# Patient Record
Sex: Male | Born: 1935 | Race: White | Hispanic: No | Marital: Married | Smoking: Former smoker
Health system: Southern US, Community
[De-identification: ages and names within clinical notes are randomized; demographics above are authoritative.]

## PROBLEM LIST (undated history)

## (undated) DIAGNOSIS — I340 Nonrheumatic mitral (valve) insufficiency: Secondary | ICD-10-CM

## (undated) DIAGNOSIS — I219 Acute myocardial infarction, unspecified: Secondary | ICD-10-CM

## (undated) DIAGNOSIS — E785 Hyperlipidemia, unspecified: Secondary | ICD-10-CM

## (undated) DIAGNOSIS — I255 Ischemic cardiomyopathy: Secondary | ICD-10-CM

## (undated) DIAGNOSIS — I2510 Atherosclerotic heart disease of native coronary artery without angina pectoris: Secondary | ICD-10-CM

## (undated) DIAGNOSIS — C9000 Multiple myeloma not having achieved remission: Secondary | ICD-10-CM

## (undated) DIAGNOSIS — E119 Type 2 diabetes mellitus without complications: Secondary | ICD-10-CM

## (undated) HISTORY — PX: AORTIC VALVE REPLACEMENT (AVR)/CORONARY ARTERY BYPASS GRAFTING (CABG): SHX5725

## (undated) HISTORY — PX: CORONARY ANGIOPLASTY WITH STENT PLACEMENT: SHX49

---

## 2005-03-04 ENCOUNTER — Encounter: Payer: Self-pay | Admitting: Cardiology

## 2005-03-29 ENCOUNTER — Encounter: Payer: Self-pay | Admitting: Cardiology

## 2005-04-29 ENCOUNTER — Encounter: Payer: Self-pay | Admitting: Cardiology

## 2005-05-29 ENCOUNTER — Other Ambulatory Visit: Payer: Self-pay

## 2005-05-29 ENCOUNTER — Emergency Department: Payer: Self-pay | Admitting: Emergency Medicine

## 2005-05-29 ENCOUNTER — Encounter: Payer: Self-pay | Admitting: Cardiology

## 2005-05-31 ENCOUNTER — Ambulatory Visit: Payer: Self-pay | Admitting: Internal Medicine

## 2005-06-04 ENCOUNTER — Ambulatory Visit: Payer: Self-pay | Admitting: Cardiology

## 2005-06-04 ENCOUNTER — Other Ambulatory Visit: Payer: Self-pay

## 2005-06-05 ENCOUNTER — Other Ambulatory Visit: Payer: Self-pay

## 2005-06-25 ENCOUNTER — Ambulatory Visit: Payer: Self-pay | Admitting: Internal Medicine

## 2005-06-29 ENCOUNTER — Encounter: Payer: Self-pay | Admitting: Cardiology

## 2005-08-06 ENCOUNTER — Ambulatory Visit (HOSPITAL_COMMUNITY): Admission: RE | Admit: 2005-08-06 | Discharge: 2005-08-07 | Payer: Self-pay | Admitting: Orthopaedic Surgery

## 2005-11-30 ENCOUNTER — Encounter: Payer: Self-pay | Admitting: Internal Medicine

## 2005-12-30 ENCOUNTER — Encounter: Payer: Self-pay | Admitting: Internal Medicine

## 2006-01-27 ENCOUNTER — Encounter: Payer: Self-pay | Admitting: Internal Medicine

## 2006-02-27 ENCOUNTER — Encounter: Payer: Self-pay | Admitting: Internal Medicine

## 2006-03-29 ENCOUNTER — Encounter: Payer: Self-pay | Admitting: Internal Medicine

## 2006-04-29 ENCOUNTER — Encounter: Payer: Self-pay | Admitting: Internal Medicine

## 2007-01-21 IMAGING — CR DG CHEST 2V
1 series · 2 of 2 positions shown · non-contrast
Comparison: none

REASON FOR EXAM: Weakness
COMMENTS:

[Series 1: view not recorded · 0.17mm/px · 2 of 2 slices shown]
[im 1/2]
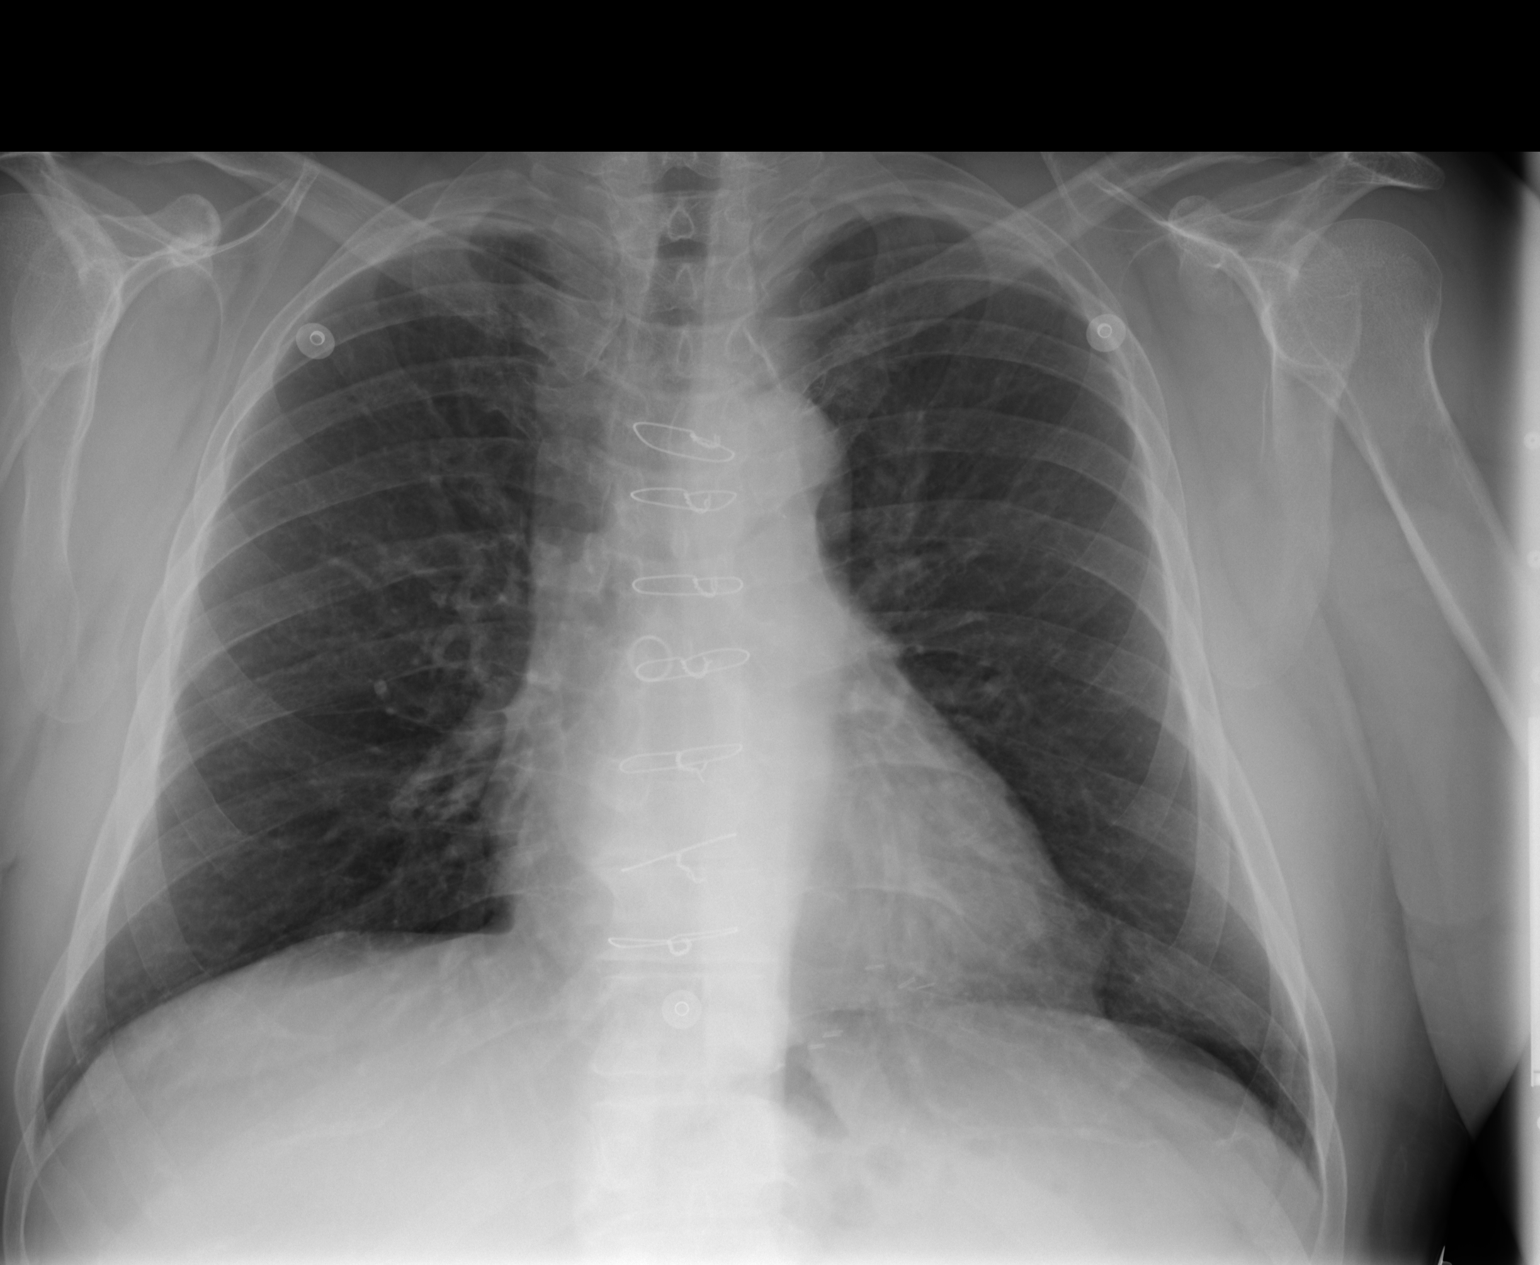
[im 2/2]
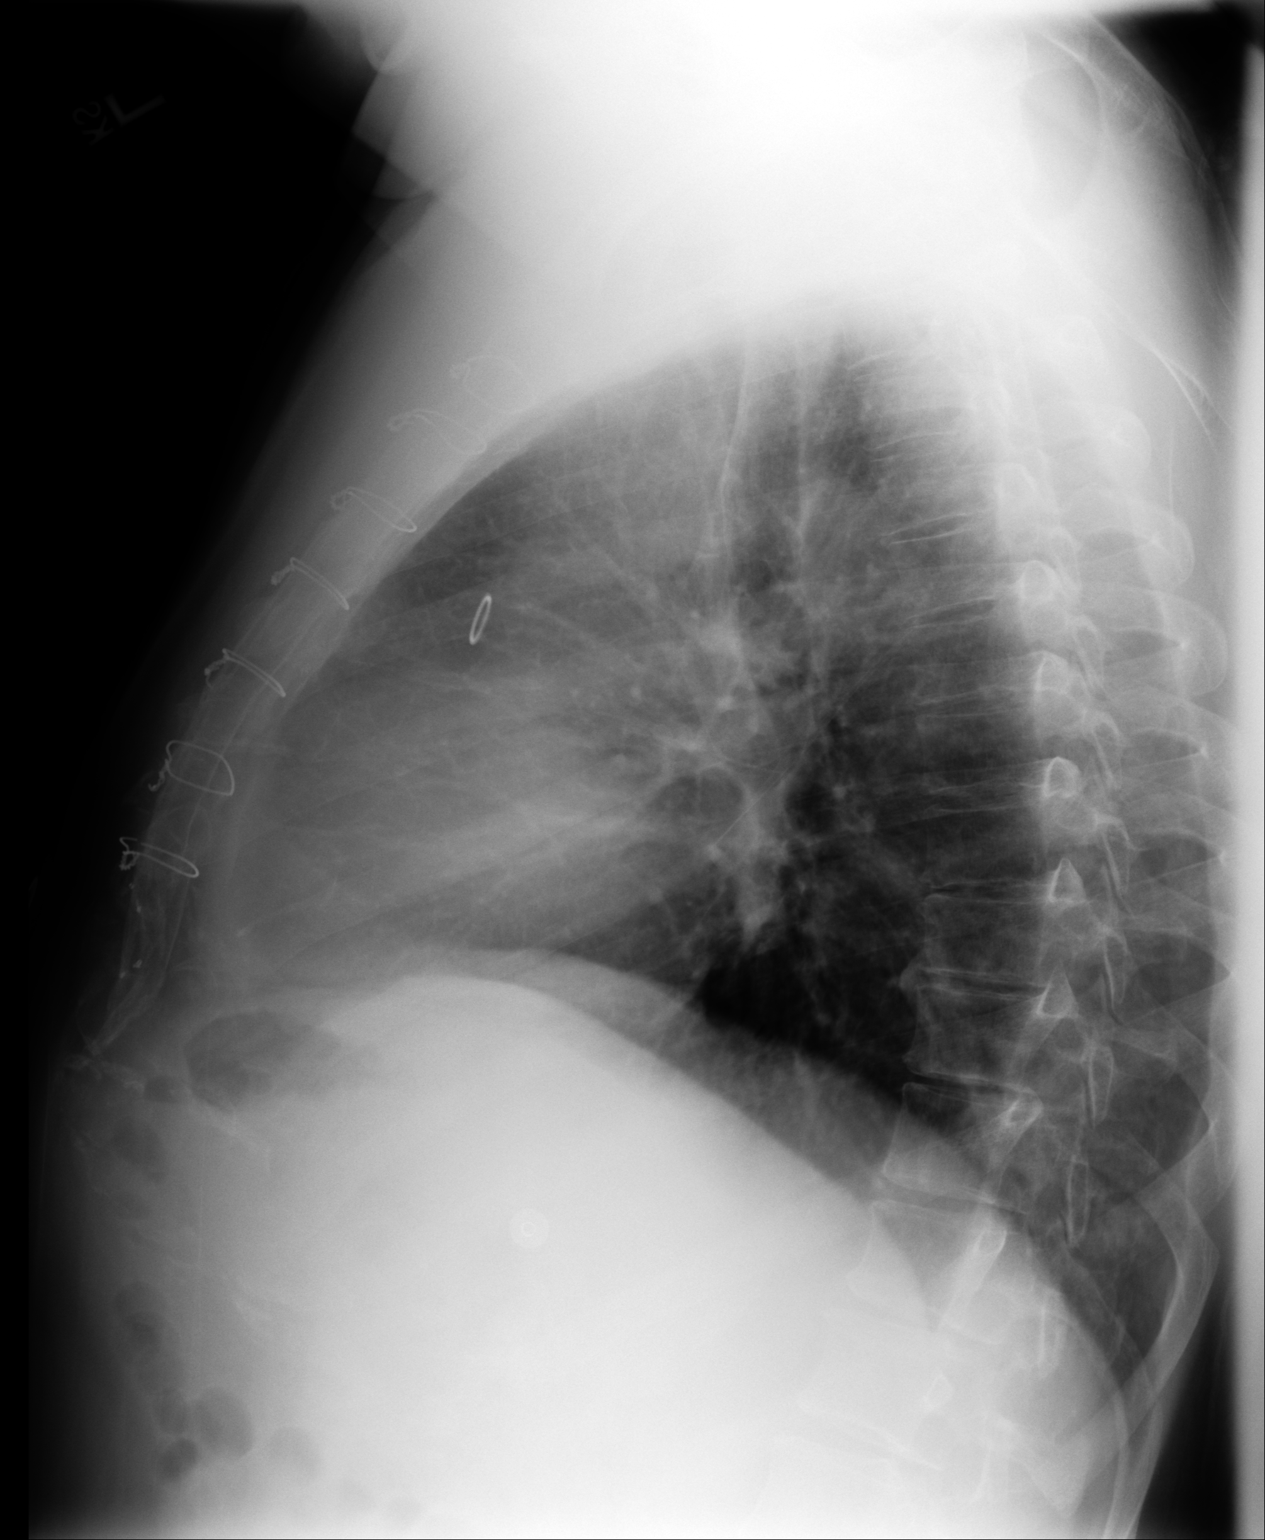

[2 of 2 positions shown; findings below may reference images not displayed]

PROCEDURE:     DXR - DXR CHEST PA (OR AP) AND LATERAL  - May 29, 2005  [DATE]

RESULT:     PA and lateral views of the chest show the lungs to be clear and
well expanded without evidence of infiltrates, effusions or mass. The
patient has undergone prior sternotomy, bypass grafting, and shows no change
since 02/05/2001.
IMPRESSION: No acute pulmonary disease.  No change since 02/05/2001.

## 2009-03-01 ENCOUNTER — Emergency Department: Payer: Self-pay | Admitting: Emergency Medicine

## 2009-03-03 ENCOUNTER — Emergency Department: Payer: Self-pay | Admitting: Emergency Medicine

## 2009-11-13 ENCOUNTER — Ambulatory Visit: Payer: Self-pay | Admitting: Gastroenterology

## 2010-12-06 ENCOUNTER — Emergency Department: Payer: Self-pay | Admitting: Emergency Medicine

## 2013-07-07 ENCOUNTER — Inpatient Hospital Stay: Payer: Self-pay | Admitting: Internal Medicine

## 2013-07-07 LAB — CBC
HCT: 37.9 % — ABNORMAL LOW (ref 40.0–52.0)
HGB: 13.8 g/dL (ref 13.0–18.0)
Platelet: 114 10*3/uL — ABNORMAL LOW (ref 150–440)
RDW: 12.9 % (ref 11.5–14.5)

## 2013-07-07 LAB — URINALYSIS, COMPLETE
Glucose,UR: NEGATIVE mg/dL (ref 0–75)
Nitrite: NEGATIVE
Ph: 6 (ref 4.5–8.0)
Protein: NEGATIVE
Specific Gravity: 1.02 (ref 1.003–1.030)
Squamous Epithelial: 1

## 2013-07-07 LAB — CK TOTAL AND CKMB (NOT AT ARMC)
CK, Total: 54 U/L (ref 35–232)
CK-MB: 0.6 ng/mL (ref 0.5–3.6)

## 2013-07-07 LAB — COMPREHENSIVE METABOLIC PANEL
Albumin: 3.2 g/dL — ABNORMAL LOW (ref 3.4–5.0)
BUN: 18 mg/dL (ref 7–18)
Calcium, Total: 8.6 mg/dL (ref 8.5–10.1)
Chloride: 104 mmol/L (ref 98–107)
Creatinine: 1.11 mg/dL (ref 0.60–1.30)
EGFR (African American): >60
EGFR (Non-African Amer.): >60
Glucose: 119 mg/dL — ABNORMAL HIGH (ref 65–99)
Osmolality: 273 (ref 275–301)
Potassium: 3.8 mmol/L (ref 3.5–5.1)
SGOT(AST): 22 U/L (ref 15–37)
SGPT (ALT): 20 U/L (ref 12–78)
Sodium: 135 mmol/L — ABNORMAL LOW (ref 136–145)
Total Protein: 7.8 g/dL (ref 6.4–8.2)

## 2013-07-07 LAB — TROPONIN I: Troponin-I: 0.64 ng/mL — ABNORMAL HIGH

## 2013-07-08 LAB — BASIC METABOLIC PANEL
Anion Gap: 9 (ref 7–16)
Calcium, Total: 8.8 mg/dL (ref 8.5–10.1)
Co2: 24 mmol/L (ref 21–32)
EGFR (Non-African Amer.): 60 — ABNORMAL LOW
Glucose: 97 mg/dL (ref 65–99)
Potassium: 3.8 mmol/L (ref 3.5–5.1)

## 2013-07-08 LAB — TROPONIN I
Troponin-I: 0.26 ng/mL — ABNORMAL HIGH
Troponin-I: 16 ng/mL — ABNORMAL HIGH

## 2013-07-08 LAB — CK TOTAL AND CKMB (NOT AT ARMC): CK-MB: 44.7 ng/mL — ABNORMAL HIGH (ref 0.5–3.6)

## 2013-07-08 LAB — FOLATE: Folic Acid: 37.3 ng/mL (ref 3.1–100.0)

## 2013-07-08 LAB — CBC WITH DIFFERENTIAL/PLATELET
Basophil #: 0.1 10*3/uL (ref 0.0–0.1)
Eosinophil #: 0 10*3/uL (ref 0.0–0.7)
Eosinophil %: 0.3 %
HCT: 37.9 % — ABNORMAL LOW (ref 40.0–52.0)
Lymphocyte %: 24.8 %
MCHC: 35.7 g/dL (ref 32.0–36.0)
Monocyte #: 0.4 x10 3/mm (ref 0.2–1.0)
Monocyte %: 4.9 %
RBC: 4.24 10*6/uL — ABNORMAL LOW (ref 4.40–5.90)
RDW: 13.1 % (ref 11.5–14.5)

## 2013-07-08 LAB — LIPID PANEL: VLDL Cholesterol, Calc: 20 mg/dL (ref 5–40)

## 2013-07-09 LAB — BASIC METABOLIC PANEL
Anion Gap: 6 — ABNORMAL LOW (ref 7–16)
BUN: 16 mg/dL (ref 7–18)
Calcium, Total: 8.3 mg/dL — ABNORMAL LOW (ref 8.5–10.1)
Chloride: 106 mmol/L (ref 98–107)
Creatinine: 1.06 mg/dL (ref 0.60–1.30)
EGFR (African American): >60
EGFR (Non-African Amer.): >60
Glucose: 104 mg/dL — ABNORMAL HIGH (ref 65–99)
Osmolality: 275 (ref 275–301)
Sodium: 137 mmol/L (ref 136–145)

## 2013-07-09 LAB — CBC WITH DIFFERENTIAL/PLATELET
Basophil #: 0 10*3/uL (ref 0.0–0.1)
Basophil %: 0.4 %
Eosinophil #: 0 10*3/uL (ref 0.0–0.7)
Eosinophil %: 0.2 %
HGB: 13.4 g/dL (ref 13.0–18.0)
Lymphocyte %: 27.9 %
MCV: 88 fL (ref 80–100)
Monocyte #: 0.9 x10 3/mm (ref 0.2–1.0)
Monocyte %: 17.4 %
RBC: 4.14 10*6/uL — ABNORMAL LOW (ref 4.40–5.90)
RDW: 13.2 % (ref 11.5–14.5)
WBC: 5.4 10*3/uL (ref 3.8–10.6)

## 2013-07-09 LAB — TROPONIN I: Troponin-I: >40 ng/mL

## 2013-07-10 DIAGNOSIS — I10 Essential (primary) hypertension: Secondary | ICD-10-CM | POA: Insufficient documentation

## 2013-07-10 DIAGNOSIS — I2510 Atherosclerotic heart disease of native coronary artery without angina pectoris: Secondary | ICD-10-CM | POA: Insufficient documentation

## 2013-07-10 LAB — CBC WITH DIFFERENTIAL/PLATELET
Bands: 7 %
Lymphocytes: 30 %
MCH: 31.9 pg (ref 26.0–34.0)
MCHC: 36.4 g/dL — ABNORMAL HIGH (ref 32.0–36.0)
MCV: 88 fL (ref 80–100)
Platelet: 93 10*3/uL — ABNORMAL LOW (ref 150–440)
RBC: 4.32 10*6/uL — ABNORMAL LOW (ref 4.40–5.90)
Segmented Neutrophils: 50 %
Variant Lymphocyte - H1-Rlymph: 5 %
WBC: 5 10*3/uL (ref 3.8–10.6)

## 2013-07-10 LAB — BASIC METABOLIC PANEL WITH GFR
Anion Gap: 3 — ABNORMAL LOW
BUN: 16 mg/dL
Calcium, Total: 8.2 mg/dL — ABNORMAL LOW
Chloride: 106 mmol/L
Co2: 27 mmol/L
Creatinine: 0.97 mg/dL
EGFR (African American): >60
EGFR (Non-African Amer.): >60
Glucose: 106 mg/dL — ABNORMAL HIGH
Osmolality: 274
Potassium: 4.2 mmol/L
Sodium: 136 mmol/L

## 2013-07-12 LAB — CULTURE, BLOOD (SINGLE)

## 2013-08-27 ENCOUNTER — Encounter: Payer: Self-pay | Admitting: Cardiology

## 2013-08-29 ENCOUNTER — Encounter: Payer: Self-pay | Admitting: Cardiology

## 2013-09-29 ENCOUNTER — Encounter: Payer: Self-pay | Admitting: Cardiology

## 2013-10-29 ENCOUNTER — Encounter: Payer: Self-pay | Admitting: Cardiology

## 2014-05-09 DIAGNOSIS — C61 Malignant neoplasm of prostate: Secondary | ICD-10-CM | POA: Insufficient documentation

## 2014-09-06 DIAGNOSIS — R0609 Other forms of dyspnea: Secondary | ICD-10-CM

## 2014-10-22 ENCOUNTER — Encounter: Payer: Self-pay | Admitting: Cardiology

## 2014-10-29 ENCOUNTER — Encounter: Payer: Self-pay | Admitting: Cardiology

## 2014-11-29 ENCOUNTER — Encounter: Payer: Self-pay | Admitting: Cardiology

## 2014-12-30 ENCOUNTER — Encounter: Payer: Self-pay | Admitting: Cardiology

## 2015-01-28 ENCOUNTER — Encounter
Admit: 2015-01-28 | Disposition: A | Discharge status: Home / Self Care | Payer: Self-pay | Attending: Cardiology | Admitting: Cardiology

## 2015-02-28 ENCOUNTER — Encounter
Admit: 2015-02-28 | Disposition: A | Discharge status: Home / Self Care | Payer: Self-pay | Attending: Cardiology | Admitting: Cardiology

## 2015-03-21 NOTE — Discharge Summary (Signed)
PATIENT NAME:  Dwayne Terry, Dwayne Terry MR#:  [id number] DATE OF BIRTH:  July 12, 1930  DATE OF ADMISSION:  07/07/2013 DATE OF DISCHARGE:    DISCHARGE DIAGNOSES:  1.  Acute myocardial infarction.  2.  Ostial right coronary artery lesion, complicated.  3.  Encephalopathy from above and/or fever.  4.  Fever, possibly associated illness with a normal white blood cell count and thrombocytopenia.   SUGGESTED DISCHARGE MEDICATIONS: Tylenol p.r.n., aspirin 325 daily, metoprolol XL 12.5 mg daily,  Plavix 75 mg daily, lovastatin 10 mg at bedtime as he has not tolerated high doses of statins in the past, doxycycline 100 mg p.o. q.12 hours for 5 days.  HISTORY AND PHYSICAL: Please see detailed history of this done on admission.   HOSPITAL COURSE: The patient was admitted confused with chest pain and fever. He ruled in for myocardial infarction with his troponins reaching a height of greater than 40 early August 11 morning. He initially was given ceftriaxone and levofloxacin, though his fever persisted. T-max in the hospital was 100.3. Notably, his fever could be all from his myocardial event as well. Cardiology consultation was obtained. Cardiac catheterization was done on Monday, August 11. Per Dr. Nehemiah Massed notes, he had ejection fraction 35% with segmental dysfunction, patent graft to the LAD and marginal artery from his history of CABG. The right proximal coronary artery was stenosed. This area had been previously stented, but not grafted with a bypass. The interventional cardiologist here did not feel this was a minimal lesion to be done in our medical center without cardiovascular surgery back up, so transfer has been arranged to [facility] for further management of the above lesion.   TIME SPENT: It took approximately 35 minutes to do all discharge tasks today.   ____________________________ Ocie Cornfield. Ouida Sills, MD mwa:aw D: 07/10/2013 07:56:38 ET T: 07/10/2013 08:11:58  ET JOB#: [id number]  cc: Ocie Cornfield. Ouida Sills, MD, <Dictator> Kirk Ruths MD ELECTRONICALLY SIGNED 07/13/2013 7:26

## 2015-03-21 NOTE — Consult Note (Signed)
PATIENT NAME:  Dwayne Terry, BULGER MR#:  [id number] DATE OF BIRTH:  29-Oct-1930  DATE OF CONSULTATION:  07/08/2013  REFERRING PHYSICIAN:  Dr. Posey Pronto.  CONSULTING PHYSICIAN:  Corey Skains, MD  REASON FOR CONSULTATION: Acute subendocardial myocardial infarction, hypertension, hyperlipidemia, coronary artery bypass surgery and old myocardial infarction with altered mental status.   CHIEF COMPLAINT: The patient has altered mental status and unable to converse at this time.   HISTORY OF PRESENT ILLNESS: This is an 81 year old male with known coronary artery disease status post previous myocardial infarction, coronary artery bypass graft in the past. He has had hypertension and hyperlipidemia on appropriate medication time at this time without evidence of significant side effects. The patient has had recent altered mental status, weakness, fatigue and chest discomfort with shortness of breath requiring admission to the hospital. During that period of time, he has had minimal elevation of his troponin, possibly consistent with minimal subendocardial myocardial infarction. EKG has shown sinus bradycardia with nonspecific ST and T wave changes. The patient currently does not have any apparent significant chest discomfort, though has had waxing and waning of chest discomfort throughout the evening. The patient has had no chest pain today, and telemetry shows sinus bradycardia.   REVIEW OF SYSTEMS: Negative for syncope, dizziness, nausea, diaphoresis, weakness, fatigue, bleeding complications, frequent urination, urination at night, muscle weakness, numbness, anxiety, depression, skin lesions, skin rashes.   PAST MEDICAL HISTORY:  1. Old myocardial infarction.  2. Coronary artery bypass surgery.  3. Hypertension.  4. Hyperlipidemia.   FAMILY HISTORY: Brother has had early onset of cardiovascular disease or hypertension.   SOCIAL HISTORY: He still has tobacco abuse and denies alcohol use.   ALLERGIES: As  listed.   MEDICATIONS: As listed.   PHYSICAL EXAMINATION:  VITAL SIGNS: Blood pressure is 126/68 bilaterally. Heart rate 62 upright, reclining and regular.  GENERAL: He is a well-appearing male in no acute distress.  HEENT: No icterus, thyromegaly, ulcers, hemorrhage or xanthelasma.  CARDIOVASCULAR: Regular rate and rhythm. Normal S1 and S2 with no apparent murmur, gallop or rub. PMI is diffuse. Carotid upstroke normal without bruit. Jugular venous pressure is normal.  LUNGS: Have few basilar crackles with normal respirations.  ABDOMEN: Soft, nontender, without hepatosplenomegaly or masses. Abdominal aorta is normal size without bruit.  EXTREMITIES: There are 2+ bilateral pulses in the dorsal pedal, radial and femoral arteries without lower extremity edema, cyanosis, clubbing or ulcers.  NEUROLOGIC: He is not oriented to time, place and/or person due to some mental status changes.  ASSESSMENT: An 81 year old male with hypertension, hyperlipidemia, coronary artery disease status post coronary artery bypass graft and old myocardial infarction with elevated troponin, bradycardia and chest pain consistent with subendocardial myocardial infarction.   RECOMMENDATIONS:  1. Continue serial ECG and enzymes to assess for the possibility of extensive myocardial infarction.  2. Echocardiogram for LV dysfunction, valvular heart disease extent.  3. Heparin, aspirin, nitroglycerin for acute myocardial infarction and stabilization.  4. Proceed to cardiac catheterization to assess coronary anatomy and further treatment thereof as necessary. This includes the possibility of death, stroke, heart attack, infection, bleeding. He is at low risk for conscious sedation.  5. Further treatment options after above with stabilization and continue medication management of risk factors of cardiovascular disease, including hypertension and heart rate control.   ____________________________ Corey Skains,  MD bjk:gb D: 07/08/2013 20:14:02 ET T: 07/08/2013 22:00:14 ET JOB#: [id number]  cc: Corey Skains, MD, <Dictator> Corey Skains MD ELECTRONICALLY SIGNED 07/09/2013 7:31

## 2015-03-21 NOTE — H&P (Signed)
PATIENT NAME:  Dwayne Terry, Dwayne Terry MR#:  [id number] DATE OF BIRTH:  08-20-1930  DATE OF ADMISSION:  07/07/2013  PRIMARY CARE PHYSICIAN:  Dr. Frazier Richards   CARDIOLOGIST:  Dr. Cristie Hem  CODE STATUS:  DNI.  CHIEF COMPLAINT:  Altered mentation.   HISTORY OF PRESENT ILLNESS: This is a pleasant, 81 year old gentleman who was brought in with complaints of worsening altered mentation. Apparently on Wednesday, he was out cutting on the tree and ever since then he has been off some. He initially had some nausea and vomiting, which has since resolved. However, he has been weak, developing worsening confusion. There are no complaints of chest pains. However, today in the ER, his troponin was somewhat elevated. He has a cough, but it is nonproductive. There are no reports of any fevers or any chills or burning on urination. Today he was quite dizzy and staggering, with worsening altered mentation. Therefore, he was advised to come to the ER. History provided by the patient as well as family members who are present at the bedside. The patient reports no burning on urination. No symptomatology of infections, except for the cough, which is nonproductive. He reports no significant pain.   REVIEW OF SYSTEMS: As best able to examine, the 10-point system is reviewed and negative, except as listed in the HPI.    PAST MEDICAL HISTORY: Includes coronary artery disease, hypertension, dyslipidemia, diabetes mellitus and a history of melanoma.   PAST SURGICAL HISTORY:  Includes CABG.   MEDICATIONS: Include Plavix 75 mg daily, atenolol 25 mg daily, aspirin 81 mg daily, simvastatin 20 mg daily.   ALLERGIES:  No known drug allergies.   SOCIAL HISTORY:  Negative tobacco, alcohol or illicit drugs. The patient does not use a cane or walker.   FAMILY HISTORY:  Significant for coronary artery disease.   PHYSICAL EXAMINATION: VITAL SIGNS:  Blood pressure 119/58, pulse 59, respirations 26, satting 98.4% on room air.   GENERAL:   Alert and oriented x 3, but also pleasantly confused.  EYES:  Pink conjunctivae, PERRLA.  EARS, NOSE, THROAT:  Moist oral mucosa. Trachea midline.  NECK:  Supple.  LUNGS:  Clear. No wheeze. No use of accessory muscles.  CARDIOVASCULAR: Regular rate and rhythm, without murmurs, rubs or gallops.  No JVD.  ABDOMEN:  Soft. Positive bowel sounds. Nontender, nondistended. No organomegaly.  NEUROLOGIC:  Cranial nerves II through XII grossly intact. Sensation intact.  MUSCULOSKELETAL:  Strength 5/5 in all extremities. No clubbing, cyanosis or edema.  SKIN: No rashes. No subcutaneous crepitation.   LABS:  Urinalysis shows 3+ leukocyte esterase, less than 1 white blood cells, no bacteria seen. CT head without contrast shows no acute intracranial abnormality, mild age-related changes are present bilaterally. CK 54, CK-MB 0.6. Troponin mildly elevated at 0.64. Sodium 135, potassium 3.8, chloride 104, CO2 of 27, BUN 18, creatinine 1.1, glucose 119. LFTs are normal. White blood count 9.6, hemoglobin 13.8, platelets 114, glucose 118. EKG shows normal sinus rhythm with no significant ST segment changes.   ASSESSMENT AND PLAN: 1.  Altered mentation. Unclear etiology. The patient's urinalysis is weakly positive for possible urinary tract infection. Will go ahead and make sure cultures are sent. Will start patient on Rocephin. Will also order chest x-ray, looking for any other source of infection, given the patient's complaints of cough. I will also add an RPR, TSH, folate and vitamin B12 levels just looking for all the occult causes for confusion.   2.  Elevated troponins without any symptoms of chest pains.  Will go ahead and check cardiac enzymes, and place patient on aspirin. No further work up will be pursued at this point. He will be monitored on telemetry. Will get a lipid panel for the morning. Dr. Belinda Fisher from [facility] is aware, and states that he will see the patient in the  morning.  3.  Hypertension.   4.  Dyslipidemia.   5.  Diabetes mellitus.   The patient will be on an ADA diet and sliding scale insulin. Resume home medications.     ____________________________ Quintella Baton, MD dc:mr D: 07/07/2013 19:37:00 ET T: 07/07/2013 20:16:51 ET JOB#: [id number]  cc: Quintella Baton, MD, <Dictator> Quintella Baton MD ELECTRONICALLY SIGNED 07/08/2013 17:54

## 2015-04-07 ENCOUNTER — Encounter: Payer: Medicare Other | Attending: Cardiology | Admitting: *Deleted

## 2015-04-07 DIAGNOSIS — Z9861 Coronary angioplasty status: Secondary | ICD-10-CM | POA: Diagnosis present

## 2015-04-07 DIAGNOSIS — I214 Non-ST elevation (NSTEMI) myocardial infarction: Secondary | ICD-10-CM | POA: Insufficient documentation

## 2015-04-07 NOTE — Progress Notes (Signed)
Daily Session Note  Patient Details  Name: Dwayne Terry MRN: [id number] Date of Birth: Sep 16, 1930 Referring Provider:  [name], MD  Encounter Date: 04/07/2015  Check In:     Session Check In - 04/07/15 1338    Check-In   Staff Present Heath Lark RN, BSN, CCRP;Shedric Fredericks BS, ACSM CEP Exercise Physiologist;Renee [place] MS, ACSM CEP Exercise Physiologist   ER physicians immediately available to respond to emergencies See telemetry face sheet for immediately available ER MD   Warm-up and Cool-down Performed on first and last piece of equipment   VAD Patient? No   Pain Assessment   Currently in Pain? No/denies   Multiple Pain Sites No         Goals Met:  Proper associated with RPD/PD & O2 Sat Independence with exercise equipment Exercise tolerated well  Goals Unmet:  Not Applicable  Goals Comments: Patient had new medications but did not know what they were. He will bring an updated list with him to his next session.    Dr. Emily Filbert is Medical Director for [place] and [facility].

## 2015-04-09 DIAGNOSIS — Z9861 Coronary angioplasty status: Secondary | ICD-10-CM

## 2015-04-09 DIAGNOSIS — I214 Non-ST elevation (NSTEMI) myocardial infarction: Secondary | ICD-10-CM | POA: Diagnosis not present

## 2015-04-09 NOTE — Progress Notes (Signed)
Daily Session Note  Patient Details  Name: Dwayne Terry MRN: [id number] Date of Birth: 01-02-1930 Referring Provider:  [name], MD  Encounter Date: 04/09/2015  Check In:     Session Check In - 04/09/15 0841    Check-In   Staff Present Heath Lark RN, BSN, CCRP;Everline Mahaffy BS, ACSM EP-C, Exercise Physiologist;Renee Dillard [place] MS, ACSM CEP Exercise Physiologist   ER physicians immediately available to respond to emergencies See telemetry face sheet for immediately available ER MD   Medication changes reported     No   Fall or balance concerns reported    No   Warm-up and Cool-down Performed on first and last piece of equipment   VAD Patient? No   Pain Assessment   Currently in Pain? No/denies         Goals Met:  Proper associated with RPD/PD & O2 Sat Exercise tolerated well No report of cardiac concerns or symptoms  Goals Unmet:  Not Applicable  Goals Comments:    Dr. Emily Filbert is Medical Director for [place] and [facility].

## 2015-04-11 ENCOUNTER — Encounter: Payer: Medicare Other | Admitting: *Deleted

## 2015-04-11 DIAGNOSIS — Z9861 Coronary angioplasty status: Secondary | ICD-10-CM

## 2015-04-11 DIAGNOSIS — I214 Non-ST elevation (NSTEMI) myocardial infarction: Secondary | ICD-10-CM | POA: Diagnosis not present

## 2015-04-11 NOTE — Progress Notes (Signed)
Daily Session Note  Patient Details  Name: QUIRINO KAKOS MRN: [id number] Date of Birth: 02-Jun-1930 Referring Provider:  [name], MD  Encounter Date: 04/11/2015  Check In:     Session Check In - 04/11/15 0903    Check-In   Staff Present Heath Lark RN, BSN, CCRP;Carroll Enterkin RN, Drusilla Kanner MS, ACSM CEP Exercise Physiologist   ER physicians immediately available to respond to emergencies See telemetry face sheet for immediately available ER MD   Medication changes reported     No   Fall or balance concerns reported    No   Warm-up and Cool-down Performed on first and last piece of equipment   VAD Patient? No   Pain Assessment   Currently in Pain? No/denies         Goals Met:  Independence with exercise equipment Exercise tolerated well No report of cardiac concerns or symptoms  Goals Unmet:  Not Applicable  Goals Comments: Rush Landmark had to slow down for shortness of breath. Able to resume exercise without further complaint.   Dr. Emily Filbert is Medical Director for [place] and [facility].

## 2015-04-14 ENCOUNTER — Encounter: Payer: Medicare Other | Admitting: *Deleted

## 2015-04-14 DIAGNOSIS — I214 Non-ST elevation (NSTEMI) myocardial infarction: Secondary | ICD-10-CM

## 2015-04-14 DIAGNOSIS — Z9861 Coronary angioplasty status: Secondary | ICD-10-CM

## 2015-04-14 NOTE — Progress Notes (Signed)
Daily Session Note  Patient Details  Name: Dwayne Terry MRN: [id number] Date of Birth: [birth year] Referring Provider:  [name], MD  Encounter Date: 04/14/2015  Check In:     Session Check In - 04/14/15 0904    Check-In   Staff Present Heath Lark RN, BSN, CCRP;Kelly Hayes BS, ACSM CEP Exercise Physiologist;Keylen Eckenrode Dillard [place] MS, ACSM CEP Exercise Physiologist   ER physicians immediately available to respond to emergencies See telemetry face sheet for immediately available ER MD   Medication changes reported     No   Fall or balance concerns reported    No   Warm-up and Cool-down Performed on first and last piece of equipment   VAD Patient? No   Pain Assessment   Currently in Pain? No/denies   Multiple Pain Sites No         Goals Met:  Independence with exercise equipment Exercise tolerated well No report of cardiac concerns or symptoms  Goals Unmet:  Not Applicable  Goals Comments:    Dr. Emily Filbert is Medical Director for [place] and [facility].

## 2015-04-18 ENCOUNTER — Telehealth: Payer: Self-pay | Admitting: *Deleted

## 2015-04-18 NOTE — Telephone Encounter (Signed)
Dwayne Terry called to say he could not attend Cardiac Rehab today since he has the Flu.

## 2015-04-20 ENCOUNTER — Encounter: Payer: Self-pay | Admitting: *Deleted

## 2015-04-20 NOTE — Progress Notes (Signed)
Documentation from previous EMR to update the Individualized Treatment Plan in [facility].

## 2015-04-21 ENCOUNTER — Encounter: Payer: Medicare Other | Admitting: *Deleted

## 2015-04-21 DIAGNOSIS — Z9861 Coronary angioplasty status: Secondary | ICD-10-CM

## 2015-04-21 DIAGNOSIS — I214 Non-ST elevation (NSTEMI) myocardial infarction: Secondary | ICD-10-CM | POA: Diagnosis not present

## 2015-04-21 NOTE — Progress Notes (Signed)
Daily Session Note  Patient Details  Name: Dwayne Terry MRN: [id number] Date of Birth: 1930-01-19 Referring Provider:  [name], MD  Encounter Date: 04/21/2015  Check In:     Session Check In - 04/21/15 0841    Check-In   Staff Present Earlean Shawl BS, ACSM CEP Exercise Physiologist;Renee Dillard [place] MS, ACSM CEP Exercise Physiologist;Susanne Bice RN, BSN, CCRP   ER physicians immediately available to respond to emergencies See telemetry face sheet for immediately available ER MD   Medication changes reported     No   Fall or balance concerns reported    No   Warm-up and Cool-down Performed on first and last piece of equipment   VAD Patient? No   Pain Assessment   Currently in Pain? No/denies   Multiple Pain Sites No           Exercise Prescription Changes - 04/21/15 0800    Treadmill   MPH 2.7   Grade 0   Recumbant Elliptical   Level 7   RPM 40      Goals Met:  Independence with exercise equipment Exercise tolerated well No report of cardiac concerns or symptoms  Goals Unmet:  Not Applicable  Goals Comments:    Dr. Emily Filbert is Medical Director for [place] and [facility].

## 2015-04-22 ENCOUNTER — Encounter: Payer: Self-pay | Admitting: *Deleted

## 2015-04-22 NOTE — Progress Notes (Signed)
Cardiac Individual Treatment Plan  Patient Details  Name: Dwayne Terry MRN: [id number] Date of Birth: 1930/10/01 Referring Provider:  Dr. Arloa Terry Initial Encounter Date:  10/22/2014 Diagnosis AMI  Patient's Home Medications on Admission: No current outpatient prescriptions on file.  Past Medical History: No past medical history on file.  Tobacco Use: History  Smoking status  . Not on file  Smokeless tobacco  . Not on file    Labs: Recent Review Flowsheet Data    There is no flowsheet data to display.       Exercise Target Goals:    Exercise Program Goal: Individual exercise prescription set with THRR, safety & activity barriers. Participant demonstrates ability to understand and report RPE using BORG scale, to self-measure pulse accurately, and to acknowledge the importance of the exercise prescription.  Exercise Prescription Goal: Starting with aerobic activity 30 plus minutes a day, 3 days per week for initial exercise prescription. Provide home exercise prescription and guidelines that participant acknowledges understanding prior to discharge.  Activity Barriers & Risk Stratification:   6 Minute Walk:   Initial Exercise Prescription:   Exercise Prescription Changes:     Exercise Prescription Changes      04/21/15 0800 04/22/15 0800         Exercise Review   Progression  Yes      Response to Exercise   Blood Pressure (Admit)  110/60 mmHg      Blood Pressure (Exercise)  134/78 mmHg      Blood Pressure (Exit)  122/80 mmHg      Heart Rate (Admit)  57 bpm      Heart Rate (Exercise)  88 bpm      Heart Rate (Exit)  59 bpm      Rating of Perceived Exertion (Exercise)  13      Frequency  Add 1 additional day to program exercise sessions.      Duration  Progress to 30 minutes of continuous aerobic without signs/symptoms of physical distress      Intensity  THRR unchanged      Progression  Continue progressive overload as per policy without  signs/symptoms or physical distress.      Resistance Training   Training Prescription  Yes      Weight  2      Reps  10-12      Interval Training   Interval Training  No      Treadmill   MPH 2.7 2.7      Grade 0 0      Minutes  15      Arm/Foot Ergometer   Level  4      Watts  20      Minutes  15      Recumbant Elliptical   Level 7 7      RPM 40 40      Minutes  15      REL-XR   Level  4      Watts  60      Minutes  15         Discharge Exercise Prescription:   Nutrition:  Target Goals: Understanding of nutrition guidelines, daily intake of sodium 1500mg , cholesterol 200mg , calories 30% from fat and 7% or less from saturated fats, daily to have 5 or more servings of fruits and vegetables.  Biometrics:    Nutrition Therapy Plan and Nutrition Goals:     Nutrition Therapy & Goals - 04/20/15 1248    Nutrition Therapy  Diet DASH 1600 calorie   Drug/Food Interactions Statins/Certain Fruits   Fiber 30 grams   Whole Grain Foods 3 servings   Protein 8 ounces/day   Saturated Fats 11 max. grams   Fruits and Vegetables 5 servings/day   Personal Nutrition Goals   Personal Goal #1 Include a minimum of 7 oz protein food per day /refer to given list   Personal Goal #2 Include at least 2 servings fruit/day. Refer to list given for amount that is a serving   Personal Goal #3 Read labels for saturated and trans fats and sodium   Intervention Plan   Intervention Using nutrition plan and personal goals to gain a healthy nutrition lifestyle. Add exercise as prescribed.      Nutrition Discharge: Rate Your Plate Scores:   Nutrition Goals Re-Evaluation:     Nutrition Goals Re-Evaluation      04/21/15 0921           Personal Goal #1 Re-Evaluation   Personal Goal #1 Dwayne Terry reported Dietician encourage him to eat more fruit. He puts blueberries on his cereal now and eats fruit as a dessert.       Goal Progress Seen Yes          Psychosocial: Target Goals: Acknowledge  presence or absence of depression, maximize coping skills, provide positive support system. Participant is able to verbalize types and ability to use techniques and skills needed for reducing stress and depression.  Initial Review & Psychosocial Screening:   Quality of Life Scores:   PHQ-9:     Recent Review Flowsheet Data    There is no flowsheet data to display.      Psychosocial Evaluation and Intervention:   Psychosocial Re-Evaluation:   Vocational Rehabilitation: Provide vocational rehab assistance to qualifying candidates.   Vocational Rehab Evaluation & Intervention:     Vocational Rehab - 04/20/15 1247    Initial Vocational Rehab Evaluation & Intervention   Assessment shows need for Vocational Rehabilitation No      Education: Education Goals: Education classes will be provided on a weekly basis, covering required topics. Participant will state understanding/return demonstration of topics presented.  Learning Barriers/Preferences:   Education Topics: General Nutrition Guidelines/Fats and Fiber: -Group instruction provided by verbal, written material, models and posters to present the general guidelines for heart healthy nutrition. Gives an explanation and review of dietary fats and fiber.   Controlling Sodium/Reading Food Labels: -Group verbal and written material supporting the discussion of sodium use in heart healthy nutrition. Review and explanation with models, verbal and written materials for utilization of the food label.   Exercise Physiology & Risk Factors: - Group verbal and written instruction with models to review the exercise physiology of the cardiovascular system and associated critical values. Details cardiovascular disease risk factors and the goals associated with each risk factor.   Aerobic Exercise & Resistance Training: - Gives group verbal and written discussion on the health impact of inactivity. On the components of aerobic and  resistive training programs and the benefits of this training and how to safely progress through these programs.   Flexibility, Balance, General Exercise Guidelines: - Provides group verbal and written instruction on the benefits of flexibility and balance training programs. Provides general exercise guidelines with specific guidelines to those with heart or lung disease. Demonstration and skill practice provided.   Stress Management: - Provides group verbal and written instruction about the health risks of elevated stress, cause of high stress, and healthy ways to reduce stress.  Depression: - Provides group verbal and written instruction on the correlation between heart/lung disease and depressed mood, treatment options, and the stigmas associated with seeking treatment.   Anatomy & Physiology of the Heart: - Group verbal and written instruction and models provide basic cardiac anatomy and physiology, with the coronary electrical and arterial systems. Review of: AMI, Angina, Valve disease, Heart Failure, Cardiac Arrhythmia, Pacemakers, and the ICD.   Cardiac Procedures: - Group verbal and written instruction and models to describe the testing methods done to diagnose heart disease. Reviews the outcomes of the test results. Describes the treatment choices: Medical Management, Angioplasty, or Coronary Bypass Surgery.   Cardiac Medications: - Group verbal and written instruction to review commonly prescribed medications for heart disease. Reviews the medication, class of the drug, and side effects. Includes the steps to properly store meds and maintain the prescription regimen.   Go Sex-Intimacy & Heart Disease, Get SMART - Goal Setting: - Group verbal and written instruction through game format to discuss heart disease and the return to sexual intimacy. Provides group verbal and written material to discuss and apply goal setting through the application of the S.M.A.R.T.  Method.   Other Matters of the Heart: - Provides group verbal, written materials and models to describe Heart Failure, Angina, Valve Disease, and Diabetes in the realm of heart disease. Includes description of the disease process and treatment options available to the cardiac patient.   Exercise & Equipment Safety: - Individual verbal instruction and demonstration of equipment use and safety with use of the equipment.   Infection Prevention: - Provides verbal and written material to individual with discussion of infection control including proper hand washing and proper equipment cleaning during exercise session.   Falls Prevention: - Provides verbal and written material to individual with discussion of falls prevention and safety.   Diabetes: - Individual verbal and written instruction to review signs/symptoms of diabetes, desired ranges of glucose level fasting, after meals and with exercise. Advice that pre and post exercise glucose checks will be done for 3 sessions at entry of program.    Knowledge Questionnaire Score:   Personal Goals and Risk Factors at Admission:     Personal Goals and Risk Factors at Admission - 04/20/15 1251    Personal Goals and Risk Factors on Admission    Weight Management Yes   Intervention Learn and follow the exercise and diet guidelines while in the program. Utilize the nutrition and education classes to help gain knowledge of the diet and exercise expectations in the program   Increase Aerobic Exercise and Physical Activity Yes   Intervention While in program, learn and follow the exercise prescription taught. Start at a low level workload and increase workload after able to maintain previous level for 30 minutes. Increase time before increasing intensity.   Diabetes No   Hypertension Yes   Goal Participant will see blood pressure controlled within the values of 140/19mm/Hg or within value directed by their physician.   Intervention Provide  nutrition & aerobic exercise along with prescribed medications to achieve BP 140/90 or less.   Lipids Yes   Goal Cholesterol controlled with medications as prescribed, with individualized exercise RX and with personalized nutrition plan. Value goals: LDL < 70mg , HDL > 40mg . Participant states understanding of desired cholesterol values and following prescriptions.   Intervention Provide nutrition & aerobic exercise along with prescribed medications to achieve LDL 70mg , HDL >40mg .   Stress No      Personal Goals and Risk Factors  Review:      Goals and Risk Factor Review      04/21/15 0922           Increase Aerobic Exercise and Physical Activity   Goals Progress/Improvement seen  Yes       Comments Dwayne Terry reports he is glad to be back in Cardiac Rehab exercising since it helps his mood and energy.          Personal Goals Discharge:     Comments:30 day review

## 2015-04-24 ENCOUNTER — Other Ambulatory Visit: Payer: Self-pay | Admitting: *Deleted

## 2015-04-24 DIAGNOSIS — I214 Non-ST elevation (NSTEMI) myocardial infarction: Secondary | ICD-10-CM

## 2015-04-24 DIAGNOSIS — Z9861 Coronary angioplasty status: Secondary | ICD-10-CM

## 2015-04-30 ENCOUNTER — Encounter: Payer: Medicare Other | Attending: Cardiology

## 2015-04-30 DIAGNOSIS — I214 Non-ST elevation (NSTEMI) myocardial infarction: Secondary | ICD-10-CM | POA: Insufficient documentation

## 2015-04-30 DIAGNOSIS — Z9861 Coronary angioplasty status: Secondary | ICD-10-CM | POA: Insufficient documentation

## 2015-05-02 ENCOUNTER — Encounter: Payer: Medicare Other | Admitting: *Deleted

## 2015-05-02 DIAGNOSIS — I214 Non-ST elevation (NSTEMI) myocardial infarction: Secondary | ICD-10-CM | POA: Diagnosis not present

## 2015-05-02 DIAGNOSIS — Z9861 Coronary angioplasty status: Secondary | ICD-10-CM

## 2015-05-02 NOTE — Progress Notes (Signed)
Daily Session Note  Patient Details  Name: Dwayne Terry MRN: [id number] Date of Birth: 20-Nov-1930 Referring Provider:  [name], MD  Encounter Date: 05/02/2015  Check In:     Session Check In - 05/02/15 0954    Check-In   Staff Present Heath Lark RN, BSN, CCRP;Carroll Enterkin RN, Drusilla Kanner MS, ACSM CEP Exercise Physiologist   ER physicians immediately available to respond to emergencies See telemetry face sheet for immediately available ER MD   Medication changes reported     No   Fall or balance concerns reported    No   Warm-up and Cool-down Performed on first and last piece of equipment   VAD Patient? No   Pain Assessment   Currently in Pain? No/denies   Multiple Pain Sites No         Goals Met:  Independence with exercise equipment Exercise tolerated well No report of cardiac concerns or symptoms Strength training completed today  Goals Unmet:  Not Applicable  Goals Comments:     Dr. Emily Filbert is Medical Director for [place] and [facility].

## 2015-05-05 ENCOUNTER — Encounter: Payer: Medicare Other | Admitting: *Deleted

## 2015-05-05 DIAGNOSIS — I214 Non-ST elevation (NSTEMI) myocardial infarction: Secondary | ICD-10-CM | POA: Diagnosis not present

## 2015-05-05 DIAGNOSIS — Z9861 Coronary angioplasty status: Secondary | ICD-10-CM

## 2015-05-05 NOTE — Progress Notes (Signed)
Daily Session Note  Patient Details  Name: XZAVIER SWINGER MRN: [id number] Date of Birth: 1930-06-26 Referring Provider:  [name], MD  Encounter Date: 05/05/2015  Check In:     Session Check In - 05/05/15 3344    Check-In   Staff Present Heath Lark RN, BSN, CCRP;Renee Dillard [place] MS, ACSM CEP Exercise Physiologist;Rebekha Diveley Alfonso Patten, ACSM CEP Exercise Physiologist   ER physicians immediately available to respond to emergencies See telemetry face sheet for immediately available ER MD   Medication changes reported     No   Fall or balance concerns reported    No   Warm-up and Cool-down Performed on first and last piece of equipment   VAD Patient? No   Pain Assessment   Currently in Pain? No/denies   Multiple Pain Sites No         Goals Met:  Independence with exercise equipment Exercise tolerated well No report of cardiac concerns or symptoms  Goals Unmet:  Not Applicable  Goals Comments:    Dr. Emily Filbert is Medical Director for [place] and [facility].

## 2015-05-07 DIAGNOSIS — Z9861 Coronary angioplasty status: Secondary | ICD-10-CM

## 2015-05-07 DIAGNOSIS — I214 Non-ST elevation (NSTEMI) myocardial infarction: Secondary | ICD-10-CM | POA: Diagnosis not present

## 2015-05-07 NOTE — Progress Notes (Signed)
Daily Session Note  Patient Details  Name: Dwayne Terry MRN: [id number] Date of Birth: 1930-07-21 Referring Provider:  [name], MD  Encounter Date: 05/07/2015  Check In:     Session Check In - 05/07/15 0853    Check-In   Staff Present Heath Lark RN, BSN, CCRP;Ted Leonhart BS, ACSM EP-C, Exercise Physiologist;Renee Dillard [place] MS, ACSM CEP Exercise Physiologist   ER physicians immediately available to respond to emergencies See telemetry face sheet for immediately available ER MD   Medication changes reported     No   Fall or balance concerns reported    No   Warm-up and Cool-down Performed on first and last piece of equipment   VAD Patient? No   Pain Assessment   Currently in Pain? No/denies         Goals Met:  Proper associated with RPD/PD & O2 Sat Exercise tolerated well No report of cardiac concerns or symptoms Strength training completed today  Goals Unmet:  Not Applicable  Goals Comments:    Dr. Emily Filbert is Medical Director for [place] and [facility].

## 2015-05-12 ENCOUNTER — Encounter: Payer: Medicare Other | Admitting: *Deleted

## 2015-05-12 ENCOUNTER — Encounter: Payer: Self-pay | Admitting: *Deleted

## 2015-05-12 DIAGNOSIS — I214 Non-ST elevation (NSTEMI) myocardial infarction: Secondary | ICD-10-CM | POA: Diagnosis not present

## 2015-05-12 DIAGNOSIS — Z9861 Coronary angioplasty status: Secondary | ICD-10-CM

## 2015-05-12 NOTE — Progress Notes (Signed)
Daily Session Note  Patient Details  Name: ASHVIN ADELSON MRN: [id number] Date of Birth: 1929-12-11 Referring Provider:  [name], MD  Encounter Date: 05/12/2015  Check In:     Session Check In - 05/12/15 0850    Check-In   Staff Present Candiss Norse MS, ACSM CEP Exercise Physiologist;Susanne Bice RN, BSN, CCRP;Jheri Mitter Alfonso Patten, ACSM CEP Exercise Physiologist   ER physicians immediately available to respond to emergencies See telemetry face sheet for immediately available ER MD   Medication changes reported     No   Fall or balance concerns reported    No   Warm-up and Cool-down Performed on first and last piece of equipment   VAD Patient? No   Pain Assessment   Currently in Pain? No/denies   Multiple Pain Sites No         Goals Met:  Independence with exercise equipment Exercise tolerated well No report of cardiac concerns or symptoms  Goals Unmet:  Not Applicable  Goals Comments:    Dr. Emily Filbert is Medical Director for [place] [facility].

## 2015-05-16 ENCOUNTER — Telehealth: Payer: Self-pay | Admitting: *Deleted

## 2015-05-16 ENCOUNTER — Encounter: Payer: Self-pay | Admitting: *Deleted

## 2015-05-16 NOTE — Progress Notes (Unsigned)
Cardiac Individual Treatment Plan  Patient Details  Name: SIGFREDO SCHREIER MRN: [id number] Date of Birth: 01-13-1930 Referring Provider:  No ref. provider found  Initial Encounter Date:    Visit Diagnosis: No diagnosis found.  Patient's Home Medications on Admission:  Current outpatient prescriptions:  .  metFORMIN (GLUCOPHAGE) 500 MG tablet, Take by mouth 2 (two) times daily with a meal., Disp: , Rfl:   Past Medical History: No past medical history on file.  Tobacco Use: History  Smoking status  . Not on file  Smokeless tobacco  . Not on file    Labs: Recent Review Flowsheet Data    There is no flowsheet data to display.       Exercise Target Goals:    Exercise Program Goal: Individual exercise prescription set with THRR, safety & activity barriers. Participant demonstrates ability to understand and report RPE using BORG scale, to self-measure pulse accurately, and to acknowledge the importance of the exercise prescription.  Exercise Prescription Goal: Starting with aerobic activity 30 plus minutes a day, 3 days per week for initial exercise prescription. Provide home exercise prescription and guidelines that participant acknowledges understanding prior to discharge.  Activity Barriers & Risk Stratification:   6 Minute Walk:   Initial Exercise Prescription:   Exercise Prescription Changes:     Exercise Prescription Changes      04/21/15 0800 04/22/15 0800         Exercise Review   Progression  Yes      Response to Exercise   Blood Pressure (Admit)  110/60 mmHg      Blood Pressure (Exercise)  134/78 mmHg      Blood Pressure (Exit)  122/80 mmHg      Heart Rate (Admit)  57 bpm      Heart Rate (Exercise)  88 bpm      Heart Rate (Exit)  59 bpm      Rating of Perceived Exertion (Exercise)  13      Frequency  Add 1 additional day to program exercise sessions.      Duration  Progress to 30 minutes of continuous aerobic without signs/symptoms of physical  distress      Intensity  THRR unchanged      Progression  Continue progressive overload as per policy without signs/symptoms or physical distress.      Resistance Training   Training Prescription  Yes      Weight  2      Reps  10-12      Interval Training   Interval Training  No      Treadmill   MPH 2.7 2.7      Grade 0 0      Minutes  15      Arm/Foot Ergometer   Level  4      Watts  20      Minutes  15      Recumbant Elliptical   Level 7 7      RPM 40 40      Minutes  15      REL-XR   Level  4      Watts  60      Minutes  15         Discharge Exercise Prescription:   Nutrition:  Target Goals: Understanding of nutrition guidelines, daily intake of sodium 1500mg , cholesterol 200mg , calories 30% from fat and 7% or less from saturated fats, daily to have 5 or more servings of fruits and vegetables.  Biometrics:    Nutrition Therapy Plan and Nutrition Goals:     Nutrition Therapy & Goals - 05/16/15 1244    Personal Nutrition Goals   Personal Goal #1 [name] he was unable to attend Cardiac Rehab today due to a doctors' appt but hopes to be back next week and to graduate. Also reported he has lost from 221 lbs to 174 lbs by following the Cardiac Rehab Registered Dieticians suggestions.    Intervention Plan   Intervention Using nutrition plan and personal goals to gain a healthy nutrition lifestyle. Add exercise as prescribed.      Nutrition Discharge: Rate Your Plate Scores:     Rate Your Plate - 79/89/21 1941    Rate Your Plate Scores   Post Score 68   Post Score % 76 %      Nutrition Goals Re-Evaluation:     Nutrition Goals Re-Evaluation      04/21/15 0921           Personal Goal #1 Re-Evaluation   Personal Goal #1 Bill reported Dietician encourage him to eat more fruit. He puts blueberries on his cereal now and eats fruit as a dessert.       Goal Progress Seen Yes          Psychosocial: Target Goals: Acknowledge presence or absence of  depression, maximize coping skills, provide positive support system. Participant is able to verbalize types and ability to use techniques and skills needed for reducing stress and depression.  Initial Review & Psychosocial Screening:   Quality of Life Scores:   PHQ-9:     Recent Review Flowsheet Data    There is no flowsheet data to display.      Psychosocial Evaluation and Intervention:   Psychosocial Re-Evaluation:     Psychosocial Re-Evaluation      05/12/15 0951           Psychosocial Re-Evaluation   Comments Counselor Follow up with Mr. Mcnee today reporting feeling stronger since began this program having lost approximately 40 pounds.  He stated he had benefitted a great deal from the dietician & nutrition education as well as the consistent workout program.   Mr. Allman also stated that the first day he came into this program, he felt "worn out" just walking in from the parking lot and now it is done with great ease, so his stamina has improved greatly.  Counselor congratulated Mr. Jobe for all his hard work in taking this program and his health seriously.            Vocational Rehabilitation: Provide vocational rehab assistance to qualifying candidates.   Vocational Rehab Evaluation & Intervention:     Vocational Rehab - 04/20/15 1247    Initial Vocational Rehab Evaluation & Intervention   Assessment shows need for Vocational Rehabilitation No      Education: Education Goals: Education classes will be provided on a weekly basis, covering required topics. Participant will state understanding/return demonstration of topics presented.  Learning Barriers/Preferences:   Education Topics: General Nutrition Guidelines/Fats and Fiber: -Group instruction provided by verbal, written material, models and posters to present the general guidelines for heart healthy nutrition. Gives an explanation and review of dietary fats and fiber.   Controlling Sodium/Reading  Food Labels: -Group verbal and written material supporting the discussion of sodium use in heart healthy nutrition. Review and explanation with models, verbal and written materials for utilization of the food label.   Exercise Physiology & Risk Factors: - Group verbal  and written instruction with models to review the exercise physiology of the cardiovascular system and associated critical values. Details cardiovascular disease risk factors and the goals associated with each risk factor.   Aerobic Exercise & Resistance Training: - Gives group verbal and written discussion on the health impact of inactivity. On the components of aerobic and resistive training programs and the benefits of this training and how to safely progress through these programs.   Flexibility, Balance, General Exercise Guidelines: - Provides group verbal and written instruction on the benefits of flexibility and balance training programs. Provides general exercise guidelines with specific guidelines to those with heart or lung disease. Demonstration and skill practice provided.   Stress Management: - Provides group verbal and written instruction about the health risks of elevated stress, cause of high stress, and healthy ways to reduce stress.   Depression: - Provides group verbal and written instruction on the correlation between heart/lung disease and depressed mood, treatment options, and the stigmas associated with seeking treatment.   Anatomy & Physiology of the Heart: - Group verbal and written instruction and models provide basic cardiac anatomy and physiology, with the coronary electrical and arterial systems. Review of: AMI, Angina, Valve disease, Heart Failure, Cardiac Arrhythmia, Pacemakers, and the ICD.   Cardiac Procedures: - Group verbal and written instruction and models to describe the testing methods done to diagnose heart disease. Reviews the outcomes of the test results. Describes the treatment  choices: Medical Management, Angioplasty, or Coronary Bypass Surgery.   Cardiac Medications: - Group verbal and written instruction to review commonly prescribed medications for heart disease. Reviews the medication, class of the drug, and side effects. Includes the steps to properly store meds and maintain the prescription regimen.   Go Sex-Intimacy & Heart Disease, Get SMART - Goal Setting: - Group verbal and written instruction through game format to discuss heart disease and the return to sexual intimacy. Provides group verbal and written material to discuss and apply goal setting through the application of the S.M.A.R.T. Method.   Other Matters of the Heart: - Provides group verbal, written materials and models to describe Heart Failure, Angina, Valve Disease, and Diabetes in the realm of heart disease. Includes description of the disease process and treatment options available to the cardiac patient.   Exercise & Equipment Safety: - Individual verbal instruction and demonstration of equipment use and safety with use of the equipment.   Infection Prevention: - Provides verbal and written material to individual with discussion of infection control including proper hand washing and proper equipment cleaning during exercise session.   Falls Prevention: - Provides verbal and written material to individual with discussion of falls prevention and safety.   Diabetes: - Individual verbal and written instruction to review signs/symptoms of diabetes, desired ranges of glucose level fasting, after meals and with exercise. Advice that pre and post exercise glucose checks will be done for 3 sessions at entry of program.    Knowledge Questionnaire Score:     Knowledge Questionnaire Score - 05/12/15 1218    Knowledge Questionnaire Score   Post Score 26/28      Personal Goals and Risk Factors at Admission:     Personal Goals and Risk Factors at Admission - 04/20/15 1251    Personal  Goals and Risk Factors on Admission    Weight Management Yes   Intervention Learn and follow the exercise and diet guidelines while in the program. Utilize the nutrition and education classes to help gain knowledge of the diet  and exercise expectations in the program   Increase Aerobic Exercise and Physical Activity Yes   Intervention While in program, learn and follow the exercise prescription taught. Start at a low level workload and increase workload after able to maintain previous level for 30 minutes. Increase time before increasing intensity.   Diabetes No   Hypertension Yes   Goal Participant will see blood pressure controlled within the values of 140/23mm/Hg or within value directed by their physician.   Intervention Provide nutrition & aerobic exercise along with prescribed medications to achieve BP 140/90 or less.   Lipids Yes   Goal Cholesterol controlled with medications as prescribed, with individualized exercise RX and with personalized nutrition plan. Value goals: LDL < 70mg , HDL > 40mg . Participant states understanding of desired cholesterol values and following prescriptions.   Intervention Provide nutrition & aerobic exercise along with prescribed medications to achieve LDL 70mg , HDL >40mg .   Stress No      Personal Goals and Risk Factors Review:      Goals and Risk Factor Review      04/21/15 0922 05/16/15 1245         Weight Management   Goals Progress/Improvement seen  Yes      Comments  [name] he was unable to attend Cardiac Rehab today due to a doctors' appt but hopes to be back next week and to graduate. Also reported he has lost from 221 lbs to 174 lbs by following the Cardiac Rehab Registered Dieticians suggestions.       Increase Aerobic Exercise and Physical Activity   Goals Progress/Improvement seen  Yes Yes      Comments Bill reports he is glad to be back in Cardiac Rehab exercising since it helps his mood and energy.          Personal Goals Discharge:      Comments: [name] he was unable to attend Cardiac Rehab today due to a doctors' appt but hopes to be back next week and to graduate. Also reported he has lost from 221 lbs to 174 lbs by following the Cardiac Rehab Registered Dieticians suggestions.

## 2015-05-16 NOTE — Telephone Encounter (Signed)
Dwayne Terry he was unable to attend Cardiac Rehab today due to a doctors' appt but hopes to be back next week and to graduate. Also reported he has lost from 221 lbs to 174 lbs by following the Cardiac Rehab Registered Dieticians suggestions.

## 2015-05-19 ENCOUNTER — Encounter: Payer: Medicare Other | Admitting: *Deleted

## 2015-05-19 VITALS — Ht 68.5 in | Wt 177.0 lb

## 2015-05-19 DIAGNOSIS — I214 Non-ST elevation (NSTEMI) myocardial infarction: Secondary | ICD-10-CM | POA: Diagnosis not present

## 2015-05-19 DIAGNOSIS — Z9861 Coronary angioplasty status: Secondary | ICD-10-CM

## 2015-05-19 NOTE — Progress Notes (Signed)
Daily Session Note  Patient Details  Name: Dwayne Terry MRN: [id number] Date of Birth: May 18, 1930 Referring Provider:  [name], MD  Encounter Date: 05/19/2015  Check In:     Session Check In - 05/19/15 0839    Check-In   Staff Present Heath Lark RN, BSN, CCRP;Deliyah Muckle BS, ACSM CEP Exercise Physiologist;Carroll Enterkin RN, BSN   ER physicians immediately available to respond to emergencies See telemetry face sheet for immediately available ER MD   Medication changes reported     No   Fall or balance concerns reported    No   Warm-up and Cool-down Performed on first and last piece of equipment   VAD Patient? No   Pain Assessment   Currently in Pain? No/denies   Multiple Pain Sites No           Exercise Prescription Changes - 05/19/15 0800    Exercise Review   Progression Yes   Response to Exercise   Blood Pressure (Admit) 130/80 mmHg   Blood Pressure (Exercise) 146/66 mmHg   Blood Pressure (Exit) 124/64 mmHg   Heart Rate (Admit) 61 bpm   Heart Rate (Exercise) 80 bpm   Heart Rate (Exit) 61 bpm   Rating of Perceived Exertion (Exercise) 12   Frequency Add 1 additional day to program exercise sessions.   Duration Progress to 30 minutes of continuous aerobic without signs/symptoms of physical distress   Intensity THRR unchanged   Progression Continue progressive overload as per policy without signs/symptoms or physical distress.   Resistance Training   Training Prescription Yes   Weight 2   Reps 10-12   Interval Training   Interval Training No   Treadmill   MPH 2.5   Grade 2   Minutes 15   Arm/Foot Ergometer   Level 4   Watts 20   Minutes 15   Recumbant Elliptical   Level 7   RPM 40   Minutes 15   REL-XR   Level 4   Watts 60   Minutes 15      Goals Met:  Independence with exercise equipment Exercise tolerated well Personal goals reviewed No report of cardiac concerns or symptoms Strength training completed today  Goals Unmet:   Not Applicable  Goals Comments:    Dr. Emily Filbert is Medical Director for [facility].

## 2015-05-20 ENCOUNTER — Encounter: Payer: Self-pay | Admitting: *Deleted

## 2015-05-20 DIAGNOSIS — Z9861 Coronary angioplasty status: Secondary | ICD-10-CM

## 2015-05-20 DIAGNOSIS — I214 Non-ST elevation (NSTEMI) myocardial infarction: Secondary | ICD-10-CM

## 2015-05-20 NOTE — Progress Notes (Unsigned)
Cardiac Individual Treatment Plan  Patient Details  Name: Dwayne Terry MRN: [id number] Date of Birth: Jan 06, 1930 Referring Provider:  Dr. Hendley Desanctis Initial Encounter Date:  10/07/2014  Visit Diagnosis: S/P PTCA (percutaneous transluminal coronary angioplasty)  NSTEMI (non-ST elevated myocardial infarction)  Patient's Home Medications on Admission:  Current outpatient prescriptions:  .  metFORMIN (GLUCOPHAGE) 500 MG tablet, Take by mouth 2 (two) times daily with a meal., Disp: , Rfl:   Past Medical History: No past medical history on file.  Tobacco Use: History  Smoking status  . Not on file  Smokeless tobacco  . Not on file    Labs: Recent Review Flowsheet Data    There is no flowsheet data to display.       Exercise Target Goals:    Exercise Program Goal: Individual exercise prescription set with THRR, safety & activity barriers. Participant demonstrates ability to understand and report RPE using BORG scale, to self-measure pulse accurately, and to acknowledge the importance of the exercise prescription.  Exercise Prescription Goal: Starting with aerobic activity 30 plus minutes a day, 3 days per week for initial exercise prescription. Provide home exercise prescription and guidelines that participant acknowledges understanding prior to discharge.  Activity Barriers & Risk Stratification:   6 Minute Walk:     6 Minute Walk      05/19/15 0840       6 Minute Walk   Phase Discharge     Distance 1350 feet     Walk Time 6 minutes     Resting HR 61 bpm     Resting BP 130/80 mmHg     Max Ex. HR 80 bpm     Max Ex. BP 146/66 mmHg     RPE 12     Symptoms No        Initial Exercise Prescription:   Exercise Prescription Changes:     Exercise Prescription Changes      04/21/15 0800 04/22/15 0800 05/19/15 0800       Exercise Review   Progression  Yes Yes     Response to Exercise   Blood Pressure (Admit)  110/60 mmHg 130/80 mmHg     Blood  Pressure (Exercise)  134/78 mmHg 146/66 mmHg     Blood Pressure (Exit)  122/80 mmHg 124/64 mmHg     Heart Rate (Admit)  57 bpm 61 bpm     Heart Rate (Exercise)  88 bpm 80 bpm     Heart Rate (Exit)  59 bpm 61 bpm     Rating of Perceived Exertion (Exercise)  13 12     Frequency  Add 1 additional day to program exercise sessions. Add 1 additional day to program exercise sessions.     Duration  Progress to 30 minutes of continuous aerobic without signs/symptoms of physical distress Progress to 30 minutes of continuous aerobic without signs/symptoms of physical distress     Intensity  THRR unchanged THRR unchanged     Progression  Continue progressive overload as per policy without signs/symptoms or physical distress. Continue progressive overload as per policy without signs/symptoms or physical distress.     Resistance Training   Training Prescription  Yes Yes     Weight  2 2     Reps  10-12 10-12     Interval Training   Interval Training  No No     Treadmill   MPH 2.7 2.7 2.5     Grade 0 0 2     Minutes  15 15     Arm/Foot Ergometer   Level  4 4     Watts  20 20     Minutes  15 15     Recumbant Elliptical   Level 7 7 7      RPM 40 40 40     Minutes  15 15     REL-XR   Level  4 4     Watts  60 60     Minutes  15 15        Discharge Exercise Prescription (Final Exercise Prescription Changes):     Exercise Prescription Changes - 05/19/15 0800    Exercise Review   Progression Yes   Response to Exercise   Blood Pressure (Admit) 130/80 mmHg   Blood Pressure (Exercise) 146/66 mmHg   Blood Pressure (Exit) 124/64 mmHg   Heart Rate (Admit) 61 bpm   Heart Rate (Exercise) 80 bpm   Heart Rate (Exit) 61 bpm   Rating of Perceived Exertion (Exercise) 12   Frequency Add 1 additional day to program exercise sessions.   Duration Progress to 30 minutes of continuous aerobic without signs/symptoms of physical distress   Intensity THRR unchanged   Progression Continue progressive overload  as per policy without signs/symptoms or physical distress.   Resistance Training   Training Prescription Yes   Weight 2   Reps 10-12   Interval Training   Interval Training No   Treadmill   MPH 2.5   Grade 2   Minutes 15   Arm/Foot Ergometer   Level 4   Watts 20   Minutes 15   Recumbant Elliptical   Level 7   RPM 40   Minutes 15   REL-XR   Level 4   Watts 60   Minutes 15      Nutrition:  Target Goals: Understanding of nutrition guidelines, daily intake of sodium 1500mg , cholesterol 200mg , calories 30% from fat and 7% or less from saturated fats, daily to have 5 or more servings of fruits and vegetables.  Biometrics:      Post Biometrics - 05/19/15 0855     Post  Biometrics   Height 5' 8.5" (1.74 m)   Weight 177 lb (80.287 kg)   Waist Circumference 38 inches   Hip Circumference 41 inches   Waist to Hip Ratio 0.93 %   BMI (Calculated) 26.6      Nutrition Therapy Plan and Nutrition Goals:     Nutrition Therapy & Goals - 05/16/15 1244    Personal Nutrition Goals   Personal Goal #1 [name] he was unable to attend Cardiac Rehab today due to a doctors' appt but hopes to be back next week and to graduate. Also reported he has lost from 221 lbs to 174 lbs by following the Cardiac Rehab Registered Dieticians suggestions.    Intervention Plan   Intervention Using nutrition plan and personal goals to gain a healthy nutrition lifestyle. Add exercise as prescribed.      Nutrition Discharge: Rate Your Plate Scores:     Rate Your Plate - 73/53/29 9242    Rate Your Plate Scores   Pre Score 71   Pre Score % 79 %   Post Score 68   Post Score % 76 %   % Change -3 %      Nutrition Goals Re-Evaluation:     Nutrition Goals Re-Evaluation      04/21/15 0921           Personal Goal #1  Re-Evaluation   Personal Goal #1 Bill reported Dietician encourage him to eat more fruit. He puts blueberries on his cereal now and eats fruit as a dessert.       Goal Progress Seen  Yes          Psychosocial: Target Goals: Acknowledge presence or absence of depression, maximize coping skills, provide positive support system. Participant is able to verbalize types and ability to use techniques and skills needed for reducing stress and depression.  Initial Review & Psychosocial Screening:   Quality of Life Scores:   PHQ-9:     Recent Review Flowsheet Data    There is no flowsheet data to display.      Psychosocial Evaluation and Intervention:   Psychosocial Re-Evaluation:     Psychosocial Re-Evaluation      05/12/15 0951           Psychosocial Re-Evaluation   Comments Counselor Follow up with Mr. Beitler today reporting feeling stronger since began this program having lost approximately 40 pounds.  He stated he had benefitted a great deal from the dietician & nutrition education as well as the consistent workout program.   Mr. Dejaynes also stated that the first day he came into this program, he felt "worn out" just walking in from the parking lot and now it is done with great ease, so his stamina has improved greatly.  Counselor congratulated Mr. Rainone for all his hard work in taking this program and his health seriously.            Vocational Rehabilitation: Provide vocational rehab assistance to qualifying candidates.   Vocational Rehab Evaluation & Intervention:     Vocational Rehab - 04/20/15 1247    Initial Vocational Rehab Evaluation & Intervention   Assessment shows need for Vocational Rehabilitation No      Education: Education Goals: Education classes will be provided on a weekly basis, covering required topics. Participant will state understanding/return demonstration of topics presented.  Learning Barriers/Preferences:   Education Topics: General Nutrition Guidelines/Fats and Fiber: -Group instruction provided by verbal, written material, models and posters to present the general guidelines for heart healthy nutrition. Gives an  explanation and review of dietary fats and fiber.   Controlling Sodium/Reading Food Labels: -Group verbal and written material supporting the discussion of sodium use in heart healthy nutrition. Review and explanation with models, verbal and written materials for utilization of the food label.   Exercise Physiology & Risk Factors: - Group verbal and written instruction with models to review the exercise physiology of the cardiovascular system and associated critical values. Details cardiovascular disease risk factors and the goals associated with each risk factor.   Aerobic Exercise & Resistance Training: - Gives group verbal and written discussion on the health impact of inactivity. On the components of aerobic and resistive training programs and the benefits of this training and how to safely progress through these programs.   Flexibility, Balance, General Exercise Guidelines: - Provides group verbal and written instruction on the benefits of flexibility and balance training programs. Provides general exercise guidelines with specific guidelines to those with heart or lung disease. Demonstration and skill practice provided.   Stress Management: - Provides group verbal and written instruction about the health risks of elevated stress, cause of high stress, and healthy ways to reduce stress.   Depression: - Provides group verbal and written instruction on the correlation between heart/lung disease and depressed mood, treatment options, and the stigmas associated with seeking treatment.  Anatomy & Physiology of the Heart: - Group verbal and written instruction and models provide basic cardiac anatomy and physiology, with the coronary electrical and arterial systems. Review of: AMI, Angina, Valve disease, Heart Failure, Cardiac Arrhythmia, Pacemakers, and the ICD.   Cardiac Procedures: - Group verbal and written instruction and models to describe the testing methods done to diagnose  heart disease. Reviews the outcomes of the test results. Describes the treatment choices: Medical Management, Angioplasty, or Coronary Bypass Surgery.   Cardiac Medications: - Group verbal and written instruction to review commonly prescribed medications for heart disease. Reviews the medication, class of the drug, and side effects. Includes the steps to properly store meds and maintain the prescription regimen.   Go Sex-Intimacy & Heart Disease, Get SMART - Goal Setting: - Group verbal and written instruction through game format to discuss heart disease and the return to sexual intimacy. Provides group verbal and written material to discuss and apply goal setting through the application of the S.M.A.R.T. Method.   Other Matters of the Heart: - Provides group verbal, written materials and models to describe Heart Failure, Angina, Valve Disease, and Diabetes in the realm of heart disease. Includes description of the disease process and treatment options available to the cardiac patient.   Exercise & Equipment Safety: - Individual verbal instruction and demonstration of equipment use and safety with use of the equipment.   Infection Prevention: - Provides verbal and written material to individual with discussion of infection control including proper hand washing and proper equipment cleaning during exercise session.   Falls Prevention: - Provides verbal and written material to individual with discussion of falls prevention and safety.   Diabetes: - Individual verbal and written instruction to review signs/symptoms of diabetes, desired ranges of glucose level fasting, after meals and with exercise. Advice that pre and post exercise glucose checks will be done for 3 sessions at entry of program.    Knowledge Questionnaire Score:     Knowledge Questionnaire Score - 05/12/15 1218    Knowledge Questionnaire Score   Post Score 26/28      Personal Goals and Risk Factors at  Admission:     Personal Goals and Risk Factors at Admission - 04/20/15 1251    Personal Goals and Risk Factors on Admission    Weight Management Yes   Intervention Learn and follow the exercise and diet guidelines while in the program. Utilize the nutrition and education classes to help gain knowledge of the diet and exercise expectations in the program   Increase Aerobic Exercise and Physical Activity Yes   Intervention While in program, learn and follow the exercise prescription taught. Start at a low level workload and increase workload after able to maintain previous level for 30 minutes. Increase time before increasing intensity.   Diabetes No   Hypertension Yes   Goal Participant will see blood pressure controlled within the values of 140/42mm/Hg or within value directed by their physician.   Intervention Provide nutrition & aerobic exercise along with prescribed medications to achieve BP 140/90 or less.   Lipids Yes   Goal Cholesterol controlled with medications as prescribed, with individualized exercise RX and with personalized nutrition plan. Value goals: LDL < 70mg , HDL > 40mg . Participant states understanding of desired cholesterol values and following prescriptions.   Intervention Provide nutrition & aerobic exercise along with prescribed medications to achieve LDL 70mg , HDL >40mg .   Stress No      Personal Goals and Risk Factors Review:  Goals and Risk Factor Review      04/21/15 2233 05/16/15 1245         Weight Management   Goals Progress/Improvement seen  Yes      Comments  [name] he was unable to attend Cardiac Rehab today due to a doctors' appt but hopes to be back next week and to graduate. Also reported he has lost from 221 lbs to 174 lbs by following the Cardiac Rehab Registered Dieticians suggestions.       Increase Aerobic Exercise and Physical Activity   Goals Progress/Improvement seen  Yes Yes      Comments Bill reports he is glad to be back in Cardiac  Rehab exercising since it helps his mood and energy.          Personal Goals Discharge:     Comments: 30 day review

## 2015-05-21 ENCOUNTER — Other Ambulatory Visit: Payer: Self-pay | Admitting: *Deleted

## 2015-05-21 DIAGNOSIS — Z9861 Coronary angioplasty status: Secondary | ICD-10-CM

## 2015-05-21 DIAGNOSIS — I214 Non-ST elevation (NSTEMI) myocardial infarction: Secondary | ICD-10-CM | POA: Diagnosis not present

## 2015-05-21 NOTE — Progress Notes (Signed)
Daily Session Note  Patient Details  Name: Dwayne Terry MRN: [id number] Date of Birth: 08-01-1930 Referring Provider:  [name], MD  Encounter Date: 05/21/2015  Check In:     Session Check In - 05/21/15 0844    Check-In   Staff Present Heath Lark RN, BSN, CCRP;Megham Dwyer BS, ACSM EP-C, Exercise Physiologist;Renee Dillard Essex MS, ACSM CEP Exercise Physiologist   ER physicians immediately available to respond to emergencies See telemetry face sheet for immediately available ER MD   Medication changes reported     No   Fall or balance concerns reported    No   Warm-up and Cool-down Performed on first and last piece of equipment   VAD Patient? No   Pain Assessment   Currently in Pain? No/denies         Goals Met:  Proper associated with RPD/PD & O2 Sat Exercise tolerated well No report of cardiac concerns or symptoms Strength training completed today  Goals Unmet:  Not Applicable  Goals Comments:    Dr. Emily Filbert is Medical Director for [place] [facility].

## 2015-05-23 ENCOUNTER — Encounter: Payer: Medicare Other | Admitting: *Deleted

## 2015-05-23 DIAGNOSIS — I214 Non-ST elevation (NSTEMI) myocardial infarction: Secondary | ICD-10-CM | POA: Diagnosis not present

## 2015-05-23 DIAGNOSIS — Z9861 Coronary angioplasty status: Secondary | ICD-10-CM

## 2015-05-23 NOTE — Progress Notes (Signed)
Daily Session Note  Patient Details  Name: Dwayne Terry MRN: [id number] Date of Birth: [birth year] Referring Provider:  [name], MD  Encounter Date: 05/23/2015  Check In:     Session Check In - 05/23/15 0910    Check-In   Staff Present Heath Lark RN, BSN, CCRP;Renee Dillard Essex MS, ACSM CEP Exercise Physiologist;Carroll Enterkin RN, BSN   ER physicians immediately available to respond to emergencies See telemetry face sheet for immediately available ER MD   Medication changes reported     No   Fall or balance concerns reported    No   Warm-up and Cool-down Performed on first and last piece of equipment   VAD Patient? No   Pain Assessment   Currently in Pain? No/denies         Goals Met:  Independence with exercise equipment Exercise tolerated well Strength training completed today  Goals Unmet:  Not Applicable  Goals Comments: Have seen increase ectopy with increase level. Some symptom reported at same time. Bill decreased workload with relief of symptoms and decrease of ectopy.   Dr. Emily Filbert is Medical Director for [place] [facility].

## 2015-05-26 ENCOUNTER — Encounter: Payer: Medicare Other | Admitting: *Deleted

## 2015-05-26 DIAGNOSIS — I214 Non-ST elevation (NSTEMI) myocardial infarction: Secondary | ICD-10-CM

## 2015-05-26 DIAGNOSIS — Z9861 Coronary angioplasty status: Secondary | ICD-10-CM

## 2015-05-26 NOTE — Patient Instructions (Signed)
Discharge Instructions  Patient Details  Name: RYER ASATO MRN: [id number] Date of Birth: Sep 25, 1930 Referring Provider:  [name], MD   Number of Visits: 53  Reason for Discharge:  Patient reached a stable level of exercise. Patient independent in their exercise.  Smoking History:  History  Smoking status  . Not on file  Smokeless tobacco  . Not on file    Diagnosis:  NSTEMI (non-ST elevated myocardial infarction)  S/P PTCA (percutaneous transluminal coronary angioplasty)  Initial Exercise Prescription:   Discharge Exercise Prescription (Final Exercise Prescription Changes):     Exercise Prescription Changes - 05/19/15 0800    Exercise Review   Progression Yes   Response to Exercise   Blood Pressure (Admit) 130/80 mmHg   Blood Pressure (Exercise) 146/66 mmHg   Blood Pressure (Exit) 124/64 mmHg   Heart Rate (Admit) 61 bpm   Heart Rate (Exercise) 80 bpm   Heart Rate (Exit) 61 bpm   Rating of Perceived Exertion (Exercise) 12   Frequency Add 1 additional day to program exercise sessions.   Duration Progress to 30 minutes of continuous aerobic without signs/symptoms of physical distress   Intensity THRR unchanged   Progression Continue progressive overload as per policy without signs/symptoms or physical distress.   Resistance Training   Training Prescription Yes   Weight 2   Reps 10-12   Interval Training   Interval Training No   Treadmill   MPH 2.5   Grade 2   Minutes 15   Arm/Foot Ergometer   Level 4   Watts 20   Minutes 15   Recumbant Elliptical   Level 7   RPM 40   Minutes 15   REL-XR   Level 4   Watts 60   Minutes 15      Functional Capacity:     6 Minute Walk      05/19/15 0840       6 Minute Walk   Phase Discharge     Distance 1350 feet     Walk Time 6 minutes     Resting HR 61 bpm     Resting BP 130/80 mmHg     Max Ex. HR 80 bpm     Max Ex. BP 146/66 mmHg     RPE 12     Symptoms No        Quality of  Life:   Personal Goals: Goals established at orientation with interventions provided to work toward goal.     Personal Goals and Risk Factors at Admission - 04/20/15 1251    Personal Goals and Risk Factors on Admission    Weight Management Yes   Intervention Learn and follow the exercise and diet guidelines while in the program. Utilize the nutrition and education classes to help gain knowledge of the diet and exercise expectations in the program   Increase Aerobic Exercise and Physical Activity Yes   Intervention While in program, learn and follow the exercise prescription taught. Start at a low level workload and increase workload after able to maintain previous level for 30 minutes. Increase time before increasing intensity.   Diabetes No   Hypertension Yes   Goal Participant will see blood pressure controlled within the values of 140/40mm/Hg or within value directed by their physician.   Intervention Provide nutrition & aerobic exercise along with prescribed medications to achieve BP 140/90 or less.   Lipids Yes   Goal Cholesterol controlled with medications as prescribed, with individualized exercise RX and with personalized  nutrition plan. Value goals: LDL < 70mg , HDL > 40mg . Participant states understanding of desired cholesterol values and following prescriptions.   Intervention Provide nutrition & aerobic exercise along with prescribed medications to achieve LDL 70mg , HDL >40mg .   Stress No       Personal Goals Discharge:   Nutrition & Weight - Outcomes:      Post Biometrics - 05/19/15 0855     Post  Biometrics   Height 5' 8.5" (1.74 m)   Weight 177 lb (80.287 kg)   Waist Circumference 38 inches   Hip Circumference 41 inches   Waist to Hip Ratio 0.93 %   BMI (Calculated) 26.6      Nutrition:     Nutrition Therapy & Goals - 05/16/15 1244    Personal Nutrition Goals   Personal Goal #1 [name] he was unable to attend Cardiac Rehab today due to a doctors' appt but  hopes to be back next week and to graduate. Also reported he has lost from 221 lbs to 174 lbs by following the Cardiac Rehab Registered Dieticians suggestions.    Intervention Plan   Intervention Using nutrition plan and personal goals to gain a healthy nutrition lifestyle. Add exercise as prescribed.      Nutrition Discharge:     Rate Your Plate - 83/25/49 8264    Rate Your Plate Scores   Pre Score 71   Pre Score % 79 %   Post Score 68   Post Score % 76 %   % Change -3 %      Education Questionnaire Score:     Knowledge Questionnaire Score - 05/12/15 1218    Knowledge Questionnaire Score   Post Score 26/28      Goals reviewed with patient; copy given to patient.

## 2015-05-26 NOTE — Progress Notes (Signed)
Daily Session Note  Patient Details  Name: Dwayne Terry MRN: [id number] Date of Birth: May 31, 1930 Referring Provider:  [name], MD  Encounter Date: 05/26/2015  Check In:     Session Check In - 05/26/15 0843    Check-In   Staff Present Heath Lark RN, BSN, CCRP;Kelly Hayes BS, ACSM CEP Exercise Physiologist;Landis Dowdy Dillard Essex MS, ACSM CEP Exercise Physiologist   ER physicians immediately available to respond to emergencies See telemetry face sheet for immediately available ER MD   Medication changes reported     No   Fall or balance concerns reported    No   Warm-up and Cool-down Performed on first and last piece of equipment   VAD Patient? No   Pain Assessment   Currently in Pain? No/denies   Multiple Pain Sites No         Goals Met:  Independence with exercise equipment Exercise tolerated well Personal goals reviewed No report of cardiac concerns or symptoms Strength training completed today  Goals Unmet:  Not Applicable  Goals Comments: Last day of rehab, planning to exercise at home (has a bike and treadmill).    Dr. Emily Filbert is Medical Director for [place] [place] [facility].

## 2015-06-03 ENCOUNTER — Encounter: Payer: Self-pay | Admitting: *Deleted

## 2015-06-03 DIAGNOSIS — Z9861 Coronary angioplasty status: Secondary | ICD-10-CM

## 2015-06-03 DIAGNOSIS — I214 Non-ST elevation (NSTEMI) myocardial infarction: Secondary | ICD-10-CM

## 2015-06-03 NOTE — Progress Notes (Signed)
Discharge Summary  Patient Details  Name: Dwayne Terry MRN: [id number] Date of Birth: Sep 23, 1930 Referring Provider:  Dr. [place]anct[place]  Number of Visits: 36  Reason for Discharge:  Patient reached a stable level of exercise. Patient independent in their exercise.  Smoking History:  History  Smoking status  . Not on file  Smokeless tobacco  . Not on file    Diagnosis:  NSTEMI (non-ST elevated myocardial infarction)  S/P PTCA (percutaneous transluminal coronary angioplasty)  ADL [facility]:   Initial Exercise Prescription:   Discharge Exercise Prescription (Final Exercise Prescription Changes):     Exercise Prescription Changes - 05/19/15 0800    Exercise Review   Progression Yes   Response to Exercise   Blood Pressure (Admit) 130/80 mmHg   Blood Pressure (Exercise) 146/66 mmHg   Blood Pressure (Exit) 124/64 mmHg   Heart Rate (Admit) 61 bpm   Heart Rate (Exercise) 80 bpm   Heart Rate (Exit) 61 bpm   Rating of Perceived Exertion (Exercise) 12   Frequency Add 1 additional day to program exercise sessions.   Duration Progress to 30 minutes of continuous aerobic without signs/symptoms of physical distress   Intensity THRR unchanged   Progression Continue progressive overload as per policy without signs/symptoms or physical distress.   Resistance Training   Training Prescription Yes   Weight 2   Reps 10-12   Interval Training   Interval Training No   Treadmill   MPH 2.5   Grade 2   Minutes 15   Arm/Foot Ergometer   Level 4   Watts 20   Minutes 15   Recumbant Elliptical   Level 7   RPM 40   Minutes 15   REL-XR   Level 4   Watts 60   Minutes 15      Functional Capacity:     6 Minute Walk      05/19/15 0840       6 Minute Walk   Phase Discharge     Distance 1350 feet     Walk Time 6 minutes     Resting HR 61 bpm     Resting BP 130/80 mmHg     Max Ex. HR 80 bpm     Max Ex. BP 146/66 mmHg     RPE 12     Symptoms No        Psychological,  QOL, Others - Outcomes: PHQ 2/9: No flowsheet data found.  Quality of Life:   Personal Goals: Goals established at orientation with interventions provided to work toward goal.     Personal Goals and Risk Factors at Admission - 04/20/15 1251    Personal Goals and Risk Factors on Admission    Weight Management Yes   Intervention Learn and follow the exercise and diet guidelines while in the program. Utilize the nutrition and education classes to help gain knowledge of the diet and exercise expectations in the program   Increase Aerobic Exercise and Physical Activity Yes   Intervention While in program, learn and follow the exercise prescription taught. Start at a low level workload and increase workload after able to maintain previous level for 30 minutes. Increase time before increasing intensity.   Diabetes No   Hypertension Yes   Goal Participant will see blood pressure controlled within the values of 140/46mm/Hg or within value directed by their physician.   Intervention Provide nutrition & aerobic exercise along with prescribed medications to achieve BP 140/90 or less.   Lipids Yes  Goal Cholesterol controlled with medications as prescribed, with individualized exercise RX and with personalized nutrition plan. Value goals: LDL < 70mg , HDL > 40mg . Participant states understanding of desired cholesterol values and following prescriptions.   Intervention Provide nutrition & aerobic exercise along with prescribed medications to achieve LDL 70mg , HDL >40mg .   Stress No       Personal Goals Discharge:   Nutrition & Weight - Outcomes:      Post Biometrics - 05/19/15 0855     Post  Biometrics   Height 5' 8.5" (1.74 m)   Weight 177 lb (80.287 kg)   Waist Circumference 38 inches   Hip Circumference 41 inches   Waist to Hip Ratio 0.93 %   BMI (Calculated) 26.6      Nutrition:     Nutrition Therapy & Goals - 05/16/15 1244    Personal Nutrition Goals   Personal Goal #1 Dwayne Terry  he was unable to attend Cardiac Rehab today due to a doctors' appt but hopes to be back next week and to graduate. Also reported he has lost from 221 lbs to 174 lbs by following the Cardiac Rehab Registered Dieticians suggestions.    Intervention Plan   Intervention Using nutrition plan and personal goals to gain a healthy nutrition lifestyle. Add exercise as prescribed.      Nutrition Discharge:     Rate Your Plate - 44/01/02 7253    Rate Your Plate Scores   Pre Score 71   Pre Score % 79 %   Post Score 68   Post Score % 76 %   % Change -3 %      Education Questionnaire Score:     Knowledge Questionnaire Score - 05/12/15 1218    Knowledge Questionnaire Score   Post Score 26/28

## 2015-06-03 NOTE — Progress Notes (Signed)
Cardiac Individual Treatment Plan  Patient Details  Name: Dwayne Terry MRN: [id number] Date of Birth: 13-Apr-1930 Referring Provider:  No ref. provider found  Initial Encounter Date:    Visit Diagnosis: NSTEMI (non-ST elevated myocardial infarction)  S/P PTCA (percutaneous transluminal coronary angioplasty)  Patient's Home Medications on Admission:  Current outpatient prescriptions:  .  metFORMIN (GLUCOPHAGE) 500 MG tablet, Take by mouth 2 (two) times daily with a meal., Disp: , Rfl:   Past Medical History: No past medical history on file.  Tobacco Use: History  Smoking status  . Not on file  Smokeless tobacco  . Not on file    Labs: Recent Review Flowsheet Data    There is no flowsheet data to display.       Exercise Target Goals:    Exercise Program Goal: Individual exercise prescription set with THRR, safety & activity barriers. Participant demonstrates ability to understand and report RPE using BORG scale, to self-measure pulse accurately, and to acknowledge the importance of the exercise prescription.  Exercise Prescription Goal: Starting with aerobic activity 30 plus minutes a day, 3 days per week for initial exercise prescription. Provide home exercise prescription and guidelines that participant acknowledges understanding prior to discharge.  Activity Barriers & Risk Stratification:   6 Minute Walk:     6 Minute Walk      05/19/15 0840       6 Minute Walk   Phase Discharge     Distance 1350 feet     Walk Time 6 minutes     Resting HR 61 bpm     Resting BP 130/80 mmHg     Max Ex. HR 80 bpm     Max Ex. BP 146/66 mmHg     RPE 12     Symptoms No        Initial Exercise Prescription:   Exercise Prescription Changes:     Exercise Prescription Changes      04/21/15 0800 04/22/15 0800 05/19/15 0800       Exercise Review   Progression  Yes Yes     Response to Exercise   Blood Pressure (Admit)  110/60 mmHg 130/80 mmHg     Blood Pressure  (Exercise)  134/78 mmHg 146/66 mmHg     Blood Pressure (Exit)  122/80 mmHg 124/64 mmHg     Heart Rate (Admit)  57 bpm 61 bpm     Heart Rate (Exercise)  88 bpm 80 bpm     Heart Rate (Exit)  59 bpm 61 bpm     Rating of Perceived Exertion (Exercise)  13 12     Frequency  Add 1 additional day to program exercise sessions. Add 1 additional day to program exercise sessions.     Duration  Progress to 30 minutes of continuous aerobic without signs/symptoms of physical distress Progress to 30 minutes of continuous aerobic without signs/symptoms of physical distress     Intensity  THRR unchanged THRR unchanged     Progression  Continue progressive overload as per policy without signs/symptoms or physical distress. Continue progressive overload as per policy without signs/symptoms or physical distress.     Resistance Training   Training Prescription  Yes Yes     Weight  2 2     Reps  10-12 10-12     Interval Training   Interval Training  No No     Treadmill   MPH 2.7 2.7 2.5     Grade 0 0 2  Minutes  15 15     Arm/Foot Ergometer   Level  4 4     Watts  20 20     Minutes  15 15     Recumbant Elliptical   Level 7 7 7      RPM 40 40 40     Minutes  15 15     REL-XR   Level  4 4     Watts  60 60     Minutes  15 15        Discharge Exercise Prescription (Final Exercise Prescription Changes):     Exercise Prescription Changes - 05/19/15 0800    Exercise Review   Progression Yes   Response to Exercise   Blood Pressure (Admit) 130/80 mmHg   Blood Pressure (Exercise) 146/66 mmHg   Blood Pressure (Exit) 124/64 mmHg   Heart Rate (Admit) 61 bpm   Heart Rate (Exercise) 80 bpm   Heart Rate (Exit) 61 bpm   Rating of Perceived Exertion (Exercise) 12   Frequency Add 1 additional day to program exercise sessions.   Duration Progress to 30 minutes of continuous aerobic without signs/symptoms of physical distress   Intensity THRR unchanged   Progression Continue progressive overload as per  policy without signs/symptoms or physical distress.   Resistance Training   Training Prescription Yes   Weight 2   Reps 10-12   Interval Training   Interval Training No   Treadmill   MPH 2.5   Grade 2   Minutes 15   Arm/Foot Ergometer   Level 4   Watts 20   Minutes 15   Recumbant Elliptical   Level 7   RPM 40   Minutes 15   REL-XR   Level 4   Watts 60   Minutes 15      Nutrition:  Target Goals: Understanding of nutrition guidelines, daily intake of sodium 1500mg , cholesterol 200mg , calories 30% from fat and 7% or less from saturated fats, daily to have 5 or more servings of fruits and vegetables.  Biometrics:      Post Biometrics - 05/19/15 0855     Post  Biometrics   Height 5' 8.5" (1.74 m)   Weight 177 lb (80.287 kg)   Waist Circumference 38 inches   Hip Circumference 41 inches   Waist to Hip Ratio 0.93 %   BMI (Calculated) 26.6      Nutrition Therapy Plan and Nutrition Goals:     Nutrition Therapy & Goals - 05/16/15 1244    Personal Nutrition Goals   Personal Goal #1 Dwayne Terry he was unable to attend Cardiac Rehab today due to a doctors' appt but hopes to be back next week and to graduate. Also reported he has lost from 221 lbs to 174 lbs by following the Cardiac Rehab Registered Dieticians suggestions.    Intervention Plan   Intervention Using nutrition plan and personal goals to gain a healthy nutrition lifestyle. Add exercise as prescribed.      Nutrition Discharge: Rate Your Plate Scores:     Rate Your Plate - 47/09/62 8366    Rate Your Plate Scores   Pre Score 71   Pre Score % 79 %   Post Score 68   Post Score % 76 %   % Change -3 %      Nutrition Goals Re-Evaluation:     Nutrition Goals Re-Evaluation      04/21/15 2947           Personal  Goal #1 Re-Evaluation   Personal Goal #1 Dwayne Terry reported Dietician encourage him to eat more fruit. He puts blueberries on his cereal now and eats fruit as a dessert.       Goal Progress Seen Yes           Psychosocial: Target Goals: Acknowledge presence or absence of depression, maximize coping skills, provide positive support system. Participant is able to verbalize types and ability to use techniques and skills needed for reducing stress and depression.  Initial Review & Psychosocial Screening:   Quality of Life Scores:   PHQ-9:     Recent Review Flowsheet Data    There is no flowsheet data to display.      Psychosocial Evaluation and Intervention:   Psychosocial Re-Evaluation:     Psychosocial Re-Evaluation      05/12/15 0951           Psychosocial Re-Evaluation   Comments Counselor Follow up with Dwayne Terry today reporting feeling stronger since began this program having lost approximately 40 pounds.  He stated he had benefitted a great deal from the dietician & nutrition education as well as the consistent workout program.   Dwayne Terry also stated that the first day he came into this program, he felt "worn out" just walking in from the parking lot and now it is done with great ease, so his stamina has improved greatly.  Counselor congratulated Dwayne Terry for all his hard work in taking this program and his health seriously.            Vocational Rehabilitation: Provide vocational rehab assistance to qualifying candidates.   Vocational Rehab Evaluation & Intervention:     Vocational Rehab - 04/20/15 1247    Initial Vocational Rehab Evaluation & Intervention   Assessment shows need for Vocational Rehabilitation No      Education: Education Goals: Education classes will be provided on a weekly basis, covering required topics. Participant will state understanding/return demonstration of topics presented.  Learning Barriers/Preferences:   Education Topics: General Nutrition Guidelines/Fats and Fiber: -Group instruction provided by verbal, written material, models and posters to present the general guidelines for heart healthy nutrition. Gives an explanation  and review of dietary fats and fiber.   Controlling Sodium/Reading Food Labels: -Group verbal and written material supporting the discussion of sodium use in heart healthy nutrition. Review and explanation with models, verbal and written materials for utilization of the food label.   Exercise Physiology & Risk Factors: - Group verbal and written instruction with models to review the exercise physiology of the cardiovascular system and associated critical values. Details cardiovascular disease risk factors and the goals associated with each risk factor.   Aerobic Exercise & Resistance Training: - Gives group verbal and written discussion on the health impact of inactivity. On the components of aerobic and resistive training programs and the benefits of this training and how to safely progress through these programs.   Flexibility, Balance, General Exercise Guidelines: - Provides group verbal and written instruction on the benefits of flexibility and balance training programs. Provides general exercise guidelines with specific guidelines to those with heart or lung disease. Demonstration and skill practice provided.   Stress Management: - Provides group verbal and written instruction about the health risks of elevated stress, cause of high stress, and healthy ways to reduce stress.   Depression: - Provides group verbal and written instruction on the correlation between heart/lung disease and depressed mood, treatment options, and the stigmas associated with seeking treatment.  Anatomy & Physiology of the Heart: - Group verbal and written instruction and models provide basic cardiac anatomy and physiology, with the coronary electrical and arterial systems. Review of: AMI, Angina, Valve disease, Heart Failure, Cardiac Arrhythmia, Pacemakers, and the ICD.   Cardiac Procedures: - Group verbal and written instruction and models to describe the testing methods done to diagnose heart disease.  Reviews the outcomes of the test results. Describes the treatment choices: Medical Management, Angioplasty, or Coronary Bypass Surgery.   Cardiac Medications: - Group verbal and written instruction to review commonly prescribed medications for heart disease. Reviews the medication, class of the drug, and side effects. Includes the steps to properly store meds and maintain the prescription regimen.   Go Sex-Intimacy & Heart Disease, Get SMART - Goal Setting: - Group verbal and written instruction through game format to discuss heart disease and the return to sexual intimacy. Provides group verbal and written material to discuss and apply goal setting through the application of the S.M.A.R.T. Method.   Other Matters of the Heart: - Provides group verbal, written materials and models to describe Heart Failure, Angina, Valve Disease, and Diabetes in the realm of heart disease. Includes description of the disease process and treatment options available to the cardiac patient.   Exercise & Equipment Safety: - Individual verbal instruction and demonstration of equipment use and safety with use of the equipment.   Infection Prevention: - Provides verbal and written material to individual with discussion of infection control including proper hand washing and proper equipment cleaning during exercise session.   Falls Prevention: - Provides verbal and written material to individual with discussion of falls prevention and safety.   Diabetes: - Individual verbal and written instruction to review signs/symptoms of diabetes, desired ranges of glucose level fasting, after meals and with exercise. Advice that pre and post exercise glucose checks will be done for 3 sessions at entry of program.    Knowledge Questionnaire Score:     Knowledge Questionnaire Score - 05/12/15 1218    Knowledge Questionnaire Score   Post Score 26/28      Personal Goals and Risk Factors at Admission:     Personal  Goals and Risk Factors at Admission - 04/20/15 1251    Personal Goals and Risk Factors on Admission    Weight Management Yes   Intervention Learn and follow the exercise and diet guidelines while in the program. Utilize the nutrition and education classes to help gain knowledge of the diet and exercise expectations in the program   Increase Aerobic Exercise and Physical Activity Yes   Intervention While in program, learn and follow the exercise prescription taught. Start at a low level workload and increase workload after able to maintain previous level for 30 minutes. Increase time before increasing intensity.   Diabetes No   Hypertension Yes   Goal Participant will see blood pressure controlled within the values of 140/24mm/Hg or within value directed by their physician.   Intervention Provide nutrition & aerobic exercise along with prescribed medications to achieve BP 140/90 or less.   Lipids Yes   Goal Cholesterol controlled with medications as prescribed, with individualized exercise RX and with personalized nutrition plan. Value goals: LDL < 70mg , HDL > 40mg . Participant states understanding of desired cholesterol values and following prescriptions.   Intervention Provide nutrition & aerobic exercise along with prescribed medications to achieve LDL 70mg , HDL >40mg .   Stress No      Personal Goals and Risk Factors Review:  Goals and Risk Factor Review      04/21/15 3570 05/16/15 1245         Weight Management   Goals Progress/Improvement seen  Yes      Comments  Dwayne Terry he was unable to attend Cardiac Rehab today due to a doctors' appt but hopes to be back next week and to graduate. Also reported he has lost from 221 lbs to 174 lbs by following the Cardiac Rehab Registered Dieticians suggestions.       Increase Aerobic Exercise and Physical Activity   Goals Progress/Improvement seen  Yes Yes      Comments Dwayne Terry reports he is glad to be back in Cardiac Rehab exercising since it  helps his mood and energy.          Personal Goals Discharge:     Comments:Discharged  05/26/2015

## 2015-06-04 ENCOUNTER — Ambulatory Visit: Payer: Medicare Other

## 2015-06-06 ENCOUNTER — Ambulatory Visit: Payer: Medicare Other

## 2015-06-09 ENCOUNTER — Ambulatory Visit: Payer: Medicare Other

## 2015-06-11 ENCOUNTER — Ambulatory Visit: Payer: Medicare Other

## 2015-06-13 ENCOUNTER — Ambulatory Visit: Payer: Medicare Other

## 2015-06-16 ENCOUNTER — Ambulatory Visit: Payer: Medicare Other

## 2015-06-18 ENCOUNTER — Ambulatory Visit: Payer: Medicare Other

## 2015-06-20 ENCOUNTER — Ambulatory Visit: Payer: Medicare Other

## 2015-06-23 ENCOUNTER — Ambulatory Visit: Payer: Medicare Other

## 2015-06-25 ENCOUNTER — Ambulatory Visit: Payer: Medicare Other

## 2015-06-27 ENCOUNTER — Ambulatory Visit: Payer: Medicare Other

## 2017-01-27 ENCOUNTER — Emergency Department: Payer: Medicare HMO

## 2017-01-27 ENCOUNTER — Encounter: Payer: Self-pay | Admitting: Emergency Medicine

## 2017-01-27 ENCOUNTER — Observation Stay
Admission: EM | Admit: 2017-01-27 | Discharge: 2017-01-29 | Disposition: A | Discharge status: Home / Self Care | Payer: Medicare HMO | Attending: Internal Medicine | Admitting: Internal Medicine

## 2017-01-27 DIAGNOSIS — Z7984 Long term (current) use of oral hypoglycemic drugs: Secondary | ICD-10-CM | POA: Diagnosis not present

## 2017-01-27 DIAGNOSIS — R079 Chest pain, unspecified: Secondary | ICD-10-CM | POA: Diagnosis present

## 2017-01-27 DIAGNOSIS — Z66 Do not resuscitate: Secondary | ICD-10-CM | POA: Diagnosis not present

## 2017-01-27 DIAGNOSIS — Z87891 Personal history of nicotine dependence: Secondary | ICD-10-CM | POA: Diagnosis not present

## 2017-01-27 DIAGNOSIS — Z9221 Personal history of antineoplastic chemotherapy: Secondary | ICD-10-CM | POA: Diagnosis not present

## 2017-01-27 DIAGNOSIS — I2510 Atherosclerotic heart disease of native coronary artery without angina pectoris: Secondary | ICD-10-CM | POA: Insufficient documentation

## 2017-01-27 DIAGNOSIS — E871 Hypo-osmolality and hyponatremia: Secondary | ICD-10-CM | POA: Insufficient documentation

## 2017-01-27 DIAGNOSIS — I10 Essential (primary) hypertension: Secondary | ICD-10-CM | POA: Insufficient documentation

## 2017-01-27 DIAGNOSIS — I340 Nonrheumatic mitral (valve) insufficiency: Secondary | ICD-10-CM | POA: Diagnosis not present

## 2017-01-27 DIAGNOSIS — Z8546 Personal history of malignant neoplasm of prostate: Secondary | ICD-10-CM | POA: Insufficient documentation

## 2017-01-27 DIAGNOSIS — T82855A Stenosis of coronary artery stent, initial encounter: Secondary | ICD-10-CM | POA: Insufficient documentation

## 2017-01-27 DIAGNOSIS — R748 Abnormal levels of other serum enzymes: Secondary | ICD-10-CM | POA: Diagnosis present

## 2017-01-27 DIAGNOSIS — Z85830 Personal history of malignant neoplasm of bone: Secondary | ICD-10-CM | POA: Insufficient documentation

## 2017-01-27 DIAGNOSIS — D72829 Elevated white blood cell count, unspecified: Secondary | ICD-10-CM | POA: Diagnosis not present

## 2017-01-27 DIAGNOSIS — Z952 Presence of prosthetic heart valve: Secondary | ICD-10-CM | POA: Diagnosis not present

## 2017-01-27 DIAGNOSIS — I214 Non-ST elevation (NSTEMI) myocardial infarction: Principal | ICD-10-CM | POA: Insufficient documentation

## 2017-01-27 DIAGNOSIS — R778 Other specified abnormalities of plasma proteins: Secondary | ICD-10-CM

## 2017-01-27 DIAGNOSIS — Y838 Other surgical procedures as the cause of abnormal reaction of the patient, or of later complication, without mention of misadventure at the time of the procedure: Secondary | ICD-10-CM | POA: Insufficient documentation

## 2017-01-27 DIAGNOSIS — E785 Hyperlipidemia, unspecified: Secondary | ICD-10-CM | POA: Diagnosis not present

## 2017-01-27 DIAGNOSIS — E119 Type 2 diabetes mellitus without complications: Secondary | ICD-10-CM | POA: Insufficient documentation

## 2017-01-27 DIAGNOSIS — Z951 Presence of aortocoronary bypass graft: Secondary | ICD-10-CM | POA: Insufficient documentation

## 2017-01-27 DIAGNOSIS — N400 Enlarged prostate without lower urinary tract symptoms: Secondary | ICD-10-CM | POA: Insufficient documentation

## 2017-01-27 DIAGNOSIS — C9000 Multiple myeloma not having achieved remission: Secondary | ICD-10-CM | POA: Insufficient documentation

## 2017-01-27 DIAGNOSIS — D696 Thrombocytopenia, unspecified: Secondary | ICD-10-CM | POA: Insufficient documentation

## 2017-01-27 DIAGNOSIS — R7989 Other specified abnormal findings of blood chemistry: Secondary | ICD-10-CM

## 2017-01-27 DIAGNOSIS — I252 Old myocardial infarction: Secondary | ICD-10-CM | POA: Diagnosis not present

## 2017-01-27 DIAGNOSIS — D649 Anemia, unspecified: Secondary | ICD-10-CM | POA: Diagnosis not present

## 2017-01-27 HISTORY — DX: Nonrheumatic mitral (valve) insufficiency: I34.0

## 2017-01-27 HISTORY — DX: Hyperlipidemia, unspecified: E78.5

## 2017-01-27 HISTORY — DX: Acute myocardial infarction, unspecified: I21.9

## 2017-01-27 LAB — BASIC METABOLIC PANEL
Anion gap: 6 (ref 5–15)
BUN: 26 mg/dL — AB (ref 6–20)
CHLORIDE: 103 mmol/L (ref 101–111)
CO2: 22 mmol/L (ref 22–32)
CREATININE: 0.95 mg/dL (ref 0.61–1.24)
Calcium: 9.2 mg/dL (ref 8.9–10.3)
GFR calc Af Amer: >60 mL/min (ref >60)
GLUCOSE: 270 mg/dL — AB (ref 65–99)
Potassium: 4.2 mmol/L (ref 3.5–5.1)
Sodium: 131 mmol/L — ABNORMAL LOW (ref 135–145)

## 2017-01-27 LAB — CBC
HCT: 29 % — ABNORMAL LOW (ref 40.0–52.0)
Hemoglobin: 10.5 g/dL — ABNORMAL LOW (ref 13.0–18.0)
MCH: 34.7 pg — ABNORMAL HIGH (ref 26.0–34.0)
MCHC: 36.4 g/dL — AB (ref 32.0–36.0)
MCV: 95.6 fL (ref 80.0–100.0)
Platelets: 111 10*3/uL — ABNORMAL LOW (ref 150–440)
RBC: 3.03 MIL/uL — ABNORMAL LOW (ref 4.40–5.90)
RDW: 15.6 % — AB (ref 11.5–14.5)
WBC: 14.4 10*3/uL — AB (ref 3.8–10.6)

## 2017-01-27 LAB — TROPONIN I: Troponin I: 0.06 ng/mL (ref <0.03)

## 2017-01-27 MED ORDER — INSULIN ASPART 100 UNIT/ML ~~LOC~~ SOLN
0.0000 [IU] | SUBCUTANEOUS | Status: DC
  Administered 2017-01-28: 3 [IU] via SUBCUTANEOUS
  Administered 2017-01-28: 2 [IU] via SUBCUTANEOUS
  Administered 2017-01-28 (×2): 5 [IU] via SUBCUTANEOUS
  Administered 2017-01-28: 2 [IU] via SUBCUTANEOUS
  Filled 2017-01-27: qty 2
  Filled 2017-01-27: qty 3
  Filled 2017-01-27: qty 2
  Filled 2017-01-27 (×2): qty 5

## 2017-01-27 MED ORDER — ASPIRIN EC 325 MG PO TBEC
325.0000 mg | DELAYED_RELEASE_TABLET | Freq: Every day | ORAL | Status: DC
  Administered 2017-01-28 – 2017-01-29 (×3): 325 mg via ORAL
  Filled 2017-01-27 (×3): qty 1

## 2017-01-27 MED ORDER — MORPHINE SULFATE (PF) 4 MG/ML IV SOLN: 2.0000 mg | INTRAVENOUS | Status: DC | PRN

## 2017-01-27 MED ORDER — ZOLPIDEM TARTRATE 5 MG PO TABS: 5.0000 mg | ORAL_TABLET | Freq: Every evening | ORAL | Status: DC | PRN

## 2017-01-27 MED ORDER — ACETAMINOPHEN 325 MG PO TABS: 650.0000 mg | ORAL_TABLET | ORAL | Status: DC | PRN

## 2017-01-27 MED ORDER — SODIUM CHLORIDE 0.9 % IV SOLN
INTRAVENOUS | Status: DC
  Administered 2017-01-28 (×3): via INTRAVENOUS

## 2017-01-27 MED ORDER — METOPROLOL TARTRATE 25 MG PO TABS: 25.0000 mg | ORAL_TABLET | Freq: Two times a day (BID) | ORAL | Status: DC

## 2017-01-27 MED ORDER — GI COCKTAIL ~~LOC~~
30.0000 mL | Freq: Four times a day (QID) | ORAL | Status: DC | PRN
  Filled 2017-01-27: qty 30

## 2017-01-27 MED ORDER — ALPRAZOLAM 0.25 MG PO TABS: 0.2500 mg | ORAL_TABLET | Freq: Two times a day (BID) | ORAL | Status: DC | PRN

## 2017-01-27 MED ORDER — ONDANSETRON HCL 4 MG/2ML IJ SOLN: 4.0000 mg | Freq: Four times a day (QID) | INTRAMUSCULAR | Status: DC | PRN

## 2017-01-27 MED ORDER — HEPARIN SODIUM (PORCINE) 5000 UNIT/ML IJ SOLN
5000.0000 [IU] | Freq: Three times a day (TID) | INTRAMUSCULAR | Status: DC
  Administered 2017-01-28: 5000 [IU] via SUBCUTANEOUS
  Filled 2017-01-27: qty 1

## 2017-01-27 NOTE — ED Notes (Signed)
Family at bedside. 

## 2017-01-27 NOTE — ED Triage Notes (Signed)
Patient with complaint of central chest pressure that started about 2 hours ago. Patient states that he took Pepcid with no relief.

## 2017-01-27 NOTE — H&P (Signed)
[place] @ [facility] Admission History and Physical Harvie Bridge, D.O.  ---------------------------------------------------------------------------------------------------------------------   PATIENT NAME: Dwayne Terry MR#: [id number] DATE OF BIRTH: 03-Jan-1930 DATE OF ADMISSION: 01/27/2017 PRIMARY CARE PHYSICIAN: Kirk Ruths., MD  REQUESTING/REFERRING PHYSICIAN: ED Dr. Archie Balboa  CHIEF COMPLAINT: Chief Complaint  Patient presents with  . Chest Pain    HISTORY OF PRESENT ILLNESS: Dwayne Terry is a 83 y.o. male with a known history of Multiple myeloma, Obstructive sleep apnea, type 2 diabetes, prostate cancer, nummular dermatitis, hyperlipidemia, hypertension, coronary artery disease status post CABG in 1998(LIMA to ascending LAD presents to the emergency department for evaluation of Chest pain.  Patient was in a usual state of health until this evening. Patient reports that he ate a sandwich, drove home and was resting when he developed central chest pressure which radiated to both the right and left side of his chest. He took 2 Pepcid without any significant relief. He subsequently took 25 mg tabs of Tylenol and the pain starts side. He states that he did not have any associated shortness of breath, dizziness, lightheadedness, palpitations, diaphoresis, nausea. He has not had chest pain like this in the past..  Of note, patient started oral chemo for multiple myeloma yestersday  Otherwise there has been no change in status. Patient has been taking medication as prescribed and there has been no recent change in medication or diet.  There has been no recent illness, travel or sick contacts. There has been no trauma, new exercise or injuries.   Patient denies fevers/chills, weakness, dizziness, shortness of breath, N/V/C/D, abdominal pain, dysuria/frequency, changes in mental status.    PAST MEDICAL HISTORY: Past Medical History:  Diagnosis Date  . Cancer ([place])     bone marrow, prostate cancer  . MI (mitral incompetence)   Multiple myeloma, Obstructive sleep apnea, type 2 diabetes, prostate cancer, nummular dermatitis, hyperlipidemia, hypertension, coronary artery disease status post CABG in 1998(LIMA to ascending LAD    PAST SURGICAL HISTORY: Past Surgical History:  Procedure Laterality Date  . AORTIC VALVE REPLACEMENT (AVR)/CORONARY ARTERY BYPASS GRAFTING (CABG)    . CORONARY ANGIOPLASTY WITH STENT PLACEMENT    Neck surgery, nasal cancer surgery, colonoscopy    SOCIAL HISTORY: Social History  Substance Use Topics  . Smoking status: Former Research scientist (life sciences)  . Smokeless tobacco: Former Systems developer  . Alcohol use No    FAMILY HISTORY: Father with MI, mother with brain cancer, sister with lupus and brother with melanoma   MEDICATIONS AT HOME: Prior to Admission medications   Medication Sig Start Date End Date Taking? Authorizing Provider  metFORMIN (GLUCOPHAGE) 500 MG tablet Take by mouth 2 (two) times daily with a meal.    Historical Provider, MD      DRUG ALLERGIES: No Known Allergies   REVIEW OF SYSTEMS: CONSTITUTIONAL: No fatigue, weakness, fever, chills, weight gain/loss, headache EYES: No blurry or double vision. ENT: No tinnitus, postnasal drip, redness or soreness of the oropharynx. RESPIRATORY: No dyspnea, cough, wheeze, hemoptysis. CARDIOVASCULAR: Positive chest pain, negative orthopnea, palpitations, syncope. GASTROINTESTINAL: No nausea, vomiting, constipation, diarrhea, abdominal pain. No hematemesis, melena or hematochezia. GENITOURINARY: No dysuria, frequency, hematuria. ENDOCRINE: No polyuria or nocturia. No heat or cold intolerance. HEMATOLOGY: No anemia, bruising, bleeding. INTEGUMENTARY: No rashes, ulcers, lesions. MUSCULOSKELETAL: No pain, arthritis, swelling, gout. NEUROLOGIC: No numbness, tingling, weakness or ataxia. No seizure-type activity. PSYCHIATRIC: No anxiety, depression, insomnia.  PHYSICAL EXAMINATION: VITAL  SIGNS: Blood pressure (!) 155/95, pulse 67, temperature 97.9 F (36.6 C), temperature source Oral, resp. rate  14, height '5\' 8"'$  (1.727 m), weight 81.6 kg (180 lb), SpO2 99 %.  GENERAL: 83 y.o.-year-old white male patient, well-developed, well-nourished lying in the bed in no acute distress.  Pleasant and cooperative.   HEENT: Head atraumatic, normocephalic. Pupils equal, round, reactive to light and accommodation. No scleral icterus. Extraocular muscles intact. Oropharynx is clear. Mucus membranes moist. NECK: Supple, full range of motion. No JVD, no bruit heard. No cervical lymphadenopathy. CHEST: Normal breath sounds bilaterally. No wheezing, rales, rhonchi or crackles. No use of accessory muscles of respiration.  No reproducible chest wall tenderness.  CARDIOVASCULAR: S1, S2 normal. No murmurs, rubs, or gallops appreciated. Cap refill <2 seconds. ABDOMEN: Soft, nontender, nondistended. No rebound, guarding, rigidity. Normoactive bowel sounds present in all four quadrants. No organomegaly or mass. EXTREMITIES: Full range of motion. No pedal edema, cyanosis, or clubbing. NEUROLOGIC: Cranial nerves II through XII are grossly intact with no focal sensorimotor deficit. Muscle strength 5/5 in all extremities. Sensation intact. Gait not checked. PSYCHIATRIC: The patient is alert and oriented x 3. Normal affect, mood, thought content. SKIN: Warm, dry, and intact without obvious rash, lesion, or ulcer.  LABORATORY PANEL:  CBC  Recent Labs Lab 01/27/17 2102  WBC 14.4*  HGB 10.5*  HCT 29.0*  PLT 111*   ----------------------------------------------------------------------------------------------------------------- Chemistries  Recent Labs Lab 01/27/17 2102  NA 131*  K 4.2  CL 103  CO2 22  GLUCOSE 270*  BUN 26*  CREATININE 0.95  CALCIUM 9.2   ------------------------------------------------------------------------------------------------------------------ Cardiac Enzymes  Recent  Labs Lab 01/27/17 2102  TROPONINI 0.06*   ------------------------------------------------------------------------------------------------------------------  RADIOLOGY: Dg Chest 2 View  Result Date: 01/27/2017 CLINICAL DATA:  Chest pressure EXAM: CHEST  2 VIEW COMPARISON:  07/07/2013 FINDINGS: The heart size and mediastinal contours are within normal limits. The aorta is slightly uncoiled in appearance with mild atherosclerosis. The patient is status post CABG. Both lungs are clear. Fine peripheral interstitial prominence is noted without alveolar consolidation, CHF, effusion or pneumothorax. Some of these findings may reflect interstitial fibrosis. The visualized skeletal structures are nonacute. IMPRESSION: No active cardiopulmonary disease. Status post CABG with mild atherosclerosis of the aorta. Fine interstitial lung markings peripherally within both lungs may reflect areas of interstitial fibrosis bilaterally. Electronically Signed   By: Ashley Royalty M.D.   On: 01/27/2017 21:33    EKG: Normal sinus rhythm at 79 bpm with normal axis and nonspecific ST-T wave changes.   ECHO 09/27/14 INTERPRETATION NORMAL LEFT VENTRICULAR SYSTOLIC FUNCTION WITH MILD LVH MILD VALVULAR REGURGITATION (See above) NO VALVULAR STENOSIS  IMPRESSION AND PLAN:  This is a 83 y.o. male with a history of Multiple myeloma, Obstructive sleep apnea, type 2 diabetes, prostate cancer, nummular dermatitis, hyperlipidemia, hypertension, coronary artery disease status post CABG in 1998(LIMA to ascending LAD now being admitted with:  1. Chest pain, rule out ACS. Initial troponin 0.06 - Admit to observation with telemetry monitoring. - Trend troponins, check lipids and TSH. - Morphine, nitro,  aspirin and statin ordered.   - GI Cocktail ordered - Nothing by mouth after midnight - Cardiology consult requested.   2. Leukocytosis and thrombocytopenia, chronic secondary to multiple myeloma. No evidence of acute infection  at this time. Patient started oral chemotherapy for multiple myeloma yesterday -Monitor CBC  3. H/o Diabetes - Accuchecks achs with RISS coverage - Heart healthy, carb controlled diet. NPO after midnight  4. Hyponatremia, mild - Monitor BMP in a.m.  5. History of multiple myeloma Continue Revlimidand Decadron  6. History prostate cancer/BPH Continue Proscar  7. History of hypertension Continue Coreg, lisinopril  8. History of coronary artery disease Continue aspirin  10. History of hyperlipidemia Continue atorvastatin  Admission status: Observation, telemetry Diet/Nutrition: Heart healthy Fluids: HL DVT Px: heparin, SCDs and early ambulation Code Status: Full Discharge plan: To home in less than 24 hours  All the records are reviewed and case discussed with ED provider. Management plans discussed with the patient and family who express understanding and agree with plan of care.   TOTAL TIME TAKING CARE OF THIS PATIENT: 60 minutes.   Amori Cooperman D.O. on 01/27/2017 at 10:21 PM Between 7am to 6pm - Pager - [phone] After 6pm go to [email or web address] - [employer] [facility] [place] [facility] Office [phone] CC: Primary care physician; Kirk Ruths., MD     Note: This dictation was prepared with Dragon dictation along with smaller phrase technology. Any transcriptional errors that result from this process are unintentional.

## 2017-01-27 NOTE — ED Notes (Signed)
Called floor to let them know patient on the way 

## 2017-01-27 NOTE — ED Notes (Signed)
The EKG was completed and signed by Dr. Archie Balboa. The EKG was also exported into the system.

## 2017-01-27 NOTE — ED Notes (Signed)
Patient transported to X-ray 

## 2017-01-27 NOTE — ED Notes (Signed)
MD Goodman at bedside. 

## 2017-01-27 NOTE — ED Notes (Signed)
Admitting Provider at bedside. 

## 2017-01-27 NOTE — ED Provider Notes (Signed)
[facility] Emergency Department Provider Note   ____________________________________________   I have reviewed the triage vital signs and the nursing notes.   HISTORY  Chief Complaint Chest Pain   History limited by: Not Limited   HPI Dwayne Terry is a 83 y.o. male who presents to the emergency department today because of concerns for chest pain. It is located in the central and left chest. It was pressure-like. He denies any radiation. No shortness of breath. Wife did think that he was a little more flushed. Patient states that the pain lasts about 30-45 minutes. At the time of my examination patient states he is pain-free.    Past Medical History:  Diagnosis Date  . Cancer ([place])    bone marrow, prostate cancer  . MI (mitral incompetence)     There are no active problems to display for this patient.   Past Surgical History:  Procedure Laterality Date  . AORTIC VALVE REPLACEMENT (AVR)/CORONARY ARTERY BYPASS GRAFTING (CABG)    . CORONARY ANGIOPLASTY WITH STENT PLACEMENT      Prior to Admission medications   Medication Sig Start Date End Date Taking? Authorizing Provider  metFORMIN (GLUCOPHAGE) 500 MG tablet Take by mouth 2 (two) times daily with a meal.    Historical Provider, MD    Allergies Patient has no known allergies.  No family history on file.  Social History Social History  Substance Use Topics  . Smoking status: Former Research scientist (life sciences)  . Smokeless tobacco: Former Systems developer  . Alcohol use No    Review of Systems  Constitutional: Negative for fever. Cardiovascular: Positive for chest pain. Respiratory: Negative for shortness of breath. Gastrointestinal: Negative for abdominal pain, vomiting and diarrhea. Neurological: Negative for headaches, focal weakness or numbness.  10-point ROS otherwise negative.  ____________________________________________   PHYSICAL EXAM:  VITAL SIGNS: ED Triage Vitals  Enc Vitals Group     BP  01/27/17 2052 (!) 173/71     Pulse Rate 01/27/17 2052 88     Resp 01/27/17 2052 18     Temp 01/27/17 2052 97.9 F (36.6 C)     Temp Source 01/27/17 2052 Oral     SpO2 01/27/17 2052 98 %     Weight 01/27/17 2049 180 lb (81.6 kg)     Height 01/27/17 2049 5\' 8"  (1.727 m)   Constitutional: Alert and oriented. Well appearing and in no distress. Eyes: Conjunctivae are normal. Normal extraocular movements. ENT   Head: Normocephalic and atraumatic.   Nose: No congestion/rhinnorhea.   Mouth/Throat: Mucous membranes are moist.   Neck: No stridor. Hematological/Lymphatic/Immunilogical: No cervical lymphadenopathy. Cardiovascular: Normal rate, regular rhythm.  No murmurs, rubs, or gallops.  Respiratory: Normal respiratory effort without tachypnea nor retractions. Breath sounds are clear and equal bilaterally. No wheezes/rales/rhonchi. Gastrointestinal: Soft and non tender. No rebound. No guarding.  Genitourinary: Deferred Musculoskeletal: Normal range of motion in all extremities. No lower extremity edema. Neurologic:  Normal speech and language. No gross focal neurologic deficits are appreciated.  Skin:  Skin is warm, dry and intact. No rash noted. Psychiatric: Mood and affect are normal. Speech and behavior are normal. Patient exhibits appropriate insight and judgment.  ____________________________________________    LABS (pertinent positives/negatives)  Labs Reviewed  BASIC METABOLIC PANEL - Abnormal; Notable for the following:       Result Value   Sodium 131 (*)    Glucose, Bld 270 (*)    BUN 26 (*)    All other components within normal limits  CBC -  Abnormal; Notable for the following:    WBC 14.4 (*)    RBC 3.03 (*)    Hemoglobin 10.5 (*)    HCT 29.0 (*)    MCH 34.7 (*)    MCHC 36.4 (*)    RDW 15.6 (*)    Platelets 111 (*)    All other components within normal limits  TROPONIN I - Abnormal; Notable for the following:    Troponin I 0.06 (*)    All other  components within normal limits     ____________________________________________   EKG  I, Nance Pear, attending physician, personally viewed and interpreted this EKG  EKG Time: 2050 Rate: 79 Rhythm: sinus rhythm Axis: normal Intervals: qtc 473 QRS: narrow, q waves V1 ST changes: no st elevation Impression: abnormal ekg   ____________________________________________    RADIOLOGY  CXR IMPRESSION:  No active cardiopulmonary disease. Status post CABG with mild  atherosclerosis of the aorta. Fine interstitial lung markings  peripherally within both lungs may reflect areas of interstitial  fibrosis bilaterally.     ____________________________________________   PROCEDURES  Procedures  ____________________________________________   INITIAL IMPRESSION / ASSESSMENT AND PLAN / ED COURSE  Pertinent labs & imaging results that were available during my care of the patient were reviewed by me and considered in my medical decision making (see chart for details).  Patient presented to the emergency department today because of concerns for chest pain. At this time my examination pain is on. She does have a significant cardiac history. Initial troponin 0.06. Unclear if patient has baseline elevation, no troponin to compare to. This point will plan on admission to hospital service.  ____________________________________________   FINAL CLINICAL IMPRESSION(S) / ED DIAGNOSES  Final diagnoses:  Chest pain, unspecified type  Elevated troponin     Note: This dictation was prepared with Dragon dictation. Any transcriptional errors that result from this process are unintentional     Nance Pear, MD 01/27/17 2247

## 2017-01-28 ENCOUNTER — Encounter
Admission: EM | Disposition: A | Discharge status: Home / Self Care | Payer: Self-pay | Source: Home / Self Care | Attending: Emergency Medicine

## 2017-01-28 HISTORY — PX: CORONARY STENT INTERVENTION: CATH118234

## 2017-01-28 HISTORY — PX: LEFT HEART CATH AND CORONARY ANGIOGRAPHY: CATH118249

## 2017-01-28 LAB — TROPONIN I
TROPONIN I: 0.19 ng/mL — AB (ref <0.03)
TROPONIN I: 0.46 ng/mL — AB (ref <0.03)
Troponin I: 0.42 ng/mL (ref <0.03)

## 2017-01-28 LAB — GLUCOSE, CAPILLARY
GLUCOSE-CAPILLARY: 246 mg/dL — AB (ref 65–99)
Glucose-Capillary: 221 mg/dL — ABNORMAL HIGH (ref 65–99)
Glucose-Capillary: 165 mg/dL — ABNORMAL HIGH (ref 65–99)
Glucose-Capillary: 173 mg/dL — ABNORMAL HIGH (ref 65–99)
Glucose-Capillary: 124 mg/dL — ABNORMAL HIGH (ref 65–99)
Glucose-Capillary: 142 mg/dL — ABNORMAL HIGH (ref 65–99)

## 2017-01-28 LAB — POCT ACTIVATED CLOTTING TIME: Activated Clotting Time: 230 seconds

## 2017-01-28 SURGERY — LEFT HEART CATH AND CORONARY ANGIOGRAPHY
Anesthesia: Moderate Sedation

## 2017-01-28 MED ORDER — LABETALOL HCL 5 MG/ML IV SOLN
10.0000 mg | INTRAVENOUS | Status: AC | PRN
Start: 2017-01-28 — End: 2017-01-28

## 2017-01-28 MED ORDER — ASPIRIN 81 MG PO CHEW
CHEWABLE_TABLET | ORAL | Status: AC
  Filled 2017-01-28: qty 3

## 2017-01-28 MED ORDER — SODIUM CHLORIDE 0.9 % IV SOLN: 250.0000 mL | INTRAVENOUS | Status: DC | PRN

## 2017-01-28 MED ORDER — ASPIRIN 81 MG PO CHEW
CHEWABLE_TABLET | ORAL | Status: DC | PRN
  Administered 2017-01-28: 243 mg via ORAL

## 2017-01-28 MED ORDER — SODIUM CHLORIDE 0.9 % IV SOLN
INTRAVENOUS | Status: DC | PRN
  Administered 2017-01-28: 1.75 mg/kg/h via INTRAVENOUS

## 2017-01-28 MED ORDER — SODIUM CHLORIDE 0.9% FLUSH: 3.0000 mL | INTRAVENOUS | Status: DC | PRN

## 2017-01-28 MED ORDER — NITROGLYCERIN 1 MG/10 ML FOR IR/CATH LAB
INTRA_ARTERIAL | Status: DC | PRN
  Administered 2017-01-28: 200 ug via INTRACORONARY

## 2017-01-28 MED ORDER — ASPIRIN EC 81 MG PO TBEC: 81.0000 mg | DELAYED_RELEASE_TABLET | Freq: Once | ORAL | Status: DC

## 2017-01-28 MED ORDER — NITROGLYCERIN 5 MG/ML IV SOLN
INTRAVENOUS | Status: AC
Start: 2017-01-28 — End: 2017-01-28
  Filled 2017-01-28: qty 10

## 2017-01-28 MED ORDER — SODIUM CHLORIDE 0.9% FLUSH
3.0000 mL | Freq: Two times a day (BID) | INTRAVENOUS | Status: DC
  Administered 2017-01-29 (×2): 3 mL via INTRAVENOUS

## 2017-01-28 MED ORDER — BIVALIRUDIN BOLUS VIA INFUSION - CUPID
INTRAVENOUS | Status: DC | PRN
Start: 2017-01-28 — End: 2017-01-28
  Administered 2017-01-28: 58.65 mg via INTRAVENOUS

## 2017-01-28 MED ORDER — HYDRALAZINE HCL 20 MG/ML IJ SOLN
5.0000 mg | INTRAMUSCULAR | Status: AC | PRN
Start: 2017-01-28 — End: 2017-01-28

## 2017-01-28 MED ORDER — CLOPIDOGREL BISULFATE 75 MG PO TABS
75.0000 mg | ORAL_TABLET | Freq: Every day | ORAL | Status: DC
  Administered 2017-01-29: 75 mg via ORAL
  Filled 2017-01-28: qty 1

## 2017-01-28 MED ORDER — FENTANYL CITRATE (PF) 100 MCG/2ML IJ SOLN
INTRAMUSCULAR | Status: AC
  Filled 2017-01-28: qty 2

## 2017-01-28 MED ORDER — MIDAZOLAM HCL 2 MG/2ML IJ SOLN
INTRAMUSCULAR | Status: AC
  Filled 2017-01-28: qty 2

## 2017-01-28 MED ORDER — LISINOPRIL 20 MG PO TABS
40.0000 mg | ORAL_TABLET | Freq: Every day | ORAL | Status: DC
  Administered 2017-01-28 – 2017-01-29 (×2): 40 mg via ORAL
  Filled 2017-01-28 (×2): qty 2

## 2017-01-28 MED ORDER — FENTANYL CITRATE (PF) 100 MCG/2ML IJ SOLN
INTRAMUSCULAR | Status: DC | PRN
  Administered 2017-01-28: 50 ug via INTRAVENOUS

## 2017-01-28 MED ORDER — SODIUM CHLORIDE 0.9 % IV SOLN
250.0000 mL | INTRAVENOUS | Status: DC | PRN
Start: 2017-01-29 — End: 2017-01-29

## 2017-01-28 MED ORDER — ATORVASTATIN CALCIUM 20 MG PO TABS: 80.0000 mg | ORAL_TABLET | Freq: Every day | ORAL | Status: DC

## 2017-01-28 MED ORDER — SODIUM CHLORIDE 0.9% FLUSH: 3.0000 mL | Freq: Two times a day (BID) | INTRAVENOUS | Status: DC

## 2017-01-28 MED ORDER — ASPIRIN 81 MG PO CHEW: 81.0000 mg | CHEWABLE_TABLET | ORAL | Status: DC

## 2017-01-28 MED ORDER — ONDANSETRON HCL 4 MG/2ML IJ SOLN: 4.0000 mg | Freq: Four times a day (QID) | INTRAMUSCULAR | Status: DC | PRN

## 2017-01-28 MED ORDER — SODIUM CHLORIDE 0.9 % WEIGHT BASED INFUSION: 1.0000 mL/kg/h | INTRAVENOUS | Status: DC

## 2017-01-28 MED ORDER — BIVALIRUDIN 250 MG IV SOLR
INTRAVENOUS | Status: AC
  Filled 2017-01-28: qty 250

## 2017-01-28 MED ORDER — IOPAMIDOL (ISOVUE-300) INJECTION 61%
INTRAVENOUS | Status: DC | PRN
  Administered 2017-01-28: 195 mL via INTRA_ARTERIAL

## 2017-01-28 MED ORDER — MIDAZOLAM HCL 2 MG/2ML IJ SOLN
INTRAMUSCULAR | Status: DC | PRN
Start: 2017-01-28 — End: 2017-01-28
  Administered 2017-01-28: 1 mg via INTRAVENOUS

## 2017-01-28 MED ORDER — ENOXAPARIN SODIUM 80 MG/0.8ML ~~LOC~~ SOLN
1.0000 mg/kg | Freq: Two times a day (BID) | SUBCUTANEOUS | Status: DC
  Administered 2017-01-28 – 2017-01-29 (×3): 80 mg via SUBCUTANEOUS
  Filled 2017-01-28 (×3): qty 0.8

## 2017-01-28 MED ORDER — CARVEDILOL 6.25 MG PO TABS
6.2500 mg | ORAL_TABLET | Freq: Two times a day (BID) | ORAL | Status: DC
  Administered 2017-01-28 – 2017-01-29 (×3): 6.25 mg via ORAL
  Filled 2017-01-28 (×4): qty 1

## 2017-01-28 MED ORDER — ADULT MULTIVITAMIN W/MINERALS CH
1.0000 | ORAL_TABLET | Freq: Every day | ORAL | Status: DC
  Administered 2017-01-28 – 2017-01-29 (×2): 1 via ORAL
  Filled 2017-01-28 (×2): qty 1

## 2017-01-28 MED ORDER — CARVEDILOL 6.25 MG PO TABS: 6.2500 mg | ORAL_TABLET | Freq: Two times a day (BID) | ORAL | Status: DC

## 2017-01-28 MED ORDER — CLOPIDOGREL BISULFATE 75 MG PO TABS
300.0000 mg | ORAL_TABLET | Freq: Once | ORAL | Status: AC
  Administered 2017-01-28: 300 mg via ORAL
  Filled 2017-01-28: qty 4

## 2017-01-28 MED ORDER — FINASTERIDE 5 MG PO TABS
5.0000 mg | ORAL_TABLET | Freq: Every day | ORAL | Status: DC
  Administered 2017-01-28 – 2017-01-29 (×2): 5 mg via ORAL
  Filled 2017-01-28 (×2): qty 1

## 2017-01-28 MED ORDER — CLOPIDOGREL BISULFATE 75 MG PO TABS
ORAL_TABLET | ORAL | Status: DC | PRN
  Administered 2017-01-28: 600 mg via ORAL

## 2017-01-28 MED ORDER — HEPARIN (PORCINE) IN NACL 2-0.9 UNIT/ML-% IJ SOLN
INTRAMUSCULAR | Status: AC
  Filled 2017-01-28: qty 1000

## 2017-01-28 MED ORDER — SODIUM CHLORIDE 0.9 % WEIGHT BASED INFUSION
1.0000 mL/kg/h | INTRAVENOUS | Status: AC
  Administered 2017-01-28: 1 mL/kg/h via INTRAVENOUS

## 2017-01-28 MED ORDER — LENALIDOMIDE 15 MG PO CAPS: 15.0000 mg | ORAL_CAPSULE | Freq: Every day | ORAL | Status: DC

## 2017-01-28 MED ORDER — SODIUM CHLORIDE 0.9 % WEIGHT BASED INFUSION: 3.0000 mL/kg/h | INTRAVENOUS | Status: DC

## 2017-01-28 MED ORDER — CLOPIDOGREL BISULFATE 75 MG PO TABS
ORAL_TABLET | ORAL | Status: AC
  Filled 2017-01-28: qty 8

## 2017-01-28 MED ORDER — ATORVASTATIN CALCIUM 20 MG PO TABS
80.0000 mg | ORAL_TABLET | Freq: Every day | ORAL | Status: DC
Start: 2017-01-28 — End: 2017-01-29
  Administered 2017-01-28 (×2): 80 mg via ORAL
  Filled 2017-01-28 (×2): qty 4

## 2017-01-28 MED ORDER — ACETAMINOPHEN 325 MG PO TABS: 650.0000 mg | ORAL_TABLET | ORAL | Status: DC | PRN

## 2017-01-28 SURGICAL SUPPLY — 20 items
BALLN TREK RX 2.5X12 (BALLOONS) ×3
BALLN ~~LOC~~ TREK RX 3.5X12 (BALLOONS) ×3
BALLOON TREK RX 2.5X12 (BALLOONS) IMPLANT
BALLOON ~~LOC~~ TREK RX 3.5X12 (BALLOONS) IMPLANT
CATH INFINITI 5FR ANG PIGTAIL (CATHETERS) ×2 IMPLANT
CATH INFINITI 5FR JL4 (CATHETERS) ×2 IMPLANT
CATH INFINITI JR4 5F (CATHETERS) ×2 IMPLANT
CATH VISTA GUIDE 6FR JR4 SH (CATHETERS) ×2 IMPLANT
DEVICE CLOSURE MYNXGRIP 6/7F (Vascular Products) ×2 IMPLANT
DEVICE INFLAT 30 PLUS (MISCELLANEOUS) ×2 IMPLANT
KIT MANI 3VAL PERCEP (MISCELLANEOUS) ×3 IMPLANT
NDL PERC 18GX7CM (NEEDLE) IMPLANT
NEEDLE PERC 18GX7CM (NEEDLE) ×3 IMPLANT
PACK CARDIAC CATH (CUSTOM PROCEDURE TRAY) ×3 IMPLANT
SHEATH AVANTI 5FR X 11CM (SHEATH) ×2 IMPLANT
SHEATH AVANTI 6FR X 11CM (SHEATH) ×2 IMPLANT
STENT VISION RX 3.0X15 (Permanent Stent) ×2 IMPLANT
WIRE ASAHI PROWATER 180CM (WIRE) ×2 IMPLANT
WIRE EMERALD 3MM-J .035X150CM (WIRE) ×2 IMPLANT
WIRE HITORQ VERSACORE ST 145CM (WIRE) ×2 IMPLANT

## 2017-01-28 NOTE — Progress Notes (Signed)
Pt arrived via stretcher from ED with family at bedside. Pt A&O with no complaints of chest pain or sob. Telemetry monitor applied and called to CCMD.Oriented to room,lights, call bell. Yellow socks applied.

## 2017-01-28 NOTE — Consult Note (Signed)
[facility] Cardiology  CARDIOLOGY CONSULT NOTE  Patient ID: Dwayne Terry MRN: [id number] DOB/AGE: 04/10/1930 83 y.o.  Admit date: 01/27/2017 Referring Physician Anselm Jungling Primary Physician [facility] Primary Cardiologist Meggin Ola Reason for Consultation non-ST elevation myocardial infarction  HPI: 83 year old gentleman referred for evaluation of non-ST elevation myocardial infarction. The patient has known coronary artery disease, status post CABG 12/13/96 with LIMA to LAD and SVG to D1. He is status post stent mid and distal RCA 06/05/1999. He is status post non-ST elevation myocardial infarction status post DES RCA 07/10/2016. Recently diagnosed with multiple myeloma, with anemia and thrombocytopenia. He was in his usual state of health until last evening when he developed substernal chest pressure, unrelieved with 2 Pepcid, finally improved after pain medications. The patient presented to [facility] emergency room where ECG was nondiagnostic. Admission labs were notable for elevated troponin of 0.06, 0.19, and 0.40. The patient currently denies chest pain.  Review of systems complete and found to be negative unless listed above     Past Medical History:  Diagnosis Date  . Cancer ([place])    bone marrow, prostate cancer  . MI (mitral incompetence)   . MI (myocardial infarction)     Past Surgical History:  Procedure Laterality Date  . AORTIC VALVE REPLACEMENT (AVR)/CORONARY ARTERY BYPASS GRAFTING (CABG)    . CORONARY ANGIOPLASTY WITH STENT PLACEMENT      Prescriptions Prior to Admission  Medication Sig Dispense Refill Last Dose  . acetaminophen (TYLENOL) 325 MG tablet Take 650 mg by mouth every 6 (six) hours as needed.   prn  . aspirin EC 81 MG tablet Take 162 mg by mouth 2 (two) times daily.    01/27/2017 at Unknown time  . atorvastatin (LIPITOR) 80 MG tablet Take 80 mg by mouth daily at 6 PM.   01/26/2017 at Unknown time  . carvedilol (COREG) 3.125 MG tablet Take 3.125 mg by mouth 2 (two) times daily  with a meal.   01/27/2017 at Unknown time  . dexamethasone (DECADRON) 4 MG tablet Take 4 mg by mouth once a week.   Past Week at Unknown time  . finasteride (PROSCAR) 5 MG tablet Take 5 mg by mouth daily.   01/27/2017 at Unknown time  . lenalidomide (REVLIMID) 15 MG capsule Take 15 mg by mouth daily.   01/27/2017 at Unknown time  . lisinopril (PRINIVIL,ZESTRIL) 40 MG tablet Take 40 mg by mouth daily.   01/27/2017 at Unknown time  . Multiple Vitamin (MULTI-VITAMINS) TABS Take 1 tablet by mouth daily.   01/27/2017 at Unknown time  . ondansetron (ZOFRAN) 8 MG tablet Take 8 mg by mouth every 8 (eight) hours as needed for nausea or vomiting.   prn   Social History   Social History  . Marital status: Married    Spouse name: N/A  . Number of children: N/A  . Years of education: N/A   Occupational History  . Not on file.   Social History Main Topics  . Smoking status: Former Research scientist (life sciences)  . Smokeless tobacco: Former Systems developer  . Alcohol use No  . Drug use: No  . Sexual activity: Not on file   Other Topics Concern  . Not on file   Social History Narrative  . No narrative on file    No family history on file.    Review of systems complete and found to be negative unless listed above      PHYSICAL EXAM  General: Well developed, well nourished, in no acute distress HEENT:  Normocephalic  and atramatic Neck:  No JVD.  Lungs: Clear bilaterally to auscultation and percussion. Heart: HRRR . Normal S1 and S2 without gallops or murmurs.  Abdomen: Bowel sounds are positive, abdomen soft and non-tender  Msk:  Back normal, normal gait. Normal strength and tone for age. Extremities: No clubbing, cyanosis or edema.   Neuro: Alert and oriented X 3. Psych:  Good affect, responds appropriately  Labs:   Lab Results  Component Value Date   WBC 14.4 (H) 01/27/2017   HGB 10.5 (L) 01/27/2017   HCT 29.0 (L) 01/27/2017   MCV 95.6 01/27/2017   PLT 111 (L) 01/27/2017    Recent Labs Lab 01/27/17 2102  NA  131*  K 4.2  CL 103  CO2 22  BUN 26*  CREATININE 0.95  CALCIUM 9.2  GLUCOSE 270*   Lab Results  Component Value Date   CKTOTAL 600 (H) 07/08/2013   CKMB 44.7 (H) 07/08/2013   TROPONINI 0.42 (HH) 01/28/2017    Lab Results  Component Value Date   CHOL 110 07/08/2013   Lab Results  Component Value Date   HDL 30 (L) 07/08/2013   Lab Results  Component Value Date   LDLCALC 60 07/08/2013   Lab Results  Component Value Date   TRIG 101 07/08/2013   No results found for: CHOLHDL No results found for: LDLDIRECT    Radiology: Dg Chest 2 View  Result Date: 01/27/2017 CLINICAL DATA:  Chest pressure EXAM: CHEST  2 VIEW COMPARISON:  07/07/2013 FINDINGS: The heart size and mediastinal contours are within normal limits. The aorta is slightly uncoiled in appearance with mild atherosclerosis. The patient is status post CABG. Both lungs are clear. Fine peripheral interstitial prominence is noted without alveolar consolidation, CHF, effusion or pneumothorax. Some of these findings may reflect interstitial fibrosis. The visualized skeletal structures are nonacute. IMPRESSION: No active cardiopulmonary disease. Status post CABG with mild atherosclerosis of the aorta. Fine interstitial lung markings peripherally within both lungs may reflect areas of interstitial fibrosis bilaterally. Electronically Signed   By: Ashley Royalty M.D.   On: 01/27/2017 21:33    EKG: Normal sinus rhythm  ASSESSMENT AND PLAN:   1. Non-ST elevation myocardial infarction, versus unstable angina, in patient with known coronary disease, status post CABG, status post multiple coronary stents 2. Multiple myeloma, with chronic anemia, thrombocytopenia with platelet count of 111  Recommendations  1. Agree with current therapy 2. Defer full dose anticoagulation at this time 3. Proceed with cardiac catheterization and potential PCI. Will consider bare metal stent in light of patient's low platelet count recent diagnosis of  multiple myeloma.  Signed: [name] MD,PhD, [facility] 01/28/2017, 8:25 AM

## 2017-01-28 NOTE — Progress Notes (Signed)
Notified MD of medications not ordered from home medicines. Awaiting orders. Will continue to monitor and assess.

## 2017-01-28 NOTE — Care Management Obs Status (Signed)
[place] NOTIFICATION   Patient Details  Name: Dwayne Terry MRN: [id number] Date of Birth: 1930/06/25   Medicare Observation Status Notification Given:  Yes    Katrina Stack, RN 01/28/2017, 4:40 PM

## 2017-01-28 NOTE — Progress Notes (Signed)
[place] [facility] [place] NAME: Dwayne Terry    MR#:  [id number]  DATE OF BIRTH:  13-Jan-1930  SUBJECTIVE:  CHIEF COMPLAINT:   Chief Complaint  Patient presents with  . Chest Pain  Hx of CAD and stent, came with pressure like central chest pain. Troponin kept rising gradually, so cath was done, which showed tripple vessel disease. Metal stent placed. Seen in recovery room post cath.  REVIEW OF SYSTEMS:  CONSTITUTIONAL: No fever, fatigue or weakness.  EYES: No blurred or double vision.  EARS, NOSE, AND THROAT: No tinnitus or ear pain.  RESPIRATORY: No cough, shortness of breath, wheezing or hemoptysis.  CARDIOVASCULAR: No chest pain, orthopnea, edema.  GASTROINTESTINAL: No nausea, vomiting, diarrhea or abdominal pain.  GENITOURINARY: No dysuria, hematuria.  ENDOCRINE: No polyuria, nocturia,  HEMATOLOGY: No anemia, easy bruising or bleeding SKIN: No rash or lesion. MUSCULOSKELETAL: No joint pain or arthritis.   NEUROLOGIC: No tingling, numbness, weakness.  PSYCHIATRY: No anxiety or depression.   ROS  DRUG ALLERGIES:   Allergies  Allergen Reactions  . Antihistamines, Chlorpheniramine-Type   . Nitroglycerin     hypotension    VITALS:  Blood pressure (!) 122/54, pulse (!) 57, temperature 97.7 F (36.5 C), resp. rate 16, height _0  (1.727 m), weight 78.2 kg (172 lb 8 oz), SpO2 100 %.  PHYSICAL EXAMINATION:  GENERAL:  83 y.o.-year-old patient lying in the bed with no acute distress.  EYES: Pupils equal, round, reactive to light and accommodation. No scleral icterus. Extraocular muscles intact.  HEENT: Head atraumatic, normocephalic. Oropharynx and nasopharynx clear.  NECK:  Supple, no jugular venous distention. No thyroid enlargement, no tenderness.  LUNGS: Normal breath sounds bilaterally, no wheezing, rales,rhonchi or crepitation. No use of accessory muscles of respiration.  CARDIOVASCULAR: S1, S2 normal. No murmurs, rubs, or gallops.   ABDOMEN: Soft, nontender, nondistended. Bowel sounds present. No organomegaly or mass.  EXTREMITIES: No pedal edema, cyanosis, or clubbing. Distal pulses palpable, right groin dressing clean. NEUROLOGIC: Cranial nerves II through XII are intact. Muscle strength 5/5 in all extremities. Sensation intact. Gait not checked.  PSYCHIATRIC: The patient is alert and oriented x 3.  SKIN: No obvious rash, lesion, or ulcer.   Physical Exam LABORATORY PANEL:   CBC  Recent Labs Lab 01/27/17 2102  WBC 14.4*  HGB 10.5*  HCT 29.0*  PLT 111*   ------------------------------------------------------------------------------------------------------------------  Chemistries   Recent Labs Lab 01/27/17 2102  NA 131*  K 4.2  CL 103  CO2 22  GLUCOSE 270*  BUN 26*  CREATININE 0.95  CALCIUM 9.2   ------------------------------------------------------------------------------------------------------------------  Cardiac Enzymes  Recent Labs Lab 01/28/17 0301 01/28/17 0651  TROPONINI 0.42* 0.46*   ------------------------------------------------------------------------------------------------------------------  RADIOLOGY:  Dg Chest 2 View  Result Date: 01/27/2017 CLINICAL DATA:  Chest pressure EXAM: CHEST  2 VIEW COMPARISON:  07/07/2013 FINDINGS: The heart size and mediastinal contours are within normal limits. The aorta is slightly uncoiled in appearance with mild atherosclerosis. The patient is status post CABG. Both lungs are clear. Fine peripheral interstitial prominence is noted without alveolar consolidation, CHF, effusion or pneumothorax. Some of these findings may reflect interstitial fibrosis. The visualized skeletal structures are nonacute. IMPRESSION: No active cardiopulmonary disease. Status post CABG with mild atherosclerosis of the aorta. Fine interstitial lung markings peripherally within both lungs may reflect areas of interstitial fibrosis bilaterally. Electronically Signed   By:  Ashley Royalty M.D.   On: 01/27/2017 21:33    ASSESSMENT AND PLAN:  Active Problems:   Chest pain, rule out acute myocardial infarction  1. CAD- NSTEMI   Found triple vessel dz,   Cath done- Prox LAD and cx have 100% blockage.   Prox RCA had 95%, bare metal stent placed.    ASA, Plavix, Statin, ACE inhibitor.   Final recommendation per cardiology.  2. Leukocytosis and thrombocytopenia, chronic secondary to multiple myeloma. No evidence of acute infection at this time. Patient started oral chemotherapy for multiple myeloma  -Monitor CBC - Due ot thrombocytopenia, bare matel stent placed as per cardiology.  3. H/o Diabetes - Accuchecks achs with RISS coverage - Heart healthy, carb controlled diet.   4. Hyponatremia, mild - Monitor BMP in a.m.  5. History of multiple myeloma Continue Revlimidand Decadron  6. History prostate cancer/BPH Continue Proscar  7. History of hypertension Continue Coreg, lisinopril  8. History of coronary artery disease Continue aspirin  10. History of hyperlipidemia Continue atorvastatin, high dose.    All the records are reviewed and case discussed with Care Management/Social Workerr. Management plans discussed with the patient, family and they are in agreement.  CODE STATUS: Full.  TOTAL TIME TAKING CARE OF THIS PATIENT: 35 minutes.     POSSIBLE D/C IN 1-2 DAYS, DEPENDING ON CLINICAL CONDITION.   Vaughan Basta M.D on 01/28/2017   Between 7am to 6pm - Pager - [phone]  After 6pm go to [email or web address] - password [email or web address] [facility]  Office  [phone]  CC: Primary care physician; Kirk Ruths., MD  Note: This dictation was prepared with Dragon dictation along with smaller phrase technology. Any transcriptional errors that result from this process are unintentional.

## 2017-01-28 NOTE — Progress Notes (Signed)
Patient returned from specials.  Right femoral site minx closed, clean dry and intact with no evidence of bleeding. VSS. No distress at this time. Bed alarm on. Daughter and wife at bedside. Denies needs at this time.

## 2017-01-29 LAB — CBC
HCT: 24.6 % — ABNORMAL LOW (ref 40.0–52.0)
Hemoglobin: 8.8 g/dL — ABNORMAL LOW (ref 13.0–18.0)
MCH: 34.5 pg — AB (ref 26.0–34.0)
MCHC: 35.7 g/dL (ref 32.0–36.0)
MCV: 96.8 fL (ref 80.0–100.0)
PLATELETS: 94 10*3/uL — AB (ref 150–440)
RBC: 2.55 MIL/uL — ABNORMAL LOW (ref 4.40–5.90)
RDW: 16.2 % — AB (ref 11.5–14.5)
WBC: 8.3 10*3/uL (ref 3.8–10.6)

## 2017-01-29 LAB — BASIC METABOLIC PANEL
Anion gap: 5 (ref 5–15)
BUN: 27 mg/dL — AB (ref 6–20)
CHLORIDE: 106 mmol/L (ref 101–111)
CO2: 24 mmol/L (ref 22–32)
CREATININE: 0.94 mg/dL (ref 0.61–1.24)
Calcium: 8.7 mg/dL — ABNORMAL LOW (ref 8.9–10.3)
GFR calc Af Amer: >60 mL/min (ref >60)
GFR calc non Af Amer: >60 mL/min (ref >60)
GLUCOSE: 121 mg/dL — AB (ref 65–99)
Potassium: 3.8 mmol/L (ref 3.5–5.1)
SODIUM: 135 mmol/L (ref 135–145)

## 2017-01-29 LAB — GLUCOSE, CAPILLARY
Glucose-Capillary: 111 mg/dL — ABNORMAL HIGH (ref 65–99)
Glucose-Capillary: 114 mg/dL — ABNORMAL HIGH (ref 65–99)
Glucose-Capillary: 124 mg/dL — ABNORMAL HIGH (ref 65–99)

## 2017-01-29 MED ORDER — ASPIRIN EC 81 MG PO TBEC: 81.0000 mg | DELAYED_RELEASE_TABLET | Freq: Every day | ORAL | Status: AC

## 2017-01-29 MED ORDER — CLOPIDOGREL BISULFATE 75 MG PO TABS: 75.0000 mg | ORAL_TABLET | Freq: Every day | ORAL | 0 refills | Status: DC

## 2017-01-29 MED ORDER — CARVEDILOL 6.25 MG PO TABS: 6.2500 mg | ORAL_TABLET | Freq: Two times a day (BID) | ORAL | 0 refills | Status: DC

## 2017-01-29 NOTE — Progress Notes (Signed)
[facility] Cardiology  SUBJECTIVE: I don't have chest pain   Vitals:   01/28/17 1717 01/28/17 2045 01/29/17 0427 01/29/17 0725  BP:  (!) 122/54 (!) 132/55 (!) 133/51  Pulse: (!) 57 (!) 57 (!) 56 60  Resp:  16 16 17   Temp:  97.7 F (36.5 C) 98.1 F (36.7 C) 97.7 F (36.5 C)  TempSrc:    Oral  SpO2:  100% 98% 99%  Weight:      Height:         Intake/Output Summary (Last 24 hours) at 01/29/17 1118 Last data filed at 01/29/17 0913  Gross per 24 hour  Intake             1083 ml  Output             1600 ml  Net             -517 ml      PHYSICAL EXAM  General: Well developed, well nourished, in no acute distress HEENT:  Normocephalic and atramatic Neck:  No JVD.  Lungs: Clear bilaterally to auscultation and percussion. Heart: HRRR . Normal S1 and S2 without gallops or murmurs.  Abdomen: Bowel sounds are positive, abdomen soft and non-tender  Msk:  Back normal, normal gait. Normal strength and tone for age. Extremities: No clubbing, cyanosis or edema.   Neuro: Alert and oriented X 3. Psych:  Good affect, responds appropriately   LABS: Basic Metabolic Panel:  Recent Labs  01/27/17 2102 01/29/17 0609  NA 131* 135  K 4.2 3.8  CL 103 106  CO2 22 24  GLUCOSE 270* 121*  BUN 26* 27*  CREATININE 0.95 0.94  CALCIUM 9.2 8.7*   Liver Function Tests: No results for input(s): AST, ALT, ALKPHOS, BILITOT, PROT, ALBUMIN in the last 72 hours. No results for input(s): LIPASE, AMYLASE in the last 72 hours. CBC:  Recent Labs  01/27/17 2102 01/29/17 0609  WBC 14.4* 8.3  HGB 10.5* 8.8*  HCT 29.0* 24.6*  MCV 95.6 96.8  PLT 111* 94*   Cardiac Enzymes:  Recent Labs  01/28/17 0006 01/28/17 0301 01/28/17 0651  TROPONINI 0.19* 0.42* 0.46*   BNP: Invalid input(s): POCBNP D-Dimer: No results for input(s): DDIMER in the last 72 hours. Hemoglobin A1C: No results for input(s): HGBA1C in the last 72 hours. Fasting Lipid Panel: No results for input(s): CHOL, HDL, LDLCALC,  TRIG, CHOLHDL, LDLDIRECT in the last 72 hours. Thyroid Function Tests: No results for input(s): TSH, T4TOTAL, T3FREE, THYROIDAB in the last 72 hours.  Invalid input(s): FREET3 Anemia Panel: No results for input(s): VITAMINB12, FOLATE, FERRITIN, TIBC, IRON, RETICCTPCT in the last 72 hours.  Dg Chest 2 View  Result Date: 01/27/2017 CLINICAL DATA:  Chest pressure EXAM: CHEST  2 VIEW COMPARISON:  07/07/2013 FINDINGS: The heart size and mediastinal contours are within normal limits. The aorta is slightly uncoiled in appearance with mild atherosclerosis. The patient is status post CABG. Both lungs are clear. Fine peripheral interstitial prominence is noted without alveolar consolidation, CHF, effusion or pneumothorax. Some of these findings may reflect interstitial fibrosis. The visualized skeletal structures are nonacute. IMPRESSION: No active cardiopulmonary disease. Status post CABG with mild atherosclerosis of the aorta. Fine interstitial lung markings peripherally within both lungs may reflect areas of interstitial fibrosis bilaterally. Electronically Signed   By: Ashley Royalty M.D.   On: 01/27/2017 21:33     Echo   TELEMETRY: Normal sinus rhythm:  ASSESSMENT AND PLAN:  Active Problems:   Chest pain, rule  out acute myocardial infarction    1. Non-STEMI 2. Bare metal stent proximal RCA 01/28/2017 3. Patent LIMA to LAD, patent SVG to D1  Recommendations  1. Agree with current therapy 2. Continue dual antiplatelet therapy uninterrupted for at least 4-6 weeks 3. May discharge later today   [name], MD, PhD, [facility] 01/29/2017 11:18 AM

## 2017-01-29 NOTE — Progress Notes (Signed)
Pt discharged to home via wc.  Instructions and rx given to pt.  Questions answered.  No distress.  

## 2017-01-29 NOTE — Discharge Summary (Signed)
[place] at [place] NAME: Dwayne Terry    MR#:  [id number]  DATE OF BIRTH:  01-12-1930  DATE OF ADMISSION:  01/27/2017 ADMITTING PHYSICIAN: Harvie Bridge, DO  DATE OF DISCHARGE: 01/29/2017 11:25 AM  PRIMARY CARE PHYSICIAN: Kirk Ruths., MD    ADMISSION DIAGNOSIS:  Elevated troponin [R74.8] Chest pain, unspecified type [R07.9]  DISCHARGE DIAGNOSIS:  Active Problems:   Chest pain, rule out acute myocardial infarction   SECONDARY DIAGNOSIS:   Past Medical History:  Diagnosis Date  . Cancer ([place])    bone marrow, prostate cancer  . Hyperlipidemia   . MI (mitral incompetence)   . MI (myocardial infarction)     HOSPITAL COURSE:   1. Non-ST elevation myocardial infarction. The patient was brought to the cardiac catheter lab and had a stent placed in the proximal RCA. The patient was doing well on the day of discharge. Patient already on aspirin. Plavix was added. Coreg was increased to 6.25 mg twice a day. Patient already on high intensity statin. Patient already on lisinopril. Follow-up with cardiology team as outpatient. 2. Anemia and thrombocytopenia. This is chronic secondary to multiple myeloma. Continue his usual medication regimen. 3. History of diabetes 4. Hyponatremia. Normal sodium upon discharge. 5. History of multiple myeloma 6. History of BPH on Proscar 7. Essential hypertension on Coreg and lisinopril 8. Hyperlipidemia on high-dose atorvastatin. Try to add on lipid profile to this morning's labs   DISCHARGE CONDITIONS:   Satisfactory  CONSULTS OBTAINED:  Treatment Team:  [name], MD  DRUG ALLERGIES:   Allergies  Allergen Reactions  . Antihistamines, Chlorpheniramine-Type   . Nitroglycerin     hypotension    DISCHARGE MEDICATIONS:   Discharge Medication List as of 01/29/2017 10:29 AM    START taking these medications   Details  clopidogrel (PLAVIX) 75 MG tablet Take 1 tablet (75 mg  total) by mouth daily with breakfast., Starting Sun 01/30/2017, Print      CONTINUE these medications which have CHANGED   Details  aspirin EC 81 MG tablet Take 1 tablet (81 mg total) by mouth daily., Starting Sat 01/29/2017, No Print    carvedilol (COREG) 6.25 MG tablet Take 1 tablet (6.25 mg total) by mouth 2 (two) times daily with a meal., Starting Sat 01/29/2017, Print      CONTINUE these medications which have NOT CHANGED   Details  acetaminophen (TYLENOL) 325 MG tablet Take 650 mg by mouth every 6 (six) hours as needed., Historical Med    atorvastatin (LIPITOR) 80 MG tablet Take 80 mg by mouth daily at 6 PM., Historical Med    dexamethasone (DECADRON) 4 MG tablet Take 4 mg by mouth once a week., Historical Med    finasteride (PROSCAR) 5 MG tablet Take 5 mg by mouth daily., Historical Med    lenalidomide (REVLIMID) 15 MG capsule Take 15 mg by mouth daily., Historical Med    lisinopril (PRINIVIL,ZESTRIL) 40 MG tablet Take 40 mg by mouth daily., Historical Med    Multiple Vitamin (MULTI-VITAMINS) TABS Take 1 tablet by mouth daily., Historical Med    ondansetron (ZOFRAN) 8 MG tablet Take 8 mg by mouth every 8 (eight) hours as needed for nausea or vomiting., Historical Med         DISCHARGE INSTRUCTIONS:   Follow-up with Dr. Lorinda Creed one week Follow-up with PMD 2 weeks  If you experience worsening of your admission symptoms, develop shortness of breath, life threatening emergency, suicidal or homicidal thoughts you  must seek medical attention immediately by calling 911 or calling your MD immediately  if symptoms less severe.  You Must read complete instructions/literature along with all the possible adverse reactions/side effects for all the Medicines you take and that have been prescribed to you. Take any new Medicines after you have completely understood and accept all the possible adverse reactions/side effects.   Please note  You were cared for by a hospitalist during your  hospital stay. If you have any questions about your discharge medications or the care you received while you were in the hospital after you are discharged, you can call the unit and asked to speak with the hospitalist on call if the hospitalist that took care of you is not available. Once you are discharged, your primary care physician will handle any further medical issues. Please note that NO REFILLS for any discharge medications will be authorized once you are discharged, as it is imperative that you return to your primary care physician (or establish a relationship with a primary care physician if you do not have one) for your aftercare needs so that they can reassess your need for medications and monitor your lab values.    Today   CHIEF COMPLAINT:   Chief Complaint  Patient presents with  . Chest Pain    HISTORY OF PRESENT ILLNESS:  Dwayne Terry  is a 83 y.o. male with a known history of Heart disease presented with chest pain   VITAL SIGNS:  Blood pressure (!) 133/51, pulse 60, temperature 97.7 F (36.5 C), temperature source Oral, resp. rate 17, height '5\' 8"'$  (1.727 m), weight 78.2 kg (172 lb 8 oz), SpO2 99 %.   PHYSICAL EXAMINATION:  GENERAL:  83 y.o.-year-old patient lying in the bed with no acute distress.  EYES: Pupils equal, round, reactive to light and accommodation. No scleral icterus. Extraocular muscles intact.  HEENT: Head atraumatic, normocephalic. Oropharynx and nasopharynx clear.  NECK:  Supple, no jugular venous distention. No thyroid enlargement, no tenderness.  LUNGS: Normal breath sounds bilaterally, no wheezing, rales,rhonchi or crepitation. No use of accessory muscles of respiration.  CARDIOVASCULAR: S1, S2 normal. No murmurs, rubs, or gallops.  ABDOMEN: Soft, non-tender, non-distended. Bowel sounds present. No organomegaly or mass.  EXTREMITIES: Trace edema, cyanosis, or clubbing.  NEUROLOGIC: Cranial nerves II through XII are intact. Muscle strength 5/5 in  all extremities. Sensation intact. Gait not checked.  PSYCHIATRIC: The patient is alert and oriented x 3.  SKIN: No obvious rash, lesion, or ulcer.   DATA REVIEW:   CBC  Recent Labs Lab 01/29/17 0609  WBC 8.3  HGB 8.8*  HCT 24.6*  PLT 94*    Chemistries   Recent Labs Lab 01/29/17 0609  NA 135  K 3.8  CL 106  CO2 24  GLUCOSE 121*  BUN 27*  CREATININE 0.94  CALCIUM 8.7*    Cardiac Enzymes  Recent Labs Lab 01/28/17 0651  TROPONINI 0.46*    Microbiology Results  Results for orders placed or performed in visit on 07/07/13  Culture, blood (single)     Status: None   Collection Time: 07/07/13  9:20 PM  Result Value Ref Range Status   Micro Text Report   Final       COMMENT                   NO GROWTH AEROBICALLY/ANAEROBICALLY IN 5 DAYS   ANTIBIOTIC  Culture, blood (single)     Status: None   Collection Time: 07/07/13  9:30 PM  Result Value Ref Range Status   Micro Text Report   Final       COMMENT                   NO GROWTH AEROBICALLY/ANAEROBICALLY IN 5 DAYS   ANTIBIOTIC                                                        RADIOLOGY:  Dg Chest 2 View  Result Date: 01/27/2017 CLINICAL DATA:  Chest pressure EXAM: CHEST  2 VIEW COMPARISON:  07/07/2013 FINDINGS: The heart size and mediastinal contours are within normal limits. The aorta is slightly uncoiled in appearance with mild atherosclerosis. The patient is status post CABG. Both lungs are clear. Fine peripheral interstitial prominence is noted without alveolar consolidation, CHF, effusion or pneumothorax. Some of these findings may reflect interstitial fibrosis. The visualized skeletal structures are nonacute. IMPRESSION: No active cardiopulmonary disease. Status post CABG with mild atherosclerosis of the aorta. Fine interstitial lung markings peripherally within both lungs may reflect areas of interstitial fibrosis bilaterally. Electronically Signed    By: Ashley Royalty M.D.   On: 01/27/2017 21:33      Management plans discussed with the patient, And he is agreement.  CODE STATUS:  Code Status History    Date Active Date Inactive Code Status Order ID Comments User Context   01/27/2017 11:56 PM 01/29/2017  2:30 PM Full Code [id number]  Harvie Bridge, DO Inpatient    Advance Directive Documentation   Flowsheet Row Most Recent Value  Type of Advance Directive  Living will  Pre-existing out of facility DNR order (yellow form or pink MOST form)  No data  "MOST" Form in Place?  No data      TOTAL TIME TAKING CARE OF THIS PATIENT: 35 minutes.    Loletha Grayer M.D on 01/29/2017 at 3:35 PM  Between 7am to 6pm - Pager - [phone]  After 6pm go to [email or web address] - password [email or web address]  [facility] Office  [phone]  CC: Primary care physician; Kirk Ruths., MD

## 2017-01-29 NOTE — Plan of Care (Signed)
Problem: Safety: Goal: Ability to remain free from injury will improve Outcome: Progressing Fall precautions in place  Problem: Pain Managment: Goal: General experience of comfort will improve Outcome: Progressing Prn medications  Problem: Tissue Perfusion: Goal: Risk factors for ineffective tissue perfusion will decrease Outcome: Progressing SQ Lovenox  Problem: Activity: Goal: Ability to return to baseline activity level will improve Outcome: Progressing S/p PCI

## 2017-01-31 ENCOUNTER — Encounter: Payer: Self-pay | Admitting: Cardiology

## 2017-03-21 DIAGNOSIS — Z955 Presence of coronary angioplasty implant and graft: Secondary | ICD-10-CM | POA: Insufficient documentation

## 2017-04-29 ENCOUNTER — Encounter
Admission: EM | Disposition: A | Discharge status: Home / Self Care | Payer: Self-pay | Source: Home / Self Care | Attending: Internal Medicine

## 2017-04-29 ENCOUNTER — Other Ambulatory Visit: Payer: Self-pay

## 2017-04-29 ENCOUNTER — Inpatient Hospital Stay
Admission: EM | Admit: 2017-04-29 | Discharge: 2017-04-30 | DRG: 247 | Disposition: A | Discharge status: Home / Self Care | Payer: Medicare HMO | Attending: Specialist | Admitting: Specialist

## 2017-04-29 ENCOUNTER — Emergency Department: Payer: Medicare HMO

## 2017-04-29 DIAGNOSIS — I10 Essential (primary) hypertension: Secondary | ICD-10-CM | POA: Diagnosis present

## 2017-04-29 DIAGNOSIS — Z87891 Personal history of nicotine dependence: Secondary | ICD-10-CM

## 2017-04-29 DIAGNOSIS — N400 Enlarged prostate without lower urinary tract symptoms: Secondary | ICD-10-CM | POA: Diagnosis present

## 2017-04-29 DIAGNOSIS — D72829 Elevated white blood cell count, unspecified: Secondary | ICD-10-CM | POA: Diagnosis present

## 2017-04-29 DIAGNOSIS — Z952 Presence of prosthetic heart valve: Secondary | ICD-10-CM

## 2017-04-29 DIAGNOSIS — E782 Mixed hyperlipidemia: Secondary | ICD-10-CM | POA: Diagnosis present

## 2017-04-29 DIAGNOSIS — Z955 Presence of coronary angioplasty implant and graft: Secondary | ICD-10-CM

## 2017-04-29 DIAGNOSIS — R079 Chest pain, unspecified: Secondary | ICD-10-CM | POA: Diagnosis present

## 2017-04-29 DIAGNOSIS — I252 Old myocardial infarction: Secondary | ICD-10-CM

## 2017-04-29 DIAGNOSIS — I340 Nonrheumatic mitral (valve) insufficiency: Secondary | ICD-10-CM | POA: Diagnosis present

## 2017-04-29 DIAGNOSIS — Z951 Presence of aortocoronary bypass graft: Secondary | ICD-10-CM

## 2017-04-29 DIAGNOSIS — E1165 Type 2 diabetes mellitus with hyperglycemia: Secondary | ICD-10-CM | POA: Diagnosis present

## 2017-04-29 DIAGNOSIS — G4730 Sleep apnea, unspecified: Secondary | ICD-10-CM | POA: Diagnosis present

## 2017-04-29 DIAGNOSIS — Z8546 Personal history of malignant neoplasm of prostate: Secondary | ICD-10-CM

## 2017-04-29 DIAGNOSIS — C9000 Multiple myeloma not having achieved remission: Secondary | ICD-10-CM | POA: Diagnosis present

## 2017-04-29 DIAGNOSIS — I2510 Atherosclerotic heart disease of native coronary artery without angina pectoris: Secondary | ICD-10-CM | POA: Diagnosis present

## 2017-04-29 DIAGNOSIS — Z888 Allergy status to other drugs, medicaments and biological substances status: Secondary | ICD-10-CM | POA: Diagnosis not present

## 2017-04-29 DIAGNOSIS — I214 Non-ST elevation (NSTEMI) myocardial infarction: Secondary | ICD-10-CM | POA: Diagnosis present

## 2017-04-29 DIAGNOSIS — Z7982 Long term (current) use of aspirin: Secondary | ICD-10-CM

## 2017-04-29 HISTORY — PX: CORONARY STENT INTERVENTION: CATH118234

## 2017-04-29 HISTORY — PX: LEFT HEART CATH AND CORS/GRAFTS ANGIOGRAPHY: CATH118250

## 2017-04-29 LAB — TROPONIN I
TROPONIN I: 0.84 ng/mL — AB (ref <0.03)
Troponin I: 16.77 ng/mL (ref <0.03)
Troponin I: 26.75 ng/mL (ref <0.03)
Troponin I: 27.53 ng/mL (ref <0.03)

## 2017-04-29 LAB — BASIC METABOLIC PANEL
ANION GAP: 10 (ref 5–15)
BUN: 37 mg/dL — ABNORMAL HIGH (ref 6–20)
CALCIUM: 8.9 mg/dL (ref 8.9–10.3)
CO2: 22 mmol/L (ref 22–32)
Chloride: 107 mmol/L (ref 101–111)
Creatinine, Ser: 1.05 mg/dL (ref 0.61–1.24)
Glucose, Bld: 242 mg/dL — ABNORMAL HIGH (ref 65–99)
POTASSIUM: 3.7 mmol/L (ref 3.5–5.1)
Sodium: 139 mmol/L (ref 135–145)

## 2017-04-29 LAB — CBC
HEMATOCRIT: 35.2 % — AB (ref 40.0–52.0)
HEMOGLOBIN: 12.5 g/dL — AB (ref 13.0–18.0)
MCH: 32.1 pg (ref 26.0–34.0)
MCHC: 35.4 g/dL (ref 32.0–36.0)
MCV: 90.7 fL (ref 80.0–100.0)
Platelets: 123 10*3/uL — ABNORMAL LOW (ref 150–440)
RBC: 3.88 MIL/uL — AB (ref 4.40–5.90)
RDW: 15.2 % — ABNORMAL HIGH (ref 11.5–14.5)
WBC: 10.9 10*3/uL — ABNORMAL HIGH (ref 3.8–10.6)

## 2017-04-29 LAB — APTT: aPTT: 26 seconds (ref 24–36)

## 2017-04-29 LAB — POCT ACTIVATED CLOTTING TIME: Activated Clotting Time: 406 seconds

## 2017-04-29 LAB — PROTIME-INR
INR: 1.11
PROTHROMBIN TIME: 14.3 s (ref 11.4–15.2)

## 2017-04-29 LAB — GLUCOSE, CAPILLARY: Glucose-Capillary: 136 mg/dL — ABNORMAL HIGH (ref 65–99)

## 2017-04-29 LAB — TSH: TSH: 0.44 u[IU]/mL (ref 0.350–4.500)

## 2017-04-29 SURGERY — LEFT HEART CATH AND CORS/GRAFTS ANGIOGRAPHY
Anesthesia: Moderate Sedation

## 2017-04-29 MED ORDER — CLOPIDOGREL BISULFATE 75 MG PO TABS
ORAL_TABLET | ORAL | Status: AC
  Filled 2017-04-29: qty 3

## 2017-04-29 MED ORDER — SODIUM CHLORIDE 0.9% FLUSH: 3.0000 mL | INTRAVENOUS | Status: DC | PRN

## 2017-04-29 MED ORDER — ADULT MULTIVITAMIN W/MINERALS CH
1.0000 | ORAL_TABLET | Freq: Every day | ORAL | Status: DC
  Filled 2017-04-29 (×2): qty 1

## 2017-04-29 MED ORDER — FINASTERIDE 5 MG PO TABS
5.0000 mg | ORAL_TABLET | Freq: Every day | ORAL | Status: DC
  Administered 2017-04-29 – 2017-04-30 (×2): 5 mg via ORAL
  Filled 2017-04-29 (×2): qty 1

## 2017-04-29 MED ORDER — BIVALIRUDIN BOLUS VIA INFUSION - CUPID
INTRAVENOUS | Status: DC | PRN
  Administered 2017-04-29: 57.825 mg via INTRAVENOUS

## 2017-04-29 MED ORDER — HEPARIN BOLUS VIA INFUSION
4000.0000 [IU] | Freq: Once | INTRAVENOUS | Status: AC
  Administered 2017-04-29: 4000 [IU] via INTRAVENOUS
  Filled 2017-04-29: qty 4000

## 2017-04-29 MED ORDER — ATORVASTATIN CALCIUM 20 MG PO TABS
80.0000 mg | ORAL_TABLET | Freq: Every day | ORAL | Status: DC
  Administered 2017-04-29: 80 mg via ORAL
  Filled 2017-04-29: qty 4

## 2017-04-29 MED ORDER — DEXAMETHASONE 4 MG PO TABS: 4.0000 mg | ORAL_TABLET | ORAL | Status: DC

## 2017-04-29 MED ORDER — ASPIRIN 81 MG PO CHEW
CHEWABLE_TABLET | ORAL | Status: AC
  Filled 2017-04-29: qty 4

## 2017-04-29 MED ORDER — FENTANYL CITRATE (PF) 100 MCG/2ML IJ SOLN
INTRAMUSCULAR | Status: DC | PRN
  Administered 2017-04-29: 25 ug via INTRAVENOUS

## 2017-04-29 MED ORDER — TIROFIBAN (AGGRASTAT) BOLUS VIA INFUSION
INTRAVENOUS | Status: DC | PRN
  Administered 2017-04-29: 1927.5 ug via INTRAVENOUS

## 2017-04-29 MED ORDER — ACETAMINOPHEN 325 MG PO TABS: 650.0000 mg | ORAL_TABLET | Freq: Four times a day (QID) | ORAL | Status: DC | PRN

## 2017-04-29 MED ORDER — LENALIDOMIDE 25 MG PO CAPS
25.0000 mg | ORAL_CAPSULE | Freq: Every day | ORAL | Status: DC
  Administered 2017-04-29 – 2017-04-30 (×2): 25 mg via ORAL
  Filled 2017-04-29: qty 1

## 2017-04-29 MED ORDER — INSULIN ASPART 100 UNIT/ML ~~LOC~~ SOLN
0.0000 [IU] | Freq: Three times a day (TID) | SUBCUTANEOUS | Status: DC
Start: 2017-04-30 — End: 2017-04-30

## 2017-04-29 MED ORDER — BIVALIRUDIN TRIFLUOROACETATE 250 MG IV SOLR
INTRAVENOUS | Status: DC | PRN
  Administered 2017-04-29: 1.75 mg/kg/h via INTRAVENOUS

## 2017-04-29 MED ORDER — SODIUM CHLORIDE 0.9 % WEIGHT BASED INFUSION: 1.0000 mL/kg/h | INTRAVENOUS | Status: DC

## 2017-04-29 MED ORDER — ENOXAPARIN SODIUM 40 MG/0.4ML ~~LOC~~ SOLN
40.0000 mg | SUBCUTANEOUS | Status: DC
  Administered 2017-04-29: 40 mg via SUBCUTANEOUS
  Filled 2017-04-29: qty 0.4

## 2017-04-29 MED ORDER — BIVALIRUDIN TRIFLUOROACETATE 250 MG IV SOLR
INTRAVENOUS | Status: AC
  Filled 2017-04-29: qty 250

## 2017-04-29 MED ORDER — NITROGLYCERIN 5 MG/ML IV SOLN
INTRAVENOUS | Status: AC
  Filled 2017-04-29: qty 10

## 2017-04-29 MED ORDER — FENTANYL CITRATE (PF) 100 MCG/2ML IJ SOLN
INTRAMUSCULAR | Status: AC
  Filled 2017-04-29: qty 2

## 2017-04-29 MED ORDER — TIROFIBAN HCL IN NACL 5-0.9 MG/100ML-% IV SOLN
INTRAVENOUS | Status: AC | PRN
  Administered 2017-04-29: 0.075 ug/kg/min via INTRAVENOUS

## 2017-04-29 MED ORDER — ONDANSETRON HCL 4 MG PO TABS: 4.0000 mg | ORAL_TABLET | Freq: Four times a day (QID) | ORAL | Status: DC | PRN

## 2017-04-29 MED ORDER — MIDAZOLAM HCL 2 MG/2ML IJ SOLN
INTRAMUSCULAR | Status: DC | PRN
  Administered 2017-04-29: 1 mg via INTRAVENOUS

## 2017-04-29 MED ORDER — NITROGLYCERIN 1 MG/10 ML FOR IR/CATH LAB
INTRA_ARTERIAL | Status: DC | PRN
  Administered 2017-04-29: 200 ug via INTRACORONARY

## 2017-04-29 MED ORDER — ASPIRIN EC 81 MG PO TBEC
81.0000 mg | DELAYED_RELEASE_TABLET | Freq: Every day | ORAL | Status: DC
  Administered 2017-04-29 – 2017-04-30 (×2): 81 mg via ORAL
  Filled 2017-04-29 (×2): qty 1

## 2017-04-29 MED ORDER — SODIUM CHLORIDE 0.9% FLUSH: 3.0000 mL | Freq: Two times a day (BID) | INTRAVENOUS | Status: DC

## 2017-04-29 MED ORDER — IOPAMIDOL (ISOVUE-300) INJECTION 61%
INTRAVENOUS | Status: DC | PRN
  Administered 2017-04-29: 235 mL via INTRA_ARTERIAL

## 2017-04-29 MED ORDER — DEXAMETHASONE 4 MG PO TABS: 20.0000 mg | ORAL_TABLET | ORAL | Status: DC

## 2017-04-29 MED ORDER — CLOPIDOGREL BISULFATE 75 MG PO TABS
ORAL_TABLET | ORAL | Status: DC | PRN
  Administered 2017-04-29: 225 mg via ORAL

## 2017-04-29 MED ORDER — INSULIN ASPART 100 UNIT/ML ~~LOC~~ SOLN
3.0000 [IU] | Freq: Three times a day (TID) | SUBCUTANEOUS | Status: DC
Start: 2017-04-30 — End: 2017-04-30

## 2017-04-29 MED ORDER — SODIUM CHLORIDE 0.9 % IV SOLN: 250.0000 mL | INTRAVENOUS | Status: DC | PRN

## 2017-04-29 MED ORDER — ONDANSETRON HCL 4 MG/2ML IJ SOLN: 4.0000 mg | Freq: Four times a day (QID) | INTRAMUSCULAR | Status: DC | PRN

## 2017-04-29 MED ORDER — CLOPIDOGREL BISULFATE 75 MG PO TABS
75.0000 mg | ORAL_TABLET | Freq: Every day | ORAL | Status: DC
  Administered 2017-04-29 – 2017-04-30 (×2): 75 mg via ORAL
  Filled 2017-04-29 (×2): qty 1

## 2017-04-29 MED ORDER — DOCUSATE SODIUM 100 MG PO CAPS
100.0000 mg | ORAL_CAPSULE | Freq: Two times a day (BID) | ORAL | Status: DC
  Administered 2017-04-29 – 2017-04-30 (×2): 100 mg via ORAL
  Filled 2017-04-29 (×2): qty 1

## 2017-04-29 MED ORDER — CARVEDILOL 6.25 MG PO TABS
6.2500 mg | ORAL_TABLET | Freq: Two times a day (BID) | ORAL | Status: DC
  Administered 2017-04-30: 6.25 mg via ORAL
  Filled 2017-04-29: qty 1

## 2017-04-29 MED ORDER — INSULIN ASPART 100 UNIT/ML ~~LOC~~ SOLN: 0.0000 [IU] | Freq: Every day | SUBCUTANEOUS | Status: DC

## 2017-04-29 MED ORDER — ASPIRIN 81 MG PO CHEW
324.0000 mg | CHEWABLE_TABLET | Freq: Once | ORAL | Status: AC
  Administered 2017-04-29: 324 mg via ORAL

## 2017-04-29 MED ORDER — HEPARIN (PORCINE) IN NACL 100-0.45 UNIT/ML-% IJ SOLN
950.0000 [IU]/h | INTRAMUSCULAR | Status: DC
  Administered 2017-04-29: 950 [IU]/h via INTRAVENOUS
  Filled 2017-04-29: qty 250

## 2017-04-29 MED ORDER — LISINOPRIL 20 MG PO TABS
40.0000 mg | ORAL_TABLET | Freq: Every day | ORAL | Status: DC
  Administered 2017-04-29 – 2017-04-30 (×2): 40 mg via ORAL
  Filled 2017-04-29 (×2): qty 2

## 2017-04-29 MED ORDER — SODIUM CHLORIDE 0.9 % WEIGHT BASED INFUSION: 3.0000 mL/kg/h | INTRAVENOUS | Status: DC

## 2017-04-29 MED ORDER — ASPIRIN 81 MG PO CHEW
CHEWABLE_TABLET | ORAL | Status: AC
  Filled 2017-04-29: qty 2

## 2017-04-29 MED ORDER — SODIUM CHLORIDE 0.9 % IV SOLN
INTRAVENOUS | Status: DC
  Administered 2017-04-29 (×2): via INTRAVENOUS

## 2017-04-29 MED ORDER — ASPIRIN 81 MG PO CHEW
CHEWABLE_TABLET | ORAL | Status: DC | PRN
Start: 2017-04-29 — End: 2017-04-29
  Administered 2017-04-29: 162 mg via ORAL

## 2017-04-29 MED ORDER — HEPARIN (PORCINE) IN NACL 2-0.9 UNIT/ML-% IJ SOLN
INTRAMUSCULAR | Status: AC
  Filled 2017-04-29: qty 1000

## 2017-04-29 MED ORDER — MIDAZOLAM HCL 2 MG/2ML IJ SOLN
INTRAMUSCULAR | Status: AC
  Filled 2017-04-29: qty 2

## 2017-04-29 MED ORDER — ENOXAPARIN SODIUM 40 MG/0.4ML ~~LOC~~ SOLN: 40.0000 mg | SUBCUTANEOUS | Status: DC

## 2017-04-29 MED ORDER — TIROFIBAN HCL IN NACL 5-0.9 MG/100ML-% IV SOLN
0.0750 ug/kg/min | INTRAVENOUS | Status: AC
  Administered 2017-04-29 (×2): 0.075 ug/kg/min via INTRAVENOUS
  Filled 2017-04-29 (×2): qty 100

## 2017-04-29 SURGICAL SUPPLY — 20 items
BALLN TREK RX 2.75X15 (BALLOONS) ×2
BALLOON TREK RX 2.75X15 (BALLOONS) IMPLANT
CATH INFINITI 5 FR IM (CATHETERS) ×1 IMPLANT
CATH INFINITI 5FR ANG PIGTAIL (CATHETERS) ×1 IMPLANT
CATH INFINITI 5FR JL4 (CATHETERS) ×1 IMPLANT
CATH INFINITI JR4 5F (CATHETERS) ×2 IMPLANT
CATH VISTA GUIDE 6FR JR4 SH (CATHETERS) ×1 IMPLANT
DEVICE CLOSURE MYNXGRIP 6/7F (Vascular Products) ×1 IMPLANT
DEVICE INFLAT 30 PLUS (MISCELLANEOUS) ×1 IMPLANT
GUIDEWIRE J TIP .035 (WIRE) ×1 IMPLANT
KIT MANI 3VAL PERCEP (MISCELLANEOUS) ×2 IMPLANT
NDL PERC 18GX7CM (NEEDLE) IMPLANT
NEEDLE PERC 18GX7CM (NEEDLE) ×2 IMPLANT
PACK CARDIAC CATH (CUSTOM PROCEDURE TRAY) ×2 IMPLANT
SHEATH AVANTI 5FR X 11CM (SHEATH) ×1 IMPLANT
SHEATH AVANTI 6FR X 11CM (SHEATH) ×1 IMPLANT
STENT XIENCE ALPINE RX 3.0X8 (Permanent Stent) ×1 IMPLANT
WIRE ASAHI PROWATER 180CM (WIRE) ×1 IMPLANT
WIRE EMERALD 3MM-J .035X150CM (WIRE) ×2 IMPLANT
WIRE EMERALD 3MM-J .035X260CM (WIRE) ×1 IMPLANT

## 2017-04-29 NOTE — H&P (Signed)
Dwayne Terry is an 83 y.o. male.   Chief Complaint: Chest pain HPI: The patient with past medical history of coronary artery disease status post bare metal stent placement to RCA in March 2018 presents to the emergency department complaining of chest pain. The patient states that he was riding his lawnmower when his chest pain began. He states that it started in one arm pit and radiated to the other. The pain was pressure like in character, the patient reporting that he felt as if he were being squeezed across the chest. He denies associated shortness of breath, nausea, vomiting or diaphoresis. The patient finished mowing the lawn then drove to get something to eat with his wife. He took some acid reflux medicine without relief at which point his wife urged him to come to the emergency department for further evaluation. After aspirin in the emergency department the patient's chest pain subsided. Initial troponin was elevated at 0.8 which prompted emergency [facility] staff to call the hospitalist service for admission.  Past Medical History:  Diagnosis Date  . Cancer ([place])    bone marrow, prostate cancer  . Hyperlipidemia   . MI (mitral incompetence)   . MI (myocardial infarction) [facility])     Past Surgical History:  Procedure Laterality Date  . AORTIC VALVE REPLACEMENT (AVR)/CORONARY ARTERY BYPASS GRAFTING (CABG)    . CORONARY ANGIOPLASTY WITH STENT PLACEMENT    . CORONARY STENT INTERVENTION N/A 01/28/2017   Procedure: Coronary Stent Intervention;  Surgeon: [name], MD;  Location: [facility];  Service: Cardiovascular;  Laterality: N/A;  . LEFT HEART CATH AND CORONARY ANGIOGRAPHY N/A 01/28/2017   Procedure: Left Heart Cath and Coronary Angiography;  Surgeon: [name], MD;  Location: [facility];  Service: Cardiovascular;  Laterality: N/A;    Family History  Problem Relation Age of Onset  . Heart disease Other    Social History:  reports that he has quit  smoking. He has quit using smokeless tobacco. He reports that he does not drink alcohol or use drugs.  Allergies:  Allergies  Allergen Reactions  . Antihistamines, Chlorpheniramine-Type Other (See Comments)    Reaction: unknown  . Nitroglycerin Other (See Comments)    Reaction: hypotension    Medications Prior to Admission  Medication Sig Dispense Refill  . lenalidomide (REVLIMID) 15 MG capsule Take 15 mg by mouth daily.    Marland Kitchen acetaminophen (TYLENOL) 325 MG tablet Take 650 mg by mouth every 6 (six) hours as needed.    Marland Kitchen aspirin EC 81 MG tablet Take 1 tablet (81 mg total) by mouth daily.    Marland Kitchen atorvastatin (LIPITOR) 80 MG tablet Take 80 mg by mouth daily at 6 PM.    . carvedilol (COREG) 6.25 MG tablet Take 1 tablet (6.25 mg total) by mouth 2 (two) times daily with a meal. 60 tablet 0  . clopidogrel (PLAVIX) 75 MG tablet Take 1 tablet (75 mg total) by mouth daily with breakfast. 30 tablet 0  . dexamethasone (DECADRON) 4 MG tablet Take 4 mg by mouth once a week.    . finasteride (PROSCAR) 5 MG tablet Take 5 mg by mouth daily.    Marland Kitchen lisinopril (PRINIVIL,ZESTRIL) 40 MG tablet Take 40 mg by mouth daily.    . Multiple Vitamin (MULTI-VITAMINS) TABS Take 1 tablet by mouth daily.    . ondansetron (ZOFRAN) 8 MG tablet Take 8 mg by mouth every 8 (eight) hours as needed for nausea or vomiting.      Results  for orders placed or performed during the hospital encounter of 04/29/17 (from the past 48 hour(s))  Basic metabolic panel     Status: Abnormal   Collection Time: 04/29/17  1:42 AM  Result Value Ref Range   Sodium 139 135 - 145 mmol/L   Potassium 3.7 3.5 - 5.1 mmol/L   Chloride 107 101 - 111 mmol/L   CO2 22 22 - 32 mmol/L   Glucose, Bld 242 (H) 65 - 99 mg/dL   BUN 37 (H) 6 - 20 mg/dL   Creatinine, Ser 1.05 0.61 - 1.24 mg/dL   Calcium 8.9 8.9 - 10.3 mg/dL   GFR calc non Af Amer >60 >60 mL/min   GFR calc Af Amer >60 >60 mL/min    Comment: (NOTE) The eGFR has been calculated using the CKD EPI  equation. This calculation has not been validated in all clinical situations. eGFR's persistently <60 mL/min signify possible Chronic Kidney Disease.    Anion gap 10 5 - 15  CBC     Status: Abnormal   Collection Time: 04/29/17  1:42 AM  Result Value Ref Range   WBC 10.9 (H) 3.8 - 10.6 K/uL   RBC 3.88 (L) 4.40 - 5.90 MIL/uL   Hemoglobin 12.5 (L) 13.0 - 18.0 g/dL   HCT 35.2 (L) 40.0 - 52.0 %   MCV 90.7 80.0 - 100.0 fL   MCH 32.1 26.0 - 34.0 pg   MCHC 35.4 32.0 - 36.0 g/dL   RDW 15.2 (H) 11.5 - 14.5 %   Platelets 123 (L) 150 - 440 K/uL  Troponin I     Status: Abnormal   Collection Time: 04/29/17  1:42 AM  Result Value Ref Range   Troponin I 0.84 (HH) <0.03 ng/mL    Comment: CRITICAL RESULT CALLED TO, READ BACK BY AND VERIFIED WITH RACHAEL HAYDEN ON 04/29/17 AT 0228 MNS   TSH     Status: None   Collection Time: 04/29/17  5:51 AM  Result Value Ref Range   TSH 0.440 0.350 - 4.500 uIU/mL    Comment: Performed by a 3rd Generation assay with a functional sensitivity of <=0.01 uIU/mL.  Troponin I     Status: Abnormal   Collection Time: 04/29/17  5:51 AM  Result Value Ref Range   Troponin I 16.77 (HH) <0.03 ng/mL    Comment: CRITICAL RESULT CALLED TO, READ BACK BY AND VERIFIED WITH AMY DALTON AT 0726 04/29/17 ALV    Dg Chest 2 View  Result Date: 04/29/2017 CLINICAL DATA:  Chest tightness EXAM: CHEST  2 VIEW COMPARISON:  01/27/2017 FINDINGS: Post sternotomy changes. There is hyperinflation. Slight increased coarse interstitial opacities in the perihilar regions and bilateral lung bases. No focal consolidation or pleural effusion. Stable cardiomediastinal silhouette with atherosclerosis. No pneumothorax. IMPRESSION: Slight increased coarse interstitial perihilar and basilar opacity may reflect acute interstitial inflammation or edema on chronic change. Otherwise no significant interval change. Electronically Signed   By: Donavan Foil M.D.   On: 04/29/2017 01:48    Review of Systems   Constitutional: Negative for chills and fever.  HENT: Negative for sore throat and tinnitus.   Eyes: Negative for blurred vision and redness.  Respiratory: Negative for cough and shortness of breath.   Cardiovascular: Positive for chest pain. Negative for palpitations, orthopnea and PND.  Gastrointestinal: Positive for heartburn. Negative for abdominal pain, diarrhea, nausea and vomiting.  Genitourinary: Negative for dysuria, frequency and urgency.  Musculoskeletal: Negative for joint pain and myalgias.  Skin: Negative for rash.  No lesions  Neurological: Negative for speech change, focal weakness and weakness.  Endo/Heme/Allergies: Does not bruise/bleed easily.       No temperature intolerance  Psychiatric/Behavioral: Negative for depression and suicidal ideas.    Blood pressure (!) 128/56, pulse (!) 58, temperature 97.8 F (36.6 C), temperature source Oral, resp. rate 16, height _0  (1.727 m), weight 77.1 kg (170 lb), SpO2 100 %. Physical Exam  Constitutional: He is oriented to person, place, and time. He appears well-developed and well-nourished. No distress.  HENT:  Head: Normocephalic and atraumatic.  Mouth/Throat: Oropharynx is clear and moist.  Eyes: Conjunctivae and EOM are normal. Pupils are equal, round, and reactive to light. No scleral icterus.  Neck: Normal range of motion. Neck supple. No JVD present. No tracheal deviation present. No thyromegaly present.  Cardiovascular: Normal rate, regular rhythm and normal heart sounds.  Exam reveals no gallop and no friction rub.   No murmur heard. Respiratory: Effort normal and breath sounds normal.  GI: Soft. Bowel sounds are normal. He exhibits no distension. There is no tenderness.  Genitourinary:  Genitourinary Comments: Deferred  Musculoskeletal: Normal range of motion. He exhibits no edema.  Lymphadenopathy:    He has no cervical adenopathy.  Neurological: He is alert and oriented to person, place, and time. No  cranial nerve deficit.  Skin: Skin is warm and dry. No rash noted. No erythema.  Psychiatric: He has a normal mood and affect. His behavior is normal. Judgment and thought content normal.     Assessment/Plan This is an 83 year old male admitted for NSTEMI. 1. ACS: NSTEMI; at the time of admission troponin was 0.8 but has now increased to greater than 16 at time of dictation. Heparin drip started. The patient is nothing by mouth for heart catheterization. Cardiology on board. 2. CAD: Unstable, continue aspirin and Plavix. Plan as above. 3. Essential hypertension: Minimize cardiac oxygen demand. Continue Coreg and lisinopril. 4. Multiple myeloma: Continue lenalidomide and Decadron 5. BPH: Continue Proscar 6. Hyperlipidemia: Continue statin therapy The patient is a full code. Time spent on admission orders and patient care possibly 45 minutes  Harrie Foreman, MD 04/29/2017, 7:37 AM

## 2017-04-29 NOTE — Progress Notes (Signed)
ANTICOAGULATION CONSULT NOTE - Initial Consult  Pharmacy Consult for Heparin Drip  Indication: chest pain/ACS  Allergies  Allergen Reactions  . Antihistamines, Chlorpheniramine-Type Other (See Comments)    Reaction: unknown  . Nitroglycerin Other (See Comments)    Reaction: hypotension    Patient Measurements: Height: 5\' 8"  (172.7 cm) Weight: 170 lb (77.1 kg) IBW/kg (Calculated) : 68.4  Vital Signs: Temp: 97.8 F (36.6 C) (06/01 0126) Temp Source: Oral (06/01 0126) BP: 128/56 (06/01 0525) Pulse Rate: 58 (06/01 0525)  Labs:  Recent Labs  04/29/17 0142 04/29/17 0551  HGB 12.5*  --   HCT 35.2*  --   PLT 123*  --   CREATININE 1.05  --   TROPONINI 0.84* 16.77*    Estimated Creatinine Clearance: 50.7 mL/min (by C-G formula based on SCr of 1.05 mg/dL).   Medical History: Past Medical History:  Diagnosis Date  . Cancer ([place])    bone marrow, prostate cancer  . Hyperlipidemia   . MI (mitral incompetence)   . MI (myocardial infarction) [facility])    Assessment: Pharmacy consulted for heparin drip dosing and monitoring in a 83 yo male with chest pain.   Goal of Therapy:  Heparin level 0.3-0.7 units/ml Monitor platelets by anticoagulation protocol: Yes   Plan:  Baseline labs ordered Give 4000 units bolus x 1 Start heparin infusion at 950 units/hr Check anti-Xa level in 8 hours and daily while on heparin Continue to monitor H&H and platelets  Pernell Dupre, PharmD, BCPS Clinical Pharmacist 04/29/2017 7:50 AM

## 2017-04-29 NOTE — Progress Notes (Addendum)
Critical troponin of 16.77.  Nehemiah Massed paged.    Start heparin gtt, keep NPO.  Possible cardiac catheterization later today.

## 2017-04-29 NOTE — Progress Notes (Signed)
[facility] NAME: Dwayne Terry    MR#:  [id number]  DATE OF BIRTH:  09/16/1930  SUBJECTIVE:  CHIEF COMPLAINT:   Chief Complaint  Patient presents with  . Chest Pain   The patient is 83 year old Caucasian male with medical history significant for history of coronary artery disease, diabetes, stent to RCA in March 2018, who presents to the hospital with complaints of chest pain, radiating from one arm. 2. The other. On arrival to the hospital patient was noted to have elevated troponin to 0.84 and was admitted. Second troponin was found to be 16.77, third was almost 27. Patient underwent cardiac catheterization revealing 3 vessel coronary artery disease, occluded proximal LAD, proximal left circumflex, 95% stenosis in proximal RCA, 75% and mid RCA, 60% in distal RCA, PTCA to mid RCA was done, plus DES was placed with 50% residual stenosis to proximal RCA. She has no chest pains, now Review of Systems  Constitutional: Negative for chills, fever and weight loss.  HENT: Negative for congestion.   Eyes: Negative for blurred vision and double vision.  Respiratory: Negative for cough, sputum production, shortness of breath and wheezing.   Cardiovascular: Negative for chest pain, palpitations, orthopnea, leg swelling and PND.  Gastrointestinal: Negative for abdominal pain, blood in stool, constipation, diarrhea, nausea and vomiting.  Genitourinary: Negative for dysuria, frequency, hematuria and urgency.  Musculoskeletal: Negative for falls.  Neurological: Negative for dizziness, tremors, focal weakness and headaches.  Endo/Heme/Allergies: Does not bruise/bleed easily.  Psychiatric/Behavioral: Negative for depression. The patient does not have insomnia.     VITAL SIGNS: Blood pressure (!) 108/46, pulse (!) 48, temperature 98.1 F (36.7 C), temperature source Oral, resp. rate 12, height 5\' 8"  (1.727 m), weight 77.1 kg (170 lb), SpO2  99 %.  PHYSICAL EXAMINATION:   GENERAL:  83 y.o.-year-old patient lying in the bed with no acute distress.  EYES: Pupils equal, round, reactive to light and accommodation. No scleral icterus. Extraocular muscles intact.  HEENT: Head atraumatic, normocephalic. Oropharynx and nasopharynx clear.  NECK:  Supple, no jugular venous distention. No thyroid enlargement, no tenderness.  LUNGS: Normal breath sounds bilaterally, no wheezing, rales,rhonchi or crepitation. No use of accessory muscles of respiration.  CARDIOVASCULAR: S1, S2 normal. No murmurs, rubs, or gallops.  ABDOMEN: Soft, nontender, nondistended. Bowel sounds present. No organomegaly or mass.  EXTREMITIES: No pedal edema, cyanosis, or clubbing.  NEUROLOGIC: Cranial nerves II through XII are intact. Muscle strength 5/5 in all extremities. Sensation intact. Gait not checked.  PSYCHIATRIC: The patient is alert and oriented x 3.  SKIN: No obvious rash, lesion, or ulcer.   ORDERS/RESULTS REVIEWED:   CBC  Recent Labs Lab 04/29/17 0142  WBC 10.9*  HGB 12.5*  HCT 35.2*  PLT 123*  MCV 90.7  MCH 32.1  MCHC 35.4  RDW 15.2*   ------------------------------------------------------------------------------------------------------------------  Chemistries   Recent Labs Lab 04/29/17 0142  NA 139  K 3.7  CL 107  CO2 22  GLUCOSE 242*  BUN 37*  CREATININE 1.05  CALCIUM 8.9   ------------------------------------------------------------------------------------------------------------------ estimated creatinine clearance is 50.7 mL/min (by C-G formula based on SCr of 1.05 mg/dL). ------------------------------------------------------------------------------------------------------------------  Recent Labs  04/29/17 0551  TSH 0.440    Cardiac Enzymes  Recent Labs Lab 04/29/17 0142 04/29/17 0551 04/29/17 1136  TROPONINI 0.84* 16.77* 26.75*    ------------------------------------------------------------------------------------------------------------------ Invalid input(s): POCBNP ---------------------------------------------------------------------------------------------------------------  RADIOLOGY: Dg Chest 2 View  Result Date: 04/29/2017 CLINICAL DATA:  Chest tightness EXAM: CHEST  2 VIEW COMPARISON:  01/27/2017 FINDINGS: Post sternotomy changes. There is hyperinflation. Slight increased coarse interstitial opacities in the perihilar regions and bilateral lung bases. No focal consolidation or pleural effusion. Stable cardiomediastinal silhouette with atherosclerosis. No pneumothorax. IMPRESSION: Slight increased coarse interstitial perihilar and basilar opacity may reflect acute interstitial inflammation or edema on chronic change. Otherwise no significant interval change. Electronically Signed   By: Donavan Foil M.D.   On: 04/29/2017 01:48    EKG:  Orders placed or performed during the hospital encounter of 04/29/17  . EKG 12-Lead  . EKG 12-Lead  . ED EKG within 10 minutes  . ED EKG within 10 minutes    ASSESSMENT AND PLAN:  Active Problems:   Chest pain   NSTEMI (non-ST elevated myocardial infarction) ([place])  #1. Non-STEMI with inferior wall hypokinesis on cardiac catheterization 04/29/2017 by Dr. Nehemiah Massed, revealing severe triple-vessel coronary artery disease, status post PTCA to mid RCA, DDS placement with 50% residual stenosis to the proximal RCA, continue aspirin, Plavix, Lovenox, Lipitor #2. Essential hypertension, well controlled on Coreg and Zestril #3. Bradycardia, due to Coreg, observe closely, decrease dose if needed #4. Leukocytosis, follow in the morning #5. Thrombus cytopenia, likely consumption, follow in the morning #6. Diabetes mellitus with hyperglycemia, initiate patient on sliding scale insulin, hold metformin, get hemoglobin A1c, metformin to be on hold for the next 48 hours, discussed this with  the patient, he was understanding  Management plans discussed with the patient, family and they are in agreement.   DRUG ALLERGIES:  Allergies  Allergen Reactions  . Antihistamines, Chlorpheniramine-Type Other (See Comments)    Reaction: unknown  . Nitroglycerin Other (See Comments)    Reaction: hypotension    CODE STATUS:     Code Status Orders        Start     Ordered   04/29/17 0513  Full code  Continuous     04/29/17 0512    Code Status History    Date Active Date Inactive Code Status Order ID Comments User Context   01/27/2017 11:56 PM 01/29/2017  2:30 PM Full Code [id number]  Hugelmeyer, Ubaldo Glassing, DO Inpatient    Advance Directive Documentation     Most Recent Value  Type of Advance Directive  Healthcare Power of [place], Living will  Pre-existing out of facility DNR order (yellow form or pink MOST form)  -  "MOST" Form in Place?  -      TOTAL TIME TAKING CARE OF THIS PATIENT: 40 minutes.    Theodoro Grist M.D on 04/29/2017 at 5:16 PM  Between 7am to 6pm - Pager - [phone]  After 6pm go to [email or web address] - password [email or web address] [facility]  [facility]  Office  [phone]  CC: Primary care physician; Kirk Ruths, MD

## 2017-04-29 NOTE — ED Triage Notes (Signed)
Pt presents to ED with chest tightness that started around 1600-1700 while riding his lawn mower. Pt states he has a significant cardiac hx including "8 heart attacks". Denies sob, dizziness, or nausea. Pt alert and talkative at this time. Pt states he thought he may have reflux and has been belching and is not sure if his "stomach gases" are what may be causing his pain.

## 2017-04-29 NOTE — ED Notes (Addendum)
Pt reports 8 heart attacks and follows up with Dr. Saralyn Pilar for cardiac treatment. Pt also states in March he had a stent replaced with a metal one per pt.

## 2017-04-29 NOTE — ED Notes (Addendum)
Date and time results received: 04/29/17 0228 Dwayne Terry, Lab  Test: Trop I Critical Value: 0.084  Name of Provider Notified: R. Owens Shark, MD  Orders Received? Or Actions Taken?: No new at this time.

## 2017-04-29 NOTE — Consult Note (Signed)
[facility] Consultation Note  Patient ID: Dwayne Terry, MRN: [id number], DOB/AGE: 12-07-1929 83 y.o. Admit date: 04/29/2017   Date of Consult: 04/29/2017 Primary Physician: Kirk Ruths, MD Primary Cardiologist: Paraschos  Chief Complaint:  Chief Complaint  Patient presents with  . Chest Pain   Reason for Consult: myocardial infarction  HPI: 83 y.o. male with known coronary artery disease status post coronary artery bypass graft in 1998 with multiple myocardial infarctions over. Of time. The patient has been on appropriate medication management for essential hypertension mixed hyperlipidemia and sleep apnea. The patient additionally has had multiple stents in his right coronary artery for which in March 2018 had a non-ST elevation myocardial infarction with a significant stenosis of proximal right coronary artery bare metal stent was placed with apparent good result and the patient was placed on appropriate medication. Since then he is recently been taking intermittent dosages of Plavix with aspirin and has had severe abdominal discomfort and gas in the type symptoms off and on for the last several days culminating in severe chest pressure and pain with abdominal discomfort for many hours. EKG has shown normal sinus rhythm with inferior infarct and lateral ST depressions not significant change from before. The patient does have now a troponin of 16 consistent with a non-ST elevation myocardial infarction. He does feel much better at this time with no further significant symptoms or signs of heart failure  Past Medical History:  Diagnosis Date  . Cancer ([place])    bone marrow, prostate cancer  . Hyperlipidemia   . MI (mitral incompetence)   . MI (myocardial infarction) [facility])       Surgical History:  Past Surgical History:  Procedure Laterality Date  . AORTIC VALVE REPLACEMENT (AVR)/CORONARY ARTERY BYPASS GRAFTING (CABG)    . CORONARY ANGIOPLASTY WITH STENT PLACEMENT     . CORONARY STENT INTERVENTION N/A 01/28/2017   Procedure: Coronary Stent Intervention;  Surgeon: [name], MD;  Location: [place] [facility];  Service: Cardiovascular;  Laterality: N/A;  . LEFT HEART CATH AND CORONARY ANGIOGRAPHY N/A 01/28/2017   Procedure: Left Heart Cath and Coronary Angiography;  Surgeon: [name], MD;  Location: [place] [facility];  Service: Cardiovascular;  Laterality: N/A;     Home Meds: Prior to Admission medications   Medication Sig Start Date End Date Taking? Authorizing Provider  lenalidomide (REVLIMID) 15 MG capsule Take 15 mg by mouth daily.   Yes [provider]  acetaminophen (TYLENOL) 325 MG tablet Take 650 mg by mouth every 6 (six) hours as needed.    [provider]  aspirin EC 81 MG tablet Take 1 tablet (81 mg total) by mouth daily. 01/29/17   Loletha Grayer, MD  atorvastatin (LIPITOR) 80 MG tablet Take 80 mg by mouth daily at 6 PM.    [provider]  carvedilol (COREG) 6.25 MG tablet Take 1 tablet (6.25 mg total) by mouth 2 (two) times daily with a meal. 01/29/17   Loletha Grayer, MD  clopidogrel (PLAVIX) 75 MG tablet Take 1 tablet (75 mg total) by mouth daily with breakfast. 01/30/17   Loletha Grayer, MD  dexamethasone (DECADRON) 4 MG tablet Take 4 mg by mouth once a week.    [provider]  finasteride (PROSCAR) 5 MG tablet Take 5 mg by mouth daily.    [provider]  lisinopril (PRINIVIL,ZESTRIL) 40 MG tablet Take 40 mg by mouth daily.    [provider]  Multiple Vitamin (MULTI-VITAMINS) TABS Take 1  tablet by mouth daily.    [provider]  ondansetron (ZOFRAN) 8 MG tablet Take 8 mg by mouth every 8 (eight) hours as needed for nausea or vomiting.    [provider]    Inpatient Medications:  . aspirin EC  81 mg Oral Daily  . atorvastatin  80 mg Oral q1800  . carvedilol  6.25 mg Oral BID WC  . clopidogrel  75 mg Oral Q breakfast  . [START ON 05/11/2017]  dexamethasone  4 mg Oral Weekly  . docusate sodium  100 mg Oral BID  . finasteride  5 mg Oral Daily  . heparin  4,000 Units Intravenous Once  . lenalidomide  15 mg Oral Daily  . lisinopril  40 mg Oral Daily  . multivitamin with minerals  1 tablet Oral Daily   . sodium chloride 50 mL/hr at 04/29/17 0736  . heparin      Allergies:  Allergies  Allergen Reactions  . Antihistamines, Chlorpheniramine-Type Other (See Comments)    Reaction: unknown  . Nitroglycerin Other (See Comments)    Reaction: hypotension    Social History   Social History  . Marital status: Married    Spouse name: N/A  . Number of children: N/A  . Years of education: N/A   Occupational History  . Not on file.   Social History Main Topics  . Smoking status: Former Research scientist (life sciences)  . Smokeless tobacco: Former Systems developer  . Alcohol use No  . Drug use: No  . Sexual activity: Not on file   Other Topics Concern  . Not on file   Social History Narrative  . No narrative on file     Family History  Problem Relation Age of Onset  . Heart disease Other      Review of Systems Positive for Chest pain shortness of breath Negative for: General:  chills, fever, night sweats or weight changes.  Cardiovascular: PND orthopnea syncope dizziness  Dermatological skin lesions rashes Respiratory: Cough congestion Urologic: Frequent urination urination at night and hematuria Abdominal: negative for nausea, vomiting, diarrhea, bright red blood per rectum, melena, or hematemesis Neurologic: negative for visual changes, and/or hearing changes  All other systems reviewed and are otherwise negative except as noted above.  Labs:  Recent Labs  04/29/17 0142 04/29/17 0551  TROPONINI 0.84* 16.77*   Lab Results  Component Value Date   WBC 10.9 (H) 04/29/2017   HGB 12.5 (L) 04/29/2017   HCT 35.2 (L) 04/29/2017   MCV 90.7 04/29/2017   PLT 123 (L) 04/29/2017    Recent Labs Lab 04/29/17 0142  NA 139  K 3.7  CL 107  CO2  22  BUN 37*  CREATININE 1.05  CALCIUM 8.9  GLUCOSE 242*   Lab Results  Component Value Date   CHOL 110 07/08/2013   HDL 30 (L) 07/08/2013   LDLCALC 60 07/08/2013   TRIG 101 07/08/2013   No results found for: DDIMER  Radiology/Studies:  Dg Chest 2 View  Result Date: 04/29/2017 CLINICAL DATA:  Chest tightness EXAM: CHEST  2 VIEW COMPARISON:  01/27/2017 FINDINGS: Post sternotomy changes. There is hyperinflation. Slight increased coarse interstitial opacities in the perihilar regions and bilateral lung bases. No focal consolidation or pleural effusion. Stable cardiomediastinal silhouette with atherosclerosis. No pneumothorax. IMPRESSION: Slight increased coarse interstitial perihilar and basilar opacity may reflect acute interstitial inflammation or edema on chronic change. Otherwise no significant interval change. Electronically Signed   By: Donavan Foil M.D.   On: 04/29/2017 01:48  EKG: Normal sinus rhythm with inferior infarct and lateral ST depression  Weights: Filed Weights   04/29/17 0118  Weight: 77.1 kg (170 lb)     Physical Exam: Blood pressure (!) 128/56, pulse (!) 58, temperature 97.8 F (36.6 C), temperature source Oral, resp. rate 16, height 5\' 8"  (1.727 m), weight 77.1 kg (170 lb), SpO2 100 %. Body mass index is 25.85 kg/m. General: Well developed, well nourished, in no acute distress. Head eyes ears nose throat: Normocephalic, atraumatic, sclera non-icteric, no xanthomas, nares are without discharge. No apparent thyromegaly and/or mass  Lungs: Normal respiratory effort.  no wheezes, no rales, no rhonchi.  Heart: RRR with normal S1 S2. no murmur gallop, no rub, PMI is normal size and placement, carotid upstroke normal without bruit, jugular venous pressure is normal Abdomen: Soft, non-tender, non-distended with normoactive bowel sounds. No hepatomegaly. No rebound/guarding. No obvious abdominal masses. Abdominal aorta is normal size without bruit Extremities: Trace  edema. no cyanosis, no clubbing, no ulcers  Peripheral : 2+ bilateral upper extremity pulses, 2+ bilateral femoral pulses, 2+ bilateral dorsal pedal pulse Neuro: Alert and oriented. No facial asymmetry. No focal deficit. Moves all extremities spontaneously. Musculoskeletal: Normal muscle tone without kyphosis Psych:  Responds to questions appropriately with a normal affect.    Assessment: 83 year old male with known coronary disease eschar coronary bypass graft multiple stents and a non-ST elevation myocardial infarction in March 2018 status post PCI and stent placement now with chest pain shortness of breath and repeat and non-ST elevation myocardial infarction possibly consistent with inferior infarct and/or thrombosis of stent without evidence of myocardial heart failure  Plan: 1. Continue heparin for further risk reduction in myocardial infarction and/or in-stent thrombosis 2. Continue ACE inhibitor beta blocker for further treatment of cardiomyopathy and heart dysfunction 3. High intensity cholesterol therapy with atorvastatin 4. Echocardiogram for LV systolic dysfunction valvular heart disease contributing to above 5. Proceed to cardiac catheterization to assess coronary anatomy and further treatment thereof is necessary. Patient understands risk and benefits of cardiac catheterization. This includes a possibility of death stroke heart attack infection bleeding blood clot. He is at low risk for conscious sedation  Signed, Corey Skains M.D. [facility] 04/29/2017, 8:44 AM

## 2017-04-29 NOTE — Progress Notes (Signed)
Pt arrived from ED alert and oriented, wife at bedside. Skin and telemetry verified with Clifton Custard. VSS, no SOB or c/o pain. No concerns offered at this time.

## 2017-04-29 NOTE — Progress Notes (Signed)
Patient requested transfer to [facility] for cardiac catheterization unless his primary doctor could do his cardiac catheterization.  Dr. Saralyn Pilar agreeable to cath patient later today.  Patient agreeable to stay and do cardiac catheterization at [facility].  Cath lab informed.  Transfer center informed.    Consent obtained.  Heparin gtt infusing.

## 2017-04-29 NOTE — Progress Notes (Signed)
Inpatient Diabetes Program Recommendations  AACE/ADA: New Consensus Statement on Inpatient Glycemic Control (2015)  Target Ranges:  Prepandial:   less than 140 mg/dL      Peak postprandial:   less than 180 mg/dL (1-2 hours)      Critically ill patients:  140 - 180 mg/dL   Results for KORD, MONETTE (MRN [id number]) as of 04/29/2017 09:39  Ref. Range 04/29/2017 01:42  Glucose Latest Ref Range: 65 - 99 mg/dL 242 (H)   Review of Glycemic Control  Diabetes history: None Current orders for Inpatient glycemic control: None  Inpatient Diabetes Program Recommendations:    Lab glucose 242 mg/dl at 0142 am. Please consider CBGs and possibly Novolog Sensitive Correction tid. Note patient ordered Decadron 4 mg Weekly for home.  A1c in process  Thanks,  Tama Headings RN, MSN, [facility] Inpatient Diabetes Coordinator Team Pager [phone] (8a-5p)

## 2017-04-30 LAB — CBC
HEMATOCRIT: 28.2 % — AB (ref 40.0–52.0)
HEMOGLOBIN: 9.8 g/dL — AB (ref 13.0–18.0)
MCH: 31.3 pg (ref 26.0–34.0)
MCHC: 34.6 g/dL (ref 32.0–36.0)
MCV: 90.5 fL (ref 80.0–100.0)
Platelets: 86 10*3/uL — ABNORMAL LOW (ref 150–440)
RBC: 3.12 MIL/uL — AB (ref 4.40–5.90)
RDW: 15.4 % — ABNORMAL HIGH (ref 11.5–14.5)
WBC: 5.5 10*3/uL (ref 3.8–10.6)

## 2017-04-30 LAB — CREATININE, SERUM
Creatinine, Ser: 0.79 mg/dL (ref 0.61–1.24)
GFR calc Af Amer: >60 mL/min (ref >60)

## 2017-04-30 LAB — HEMOGLOBIN A1C
Hgb A1c MFr Bld: 6.1 % — ABNORMAL HIGH (ref 4.8–5.6)
Mean Plasma Glucose: 128 mg/dL

## 2017-04-30 LAB — GLUCOSE, CAPILLARY: Glucose-Capillary: 96 mg/dL (ref 65–99)

## 2017-04-30 MED ORDER — CLOPIDOGREL BISULFATE 75 MG PO TABS: 75.0000 mg | ORAL_TABLET | Freq: Every day | ORAL | 1 refills | Status: DC

## 2017-04-30 NOTE — Discharge Instructions (Signed)

## 2017-04-30 NOTE — Progress Notes (Signed)
[facility] Encounter Note  Patient: Dwayne Terry / Admit Date: 04/29/2017 / Date of Encounter: 04/30/2017, 6:31 AM   Subjective: Patient is feeling much better today on appropriate medication management status post non-ST elevation myocardial infarction Patient underwent a PCI and stent placement of proximal right coronary artery due to restenosis without complication. Overall heart dysfunction was significant with ejection fraction of 35-40%  Review of Systems: Positive for: Weakness Negative for: Vision change, hearing change, syncope, dizziness, nausea, vomiting,diarrhea, bloody stool, stomach pain, cough, congestion, diaphoresis, urinary frequency, urinary pain,skin lesions, skin rashes Others previously listed  Objective: Telemetry: Normal sinus rhythm Physical Exam: Blood pressure (!) 101/59, pulse (!) 50, temperature 97.8 F (36.6 C), temperature source Oral, resp. rate 18, height 5\' 8"  (1.727 m), weight 76.9 kg (169 lb 9.6 oz), SpO2 97 %. Body mass index is 25.79 kg/m. General: Well developed, well nourished, in no acute distress. Head: Normocephalic, atraumatic, sclera non-icteric, no xanthomas, nares are without discharge. Neck: No apparent masses Lungs: Normal respirations with no wheezes, no rhonchi, no rales , no crackles   Heart: Regular rate and rhythm, normal S1 S2, no murmur, no rub, no gallop, PMI is normal size and placement, carotid upstroke normal without bruit, jugular venous pressure normal Abdomen: Soft, non-tender, non-distended with normoactive bowel sounds. No hepatosplenomegaly. Abdominal aorta is normal size without bruit Extremities: No edema, no clubbing, no cyanosis, no ulcers,  Peripheral: 2+ radial, 2+ femoral, 2+ dorsal pedal pulses Neuro: Alert and oriented. Moves all extremities spontaneously. Psych:  Responds to questions appropriately with a normal affect.   Intake/Output Summary (Last 24 hours) at 04/30/17 0631 Last data  filed at 04/30/17 0419  Gross per 24 hour  Intake              696 ml  Output              450 ml  Net              246 ml    Inpatient Medications:  . aspirin EC  81 mg Oral Daily  . atorvastatin  80 mg Oral q1800  . carvedilol  6.25 mg Oral BID WC  . clopidogrel  75 mg Oral Q breakfast  . [START ON 05/04/2017] dexamethasone  20 mg Oral Weekly  . docusate sodium  100 mg Oral BID  . enoxaparin (LOVENOX) injection  40 mg Subcutaneous Q24H  . finasteride  5 mg Oral Daily  . insulin aspart  0-5 Units Subcutaneous QHS  . insulin aspart  0-9 Units Subcutaneous TID WC  . insulin aspart  3 Units Subcutaneous TID WC  . lenalidomide  25 mg Oral Daily  . lisinopril  40 mg Oral Daily  . multivitamin with minerals  1 tablet Oral Daily   Infusions:  . sodium chloride 50 mL/hr at 04/29/17 2330  . tirofiban 0.075 mcg/kg/min (04/29/17 2012)    Labs:  Recent Labs  04/29/17 0142 04/30/17 0450  NA 139  --   K 3.7  --   CL 107  --   CO2 22  --   GLUCOSE 242*  --   BUN 37*  --   CREATININE 1.05 0.79  CALCIUM 8.9  --    No results for input(s): AST, ALT, ALKPHOS, BILITOT, PROT, ALBUMIN in the last 72 hours.  Recent Labs  04/29/17 0142 04/30/17 0450  WBC 10.9* 5.5  HGB 12.5* 9.8*  HCT 35.2* 28.2*  MCV 90.7 90.5  PLT 123* 86*  Recent Labs  04/29/17 0142 04/29/17 0551 04/29/17 1136 04/29/17 1725  TROPONINI 0.84* 16.77* 26.75* 27.53*   Invalid input(s): POCBNP  Recent Labs  04/29/17 0551  HGBA1C 6.1*     Weights: Filed Weights   04/29/17 0118 04/30/17 0500  Weight: 77.1 kg (170 lb) 76.9 kg (169 lb 9.6 oz)     Radiology/Studies:  Dg Chest 2 View  Result Date: 04/29/2017 CLINICAL DATA:  Chest tightness EXAM: CHEST  2 VIEW COMPARISON:  01/27/2017 FINDINGS: Post sternotomy changes. There is hyperinflation. Slight increased coarse interstitial opacities in the perihilar regions and bilateral lung bases. No focal consolidation or pleural effusion. Stable  cardiomediastinal silhouette with atherosclerosis. No pneumothorax. IMPRESSION: Slight increased coarse interstitial perihilar and basilar opacity may reflect acute interstitial inflammation or edema on chronic change. Otherwise no significant interval change. Electronically Signed   By: Donavan Foil M.D.   On: 04/29/2017 01:48     Assessment and Recommendation  83 y.o. male with known coronary artery disease status post coronary bypass graft and multiple stents in the right coronary artery now with restenosis of proximal right coronary artery stent and inferior myocardial infarction now status post further intervention of multiple stents in proximal right coronary artery and improved 1. Continue Plavix and aspirin indefinitely due to multiple stents in right coronary artery 2. Medication management for high intensity cholesterol therapy with atorvastatin 3. ACE inhibitor and beta blocker as able for myocardial infarction depending on blood pressure and heart rate 4. Begin ambulation and follow for improvements of symptoms and possible discharged to home in the morning if able  Signed, Serafina Royals M.D. FACC

## 2017-04-30 NOTE — Discharge Summary (Signed)
[facility] [place] [place] NAME: Dwayne Terry    MR#:  [id number]  DATE OF BIRTH:  22-Sep-1930  DATE OF ADMISSION:  04/29/2017 ADMITTING PHYSICIAN: Harrie Foreman, MD  DATE OF DISCHARGE: 04/30/2017 11:32 AM  PRIMARY CARE PHYSICIAN: Kirk Ruths, MD    ADMISSION DIAGNOSIS:  Chest tightness Non-ST elevation myocardial infarction  DISCHARGE DIAGNOSIS:  Active Problems:   Chest pain   NSTEMI (non-ST elevated myocardial infarction) ([place])   SECONDARY DIAGNOSIS:   Past Medical History:  Diagnosis Date  . Cancer ([place])    bone marrow, prostate cancer  . Hyperlipidemia   . MI (mitral incompetence)   . MI (myocardial infarction) [facility])     HOSPITAL COURSE:   83 year old male with past medical history of prostate cancer with metastatic disease, previous history of MI, hypertension, hyperlipidemiachronic anemia/thrombocytopenia due to Multiple Myeloma presented to the hospital due to chest pain/tightness.  1. Non-ST elevation MI-this was the cause of patient's chest pain/tightness. Patient ruled in by cardiac markers. -Patient was seen by cardiology and underwent a cardiac catheterization and is status post PCI and stent placement to the distal RCA. Patient is currently chest pain-free and hemodynamically stable. -He's being discharged on aspirin, Plavix, atorvastatin, lisinopril, carvedilol with close follow-up with cardiology as an outpatient.  2. History of chronic anemia and thrombocytopenia-this is secondary to his Multiple Myeloma. -Continue follow-up with oncology as an outpatient. Continue antiplatelet therapy given his non-ST elevation MI.  3. History of prostate cancer/Multiple Myeloma-continue follow-up with oncology.  4. BPH-continue finasteride  5. Essential hypertension-patient will continue his carvedilol, lisinopril.  DISCHARGE CONDITIONS:   Stable  CONSULTS OBTAINED:  Treatment Team:  Corey Skains, MD  DRUG  ALLERGIES:   Allergies  Allergen Reactions  . Antihistamines, Chlorpheniramine-Type Other (See Comments)    Reaction: unknown  . Nitroglycerin Other (See Comments)    Reaction: hypotension    DISCHARGE MEDICATIONS:   Allergies as of 04/30/2017      Reactions   Antihistamines, Chlorpheniramine-type Other (See Comments)   Reaction: unknown   Nitroglycerin Other (See Comments)   Reaction: hypotension      Medication List    STOP taking these medications   ondansetron 8 MG tablet Commonly known as:  ZOFRAN     TAKE these medications   acetaminophen 325 MG tablet Commonly known as:  TYLENOL Take 650 mg by mouth every 6 (six) hours as needed.   aspirin EC 81 MG tablet Take 1 tablet (81 mg total) by mouth daily.   atorvastatin 80 MG tablet Commonly known as:  LIPITOR Take 80 mg by mouth daily at 6 PM.   carvedilol 6.25 MG tablet Commonly known as:  COREG Take 1 tablet (6.25 mg total) by mouth 2 (two) times daily with a meal.   clopidogrel 75 MG tablet Commonly known as:  PLAVIX Take 1 tablet (75 mg total) by mouth daily with breakfast.   dexamethasone 4 MG tablet Commonly known as:  DECADRON Take 20 mg by mouth once a week. On wednesdays   finasteride 5 MG tablet Commonly known as:  PROSCAR Take 5 mg by mouth daily.   lenalidomide 15 MG capsule Commonly known as:  REVLIMID Take 25 mg by mouth daily.   lisinopril 40 MG tablet Commonly known as:  PRINIVIL,ZESTRIL Take 40 mg by mouth daily.   meloxicam 7.5 MG tablet Commonly known as:  MOBIC Take 7.5 mg by mouth daily.   MULTI-VITAMINS Tabs Take 1 tablet  by mouth daily.   simethicone 80 MG chewable tablet Commonly known as:  MYLICON Chew 80 mg by mouth every 6 (six) hours as needed for flatulence.         DISCHARGE INSTRUCTIONS:   DIET:  Cardiac diet  DISCHARGE CONDITION:  Stable  ACTIVITY:  Activity as tolerated  OXYGEN:  Home Oxygen: No.   Oxygen Delivery: room air  DISCHARGE  LOCATION:  home   If you experience worsening of your admission symptoms, develop shortness of breath, life threatening emergency, suicidal or homicidal thoughts you must seek medical attention immediately by calling 911 or calling your MD immediately  if symptoms less severe.  You Must read complete instructions/literature along with all the possible adverse reactions/side effects for all the Medicines you take and that have been prescribed to you. Take any new Medicines after you have completely understood and accpet all the possible adverse reactions/side effects.   Please note  You were cared for by a hospitalist during your hospital stay. If you have any questions about your discharge medications or the care you received while you were in the hospital after you are discharged, you can call the unit and asked to speak with the hospitalist on call if the hospitalist that took care of you is not available. Once you are discharged, your primary care physician will handle any further medical issues. Please note that NO REFILLS for any discharge medications will be authorized once you are discharged, as it is imperative that you return to your primary care physician (or establish a relationship with a primary care physician if you do not have one) for your aftercare needs so that they can reassess your need for medications and monitor your lab values.     Today   No acute chest pain.  No shortness of breath/N/V or any other complaints. Wants to go home.   VITAL SIGNS:  Blood pressure (!) 101/59, pulse (!) 50, temperature 97.8 F (36.6 C), temperature source Oral, resp. rate 18, height 5' 8" (1.727 m), weight 76.9 kg (169 lb 9.6 oz), SpO2 97 %.  I/O:   Intake/Output Summary (Last 24 hours) at 04/30/17 1506 Last data filed at 04/30/17 1008  Gross per 24 hour  Intake             1056 ml  Output              750 ml  Net              306 ml    PHYSICAL EXAMINATION:  GENERAL:  83  y.o.-year-old patient lying in the bed with no acute distress.  EYES: Pupils equal, round, reactive to light and accommodation. No scleral icterus. Extraocular muscles intact.  HEENT: Head atraumatic, normocephalic. Oropharynx and nasopharynx clear.  NECK:  Supple, no jugular venous distention. No thyroid enlargement, no tenderness.  LUNGS: Normal breath sounds bilaterally, no wheezing, rales,rhonchi. No use of accessory muscles of respiration.  CARDIOVASCULAR: S1, S2 normal. No murmurs, rubs, or gallops.  ABDOMEN: Soft, non-tender, non-distended. Bowel sounds present. No organomegaly or mass.  EXTREMITIES: No pedal edema, cyanosis, or clubbing.  NEUROLOGIC: Cranial nerves II through XII are intact. No focal motor or sensory defecits b/l.  PSYCHIATRIC: The patient is alert and oriented x 3. Good affect.  SKIN: No obvious rash, lesion, or ulcer.   DATA REVIEW:   CBC  Recent Labs Lab 04/30/17 0450  WBC 5.5  HGB 9.8*  HCT 28.2*  PLT 86*  Chemistries   Recent Labs Lab 04/29/17 0142 04/30/17 0450  NA 139  --   K 3.7  --   CL 107  --   CO2 22  --   GLUCOSE 242*  --   BUN 37*  --   CREATININE 1.05 0.79  CALCIUM 8.9  --     Cardiac Enzymes  Recent Labs Lab 04/29/17 1725  TROPONINI 27.53*    Microbiology Results  Results for orders placed or performed in visit on 07/07/13  Culture, blood (single)     Status: None   Collection Time: 07/07/13  9:20 PM  Result Value Ref Range Status   Micro Text Report   Final       COMMENT                   NO GROWTH AEROBICALLY/ANAEROBICALLY IN 5 DAYS   ANTIBIOTIC                                                      Culture, blood (single)     Status: None   Collection Time: 07/07/13  9:30 PM  Result Value Ref Range Status   Micro Text Report   Final       COMMENT                   NO GROWTH AEROBICALLY/ANAEROBICALLY IN 5 DAYS   ANTIBIOTIC                                                        RADIOLOGY:  Dg Chest 2  View  Result Date: 04/29/2017 CLINICAL DATA:  Chest tightness EXAM: CHEST  2 VIEW COMPARISON:  01/27/2017 FINDINGS: Post sternotomy changes. There is hyperinflation. Slight increased coarse interstitial opacities in the perihilar regions and bilateral lung bases. No focal consolidation or pleural effusion. Stable cardiomediastinal silhouette with atherosclerosis. No pneumothorax. IMPRESSION: Slight increased coarse interstitial perihilar and basilar opacity may reflect acute interstitial inflammation or edema on chronic change. Otherwise no significant interval change. Electronically Signed   By: Donavan Foil M.D.   On: 04/29/2017 01:48      Management plans discussed with the patient, family and they are in agreement.  CODE STATUS:  Code Status History    Date Active Date Inactive Code Status Order ID Comments User Context   04/29/2017  5:12 AM 04/30/2017  2:37 PM Full Code [id number]  Harrie Foreman, MD Inpatient   01/27/2017 11:56 PM 01/29/2017  2:30 PM Full Code [id number]  Hugelmeyer, Ubaldo Glassing, DO Inpatient    Advance Directive Documentation     Most Recent Value  Type of Advance Directive  Healthcare Power of [place], Living will  Pre-existing out of facility DNR order (yellow form or pink MOST form)  -  "MOST" Form in Place?  -      TOTAL TIME TAKING CARE OF THIS PATIENT: 40 minutes.    Henreitta Leber M.D on 04/30/2017 at 3:06 PM  Between 7am to 6pm - Pager - [phone]  After 6pm go to [email or web address] - [employer] brewer [facility]  Office  [phone]  CC: Primary care physician; Ouida Sills,  Ocie Cornfield, MD

## 2017-05-02 ENCOUNTER — Encounter: Payer: Self-pay | Admitting: Cardiology

## 2017-05-06 NOTE — ED Provider Notes (Signed)
[facility] Emergency Department Provider Note    First MD Initiated Contact with Patient 04/29/17 0142     (approximate)  I have reviewed the triage vital signs and the nursing notes.   HISTORY  Chief Complaint Chest Pain   HPI Dwayne Terry is a 83 y.o. male with blow list of chronic medical conditions including myocardial infarction requiring stent placement presents to the emergency department with chest pain that the patient states radiated from "one armpit to the next which occurred while the patient was riding his lawnmower. Patient describes the pain as pressure, squeezing sensation of the chest. Patient states his current pain score is 4 out of 10. Patient states that he took his reflux medication at home without any improvement of pain. Patient denies any dyspnea no nausea or vomiting no diaphoresis.   Past Medical History:  Diagnosis Date  . Cancer ([place])    bone marrow, prostate cancer  . Hyperlipidemia   . MI (mitral incompetence)   . MI (myocardial infarction) [facility])     Patient Active Problem List   Diagnosis Date Noted  . Chest pain 04/29/2017  . NSTEMI (non-ST elevated myocardial infarction) ([place]) 04/29/2017  . Chest pain, rule out acute myocardial infarction 01/27/2017    Past Surgical History:  Procedure Laterality Date  . AORTIC VALVE REPLACEMENT (AVR)/CORONARY ARTERY BYPASS GRAFTING (CABG)    . CORONARY ANGIOPLASTY WITH STENT PLACEMENT    . CORONARY STENT INTERVENTION N/A 01/28/2017   Procedure: Coronary Stent Intervention;  Surgeon: [name], MD;  Location: [place] [facility];  Service: Cardiovascular;  Laterality: N/A;  . CORONARY STENT INTERVENTION N/A 04/29/2017   Procedure: Coronary Stent Intervention;  Surgeon: [name], MD;  Location: [place] [facility];  Service: Cardiovascular;  Laterality: N/A;  . LEFT HEART CATH AND CORONARY ANGIOGRAPHY N/A 01/28/2017   Procedure: Left Heart Cath and Coronary  Angiography;  Surgeon: [name], MD;  Location: [place] [facility];  Service: Cardiovascular;  Laterality: N/A;  . LEFT HEART CATH AND CORS/GRAFTS ANGIOGRAPHY N/A 04/29/2017   Procedure: Left Heart Cath and Cors/Grafts Angiography;  Surgeon: [name], MD;  Location: [place] [facility];  Service: Cardiovascular;  Laterality: N/A;    Prior to Admission medications   Medication Sig Start Date End Date Taking? Authorizing Provider  acetaminophen (TYLENOL) 325 MG tablet Take 650 mg by mouth every 6 (six) hours as needed.   Yes [provider]  aspirin EC 81 MG tablet Take 1 tablet (81 mg total) by mouth daily. 01/29/17  Yes Loletha Grayer, MD  atorvastatin (LIPITOR) 80 MG tablet Take 80 mg by mouth daily at 6 PM.   Yes [provider]  carvedilol (COREG) 6.25 MG tablet Take 1 tablet (6.25 mg total) by mouth 2 (two) times daily with a meal. 01/29/17  Yes Wieting, Richard, MD  finasteride (PROSCAR) 5 MG tablet Take 5 mg by mouth daily.   Yes [provider]  lenalidomide (REVLIMID) 15 MG capsule Take 25 mg by mouth daily.    Yes [provider]  lisinopril (PRINIVIL,ZESTRIL) 40 MG tablet Take 40 mg by mouth daily.   Yes [provider]  meloxicam (MOBIC) 7.5 MG tablet Take 7.5 mg by mouth daily.   Yes [provider]  Multiple Vitamin (MULTI-VITAMINS) TABS Take 1 tablet by mouth daily.   Yes [provider]  simethicone (MYLICON) 80 MG chewable tablet Chew 80 mg by mouth every 6 (six) hours as needed for flatulence.  Yes [provider]  clopidogrel (PLAVIX) 75 MG tablet Take 1 tablet (75 mg total) by mouth daily with breakfast. 04/30/17   Henreitta Leber, MD  dexamethasone (DECADRON) 4 MG tablet Take 20 mg by mouth once a week. On wednesdays    [provider]    Allergies Antihistamines, chlorpheniramine-type and Nitroglycerin  Family History  Problem Relation Age of Onset  . Heart disease  Other     Social History Social History  Substance Use Topics  . Smoking status: Former Research scientist (life sciences)  . Smokeless tobacco: Former Systems developer  . Alcohol use No    Review of Systems Constitutional: No fever/chills Eyes: No visual changes. ENT: No sore throat. Cardiovascular:Positive for chest pain. Respiratory: Denies shortness of breath. Gastrointestinal: No abdominal pain.  No nausea, no vomiting.  No diarrhea.  No constipation. Genitourinary: Negative for dysuria. Musculoskeletal: Negative for neck pain.  Negative for back pain. Integumentary: Negative for rash. Neurological: Negative for headaches, focal weakness or numbness.   ____________________________________________   PHYSICAL EXAM:  VITAL SIGNS: ED Triage Vitals  Enc Vitals Group     BP 04/29/17 0126 (!) 160/80     Pulse Rate 04/29/17 0126 75     Resp 04/29/17 0126 20     Temp 04/29/17 0126 97.8 F (36.6 C)     Temp Source 04/29/17 0126 Oral     SpO2 04/29/17 0126 100 %     Weight 04/29/17 0118 77.1 kg (170 lb)     Height 04/29/17 0118 1.727 m (5\' 8" )     Head Circumference --      Peak Flow --      Pain Score 04/29/17 0117 7     Pain Loc --      Pain Edu? --      Excl. in [place]? --     Constitutional: Alert and oriented. Well appearing and in no acute distress. Eyes: Conjunctivae are normal.  Head: Atraumatic. Mouth/Throat: Mucous membranes are moist.  Oropharynx non-erythematous. Neck: No stridor.   Cardiovascular: Normal rate, regular rhythm. Good peripheral circulation. Grossly normal heart sounds. Respiratory: Normal respiratory effort.  No retractions. Lungs CTAB. Gastrointestinal: Soft and nontender. No distention.  Musculoskeletal: No lower extremity tenderness nor edema. No gross deformities of extremities. Neurologic:  Normal speech and language. No gross focal neurologic deficits are appreciated.  Skin:  Skin is warm, dry and intact. No rash noted. Psychiatric: Mood and affect are normal. Speech and  behavior are normal.  ____________________________________________   LABS (all labs ordered are listed, but only abnormal results are displayed)  Labs Reviewed  BASIC METABOLIC PANEL - Abnormal; Notable for the following:       Result Value   Glucose, Bld 242 (*)    BUN 37 (*)    All other components within normal limits  CBC - Abnormal; Notable for the following:    WBC 10.9 (*)    RBC 3.88 (*)    Hemoglobin 12.5 (*)    HCT 35.2 (*)    RDW 15.2 (*)    Platelets 123 (*)    All other components within normal limits  TROPONIN I - Abnormal; Notable for the following:    Troponin I 0.84 (*)    All other components within normal limits  HEMOGLOBIN A1C - Abnormal; Notable for the following:    Hgb A1c MFr Bld 6.1 (*)    All other components within normal limits  TROPONIN I - Abnormal; Notable for the following:    Troponin  I 16.77 (*)    All other components within normal limits  TROPONIN I - Abnormal; Notable for the following:    Troponin I 26.75 (*)    All other components within normal limits  TROPONIN I - Abnormal; Notable for the following:    Troponin I 27.53 (*)    All other components within normal limits  CBC - Abnormal; Notable for the following:    RBC 3.12 (*)    Hemoglobin 9.8 (*)    HCT 28.2 (*)    RDW 15.4 (*)    Platelets 86 (*)    All other components within normal limits  GLUCOSE, CAPILLARY - Abnormal; Notable for the following:    Glucose-Capillary 136 (*)    All other components within normal limits  TSH  APTT  PROTIME-INR  CREATININE, SERUM  GLUCOSE, CAPILLARY  POCT ACTIVATED CLOTTING TIME   ____________________________________________  EKG ED ECG REPORT I, [place] Zakayla Martinec, the attending physician, personally viewed and interpreted this ECG.   Date: 05/06/2017  EKG Time: 1:16 AM  Rate: 100  Rhythm: Atrial fibrillation  Axis: Normal  Intervals: Normal  ST&T Change: None  ____________________________________________  RADIOLOGY I,  [place] Tamina Cyphers, personally viewed and evaluated these images (plain radiographs) as part of my medical decision making, as well as reviewing the written report by the radiologist.  CLINICAL DATA:  Chest tightness  EXAM: CHEST  2 VIEW  COMPARISON:  01/27/2017  FINDINGS: Post sternotomy changes. There is hyperinflation. Slight increased coarse interstitial opacities in the perihilar regions and bilateral lung bases. No focal consolidation or pleural effusion. Stable cardiomediastinal silhouette with atherosclerosis. No pneumothorax.  IMPRESSION: Slight increased coarse interstitial perihilar and basilar opacity may reflect acute interstitial inflammation or edema on chronic change. Otherwise no significant interval change.   Electronically Signed   By: Donavan Foil M.D.   On: 04/29/2017 01:48 ____________________________________________   Procedures  Critical care:CRITICAL CARE Performed by: Gregor Hams   Total critical care time: 30 minutes  Critical care time was exclusive of separately billable procedures and treating other patients.  Critical care was necessary to treat or prevent imminent or life-threatening deterioration.  Critical care was time spent personally by me on the following activities: development of treatment plan with patient and/or surrogate as well as nursing, discussions with consultants, evaluation of patient's response to treatment, examination of patient, obtaining history from patient or surrogate, ordering and performing treatments and interventions, ordering and review of laboratory studies, ordering and review of radiographic studies, pulse oximetry and re-evaluation of patient's condition.   ____________________________________________   INITIAL IMPRESSION / ASSESSMENT AND PLAN / ED COURSE  Pertinent labs & imaging results that were available during my care of the patient were reviewed by me and considered in my medical decision  making (see chart for details).  83 year old male presenting to the emergency department with chest pain that is described as squeezing. History physical exam concerning for possible cardiac etiology. The suspicion was proven with a positive troponin of 0.84. Patient chest pain-free at this time however given these findings consistent with an STEMI patient discussed with Dr. [facility] admission for further evaluation and management.Appropriate anticoagulation will be started. Patient received aspirin in the emergency department with complete resolution of chest pain.      ____________________________________________  FINAL CLINICAL IMPRESSION(S) / ED DIAGNOSES  NSTEMI CHEST PAIN  MEDICATIONS GIVEN DURING THIS VISIT:  Medications  tirofiban (AGGRASTAT) infusion 50 mcg/mL 100 mL (0 mcg/kg/min  77.1 kg Intravenous Stopped  04/30/17 0814)  aspirin 81 MG chewable tablet (not administered)  aspirin chewable tablet 324 mg (324 mg Oral Given 04/29/17 0240)  heparin bolus via infusion 4,000 Units (4,000 Units Intravenous Bolus from Bag 04/29/17 0800)  tirofiban (AGGRASTAT) infusion 50 mcg/mL 100 mL (0.075 mcg/kg/min  77.1 kg Intravenous New Bag/Given 04/29/17 1409)     NEW OUTPATIENT MEDICATIONS STARTED DURING THIS VISIT:  Discharge Medication List as of 04/30/2017 11:05 AM      Discharge Medication List as of 04/30/2017 11:05 AM    CONTINUE these medications which have CHANGED   Details  clopidogrel (PLAVIX) 75 MG tablet Take 1 tablet (75 mg total) by mouth daily with breakfast., Starting Sat 04/30/2017, Print        Discharge Medication List as of 04/30/2017 11:05 AM    STOP taking these medications     ondansetron (ZOFRAN) 8 MG tablet Comments:  Reason for Stopping:           Note:  This document was prepared using Dragon voice recognition software and may include unintentional dictation errors.    Gregor Hams, MD 05/06/17 0600

## 2017-06-09 ENCOUNTER — Emergency Department: Payer: Medicare HMO

## 2017-06-09 ENCOUNTER — Inpatient Hospital Stay
Admission: EM | Admit: 2017-06-09 | Discharge: 2017-06-11 | DRG: 251 | Disposition: A | Discharge status: Home / Self Care | Payer: Medicare HMO | Attending: Internal Medicine | Admitting: Internal Medicine

## 2017-06-09 DIAGNOSIS — R000 Tachycardia, unspecified: Secondary | ICD-10-CM | POA: Diagnosis present

## 2017-06-09 DIAGNOSIS — I10 Essential (primary) hypertension: Secondary | ICD-10-CM | POA: Diagnosis present

## 2017-06-09 DIAGNOSIS — I252 Old myocardial infarction: Secondary | ICD-10-CM

## 2017-06-09 DIAGNOSIS — Z87891 Personal history of nicotine dependence: Secondary | ICD-10-CM

## 2017-06-09 DIAGNOSIS — I214 Non-ST elevation (NSTEMI) myocardial infarction: Secondary | ICD-10-CM | POA: Diagnosis not present

## 2017-06-09 DIAGNOSIS — Z7902 Long term (current) use of antithrombotics/antiplatelets: Secondary | ICD-10-CM

## 2017-06-09 DIAGNOSIS — Z7952 Long term (current) use of systemic steroids: Secondary | ICD-10-CM

## 2017-06-09 DIAGNOSIS — Z952 Presence of prosthetic heart valve: Secondary | ICD-10-CM

## 2017-06-09 DIAGNOSIS — Y831 Surgical operation with implant of artificial internal device as the cause of abnormal reaction of the patient, or of later complication, without mention of misadventure at the time of the procedure: Secondary | ICD-10-CM | POA: Diagnosis present

## 2017-06-09 DIAGNOSIS — E119 Type 2 diabetes mellitus without complications: Secondary | ICD-10-CM | POA: Diagnosis present

## 2017-06-09 DIAGNOSIS — Z7982 Long term (current) use of aspirin: Secondary | ICD-10-CM

## 2017-06-09 DIAGNOSIS — I200 Unstable angina: Secondary | ICD-10-CM

## 2017-06-09 DIAGNOSIS — Z79899 Other long term (current) drug therapy: Secondary | ICD-10-CM

## 2017-06-09 DIAGNOSIS — N400 Enlarged prostate without lower urinary tract symptoms: Secondary | ICD-10-CM | POA: Diagnosis present

## 2017-06-09 DIAGNOSIS — I4891 Unspecified atrial fibrillation: Secondary | ICD-10-CM

## 2017-06-09 DIAGNOSIS — I25110 Atherosclerotic heart disease of native coronary artery with unstable angina pectoris: Secondary | ICD-10-CM | POA: Diagnosis present

## 2017-06-09 DIAGNOSIS — Z791 Long term (current) use of non-steroidal anti-inflammatories (NSAID): Secondary | ICD-10-CM

## 2017-06-09 DIAGNOSIS — I447 Left bundle-branch block, unspecified: Secondary | ICD-10-CM | POA: Diagnosis present

## 2017-06-09 DIAGNOSIS — I454 Nonspecific intraventricular block: Secondary | ICD-10-CM | POA: Diagnosis present

## 2017-06-09 DIAGNOSIS — C9000 Multiple myeloma not having achieved remission: Secondary | ICD-10-CM | POA: Diagnosis present

## 2017-06-09 DIAGNOSIS — Z8546 Personal history of malignant neoplasm of prostate: Secondary | ICD-10-CM

## 2017-06-09 DIAGNOSIS — E782 Mixed hyperlipidemia: Secondary | ICD-10-CM | POA: Diagnosis present

## 2017-06-09 DIAGNOSIS — T82855A Stenosis of coronary artery stent, initial encounter: Secondary | ICD-10-CM | POA: Diagnosis present

## 2017-06-09 DIAGNOSIS — Z951 Presence of aortocoronary bypass graft: Secondary | ICD-10-CM

## 2017-06-09 DIAGNOSIS — E871 Hypo-osmolality and hyponatremia: Secondary | ICD-10-CM | POA: Diagnosis present

## 2017-06-09 HISTORY — DX: Type 2 diabetes mellitus without complications: E11.9

## 2017-06-09 LAB — CBC
HCT: 34.3 % — ABNORMAL LOW (ref 40.0–52.0)
HEMOGLOBIN: 11.9 g/dL — AB (ref 13.0–18.0)
MCH: 31.1 pg (ref 26.0–34.0)
MCHC: 34.8 g/dL (ref 32.0–36.0)
MCV: 89.4 fL (ref 80.0–100.0)
Platelets: 162 10*3/uL (ref 150–440)
RBC: 3.83 MIL/uL — ABNORMAL LOW (ref 4.40–5.90)
RDW: 16.4 % — AB (ref 11.5–14.5)
WBC: 7.7 10*3/uL (ref 3.8–10.6)

## 2017-06-09 LAB — GLUCOSE, CAPILLARY: GLUCOSE-CAPILLARY: 308 mg/dL — AB (ref 65–99)

## 2017-06-09 MED ORDER — AMIODARONE IV BOLUS ONLY 150 MG/100ML
INTRAVENOUS | Status: AC
  Administered 2017-06-09: 150 mg via INTRAVENOUS
  Filled 2017-06-09: qty 100

## 2017-06-09 MED ORDER — SODIUM CHLORIDE 0.9 % IV BOLUS (SEPSIS)
1000.0000 mL | Freq: Once | INTRAVENOUS | Status: AC
  Administered 2017-06-09: 1000 mL via INTRAVENOUS

## 2017-06-09 MED ORDER — AMIODARONE IV BOLUS ONLY 150 MG/100ML
150.0000 mg | Freq: Once | INTRAVENOUS | Status: AC
  Administered 2017-06-09: 150 mg via INTRAVENOUS

## 2017-06-09 NOTE — ED Notes (Signed)
ED Provider at bedside. 

## 2017-06-09 NOTE — ED Notes (Signed)
Zoll pads placed on pt.

## 2017-06-09 NOTE — ED Triage Notes (Signed)
Per EMS, pt started having CP across chest 2 hrs ago. Pt states hx of 9 MI's, CABG and has stents. Pt also currently getting chemo for bone marrow cancer, pt states last treatment was today. EMS reports pt hypertensive and tachycardic on arrival. Pt A&O at this time and able to answer questions.

## 2017-06-09 NOTE — ED Provider Notes (Signed)
[facility] Emergency Department Provider Note   ____________________________________________   First MD Initiated Contact with Patient 06/09/17 2329     (approximate)  I have reviewed the triage vital signs and the nursing notes.   HISTORY  Chief Complaint Chest Pain    HPI Dwayne Terry is a 83 y.o. male who comes into the hospital today with some chest pain. The patient does have a history of multiple myeloma. He reports that he was placed on steroids and he feels that the steroids giving him gas. The patient states that he thinks that the gas pain is going into his chest. The patient reports that he did have some low platelets. The chest pain started around 8:30. The patient was restoring a tractor reports that he had been out for most of the day. He states that he did not take anything heavy up. He's had stents placed as recently as June 1 and he also had a heart attack March 3. The patient reports that he does have some shortness of breath but denies any abdominal pain nausea vomiting or dizziness. He reports that the pain is all the way across the front of his chest. He reports that he did receive some aspirin by EMS and currently his pain is a 1-2 out of 10 in intensity. The patient is here today for evaluation of his symptoms.   Past Medical History:  Diagnosis Date  . Cancer ([place])    bone marrow, prostate cancer  . Diabetes mellitus without complication ([place])   . Hyperlipidemia   . MI (mitral incompetence)   . MI (myocardial infarction) [facility])     Patient Active Problem List   Diagnosis Date Noted  . Chest pain 04/29/2017  . NSTEMI (non-ST elevated myocardial infarction) ([place]) 04/29/2017  . Chest pain, rule out acute myocardial infarction 01/27/2017    Past Surgical History:  Procedure Laterality Date  . AORTIC VALVE REPLACEMENT (AVR)/CORONARY ARTERY BYPASS GRAFTING (CABG)    . CORONARY ANGIOPLASTY WITH STENT PLACEMENT    . CORONARY  STENT INTERVENTION N/A 01/28/2017   Procedure: Coronary Stent Intervention;  Surgeon: [name], MD;  Location: [place] CV LAB;  Service: Cardiovascular;  Laterality: N/A;  . CORONARY STENT INTERVENTION N/A 04/29/2017   Procedure: Coronary Stent Intervention;  Surgeon: [name], MD;  Location: [place] CV LAB;  Service: Cardiovascular;  Laterality: N/A;  . LEFT HEART CATH AND CORONARY ANGIOGRAPHY N/A 01/28/2017   Procedure: Left Heart Cath and Coronary Angiography;  Surgeon: [name], MD;  Location: [facility];  Service: Cardiovascular;  Laterality: N/A;  . LEFT HEART CATH AND CORS/GRAFTS ANGIOGRAPHY N/A 04/29/2017   Procedure: Left Heart Cath and Cors/Grafts Angiography;  Surgeon: [name], MD;  Location: [place] CV LAB;  Service: Cardiovascular;  Laterality: N/A;    Prior to Admission medications   Medication Sig Start Date End Date Taking? Authorizing Provider  acetaminophen (TYLENOL) 325 MG tablet Take 650 mg by mouth every 6 (six) hours as needed.   Yes [provider]  aspirin EC 81 MG tablet Take 1 tablet (81 mg total) by mouth daily. 01/29/17  Yes Loletha Grayer, MD  atorvastatin (LIPITOR) 80 MG tablet Take 80 mg by mouth daily at 6 PM.   Yes [provider]  carvedilol (COREG) 6.25 MG tablet Take 1 tablet (6.25 mg total) by mouth 2 (two) times daily with a meal. Patient taking differently: Take 3.25 mg by mouth 2 (two) times daily with a  meal.  01/29/17  Yes Wieting, Richard, MD  clopidogrel (PLAVIX) 75 MG tablet Take 1 tablet (75 mg total) by mouth daily with breakfast. 04/30/17  Yes Sainani, Belia Heman, MD  dexamethasone (DECADRON) 4 MG tablet Take 20 mg by mouth once a week. On wednesdays   Yes [provider]  finasteride (PROSCAR) 5 MG tablet Take 2.5 mg by mouth daily.    Yes [provider]  lenalidomide (REVLIMID) 15 MG capsule Take 15 mg by mouth daily.    Yes [provider]    meloxicam (MOBIC) 7.5 MG tablet Take 7.5 mg by mouth daily.   Yes [provider]  Multiple Vitamin (MULTI-VITAMINS) TABS Take 1 tablet by mouth daily.   Yes [provider]  simethicone (MYLICON) 80 MG chewable tablet Chew 80 mg by mouth every 6 (six) hours as needed for flatulence.   Yes [provider]    Allergies Antihistamines, chlorpheniramine-type and Nitroglycerin  Family History  Problem Relation Age of Onset  . Heart disease Other     Social History Social History  Substance Use Topics  . Smoking status: Former Research scientist (life sciences)  . Smokeless tobacco: Former Systems developer  . Alcohol use No    Review of Systems  Constitutional: No fever/chills Eyes: No visual changes. ENT: No sore throat. Cardiovascular:  chest pain. Respiratory:  shortness of breath. Gastrointestinal: No abdominal pain.  No nausea, no vomiting.  No diarrhea.  No constipation. Genitourinary: Negative for dysuria. Musculoskeletal: Negative for back pain. Skin: Negative for rash. Neurological: Negative for headaches, focal weakness or numbness.   ____________________________________________   PHYSICAL EXAM:  VITAL SIGNS: ED Triage Vitals  Enc Vitals Group     BP 06/09/17 2330 (!) 148/87     Pulse Rate 06/09/17 2330 (!) 138     Resp 06/09/17 2330 (!) 26     Temp 06/09/17 2330 97.9 F (36.6 C)     Temp Source 06/09/17 2330 Oral     SpO2 06/09/17 2330 98 %     Weight 06/09/17 2337 166 lb (75.3 kg)     Height 06/09/17 2337 _0  (1.727 m)     Head Circumference --      Peak Flow --      Pain Score 06/09/17 2329 1     Pain Loc --      Pain Edu? --      Excl. in [place]? --     Constitutional: Alert and oriented. Well appearing and in Mild distress. Eyes: Conjunctivae are normal. PERRL. EOMI. Head: Atraumatic. Nose: No congestion/rhinnorhea. Mouth/Throat: Mucous membranes are moist.  Oropharynx non-erythematous. Cardiovascular: Tachycardia, irregular rhythm. Grossly normal heart  sounds.  Good peripheral circulation. Respiratory: Increased respiratory effort.  No retractions. Crackles in bilateral bases with some wheezing throughout Gastrointestinal: Soft and nontender. No distention.  Musculoskeletal: No lower extremity tenderness nor edema.   Neurologic:  Normal speech and language.  Skin:  Skin is warm, dry and intact.Marland Kitchen Psychiatric: Mood and affect are normal.   ____________________________________________   LABS (all labs ordered are listed, but only abnormal results are displayed)  Labs Reviewed  BASIC METABOLIC PANEL - Abnormal; Notable for the following:       Result Value   Sodium 134 (*)    CO2 21 (*)    Glucose, Bld 265 (*)    BUN 24 (*)    Calcium 8.8 (*)    GFR calc non Af Amer 54 (*)    All other components within normal limits  CBC - Abnormal; Notable for the following:    RBC 3.83 (*)    Hemoglobin 11.9 (*)    HCT 34.3 (*)    RDW 16.4 (*)    All other components within normal limits  TROPONIN I - Abnormal; Notable for the following:    Troponin I 0.19 (*)    All other components within normal limits  BRAIN NATRIURETIC PEPTIDE - Abnormal; Notable for the following:    B Natriuretic Peptide 615.0 (*)    All other components within normal limits  GLUCOSE, CAPILLARY - Abnormal; Notable for the following:    Glucose-Capillary 308 (*)    All other components within normal limits  PROTIME-INR  APTT   ____________________________________________  EKG  ED ECG REPORT I, Loney Hering, the attending physician, personally viewed and interpreted this ECG.   Date: 06/09/2017  EKG Time: 2327  Rate: 146  Rhythm: atrial fibrillation, rate 146, LBBB  Axis: normal  Intervals:left bundle branch block  ST&T Change: LBBB (new)  ED ECG REPORT #2 I, Loney Hering, the attending physician, personally viewed and interpreted this ECG.   Date: 06/09/2017  EKG Time:2358  Rate: 97  Rhythm: atrial fibrillation, rate 97  Axis:  normal  Intervals:none  ST&T Change: ST depression in lead I, II, avL, ST elevation in aVR   ____________________________________________  RADIOLOGY  Dg Chest Portable 1 View  Result Date: 06/10/2017 CLINICAL DATA:  83 year old male with chest pain. EXAM: PORTABLE CHEST 1 VIEW COMPARISON:  Chest radiograph dated 04/29/2017 FINDINGS: Interval increase in the interstitial and perihilar densities with Kerley B-lines likely representing worsening of interstitial edema. Atypical pneumonia is not excluded. Clinical correlation is recommended. There is no focal consolidation, pleural effusion, or pneumothorax. Stable mild cardiomegaly. Median sternotomy wires and CABG vascular clips noted. Osteopenia with degenerative changes of the spine. No acute osseous pathology. IMPRESSION: Probable mild interstitial edema slightly worsened compared to prior study. Superimposed pneumonia is not excluded. Clinical correlation is recommended. Electronically Signed   By: Anner Crete M.D.   On: 06/10/2017 00:17    ____________________________________________   PROCEDURES  Procedure(s) performed: None  Procedures  Critical Care performed: Yes, see critical care note(s)  CRITICAL CARE Performed by: Charlesetta Ivory P   Total critical care time: 45 minutes  Critical care time was exclusive of separately billable procedures and treating other patients.  Critical care was necessary to treat or prevent imminent or life-threatening deterioration.  Critical care was time spent personally by me on the following activities: development of treatment plan with patient and/or surrogate as well as nursing, discussions with consultants, evaluation of patient's response to treatment, examination of patient, obtaining history from patient or surrogate, ordering and performing treatments and interventions, ordering and review of laboratory studies, ordering and review of radiographic studies, pulse oximetry and  re-evaluation of patient's condition.  ____________________________________________   INITIAL IMPRESSION / ASSESSMENT AND PLAN / ED COURSE  Pertinent labs & imaging results that were available during my care of the patient were reviewed by me and considered in my medical decision making (see chart for details).  This is an 83 year old male who comes into the hospital today with some chest pain. When the patient arrived in the emergency department he had wide complex tachycardia on his EKG. His heart rate was in the 140s. The patient's tachycardia had the appearance of ventricular tachycardia but it was irregular so there was also the concern of age of fibrillation with aberrancy. I went into  the patient's room and his blood pressure was in the 893T systolic. I decided to give the patient a bolus of amiodarone 150 mg IV. Looking back at the patient's previous EKGs he did not have a left bundle branch block so I did contact Dr. Fletcher Anon the cardiologist covering for STEMI. As the patient did have some chest pain in the left bundle branch block which is new and was concerned about a possible STEMI. Dr. Velva Harman did look at the EKG but stated that he felt it was due to the patient's tachycardia and this was more demand ischemia then a new heart attack. The patient's heart rate did slow down after the dose of amiodarone so I repeated the EKG and it appears that the left bundle branch block has improved. I did confirm that with Dr. Fletcher Anon and he recommended admitting the patient with heparin for unstable angina. I will also place the patient on amiodarone drip as his heart rate still does go up into the 110s the patient reports that he feels well and his chest pain is improved. He will be admitted to the hospitalist service for an STEMI and new onset A. Fib. The patient did have an elevated troponin but his troponin previously was in the 20s. The patient has no concern at this time.       ____________________________________________   FINAL CLINICAL IMPRESSION(S) / ED DIAGNOSES  Final diagnoses:  Atrial fibrillation with rapid ventricular response (HCC)  Unstable angina pectoris (HCC)  NSTEMI (non-ST elevated myocardial infarction) ([place])      NEW MEDICATIONS STARTED DURING THIS VISIT:  New Prescriptions   No medications on file     Note:  This document was prepared using Dragon voice recognition software and may include unintentional dictation errors.    Loney Hering, MD 06/10/17 [phone]

## 2017-06-10 ENCOUNTER — Encounter
Admission: EM | Disposition: A | Discharge status: Home / Self Care | Payer: Self-pay | Source: Home / Self Care | Attending: Internal Medicine

## 2017-06-10 DIAGNOSIS — I454 Nonspecific intraventricular block: Secondary | ICD-10-CM | POA: Diagnosis present

## 2017-06-10 DIAGNOSIS — I214 Non-ST elevation (NSTEMI) myocardial infarction: Secondary | ICD-10-CM | POA: Diagnosis present

## 2017-06-10 DIAGNOSIS — N400 Enlarged prostate without lower urinary tract symptoms: Secondary | ICD-10-CM | POA: Diagnosis present

## 2017-06-10 DIAGNOSIS — Z951 Presence of aortocoronary bypass graft: Secondary | ICD-10-CM | POA: Diagnosis not present

## 2017-06-10 DIAGNOSIS — E871 Hypo-osmolality and hyponatremia: Secondary | ICD-10-CM | POA: Diagnosis present

## 2017-06-10 DIAGNOSIS — I4891 Unspecified atrial fibrillation: Secondary | ICD-10-CM | POA: Diagnosis present

## 2017-06-10 DIAGNOSIS — Z7952 Long term (current) use of systemic steroids: Secondary | ICD-10-CM | POA: Diagnosis not present

## 2017-06-10 DIAGNOSIS — I10 Essential (primary) hypertension: Secondary | ICD-10-CM | POA: Diagnosis present

## 2017-06-10 DIAGNOSIS — I25110 Atherosclerotic heart disease of native coronary artery with unstable angina pectoris: Secondary | ICD-10-CM | POA: Diagnosis present

## 2017-06-10 DIAGNOSIS — E119 Type 2 diabetes mellitus without complications: Secondary | ICD-10-CM | POA: Diagnosis present

## 2017-06-10 DIAGNOSIS — I2510 Atherosclerotic heart disease of native coronary artery without angina pectoris: Secondary | ICD-10-CM | POA: Diagnosis not present

## 2017-06-10 DIAGNOSIS — Z8546 Personal history of malignant neoplasm of prostate: Secondary | ICD-10-CM | POA: Diagnosis not present

## 2017-06-10 DIAGNOSIS — R000 Tachycardia, unspecified: Secondary | ICD-10-CM | POA: Diagnosis present

## 2017-06-10 DIAGNOSIS — Z952 Presence of prosthetic heart valve: Secondary | ICD-10-CM | POA: Diagnosis not present

## 2017-06-10 DIAGNOSIS — Z791 Long term (current) use of non-steroidal anti-inflammatories (NSAID): Secondary | ICD-10-CM | POA: Diagnosis not present

## 2017-06-10 DIAGNOSIS — I252 Old myocardial infarction: Secondary | ICD-10-CM | POA: Diagnosis not present

## 2017-06-10 DIAGNOSIS — T82855A Stenosis of coronary artery stent, initial encounter: Secondary | ICD-10-CM | POA: Diagnosis present

## 2017-06-10 DIAGNOSIS — I447 Left bundle-branch block, unspecified: Secondary | ICD-10-CM | POA: Diagnosis present

## 2017-06-10 DIAGNOSIS — E782 Mixed hyperlipidemia: Secondary | ICD-10-CM | POA: Diagnosis present

## 2017-06-10 DIAGNOSIS — I200 Unstable angina: Secondary | ICD-10-CM | POA: Diagnosis present

## 2017-06-10 DIAGNOSIS — Z79899 Other long term (current) drug therapy: Secondary | ICD-10-CM | POA: Diagnosis not present

## 2017-06-10 DIAGNOSIS — C9000 Multiple myeloma not having achieved remission: Secondary | ICD-10-CM | POA: Diagnosis present

## 2017-06-10 DIAGNOSIS — Y831 Surgical operation with implant of artificial internal device as the cause of abnormal reaction of the patient, or of later complication, without mention of misadventure at the time of the procedure: Secondary | ICD-10-CM | POA: Diagnosis present

## 2017-06-10 DIAGNOSIS — Z7902 Long term (current) use of antithrombotics/antiplatelets: Secondary | ICD-10-CM | POA: Diagnosis not present

## 2017-06-10 DIAGNOSIS — Z87891 Personal history of nicotine dependence: Secondary | ICD-10-CM | POA: Diagnosis not present

## 2017-06-10 DIAGNOSIS — Z7982 Long term (current) use of aspirin: Secondary | ICD-10-CM | POA: Diagnosis not present

## 2017-06-10 HISTORY — PX: LEFT HEART CATH AND CORS/GRAFTS ANGIOGRAPHY: CATH118250

## 2017-06-10 HISTORY — PX: CORONARY BALLOON ANGIOPLASTY: CATH118233

## 2017-06-10 LAB — CBC
HCT: 28.7 % — ABNORMAL LOW (ref 40.0–52.0)
Hemoglobin: 9.9 g/dL — ABNORMAL LOW (ref 13.0–18.0)
MCH: 30.8 pg (ref 26.0–34.0)
MCHC: 34.5 g/dL (ref 32.0–36.0)
MCV: 89.3 fL (ref 80.0–100.0)
Platelets: 112 10*3/uL — ABNORMAL LOW (ref 150–440)
RBC: 3.21 MIL/uL — ABNORMAL LOW (ref 4.40–5.90)
RDW: 17.2 % — ABNORMAL HIGH (ref 11.5–14.5)
WBC: 5.8 10*3/uL (ref 3.8–10.6)

## 2017-06-10 LAB — TROPONIN I
TROPONIN I: 0.19 ng/mL — AB (ref <0.03)
Troponin I: 11.59 ng/mL (ref <0.03)
Troponin I: 12.27 ng/mL (ref <0.03)

## 2017-06-10 LAB — BASIC METABOLIC PANEL
Anion gap: 10 (ref 5–15)
BUN: 24 mg/dL — AB (ref 6–20)
CO2: 21 mmol/L — ABNORMAL LOW (ref 22–32)
CREATININE: 1.2 mg/dL (ref 0.61–1.24)
Calcium: 8.8 mg/dL — ABNORMAL LOW (ref 8.9–10.3)
Chloride: 103 mmol/L (ref 101–111)
GFR calc Af Amer: >60 mL/min (ref >60)
GFR, EST NON AFRICAN AMERICAN: 54 mL/min — AB (ref >60)
Glucose, Bld: 265 mg/dL — ABNORMAL HIGH (ref 65–99)
Potassium: 4 mmol/L (ref 3.5–5.1)
Sodium: 134 mmol/L — ABNORMAL LOW (ref 135–145)

## 2017-06-10 LAB — BRAIN NATRIURETIC PEPTIDE: B Natriuretic Peptide: 615 pg/mL — ABNORMAL HIGH (ref 0.0–100.0)

## 2017-06-10 LAB — PROTIME-INR
INR: 1.11
Prothrombin Time: 14.4 seconds (ref 11.4–15.2)

## 2017-06-10 LAB — GLUCOSE, CAPILLARY
Glucose-Capillary: 131 mg/dL — ABNORMAL HIGH (ref 65–99)
Glucose-Capillary: 81 mg/dL (ref 65–99)
Glucose-Capillary: 97 mg/dL (ref 65–99)
Glucose-Capillary: 139 mg/dL — ABNORMAL HIGH (ref 65–99)

## 2017-06-10 LAB — APTT: aPTT: 27 seconds (ref 24–36)

## 2017-06-10 LAB — POCT ACTIVATED CLOTTING TIME: Activated Clotting Time: 412 seconds

## 2017-06-10 LAB — TSH: TSH: 0.39 u[IU]/mL (ref 0.350–4.500)

## 2017-06-10 LAB — HEPARIN LEVEL (UNFRACTIONATED): Heparin Unfractionated: 0.35 IU/mL (ref 0.30–0.70)

## 2017-06-10 SURGERY — LEFT HEART CATH AND CORS/GRAFTS ANGIOGRAPHY
Anesthesia: Moderate Sedation

## 2017-06-10 MED ORDER — SODIUM CHLORIDE 0.9 % WEIGHT BASED INFUSION: 1.0000 mL/kg/h | INTRAVENOUS | Status: DC

## 2017-06-10 MED ORDER — SODIUM CHLORIDE 0.9 % IV SOLN: 250.0000 mL | INTRAVENOUS | Status: DC | PRN

## 2017-06-10 MED ORDER — MIDAZOLAM HCL 2 MG/2ML IJ SOLN
INTRAMUSCULAR | Status: DC | PRN
  Administered 2017-06-10: 1 mg via INTRAVENOUS

## 2017-06-10 MED ORDER — INSULIN ASPART 100 UNIT/ML ~~LOC~~ SOLN
0.0000 [IU] | Freq: Three times a day (TID) | SUBCUTANEOUS | Status: DC
  Administered 2017-06-10: 1 [IU] via SUBCUTANEOUS
  Administered 2017-06-11: 3 [IU] via SUBCUTANEOUS
  Filled 2017-06-10 (×2): qty 1

## 2017-06-10 MED ORDER — ATORVASTATIN CALCIUM 20 MG PO TABS
80.0000 mg | ORAL_TABLET | Freq: Every day | ORAL | Status: DC
  Administered 2017-06-10: 80 mg via ORAL
  Filled 2017-06-10: qty 4

## 2017-06-10 MED ORDER — ACETAMINOPHEN 650 MG RE SUPP: 650.0000 mg | Freq: Four times a day (QID) | RECTAL | Status: DC | PRN

## 2017-06-10 MED ORDER — ADULT MULTIVITAMIN W/MINERALS CH
1.0000 | ORAL_TABLET | Freq: Every day | ORAL | Status: DC
  Administered 2017-06-10 – 2017-06-11 (×2): 1 via ORAL
  Filled 2017-06-10 (×2): qty 1

## 2017-06-10 MED ORDER — CLOPIDOGREL BISULFATE 75 MG PO TABS
75.0000 mg | ORAL_TABLET | Freq: Every day | ORAL | Status: DC
  Administered 2017-06-10 – 2017-06-11 (×2): 75 mg via ORAL
  Filled 2017-06-10 (×2): qty 1

## 2017-06-10 MED ORDER — SODIUM CHLORIDE 0.9% FLUSH
3.0000 mL | INTRAVENOUS | Status: DC | PRN
  Administered 2017-06-11: 3 mL via INTRAVENOUS
  Filled 2017-06-10: qty 3

## 2017-06-10 MED ORDER — DEXAMETHASONE 4 MG PO TABS
20.0000 mg | ORAL_TABLET | ORAL | Status: DC
Start: 2017-06-10 — End: 2017-06-10
  Filled 2017-06-10: qty 5

## 2017-06-10 MED ORDER — SODIUM CHLORIDE 0.9% FLUSH
3.0000 mL | Freq: Two times a day (BID) | INTRAVENOUS | Status: DC
  Administered 2017-06-10: 3 mL via INTRAVENOUS

## 2017-06-10 MED ORDER — IOPAMIDOL (ISOVUE-300) INJECTION 61%
INTRAVENOUS | Status: DC | PRN
  Administered 2017-06-10: 110 mL via INTRA_ARTERIAL

## 2017-06-10 MED ORDER — ONDANSETRON HCL 4 MG/2ML IJ SOLN: 4.0000 mg | Freq: Four times a day (QID) | INTRAMUSCULAR | Status: DC | PRN

## 2017-06-10 MED ORDER — IOPAMIDOL (ISOVUE-300) INJECTION 61%
INTRAVENOUS | Status: DC | PRN
  Administered 2017-06-10: 30 mL via INTRA_ARTERIAL

## 2017-06-10 MED ORDER — HEPARIN (PORCINE) IN NACL 100-0.45 UNIT/ML-% IJ SOLN
900.0000 [IU]/h | INTRAMUSCULAR | Status: DC
  Administered 2017-06-10: 900 [IU]/h via INTRAVENOUS
  Filled 2017-06-10: qty 250

## 2017-06-10 MED ORDER — CARVEDILOL 6.25 MG PO TABS
6.2500 mg | ORAL_TABLET | Freq: Two times a day (BID) | ORAL | Status: DC
  Administered 2017-06-11: 6.25 mg via ORAL
  Filled 2017-06-10: qty 1

## 2017-06-10 MED ORDER — SODIUM CHLORIDE 0.9% FLUSH: 3.0000 mL | INTRAVENOUS | Status: DC | PRN

## 2017-06-10 MED ORDER — DEXTROSE 5 % IV SOLN: 60.0000 mg/h | Freq: Once | INTRAVENOUS | Status: DC

## 2017-06-10 MED ORDER — SIMETHICONE 80 MG PO CHEW
80.0000 mg | CHEWABLE_TABLET | Freq: Four times a day (QID) | ORAL | Status: DC | PRN
  Filled 2017-06-10: qty 1

## 2017-06-10 MED ORDER — LENALIDOMIDE 15 MG PO CAPS
15.0000 mg | ORAL_CAPSULE | Freq: Every day | ORAL | Status: DC
  Administered 2017-06-10 – 2017-06-11 (×2): 15 mg via ORAL
  Filled 2017-06-10 (×5): qty 1

## 2017-06-10 MED ORDER — SODIUM CHLORIDE 0.9 % IV SOLN
INTRAVENOUS | Status: AC | PRN
  Administered 2017-06-10: 1.75 mg/kg/h via INTRAVENOUS

## 2017-06-10 MED ORDER — BIVALIRUDIN BOLUS VIA INFUSION - CUPID
INTRAVENOUS | Status: DC | PRN
  Administered 2017-06-10: 56.925 mg via INTRAVENOUS

## 2017-06-10 MED ORDER — SODIUM CHLORIDE 0.9% FLUSH
3.0000 mL | Freq: Two times a day (BID) | INTRAVENOUS | Status: DC
  Administered 2017-06-11 (×2): 3 mL via INTRAVENOUS

## 2017-06-10 MED ORDER — DEXAMETHASONE 4 MG PO TABS: 20.0000 mg | ORAL_TABLET | ORAL | Status: DC

## 2017-06-10 MED ORDER — ASPIRIN EC 81 MG PO TBEC
81.0000 mg | DELAYED_RELEASE_TABLET | Freq: Every day | ORAL | Status: DC
  Administered 2017-06-10 – 2017-06-11 (×2): 81 mg via ORAL
  Filled 2017-06-10 (×2): qty 1

## 2017-06-10 MED ORDER — BIVALIRUDIN TRIFLUOROACETATE 250 MG IV SOLR
INTRAVENOUS | Status: AC
  Filled 2017-06-10: qty 250

## 2017-06-10 MED ORDER — FINASTERIDE 5 MG PO TABS
2.5000 mg | ORAL_TABLET | Freq: Every day | ORAL | Status: DC
  Administered 2017-06-10 – 2017-06-11 (×2): 2.5 mg via ORAL
  Filled 2017-06-10 (×2): qty 1

## 2017-06-10 MED ORDER — SODIUM CHLORIDE 0.9 % WEIGHT BASED INFUSION
1.0000 mL/kg/h | INTRAVENOUS | Status: AC
  Administered 2017-06-10: 1 mL/kg/h via INTRAVENOUS

## 2017-06-10 MED ORDER — FENTANYL CITRATE (PF) 100 MCG/2ML IJ SOLN
INTRAMUSCULAR | Status: AC
  Filled 2017-06-10: qty 2

## 2017-06-10 MED ORDER — AMIODARONE HCL IN DEXTROSE 360-4.14 MG/200ML-% IV SOLN
60.0000 mg/h | Freq: Once | INTRAVENOUS | Status: AC
  Administered 2017-06-10: 60 mg/h via INTRAVENOUS
  Filled 2017-06-10: qty 200

## 2017-06-10 MED ORDER — HEPARIN BOLUS VIA INFUSION
4000.0000 [IU] | Freq: Once | INTRAVENOUS | Status: AC
  Administered 2017-06-10: 4000 [IU] via INTRAVENOUS
  Filled 2017-06-10: qty 4000

## 2017-06-10 MED ORDER — FENTANYL CITRATE (PF) 100 MCG/2ML IJ SOLN
INTRAMUSCULAR | Status: DC | PRN
  Administered 2017-06-10: 25 ug via INTRAVENOUS

## 2017-06-10 MED ORDER — ONDANSETRON HCL 4 MG PO TABS: 4.0000 mg | ORAL_TABLET | Freq: Four times a day (QID) | ORAL | Status: DC | PRN

## 2017-06-10 MED ORDER — ACETAMINOPHEN 325 MG PO TABS: 650.0000 mg | ORAL_TABLET | Freq: Four times a day (QID) | ORAL | Status: DC | PRN

## 2017-06-10 MED ORDER — DOCUSATE SODIUM 100 MG PO CAPS
100.0000 mg | ORAL_CAPSULE | Freq: Two times a day (BID) | ORAL | Status: DC
  Administered 2017-06-10 – 2017-06-11 (×3): 100 mg via ORAL
  Filled 2017-06-10 (×3): qty 1

## 2017-06-10 MED ORDER — SODIUM CHLORIDE 0.9 % WEIGHT BASED INFUSION: 3.0000 mL/kg/h | INTRAVENOUS | Status: DC

## 2017-06-10 MED ORDER — MIDAZOLAM HCL 2 MG/2ML IJ SOLN
INTRAMUSCULAR | Status: AC
  Filled 2017-06-10: qty 2

## 2017-06-10 SURGICAL SUPPLY — 19 items
BALLN ANGIOSCULPT RX 3.0X10 (BALLOONS) ×3
BALLN ~~LOC~~ TREK RX 3.0X15 (BALLOONS) ×3
BALLOON ANGIOSCULPT RX 3.0X10 (BALLOONS) IMPLANT
BALLOON ~~LOC~~ TREK RX 3.0X15 (BALLOONS) IMPLANT
CATH 5FR JL4 DIAGNOSTIC (CATHETERS) ×2 IMPLANT
CATH INFINITI 5 FR IM (CATHETERS) ×2 IMPLANT
CATH INFINITI 5FR ANG PIGTAIL (CATHETERS) ×2 IMPLANT
CATH INFINITI JR4 5F (CATHETERS) ×2 IMPLANT
CATH VISTA GUIDE 6FR JR4 (CATHETERS) ×2 IMPLANT
DEVICE CLOSURE MYNXGRIP 6/7F (Vascular Products) ×2 IMPLANT
DEVICE INFLAT 30 PLUS (MISCELLANEOUS) ×2 IMPLANT
KIT MANI 3VAL PERCEP (MISCELLANEOUS) ×3 IMPLANT
NDL PERC 18GX7CM (NEEDLE) IMPLANT
NEEDLE PERC 18GX7CM (NEEDLE) ×3 IMPLANT
PACK CARDIAC CATH (CUSTOM PROCEDURE TRAY) ×3 IMPLANT
SHEATH AVANTI 6FR X 11CM (SHEATH) ×2 IMPLANT
SHEATH PINNACLE 5F 10CM (SHEATH) ×2 IMPLANT
WIRE EMERALD 3MM-J .035X150CM (WIRE) ×2 IMPLANT
WIRE INTUITION PROPEL ST 180CM (WIRE) ×2 IMPLANT

## 2017-06-10 NOTE — ED Notes (Signed)
Family at bedside. 

## 2017-06-10 NOTE — ED Notes (Signed)
Dr. Marcille Blanco in rm with pt.

## 2017-06-10 NOTE — Care Management Important Message (Signed)
Important Message  Patient Details  Name: Dwayne Terry MRN: [id number] Date of Birth: [birth year]   Medicare Important Message Given:  Yes Signed IM notice given   Katrina Stack, RN 06/10/2017, 11:47 AM

## 2017-06-10 NOTE — Care Management (Signed)
CM screen due to age.  Previous admit with nstemi 04/2017.  has again ruled in for nstemi. Heparin drip and scheduled for cardiac cath.  Independent in all adls, no issues accessing medical care, obtaining medications or with transportation.  Current with  PCP.  No discharge needs identified at present by care manager or members of care team at present time

## 2017-06-10 NOTE — Progress Notes (Signed)
ANTICOAGULATION CONSULT NOTE - Follow Up Consult  Pharmacy Consult for heparin drip Indication: chest pain/ACS  Allergies  Allergen Reactions  . Antihistamines, Chlorpheniramine-Type Other (See Comments)    Reaction: unknown  . Nitroglycerin Other (See Comments)    Reaction: hypotension    Patient Measurements: Height: 5\' 8"  (172.7 cm) Weight: 167 lb 4.8 oz (75.9 kg) IBW/kg (Calculated) : 68.4 Heparin Dosing Weight: 75  Vital Signs: Temp: 97.4 F (36.3 C) (07/13 0808) Temp Source: Oral (07/13 0808) BP: 102/61 (07/13 0808) Pulse Rate: 60 (07/13 0808)  Labs:  Recent Labs  06/09/17 2334 06/10/17 0558 06/10/17 0950  HGB 11.9*  --  9.9*  HCT 34.3*  --  28.7*  PLT 162  --  112*  APTT 27  --   --   LABPROT 14.4  --   --   INR 1.11  --   --   HEPARINUNFRC  --   --  0.35  CREATININE 1.20  --   --   TROPONINI 0.19* 11.59*  --     Estimated Creatinine Clearance: 44.3 mL/min (by C-G formula based on SCr of 1.2 mg/dL).   Medications:  Scheduled:  . aspirin EC  81 mg Oral Daily  . atorvastatin  80 mg Oral q1800  . carvedilol  6.25 mg Oral BID WC  . clopidogrel  75 mg Oral Q breakfast  . [START ON 06/15/2017] dexamethasone  20 mg Oral Weekly  . docusate sodium  100 mg Oral BID  . finasteride  2.5 mg Oral Daily  . insulin aspart  0-9 Units Subcutaneous TID WC  . lenalidomide  15 mg Oral Daily  . multivitamin with minerals  1 tablet Oral Daily  . sodium chloride flush  3 mL Intravenous Q12H   Infusions:  . sodium chloride    . [START ON 06/11/2017] sodium chloride     Followed by  . [START ON 06/11/2017] sodium chloride    . heparin 900 Units/hr (06/10/17 0127)   PRN: sodium chloride, acetaminophen **OR** acetaminophen, ondansetron **OR** ondansetron (ZOFRAN) IV, simethicone, sodium chloride flush   Goal of Therapy:  Heparin level 0.3-0.7 units/ml Monitor platelets by anticoagulation protocol: Yes   Plan:  Continue heparin 900 units/hr   Check anti-Xa level in  8 hours and daily while on heparin  Divon Krabill 06/10/2017,10:42 AM

## 2017-06-10 NOTE — ED Notes (Signed)
Per Dr. Marcille Blanco, continue with amiodarone infusion at this time.

## 2017-06-10 NOTE — Progress Notes (Signed)
Notified Dr. Marcille Blanco of troponin of  11.59. Already on heparin drip. Will continue to monitor.

## 2017-06-10 NOTE — Consult Note (Signed)
[facility] Cardiology Consultation Note  Patient ID: Dwayne Terry, MRN: [id number], DOB/AGE: [birth year] 83 y.o. Admit date: 06/09/2017   Date of Consult: 06/10/2017 Primary Physician: Kirk Ruths, MD Primary Cardiologist: Paraschos  Chief Complaint:  Chief Complaint  Patient presents with  . Chest Pain   Reason for Consult: myocardial infarction  HPI: 83 y.o. male with known coronary artery disease status post coronary artery bypass graft PCI and stent placement of multiple arteries myocardial infarction essential hypertension mixed hyperlipidemia and diabetes with complication who has had a recent myocardial infarction PCI and multiple stents of the native right coronary artery. The patient has done fairly well until recently he's had some gas and abdominal discomfort with and without physical activity and had a significant episode when walking from his barn to the house last night. This did not go away and he had an EKG showing bundle branch block unchanged from before. The patient does have an elevated troponin of 11.5 consistent with myocardial infarction and concerning for possible inferior myocardial infarction with previous stenting. The patient does have the aortic valve replacement for which appears to have been stable at this time. He was on appropriate medication management including Plavix and aspirin atorvastatin carvedilol and doing well. Currently he feels much better after heparinization and stabilization  Past Medical History:  Diagnosis Date  . Cancer ([place])    bone marrow, prostate cancer  . Diabetes mellitus without complication ([place])   . Hyperlipidemia   . MI (mitral incompetence)   . MI (myocardial infarction) [facility])       Surgical History:  Past Surgical History:  Procedure Laterality Date  . AORTIC VALVE REPLACEMENT (AVR)/CORONARY ARTERY BYPASS GRAFTING (CABG)    . CORONARY ANGIOPLASTY WITH STENT PLACEMENT    . CORONARY STENT INTERVENTION N/A  01/28/2017   Procedure: Coronary Stent Intervention;  Surgeon: [name], MD;  Location: [place] [facility];  Service: Cardiovascular;  Laterality: N/A;  . CORONARY STENT INTERVENTION N/A 04/29/2017   Procedure: Coronary Stent Intervention;  Surgeon: [name], MD;  Location: [place] [facility];  Service: Cardiovascular;  Laterality: N/A;  . LEFT HEART CATH AND CORONARY ANGIOGRAPHY N/A 01/28/2017   Procedure: Left Heart Cath and Coronary Angiography;  Surgeon: [name], MD;  Location: [place] [facility];  Service: Cardiovascular;  Laterality: N/A;  . LEFT HEART CATH AND CORS/GRAFTS ANGIOGRAPHY N/A 04/29/2017   Procedure: Left Heart Cath and Cors/Grafts Angiography;  Surgeon: [name], MD;  Location: [employer] [facility];  Service: Cardiovascular;  Laterality: N/A;     Home Meds: Prior to Admission medications   Medication Sig Start Date End Date Taking? Authorizing Provider  acetaminophen (TYLENOL) 325 MG tablet Take 650 mg by mouth every 6 (six) hours as needed.   Yes [provider]  aspirin EC 81 MG tablet Take 1 tablet (81 mg total) by mouth daily. 01/29/17  Yes Loletha Grayer, MD  atorvastatin (LIPITOR) 80 MG tablet Take 80 mg by mouth daily at 6 PM.   Yes [provider]  carvedilol (COREG) 6.25 MG tablet Take 1 tablet (6.25 mg total) by mouth 2 (two) times daily with a meal. Patient taking differently: Take 3.25 mg by mouth 2 (two) times daily with a meal.  01/29/17  Yes Leslye Peer, Richard, MD  clopidogrel (PLAVIX) 75 MG tablet Take 1 tablet (75 mg total) by mouth daily with breakfast. 04/30/17  Yes Sainani, Belia Heman, MD  dexamethasone (DECADRON) 4 MG tablet Take 20 mg by mouth  once a week. On wednesdays   Yes [provider]  finasteride (PROSCAR) 5 MG tablet Take 2.5 mg by mouth daily.    Yes [provider]  lenalidomide (REVLIMID) 15 MG capsule Take 15 mg by mouth daily.    Yes [provider]  meloxicam  (MOBIC) 7.5 MG tablet Take 7.5 mg by mouth daily.   Yes [provider]  Multiple Vitamin (MULTI-VITAMINS) TABS Take 1 tablet by mouth daily.   Yes [provider]  simethicone (MYLICON) 80 MG chewable tablet Chew 80 mg by mouth every 6 (six) hours as needed for flatulence.   Yes [provider]    Inpatient Medications:  . aspirin EC  81 mg Oral Daily  . atorvastatin  80 mg Oral q1800  . carvedilol  6.25 mg Oral BID WC  . clopidogrel  75 mg Oral Q breakfast  . [START ON 06/15/2017] dexamethasone  20 mg Oral Weekly  . docusate sodium  100 mg Oral BID  . finasteride  2.5 mg Oral Daily  . lenalidomide  15 mg Oral Daily  . multivitamin with minerals  1 tablet Oral Daily   . heparin 900 Units/hr (06/10/17 0127)    Allergies:  Allergies  Allergen Reactions  . Antihistamines, Chlorpheniramine-Type Other (See Comments)    Reaction: unknown  . Nitroglycerin Other (See Comments)    Reaction: hypotension    Social History   Social History  . Marital status: Married    Spouse name: N/A  . Number of children: N/A  . Years of education: N/A   Occupational History  . Not on file.   Social History Main Topics  . Smoking status: Former Research scientist (life sciences)  . Smokeless tobacco: Former Systems developer  . Alcohol use No  . Drug use: No  . Sexual activity: Not on file   Other Topics Concern  . Not on file   Social History Narrative  . No narrative on file     Family History  Problem Relation Age of Onset  . Heart disease Other      Review of Systems Positive for Chest pain shortness of breath Negative for: General:  chills, fever, night sweats or weight changes.  Cardiovascular: PND orthopnea syncope dizziness  Dermatological skin lesions rashes Respiratory: Cough congestion Urologic: Frequent urination urination at night and hematuria Abdominal: negative for nausea, vomiting, diarrhea, bright red blood per rectum, melena, or hematemesis Neurologic: negative for  visual changes, and/or hearing changes  All other systems reviewed and are otherwise negative except as noted above.  Labs:  Recent Labs  06/09/17 2334 06/10/17 0558  TROPONINI 0.19* 11.59*   Lab Results  Component Value Date   WBC 7.7 06/09/2017   HGB 11.9 (L) 06/09/2017   HCT 34.3 (L) 06/09/2017   MCV 89.4 06/09/2017   PLT 162 06/09/2017    Recent Labs Lab 06/09/17 2334  NA 134*  K 4.0  CL 103  CO2 21*  BUN 24*  CREATININE 1.20  CALCIUM 8.8*  GLUCOSE 265*   Lab Results  Component Value Date   CHOL 110 07/08/2013   HDL 30 (L) 07/08/2013   LDLCALC 60 07/08/2013   TRIG 101 07/08/2013   No results found for: DDIMER  Radiology/Studies:  Dg Chest Portable 1 View  Result Date: 06/10/2017 CLINICAL DATA:  83 year old male with chest pain. EXAM: PORTABLE CHEST 1 VIEW COMPARISON:  Chest radiograph dated 04/29/2017 FINDINGS: Interval increase in the interstitial and perihilar densities with Kerley B-lines likely representing worsening of interstitial  edema. Atypical pneumonia is not excluded. Clinical correlation is recommended. There is no focal consolidation, pleural effusion, or pneumothorax. Stable mild cardiomegaly. Median sternotomy wires and CABG vascular clips noted. Osteopenia with degenerative changes of the spine. No acute osseous pathology. IMPRESSION: Probable mild interstitial edema slightly worsened compared to prior study. Superimposed pneumonia is not excluded. Clinical correlation is recommended. Electronically Signed   By: Anner Crete M.D.   On: 06/10/2017 00:17    EKG: Atrial fibrillation with the controlled ventricular rate and left bundle branch block  Weights: Filed Weights   06/09/17 2337 06/10/17 0339  Weight: 75.3 kg (166 lb) 75.9 kg (167 lb 4.8 oz)     Physical Exam: Blood pressure 102/61, pulse 60, temperature (!) 97.4 F (36.3 C), temperature source Oral, resp. rate 19, height 5\' 8"  (1.727 m), weight 75.9 kg (167 lb 4.8 oz), SpO2 97 %.  Body mass index is 25.44 kg/m. General: Well developed, well nourished, in no acute distress. Head eyes ears nose throat: Normocephalic, atraumatic, sclera non-icteric, no xanthomas, nares are without discharge. No apparent thyromegaly and/or mass  Lungs: Normal respiratory effort.  no wheezes, no rales, no rhonchi.  Heart: Irregular with normal S1 S2. 2+ aortic murmur gallop, no rub, PMI is normal size and placement, carotid upstroke normal without bruit, jugular venous pressure is normal Abdomen: Soft, non-tender, non-distended with normoactive bowel sounds. No hepatomegaly. No rebound/guarding. No obvious abdominal masses. Abdominal aorta is normal size without bruit Extremities: No edema. no cyanosis, no clubbing, no ulcers  Peripheral : 2+ bilateral upper extremity pulses, 2+ bilateral femoral pulses, 2+ bilateral dorsal pedal pulse Neuro: Alert and oriented. No facial asymmetry. No focal deficit. Moves all extremities spontaneously. Musculoskeletal: Normal muscle tone without kyphosis Psych:  Responds to questions appropriately with a normal affect.    Assessment: 83 year old male with known coronary disease status post coronary bypass graft aortic valve replacement with multiple previous myocardial infarction PCI and stent placement having new onset chest discomfort and abnormal EKG with elevated troponin consistent with non-ST elevation myocardial infarction.  Plan: 1. Continue heparin for further risk reduction in myocardial infarction 2. Dual antiplatelet therapy for myocardial infarction previous PCI and stent placement 3. Beta blocker for heart rate control of atrial fibrillation and myocardial infarction 4. Echocardiogram to assess extent of myocardial infarction worsening LV systolic dysfunction 5. Proceed to cardiac catheterization to assess coronary anatomy and further treatment thereof is necessary. Patient understands risk and benefits of cardiac catheterization. This includes  possibility of death stroke heart attack infection bleeding or blood clot. He is at low risk for conscious sedation  Signed, Corey Skains M.D. [facility] 06/10/2017, 8:24 AM

## 2017-06-10 NOTE — Progress Notes (Signed)
ANTICOAGULATION CONSULT NOTE - Initial Consult  Pharmacy Consult for heparin drip Indication: chest pain/ACS  Allergies  Allergen Reactions  . Antihistamines, Chlorpheniramine-Type Other (See Comments)    Reaction: unknown  . Nitroglycerin Other (See Comments)    Reaction: hypotension    Patient Measurements: Height: 5\' 8"  (172.7 cm) Weight: 166 lb (75.3 kg) IBW/kg (Calculated) : 68.4 Heparin Dosing Weight: 75kg  Vital Signs: Temp: 97.9 F (36.6 C) (07/12 2330) Temp Source: Oral (07/12 2330) BP: 117/65 (07/13 0130) Pulse Rate: 73 (07/13 0130)  Labs:  Recent Labs  06/09/17 2334  HGB 11.9*  HCT 34.3*  PLT 162  APTT 27  LABPROT 14.4  INR 1.11  CREATININE 1.20  TROPONINI 0.19*    Estimated Creatinine Clearance: 44.3 mL/min (by C-G formula based on SCr of 1.2 mg/dL).   Medical History: Past Medical History:  Diagnosis Date  . Cancer ([place])    bone marrow, prostate cancer  . Diabetes mellitus without complication ([place])   . Hyperlipidemia   . MI (mitral incompetence)   . MI (myocardial infarction) ([place])     Medications:  No anticoagulation in PTA meds.  Assessment:  Goal of Therapy:  Heparin level 0.3-0.7 units/ml Monitor platelets by anticoagulation protocol: Yes   Plan:  4000 unit bolus and initial rate of 900 units/hr. First heparin level 8 hours after start of infusion.  Rourke Mcquitty S 06/10/2017,2:12 AM

## 2017-06-10 NOTE — Progress Notes (Signed)
Notified Dr. Marcille Blanco patient's heart rate sinus brady in the 50's at this time. Ordered to d/c amio drip. Will continue to monitor.

## 2017-06-10 NOTE — H&P (Signed)
Dwayne Terry is an 83 y.o. male.   Chief Complaint: Chest pain HPI: The patient with past medical history of diabetes, multiple myeloma and coronary artery disease status post MI with multiple stent placement presents to the emergency department complaining of chest pain. The patient admits to feeling his heart racing and an uneasiness in his chest that he thought may go away with some light exercise (walking). The patient went ran some errands but did not feel any better upon returning and lay down on the couch when he began to feel pressure across his chest. He denies shortness of breath, nausea, vomiting, diaphoresis or lightheadedness. Upon arrival to the emergency department the patient was found to have a heart rate greater than 190. Initial EKG was concerning for possible ST elevation, but after his rate was slowed some with amiodarone telemetry showed atrial fibrillation. Amiodarone eventually controlled his heart rate. Laboratory evaluation revealed elevated troponin which prompted emergency department staff to start him on heparin prior to calling the hospitalist service for admission.  Past Medical History:  Diagnosis Date  . Cancer ([place])    bone marrow, prostate cancer  . Diabetes mellitus without complication ([place])   . Hyperlipidemia   . MI (mitral incompetence)   . MI (myocardial infarction) [facility])     Past Surgical History:  Procedure Laterality Date  . AORTIC VALVE REPLACEMENT (AVR)/CORONARY ARTERY BYPASS GRAFTING (CABG)    . CORONARY ANGIOPLASTY WITH STENT PLACEMENT    . CORONARY STENT INTERVENTION N/A 01/28/2017   Procedure: Coronary Stent Intervention;  Surgeon: [name], MD;  Location: [place] [facility];  Service: Cardiovascular;  Laterality: N/A;  . CORONARY STENT INTERVENTION N/A 04/29/2017   Procedure: Coronary Stent Intervention;  Surgeon: [name], MD;  Location: [place] [facility];  Service: Cardiovascular;  Laterality: N/A;  . LEFT HEART CATH  AND CORONARY ANGIOGRAPHY N/A 01/28/2017   Procedure: Left Heart Cath and Coronary Angiography;  Surgeon: [name], MD;  Location: [place] [facility];  Service: Cardiovascular;  Laterality: N/A;  . LEFT HEART CATH AND CORS/GRAFTS ANGIOGRAPHY N/A 04/29/2017   Procedure: Left Heart Cath and Cors/Grafts Angiography;  Surgeon: [name], MD;  Location: [place] [facility];  Service: Cardiovascular;  Laterality: N/A;    Family History  Problem Relation Age of Onset  . Heart disease Other    Social History:  reports that he has quit smoking. He has quit using smokeless tobacco. He reports that he does not drink alcohol or use drugs.  Allergies:  Allergies  Allergen Reactions  . Antihistamines, Chlorpheniramine-Type Other (See Comments)    Reaction: unknown  . Nitroglycerin Other (See Comments)    Reaction: hypotension    Medications Prior to Admission  Medication Sig Dispense Refill  . acetaminophen (TYLENOL) 325 MG tablet Take 650 mg by mouth every 6 (six) hours as needed.    Marland Kitchen aspirin EC 81 MG tablet Take 1 tablet (81 mg total) by mouth daily.    Marland Kitchen atorvastatin (LIPITOR) 80 MG tablet Take 80 mg by mouth daily at 6 PM.    . carvedilol (COREG) 6.25 MG tablet Take 1 tablet (6.25 mg total) by mouth 2 (two) times daily with a meal. (Patient taking differently: Take 3.25 mg by mouth 2 (two) times daily with a meal. ) 60 tablet 0  . clopidogrel (PLAVIX) 75 MG tablet Take 1 tablet (75 mg total) by mouth daily with breakfast. 30 tablet 1  . dexamethasone (DECADRON) 4 MG tablet Take 20 mg by  mouth once a week. On wednesdays    . finasteride (PROSCAR) 5 MG tablet Take 2.5 mg by mouth daily.     Marland Kitchen lenalidomide (REVLIMID) 15 MG capsule Take 15 mg by mouth daily.     . meloxicam (MOBIC) 7.5 MG tablet Take 7.5 mg by mouth daily.    . Multiple Vitamin (MULTI-VITAMINS) TABS Take 1 tablet by mouth daily.    . simethicone (MYLICON) 80 MG chewable tablet Chew 80 mg by mouth every 6 (six)  hours as needed for flatulence.      Results for orders placed or performed during the hospital encounter of 06/09/17 (from the past 48 hour(s))  Basic metabolic panel     Status: Abnormal   Collection Time: 06/09/17 11:34 PM  Result Value Ref Range   Sodium 134 (L) 135 - 145 mmol/L   Potassium 4.0 3.5 - 5.1 mmol/L   Chloride 103 101 - 111 mmol/L   CO2 21 (L) 22 - 32 mmol/L   Glucose, Bld 265 (H) 65 - 99 mg/dL   BUN 24 (H) 6 - 20 mg/dL   Creatinine, Ser 1.20 0.61 - 1.24 mg/dL   Calcium 8.8 (L) 8.9 - 10.3 mg/dL   GFR calc non Af Amer 54 (L) >60 mL/min   GFR calc Af Amer >60 >60 mL/min    Comment: (NOTE) The eGFR has been calculated using the CKD EPI equation. This calculation has not been validated in all clinical situations. eGFR's persistently <60 mL/min signify possible Chronic Kidney Disease.    Anion gap 10 5 - 15  CBC     Status: Abnormal   Collection Time: 06/09/17 11:34 PM  Result Value Ref Range   WBC 7.7 3.8 - 10.6 K/uL   RBC 3.83 (L) 4.40 - 5.90 MIL/uL   Hemoglobin 11.9 (L) 13.0 - 18.0 g/dL   HCT 34.3 (L) 40.0 - 52.0 %   MCV 89.4 80.0 - 100.0 fL   MCH 31.1 26.0 - 34.0 pg   MCHC 34.8 32.0 - 36.0 g/dL   RDW 16.4 (H) 11.5 - 14.5 %   Platelets 162 150 - 440 K/uL  Troponin I     Status: Abnormal   Collection Time: 06/09/17 11:34 PM  Result Value Ref Range   Troponin I 0.19 (HH) <0.03 ng/mL    Comment: CRITICAL RESULT CALLED TO, READ BACK BY AND VERIFIED WITH KASEY ROBERTS @ 0009 ON 06/10/2017 BY [facility]   Brain natriuretic peptide     Status: Abnormal   Collection Time: 06/09/17 11:34 PM  Result Value Ref Range   B Natriuretic Peptide 615.0 (H) 0.0 - 100.0 pg/mL  Protime-INR     Status: None   Collection Time: 06/09/17 11:34 PM  Result Value Ref Range   Prothrombin Time 14.4 11.4 - 15.2 seconds   INR 1.11   APTT     Status: None   Collection Time: 06/09/17 11:34 PM  Result Value Ref Range   aPTT 27 24 - 36 seconds  Glucose, capillary     Status: Abnormal    Collection Time: 06/09/17 11:44 PM  Result Value Ref Range   Glucose-Capillary 308 (H) 65 - 99 mg/dL   Dg Chest Portable 1 View  Result Date: 06/10/2017 CLINICAL DATA:  83 year old male with chest pain. EXAM: PORTABLE CHEST 1 VIEW COMPARISON:  Chest radiograph dated 04/29/2017 FINDINGS: Interval increase in the interstitial and perihilar densities with Kerley B-lines likely representing worsening of interstitial edema. Atypical pneumonia is not excluded. Clinical correlation is recommended.  There is no focal consolidation, pleural effusion, or pneumothorax. Stable mild cardiomegaly. Median sternotomy wires and CABG vascular clips noted. Osteopenia with degenerative changes of the spine. No acute osseous pathology. IMPRESSION: Probable mild interstitial edema slightly worsened compared to prior study. Superimposed pneumonia is not excluded. Clinical correlation is recommended. Electronically Signed   By: Anner Crete M.D.   On: 06/10/2017 00:17    Review of Systems  Constitutional: Negative for chills and fever.  HENT: Negative for sore throat and tinnitus.   Eyes: Negative for blurred vision and redness.  Respiratory: Negative for cough and shortness of breath.   Cardiovascular: Positive for chest pain (pressure) and palpitations. Negative for orthopnea and PND.  Gastrointestinal: Negative for abdominal pain, diarrhea, nausea and vomiting.  Genitourinary: Negative for dysuria, frequency and urgency.  Musculoskeletal: Negative for joint pain and myalgias.  Skin: Negative for rash.       No lesions  Neurological: Negative for speech change, focal weakness and weakness.  Endo/Heme/Allergies: Does not bruise/bleed easily.       No temperature intolerance  Psychiatric/Behavioral: Negative for depression and suicidal ideas.    Blood pressure 113/62, pulse 66, temperature 98.3 F (36.8 C), resp. rate 18, height '5\' 8"'  (1.727 m), weight 75.9 kg (167 lb 4.8 oz), SpO2 99 %. Physical Exam   Constitutional: He is oriented to person, place, and time. He appears well-developed and well-nourished. No distress.  HENT:  Head: Normocephalic and atraumatic.  Mouth/Throat: Oropharynx is clear and moist.  Eyes: Pupils are equal, round, and reactive to light. Conjunctivae and EOM are normal. No scleral icterus.  Neck: Normal range of motion. Neck supple. No JVD present. No tracheal deviation present. No thyromegaly present.  Cardiovascular: Normal rate and regular rhythm.  Exam reveals distant heart sounds. Exam reveals no gallop and no friction rub.   No murmur heard. Respiratory: Effort normal and breath sounds normal. No respiratory distress. He has no wheezes.  GI: Soft. Bowel sounds are normal. He exhibits no distension. There is no tenderness.  Genitourinary:  Genitourinary Comments: Deferred  Musculoskeletal: Normal range of motion. He exhibits no edema.  Lymphadenopathy:    He has no cervical adenopathy.  Neurological: He is alert and oriented to person, place, and time. No cranial nerve deficit.  Skin: Skin is warm and dry. No rash noted. No erythema.  Psychiatric: He has a normal mood and affect. His behavior is normal. Judgment and thought content normal.     Assessment/Plan This is an 83 year old male admitted for NSTEMI. 1. NSTEMI: Elevated troponin likely secondary to demand ischemia from extreme tachycardia. The patient had a stent placed last month. Unlikely worsening of plaque burden since that time. Nonetheless follow cardiac biomarkers. Continue heparin drip. Consult cardiology. 2. Atrial fibrillation: With rapid ventricular rate; resolved. Amiodarone drip discontinued due to bradycardia (55 BPM). 3. Coronary artery disease: Continue aspirin and Plavix as well as therapeutic anticoagulation. 4. Hypertension: Controlled; continue carvedilol 5. Multiple myeloma: Continue Revlimid as well as dexamethasone 6. BPH: Continue finasteride 7. Hyperlipidemia: Continue statin  therapy 8. DVT prophylaxis: Heparin 9. GI prophylaxis: None The patient is a full code. Time spent on admission orders and patient care approximately 45 minutes  Harrie Foreman, MD 06/10/2017, 5:01 AM

## 2017-06-10 NOTE — ED Notes (Signed)
Pt states he gets chemo once a month for 18 mths due to bone marrow cancer. Pt states he takes "5 steroids" every Wednesday.

## 2017-06-10 NOTE — Progress Notes (Signed)
[facility] [place] NAME: Dwayne Terry    MR#:  [id number]  DATE OF BIRTH:  Sep 24, 1930  SUBJECTIVE:  CHIEF COMPLAINT:   Chief Complaint  Patient presents with  . Chest Pain  Hx of CAD and stent, came with pressure like central chest pain. Troponin kept rising gradually - peak at 12.27, going for cardiac cath this afternoon REVIEW OF SYSTEMS:  Review of Systems  Constitutional: Negative for chills, fever and weight loss.  HENT: Negative for nosebleeds and sore throat.   Eyes: Negative for blurred vision.  Respiratory: Negative for cough, shortness of breath and wheezing.   Cardiovascular: Positive for chest pain. Negative for orthopnea, leg swelling and PND.  Gastrointestinal: Negative for abdominal pain, constipation, diarrhea, heartburn, nausea and vomiting.  Genitourinary: Negative for dysuria and urgency.  Musculoskeletal: Negative for back pain.  Skin: Negative for rash.  Neurological: Negative for dizziness, speech change, focal weakness and headaches.  Endo/Heme/Allergies: Does not bruise/bleed easily.  Psychiatric/Behavioral: Negative for depression.    DRUG ALLERGIES:   Allergies  Allergen Reactions  . Antihistamines, Chlorpheniramine-Type Other (See Comments)    Reaction: unknown  . Nitroglycerin Other (See Comments)    Reaction: hypotension    VITALS:  Blood pressure (!) 107/56, pulse (!) 54, temperature (!) 97.4 F (36.3 C), temperature source Oral, resp. rate 19, height '5\' 8"'$  (1.727 m), weight 75.9 kg (167 lb 4.8 oz), SpO2 100 %.  PHYSICAL EXAMINATION:  GENERAL:  83 y.o.-year-old patient lying in the bed with no acute distress.  EYES: Pupils equal, round, reactive to light and accommodation. No scleral icterus. Extraocular muscles intact.  HEENT: Head atraumatic, normocephalic. Oropharynx and nasopharynx clear.  NECK:  Supple, no jugular venous distention. No thyroid enlargement, no tenderness.  LUNGS: Normal  breath sounds bilaterally, no wheezing, rales,rhonchi or crepitation. No use of accessory muscles of respiration.  CARDIOVASCULAR: S1, S2 normal. No murmurs, rubs, or gallops.  ABDOMEN: Soft, nontender, nondistended. Bowel sounds present. No organomegaly or mass.  EXTREMITIES: No pedal edema, cyanosis, or clubbing. Distal pulses palpable, right groin dressing clean. NEUROLOGIC: Cranial nerves II through XII are intact. Muscle strength 5/5 in all extremities. Sensation intact. Gait not checked.  PSYCHIATRIC: The patient is alert and oriented x 3.  SKIN: No obvious rash, lesion, or ulcer.   Physical Exam LABORATORY PANEL:   CBC  Recent Labs Lab 06/10/17 0950  WBC 5.8  HGB 9.9*  HCT 28.7*  PLT 112*   ------------------------------------------------------------------------------------------------------------------  Chemistries   Recent Labs Lab 06/09/17 2334  NA 134*  K 4.0  CL 103  CO2 21*  GLUCOSE 265*  BUN 24*  CREATININE 1.20  CALCIUM 8.8*   ------------------------------------------------------------------------------------------------------------------  Cardiac Enzymes  Recent Labs Lab 06/10/17 0558 06/10/17 1120  TROPONINI 11.59* 12.27*   ------------------------------------------------------------------------------------------------------------------  RADIOLOGY:  Dg Chest Portable 1 View  Result Date: 06/10/2017 CLINICAL DATA:  83 year old male with chest pain. EXAM: PORTABLE CHEST 1 VIEW COMPARISON:  Chest radiograph dated 04/29/2017 FINDINGS: Interval increase in the interstitial and perihilar densities with Kerley B-lines likely representing worsening of interstitial edema. Atypical pneumonia is not excluded. Clinical correlation is recommended. There is no focal consolidation, pleural effusion, or pneumothorax. Stable mild cardiomegaly. Median sternotomy wires and CABG vascular clips noted. Osteopenia with degenerative changes of the spine. No acute osseous  pathology. IMPRESSION: Probable mild interstitial edema slightly worsened compared to prior study. Superimposed pneumonia is not excluded. Clinical correlation is recommended. Electronically Signed   By: Anner Crete  M.D.   On: 06/10/2017 00:17   ASSESSMENT AND PLAN:   Active Problems:   NSTEMI (non-ST elevated myocardial infarction) [facility]) This is an 83 year old male admitted for NSTEMI.  * NSTEMI: The patient had a stent placed last month.  - Continue heparin drip. Consult cardiology. -Discussed with Dr. Nehemiah Massed who recommends cardiac catheterization which is scheduled for this afternoon. - continue ASA, Plavix, Statin, B-Blocker, BP running low to start ACE inhibitor.  * Atrial fibrillation: With rapid ventricular rate; resolved. Amiodarone drip discontinued due to bradycardia (55 BPM). -Rate much better controlled on coreg  * Coronary artery disease: Continue aspirin and Plavix as well as therapeutic anticoagulation.  * Hypertension: Controlled; continue carvedilol  * Multiple myeloma: Continue Revlimid as well as dexamethasone  * History prostate cancer/BPH Continue Proscar  * Hyperlipidemia: Continue statin therapy  * H/o Diabetes - Accuchecks achs with RISS coverage - Heart healthy, carb controlled diet.   * Hyponatremia, mild - Monitor BMP in a.m.      All the records are reviewed and case discussed with Care Management/Social Workerr. Management plans discussed with the patient, nursing and they are in agreement.  CODE STATUS: Full.  TOTAL TIME TAKING CARE OF THIS PATIENT: 35 minutes.     POSSIBLE D/C IN 1-2 DAYS, DEPENDING ON CLINICAL CONDITION.  Cardiology evaluation   Max Sane M.D on 06/10/2017   Between 7am to 6pm - Pager - [phone]  After 6pm go to [email or web address] - password [email or web address] [place]  Office  [phone]  CC: Primary care physician; Kirk Ruths, MD  Note: This dictation was prepared with  Dragon dictation along with smaller phrase technology. Any transcriptional errors that result from this process are unintentional.

## 2017-06-10 NOTE — Progress Notes (Signed)
[facility] Cardiology [facility] Encounter Note  Patient: Dwayne Terry / Admit Date: 06/09/2017 / Date of Encounter: 06/10/2017, 3:46 PM   Subjective: No more chest pain. Patient improved with medication management Cardiac catheterization showing inferior hypokinesis with ejection fraction of 40% Coronary arteries shows occluded LAD and left circumflex. Patent LIMA to the LAD saphenous vein graft to obtuse marginal Restenosis of proximal right coronary artery and in-stent restenosis with inferior infarct  Review of Systems: Positive for: Shortness of breath Negative for: Vision change, hearing change, syncope, dizziness, nausea, vomiting,diarrhea, bloody stool, stomach pain, cough, congestion, diaphoresis, urinary frequency, urinary pain,skin lesions, skin rashes Others previously listed  Objective: Telemetry: Normal sinus rhythm Physical Exam: Blood pressure (!) 105/51, pulse (!) 52, temperature (!) 97.4 F (36.3 C), temperature source Oral, resp. rate 18, height 5\' 8"  (1.727 m), weight 75.9 kg (167 lb 4.8 oz), SpO2 96 %. Body mass index is 25.44 kg/m. General: Well developed, well nourished, in no acute distress. Head: Normocephalic, atraumatic, sclera non-icteric, no xanthomas, nares are without discharge. Neck: No apparent masses Lungs: Normal respirations with no wheezes, no rhonchi, no rales , no crackles   Heart: Regular rate and rhythm, normal S1 S2, no murmur, no rub, no gallop, PMI is normal size and placement, carotid upstroke normal without bruit, jugular venous pressure normal Abdomen: Soft, non-tender, non-distended with normoactive bowel sounds. No hepatosplenomegaly. Abdominal aorta is normal size without bruit Extremities: No edema, no clubbing, no cyanosis, no ulcers,  Peripheral: 2+ radial, 2+ femoral, 2+ dorsal pedal pulses Neuro: Alert and oriented. Moves all extremities spontaneously. Psych:  Responds to questions appropriately with a normal  affect.   Intake/Output Summary (Last 24 hours) at 06/10/17 1546 Last data filed at 06/10/17 1010  Gross per 24 hour  Intake             1000 ml  Output             1750 ml  Net             -750 ml    Inpatient Medications:  . [MAR Hold] aspirin EC  81 mg Oral Daily  . [MAR Hold] atorvastatin  80 mg Oral q1800  . [MAR Hold] carvedilol  6.25 mg Oral BID WC  . [MAR Hold] clopidogrel  75 mg Oral Q breakfast  . [MAR Hold] dexamethasone  20 mg Oral Weekly  . [MAR Hold] docusate sodium  100 mg Oral BID  . [MAR Hold] finasteride  2.5 mg Oral Daily  . [MAR Hold] insulin aspart  0-9 Units Subcutaneous TID WC  . [MAR Hold] lenalidomide  15 mg Oral Daily  . [MAR Hold] multivitamin with minerals  1 tablet Oral Daily  . sodium chloride flush  3 mL Intravenous Q12H  . [START ON 06/11/2017] sodium chloride flush  3 mL Intravenous Q12H   Infusions:  . sodium chloride    . [START ON 06/11/2017] sodium chloride    . [START ON 06/11/2017] sodium chloride     Followed by  . [START ON 06/11/2017] sodium chloride    . sodium chloride      Labs:  Recent Labs  06/09/17 2334  NA 134*  K 4.0  CL 103  CO2 21*  GLUCOSE 265*  BUN 24*  CREATININE 1.20  CALCIUM 8.8*   No results for input(s): AST, ALT, ALKPHOS, BILITOT, PROT, ALBUMIN in the last 72 hours.  Recent Labs  06/09/17 2334 06/10/17 0950  WBC 7.7 5.8  HGB 11.9* 9.9*  HCT 34.3* 28.7*  MCV 89.4 89.3  PLT 162 112*    Recent Labs  06/09/17 2334 06/10/17 0558 06/10/17 1120  TROPONINI 0.19* 11.59* 12.27*   Invalid input(s): POCBNP No results for input(s): HGBA1C in the last 72 hours.   Weights: Filed Weights   06/09/17 2337 06/10/17 0339  Weight: 75.3 kg (166 lb) 75.9 kg (167 lb 4.8 oz)     Radiology/Studies:  Dg Chest Portable 1 View  Result Date: 06/10/2017 CLINICAL DATA:  83 year old male with chest pain. EXAM: PORTABLE CHEST 1 VIEW COMPARISON:  Chest radiograph dated 04/29/2017 FINDINGS: Interval increase in the  interstitial and perihilar densities with Kerley B-lines likely representing worsening of interstitial edema. Atypical pneumonia is not excluded. Clinical correlation is recommended. There is no focal consolidation, pleural effusion, or pneumothorax. Stable mild cardiomegaly. Median sternotomy wires and CABG vascular clips noted. Osteopenia with degenerative changes of the spine. No acute osseous pathology. IMPRESSION: Probable mild interstitial edema slightly worsened compared to prior study. Superimposed pneumonia is not excluded. Clinical correlation is recommended. Electronically Signed   By: Anner Crete M.D.   On: 06/10/2017 00:17     Assessment and Recommendation  83 y.o. male 83 year old male with diabetes with complication previous coronary artery bypass graft with the new onset of acute non-ST elevation myocardial infarction and inferior hypokinesis with in-stent restenosis of proximal right coronary artery now status post PCI with balloon only of proximal right coronary artery without complication 1. Continue medication management for acute myocardial infarction including Plavix and aspirin 2. Hypertension control with ACE inhibitor beta blocker 3. Rehabilitation has been discussed and ordered 4. High intensity cholesterol therapy with atorvastatin 5. Begin ambulation and follow for improvements and possible discharge to home tomorrow  Signed, Serafina Royals M.D. FACC

## 2017-06-10 NOTE — Progress Notes (Signed)
Inpatient Diabetes Program Recommendations  AACE/ADA: New Consensus Statement on Inpatient Glycemic Control (2015)  Target Ranges:  Prepandial:   less than 140 mg/dL      Peak postprandial:   less than 180 mg/dL (1-2 hours)      Critically ill patients:  140 - 180 mg/dL   Lab Results  Component Value Date   GLUCAP 308 (H) 06/09/2017   HGBA1C 6.1 (H) 04/29/2017    Review of Glycemic Control  Results for KAYNAN, KLONOWSKI (MRN [id number]) as of 06/10/2017 09:21  Ref. Range 06/09/2017 23:44  Glucose-Capillary Latest Ref Range: 65 - 99 mg/dL 308 (H)    Diabetes history: Type 2 (from cardiology note) Outpatient Diabetes medications: none noted Current orders for Inpatient glycemic control:   Inpatient Diabetes Program Recommendations: Steroid induced hyperglycemia.  Please consider CBGs and possibly Novolog Sensitive Correction tid.   Text page to Dr. Dagmar Hait, RN, BA, MHA, CDE Diabetes Coordinator Inpatient Diabetes Program  [phone] (Team Pager) [phone] ([place]) 06/10/2017 9:23 AM

## 2017-06-11 LAB — BASIC METABOLIC PANEL
Anion gap: 3 — ABNORMAL LOW (ref 5–15)
BUN: 18 mg/dL (ref 6–20)
CO2: 24 mmol/L (ref 22–32)
CREATININE: 0.81 mg/dL (ref 0.61–1.24)
Calcium: 7.6 mg/dL — ABNORMAL LOW (ref 8.9–10.3)
Chloride: 110 mmol/L (ref 101–111)
GFR calc Af Amer: >60 mL/min (ref >60)
GFR calc non Af Amer: >60 mL/min (ref >60)
GLUCOSE: 109 mg/dL — AB (ref 65–99)
Potassium: 3.5 mmol/L (ref 3.5–5.1)
Sodium: 137 mmol/L (ref 135–145)

## 2017-06-11 LAB — CBC
HCT: 25.2 % — ABNORMAL LOW (ref 40.0–52.0)
Hemoglobin: 8.7 g/dL — ABNORMAL LOW (ref 13.0–18.0)
MCH: 31.1 pg (ref 26.0–34.0)
MCHC: 34.6 g/dL (ref 32.0–36.0)
MCV: 89.7 fL (ref 80.0–100.0)
PLATELETS: 97 10*3/uL — AB (ref 150–440)
RBC: 2.81 MIL/uL — ABNORMAL LOW (ref 4.40–5.90)
RDW: 17.1 % — AB (ref 11.5–14.5)
WBC: 4.8 10*3/uL (ref 3.8–10.6)

## 2017-06-11 LAB — GLUCOSE, CAPILLARY
Glucose-Capillary: 107 mg/dL — ABNORMAL HIGH (ref 65–99)
Glucose-Capillary: 224 mg/dL — ABNORMAL HIGH (ref 65–99)

## 2017-06-11 NOTE — Discharge Instructions (Signed)
[facility] [place] at [facility] ° °DIET:  °Cardiac diet ° °DISCHARGE CONDITION:  °Stable ° °ACTIVITY:  °Activity as tolerated ° °OXYGEN:  °Home Oxygen: No. °  °Oxygen Delivery: room air ° °DISCHARGE LOCATION:  °home  ° ° °ADDITIONAL DISCHARGE INSTRUCTION: ° ° °If you experience worsening of your admission symptoms, develop shortness of breath, life threatening emergency, suicidal or homicidal thoughts you must seek medical attention immediately by calling 911 or calling your MD immediately  if symptoms less severe. ° °You Must read complete instructions/literature along with all the possible adverse reactions/side effects for all the Medicines you take and that have been prescribed to you. Take any new Medicines after you have completely understood and accpet all the possible adverse reactions/side effects.  ° °Please note ° °You were cared for by a hospitalist during your hospital stay. If you have any questions about your discharge medications or the care you received while you were in the hospital after you are discharged, you can call the unit and asked to speak with the hospitalist on call if the hospitalist that took care of you is not available. Once you are discharged, your primary care physician will handle any further medical issues. Please note that NO REFILLS for any discharge medications will be authorized once you are discharged, as it is imperative that you return to your primary care physician (or establish a relationship with a primary care physician if you do not have one) for your aftercare needs so that they can reassess your need for medications and monitor your lab values. ° ° °

## 2017-06-11 NOTE — Discharge Summary (Signed)
[facility] - [place] at [facility], 83 y.o., DOB 01/10/1930, MRN [id number]. Admission date: 06/09/2017 Discharge Date 06/11/2017 Primary MD Kirk Ruths, MD Admitting Physician Harrie Foreman, MD  Admission Diagnosis  Unstable angina pectoris [facility]) [I20.0] NSTEMI (non-ST elevated myocardial infarction) [facility]) [I21.4] Atrial fibrillation with rapid ventricular response [facility]) [I48.91]  Discharge Diagnosis   Active Problems:   NSTEMI (non-ST elevated myocardial infarction) ([place])  Coronary artery disease History of prostate cancer Diabetes type 2 Hyperlipidemia Status post aortic valve replacement and CABG Multiple myeloma      Hospital Course  Patient is a 83 year old with history of coronary artery disease, multiple myeloma and multiple other medical problems who presented with chest pain. Patient was noted to have non-ST MI. He was admitted as a consultation by cardiology. And was subsequently taken taken to the cardiac catheter lab. Patient was noted to have in-stent stenosis in the RCA. And underwent PCI with balloon angioplasty. He tolerated the procedure currently he is asymptomatic. He was recommended by cardiology to continue high-dose statin as well as stool antiplatelet therapy. Patient currently doing well and is stable for discharge.          Consults  cardiology  Significant Tests:  See full reports for all details    Dg Chest Portable 1 View  Result Date: 06/10/2017 CLINICAL DATA:  83 year old male with chest pain. EXAM: PORTABLE CHEST 1 VIEW COMPARISON:  Chest radiograph dated 04/29/2017 FINDINGS: Interval increase in the interstitial and perihilar densities with Kerley B-lines likely representing worsening of interstitial edema. Atypical pneumonia is not excluded. Clinical correlation is recommended. There is no focal consolidation, pleural effusion, or pneumothorax. Stable mild cardiomegaly. Median sternotomy wires and  CABG vascular clips noted. Osteopenia with degenerative changes of the spine. No acute osseous pathology. IMPRESSION: Probable mild interstitial edema slightly worsened compared to prior study. Superimposed pneumonia is not excluded. Clinical correlation is recommended. Electronically Signed   By: Anner Crete M.D.   On: 06/10/2017 00:17       Today   Subjective:   Dwayne Terry  feels well denies any chest pain or shortness of breath  Objective:   Blood pressure (!) 111/49, pulse (!) 59, temperature 99.2 F (37.3 C), temperature source Oral, resp. rate 18, height '5\' 8"'  (1.727 m), weight 165 lb 11.2 oz (75.2 kg), SpO2 93 %.  .  Intake/Output Summary (Last 24 hours) at 06/11/17 1335 Last data filed at 06/11/17 0933  Gross per 24 hour  Intake          1104.91 ml  Output              625 ml  Net           479.91 ml    Exam VITAL SIGNS: Blood pressure (!) 111/49, pulse (!) 59, temperature 99.2 F (37.3 C), temperature source Oral, resp. rate 18, height '5\' 8"'  (1.727 m), weight 165 lb 11.2 oz (75.2 kg), SpO2 93 %.  GENERAL:  83 y.o.-year-old patient lying in the bed with no acute distress.  EYES: Pupils equal, round, reactive to light and accommodation. No scleral icterus. Extraocular muscles intact.  HEENT: Head atraumatic, normocephalic. Oropharynx and nasopharynx clear.  NECK:  Supple, no jugular venous distention. No thyroid enlargement, no tenderness.  LUNGS: Normal breath sounds bilaterally, no wheezing, rales,rhonchi or crepitation. No use of accessory muscles of respiration.  CARDIOVASCULAR: S1, S2 normal. No murmurs, rubs, or gallops.  ABDOMEN: Soft, nontender, nondistended. Bowel sounds present.  No organomegaly or mass.  EXTREMITIES: No pedal edema, cyanosis, or clubbing.  NEUROLOGIC: Cranial nerves II through XII are intact. Muscle strength 5/5 in all extremities. Sensation intact. Gait not checked.  PSYCHIATRIC: The patient is alert and oriented x 3.  SKIN: No obvious  rash, lesion, or ulcer.   Data Review     CBC w Diff: Lab Results  Component Value Date   WBC 4.8 06/11/2017   HGB 8.7 (L) 06/11/2017   HGB 13.8 07/10/2013   HCT 25.2 (L) 06/11/2017   HCT 37.9 (L) 07/10/2013   PLT 97 (L) 06/11/2017   PLT 93 (L) 07/10/2013   LYMPHOPCT 27.9 07/09/2013   MONOPCT 8 07/10/2013   MONOPCT 17.4 07/09/2013   EOSPCT 0.2 07/09/2013   BASOPCT 0.4 07/09/2013   CMP: Lab Results  Component Value Date   NA 137 06/11/2017   NA 136 07/10/2013   K 3.5 06/11/2017   K 4.2 07/10/2013   CL 110 06/11/2017   CL 106 07/10/2013   CO2 24 06/11/2017   CO2 27 07/10/2013   BUN 18 06/11/2017   BUN 16 07/10/2013   CREATININE 0.81 06/11/2017   CREATININE 0.97 07/10/2013   PROT 7.8 07/07/2013   ALBUMIN 3.2 (L) 07/07/2013   BILITOT 0.4 07/07/2013   ALKPHOS 59 07/07/2013   AST 22 07/07/2013   ALT 20 07/07/2013  .  Micro Results No results found for this or any previous visit (from the past 240 hour(s)).      Code Status Orders        Start     Ordered   06/10/17 0325  Full code  Continuous     06/10/17 0324    Code Status History    Date Active Date Inactive Code Status Order ID Comments User Context   04/29/2017  5:12 AM 04/30/2017  2:37 PM Full Code [id number]  Harrie Foreman, MD Inpatient   01/27/2017 11:56 PM 01/29/2017  2:30 PM Full Code [id number]  Hugelmeyer, Ubaldo Glassing, DO Inpatient    Advance Directive Documentation     Most Recent Value  Type of Advance Directive  Living will  Pre-existing out of facility DNR order (yellow form or pink MOST form)  -  "MOST" Form in Place?  -          Follow-up Information    Paraschos, Alexander, MD. Schedule an appointment as soon as possible for a visit in 2 weeks.   Specialty:  Cardiology Why:  s/p acute mi f/u  Contact information: [facility] [place] [address] [phone]        Kirk Ruths, MD. Schedule an appointment as soon as possible for  a visit in 1 week.   Specialty:  Internal Medicine Contact information: [place] [address] [phone]           Discharge Medications   Allergies as of 06/11/2017      Reactions   Antihistamines, Chlorpheniramine-type Other (See Comments)   Reaction: unknown   Nitroglycerin Other (See Comments)   Reaction: hypotension      Medication List    TAKE these medications   acetaminophen 325 MG tablet Commonly known as:  TYLENOL Take 650 mg by mouth every 6 (six) hours as needed.   aspirin EC 81 MG tablet Take 1 tablet (81 mg total) by mouth daily.   atorvastatin 80 MG tablet Commonly known as:  LIPITOR Take 80 mg by mouth  daily at 6 PM.   carvedilol 6.25 MG tablet Commonly known as:  COREG Take 1 tablet (6.25 mg total) by mouth 2 (two) times daily with a meal. What changed:  how much to take   clopidogrel 75 MG tablet Commonly known as:  PLAVIX Take 1 tablet (75 mg total) by mouth daily with breakfast.   dexamethasone 4 MG tablet Commonly known as:  DECADRON Take 20 mg by mouth once a week. On wednesdays   finasteride 5 MG tablet Commonly known as:  PROSCAR Take 2.5 mg by mouth daily.   lenalidomide 15 MG capsule Commonly known as:  REVLIMID Take 15 mg by mouth daily.   meloxicam 7.5 MG tablet Commonly known as:  MOBIC Take 7.5 mg by mouth daily.   MULTI-VITAMINS Tabs Take 1 tablet by mouth daily.   simethicone 80 MG chewable tablet Commonly known as:  MYLICON Chew 80 mg by mouth every 6 (six) hours as needed for flatulence.          Total Time in preparing paper work, data evaluation and todays exam - 35 minutes  Dustin Flock M.D on 06/11/2017 at 1:35 PM  [facility]   Office  [phone]

## 2017-06-11 NOTE — Progress Notes (Signed)
[facility] Cardiology [facility] Encounter Note  Patient: Dwayne Terry / Admit Date: 06/09/2017 / Date of Encounter: 06/11/2017, 7:15 AM   Subjective: No more chest pain. Patient improved with medication management Cardiac catheterization showing inferior hypokinesis with ejection fraction of 40% Coronary arteries shows occluded LAD and left circumflex. Patent LIMA to the LAD saphenous vein graft to obtuse marginal Restenosis of proximal right coronary artery and in-stent restenosis with inferior infarct Successful balloon angioplasty of proximal right stenosis with a residual of 20% without stent due to concerns of multiple layers of stents  Review of Systems: Positive for: Shortness of breath Negative for: Vision change, hearing change, syncope, dizziness, nausea, vomiting,diarrhea, bloody stool, stomach pain, cough, congestion, diaphoresis, urinary frequency, urinary pain,skin lesions, skin rashes Others previously listed  Objective: Telemetry: Normal sinus rhythm Physical Exam: Blood pressure (!) 114/53, pulse 65, temperature 98.5 F (36.9 C), temperature source Oral, resp. rate 16, height 5\' 8"  (1.727 m), weight 75.2 kg (165 lb 11.2 oz), SpO2 100 %. Body mass index is 25.19 kg/m. General: Well developed, well nourished, in no acute distress. Head: Normocephalic, atraumatic, sclera non-icteric, no xanthomas, nares are without discharge. Neck: No apparent masses Lungs: Normal respirations with no wheezes, no rhonchi, no rales , no crackles   Heart: Regular rate and rhythm, normal S1 S2, no murmur, no rub, no gallop, PMI is normal size and placement, carotid upstroke normal without bruit, jugular venous pressure normal Abdomen: Soft, non-tender, non-distended with normoactive bowel sounds. No hepatosplenomegaly. Abdominal aorta is normal size without bruit Extremities: No edema, no clubbing, no cyanosis, no ulcers,  Peripheral: 2+ radial, 2+ femoral, 2+ dorsal pedal pulses Neuro:  Alert and oriented. Moves all extremities spontaneously. Psych:  Responds to questions appropriately with a normal affect.   Intake/Output Summary (Last 24 hours) at 06/11/17 0715 Last data filed at 06/11/17 0657  Gross per 24 hour  Intake           864.91 ml  Output             1225 ml  Net          -360.09 ml    Inpatient Medications:  . aspirin EC  81 mg Oral Daily  . atorvastatin  80 mg Oral q1800  . carvedilol  6.25 mg Oral BID WC  . clopidogrel  75 mg Oral Q breakfast  . [START ON 06/15/2017] dexamethasone  20 mg Oral Weekly  . docusate sodium  100 mg Oral BID  . finasteride  2.5 mg Oral Daily  . insulin aspart  0-9 Units Subcutaneous TID WC  . lenalidomide  15 mg Oral Daily  . multivitamin with minerals  1 tablet Oral Daily  . sodium chloride flush  3 mL Intravenous Q12H   Infusions:  . sodium chloride      Labs:  Recent Labs  06/09/17 2334 06/11/17 0526  NA 134* 137  K 4.0 3.5  CL 103 110  CO2 21* 24  GLUCOSE 265* 109*  BUN 24* 18  CREATININE 1.20 0.81  CALCIUM 8.8* 7.6*   No results for input(s): AST, ALT, ALKPHOS, BILITOT, PROT, ALBUMIN in the last 72 hours.  Recent Labs  06/10/17 0950 06/11/17 0526  WBC 5.8 4.8  HGB 9.9* 8.7*  HCT 28.7* 25.2*  MCV 89.3 89.7  PLT 112* 97*    Recent Labs  06/09/17 2334 06/10/17 0558 06/10/17 1120  TROPONINI 0.19* 11.59* 12.27*   Invalid input(s): POCBNP No results for input(s): HGBA1C in the last  72 hours.   Weights: Filed Weights   06/09/17 2337 06/10/17 0339 06/11/17 0500  Weight: 75.3 kg (166 lb) 75.9 kg (167 lb 4.8 oz) 75.2 kg (165 lb 11.2 oz)     Radiology/Studies:  Dg Chest Portable 1 View  Result Date: 06/10/2017 CLINICAL DATA:  83 year old male with chest pain. EXAM: PORTABLE CHEST 1 VIEW COMPARISON:  Chest radiograph dated 04/29/2017 FINDINGS: Interval increase in the interstitial and perihilar densities with Kerley B-lines likely representing worsening of interstitial edema. Atypical  pneumonia is not excluded. Clinical correlation is recommended. There is no focal consolidation, pleural effusion, or pneumothorax. Stable mild cardiomegaly. Median sternotomy wires and CABG vascular clips noted. Osteopenia with degenerative changes of the spine. No acute osseous pathology. IMPRESSION: Probable mild interstitial edema slightly worsened compared to prior study. Superimposed pneumonia is not excluded. Clinical correlation is recommended. Electronically Signed   By: Anner Crete M.D.   On: 06/10/2017 00:17     Assessment and Recommendation  83 y.o. male 83 year old male with diabetes with complication previous coronary artery bypass graft with the new onset of acute non-ST elevation myocardial infarction and inferior hypokinesis with in-stent restenosis of proximal right coronary artery now status post PCI with balloon only of proximal right coronary artery without complication 1. Continue medication management for acute myocardial infarction including Plavix and aspirin 2. Hypertension control with ACE inhibitor beta blocker 3. Rehabilitation has been discussed and ordered 4. High intensity cholesterol therapy with atorvastatin 5. Begin ambulation and follow for improvements and possible discharge to home Today  Signed, Serafina Royals M.D. FACC

## 2017-06-13 ENCOUNTER — Encounter: Payer: Self-pay | Admitting: Internal Medicine

## 2017-08-03 ENCOUNTER — Emergency Department
Admission: EM | Admit: 2017-08-03 | Discharge: 2017-08-03 | Disposition: A | Discharge status: Home / Self Care | Payer: Medicare HMO | Attending: Emergency Medicine | Admitting: Emergency Medicine

## 2017-08-03 ENCOUNTER — Encounter: Payer: Self-pay | Admitting: *Deleted

## 2017-08-03 DIAGNOSIS — Y9389 Activity, other specified: Secondary | ICD-10-CM | POA: Diagnosis not present

## 2017-08-03 DIAGNOSIS — Y999 Unspecified external cause status: Secondary | ICD-10-CM | POA: Diagnosis not present

## 2017-08-03 DIAGNOSIS — Z791 Long term (current) use of non-steroidal anti-inflammatories (NSAID): Secondary | ICD-10-CM | POA: Insufficient documentation

## 2017-08-03 DIAGNOSIS — T22632A Corrosion of second degree of left upper arm, initial encounter: Secondary | ICD-10-CM | POA: Diagnosis not present

## 2017-08-03 DIAGNOSIS — Z79899 Other long term (current) drug therapy: Secondary | ICD-10-CM | POA: Insufficient documentation

## 2017-08-03 DIAGNOSIS — T310 Burns involving less than 10% of body surface: Secondary | ICD-10-CM | POA: Insufficient documentation

## 2017-08-03 DIAGNOSIS — T22631A Corrosion of second degree of right upper arm, initial encounter: Secondary | ICD-10-CM | POA: Diagnosis not present

## 2017-08-03 DIAGNOSIS — Z7902 Long term (current) use of antithrombotics/antiplatelets: Secondary | ICD-10-CM | POA: Insufficient documentation

## 2017-08-03 DIAGNOSIS — Z87891 Personal history of nicotine dependence: Secondary | ICD-10-CM | POA: Insufficient documentation

## 2017-08-03 DIAGNOSIS — E119 Type 2 diabetes mellitus without complications: Secondary | ICD-10-CM | POA: Insufficient documentation

## 2017-08-03 DIAGNOSIS — Y929 Unspecified place or not applicable: Secondary | ICD-10-CM | POA: Insufficient documentation

## 2017-08-03 DIAGNOSIS — T304 Corrosion of unspecified body region, unspecified degree: Secondary | ICD-10-CM

## 2017-08-03 DIAGNOSIS — T528X1A Toxic effect of other organic solvents, accidental (unintentional), initial encounter: Secondary | ICD-10-CM | POA: Insufficient documentation

## 2017-08-03 DIAGNOSIS — X58XXXA Exposure to other specified factors, initial encounter: Secondary | ICD-10-CM | POA: Insufficient documentation

## 2017-08-03 DIAGNOSIS — S4990XA Unspecified injury of shoulder and upper arm, unspecified arm, initial encounter: Secondary | ICD-10-CM | POA: Diagnosis present

## 2017-08-03 DIAGNOSIS — Z7982 Long term (current) use of aspirin: Secondary | ICD-10-CM | POA: Insufficient documentation

## 2017-08-03 MED ORDER — SILVER SULFADIAZINE 1 % EX CREA
TOPICAL_CREAM | Freq: Once | CUTANEOUS | Status: AC
Start: 2017-08-03 — End: 2017-08-03
  Administered 2017-08-03: 1 via TOPICAL
  Filled 2017-08-03: qty 85

## 2017-08-03 MED ORDER — CEPHALEXIN 500 MG PO CAPS
500.0000 mg | ORAL_CAPSULE | Freq: Once | ORAL | Status: AC
  Administered 2017-08-03: 500 mg via ORAL
  Filled 2017-08-03: qty 1

## 2017-08-03 MED ORDER — SILVER SULFADIAZINE 1 % EX CREA: TOPICAL_CREAM | CUTANEOUS | 1 refills | Status: AC

## 2017-08-03 MED ORDER — CEPHALEXIN 500 MG PO CAPS: 500.0000 mg | ORAL_CAPSULE | Freq: Three times a day (TID) | ORAL | 0 refills | Status: AC
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
## 2017-08-03 NOTE — Discharge Instructions (Signed)
Please continue to apply silver sulfadiazine ointment daily. Take antibiotics as prescribed and return to the ER for any redness, swelling, increasing pain or fevers. Take Tylenol as needed for pain.

## 2017-08-03 NOTE — ED Notes (Signed)
Pt to lobby with wife using cane. No acute distress and pt verbalized understanding of discharge and home care.
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
## 2017-09-16 ENCOUNTER — Other Ambulatory Visit: Payer: Self-pay

## 2017-09-16 ENCOUNTER — Emergency Department: Payer: Medicare HMO

## 2017-09-16 ENCOUNTER — Inpatient Hospital Stay
Admission: EM | Admit: 2017-09-16 | Discharge: 2017-09-20 | DRG: 246 | Disposition: A | Discharge status: Home / Self Care | Payer: Medicare HMO | Attending: Internal Medicine | Admitting: Internal Medicine

## 2017-09-16 DIAGNOSIS — I5023 Acute on chronic systolic (congestive) heart failure: Secondary | ICD-10-CM | POA: Diagnosis present

## 2017-09-16 DIAGNOSIS — Z888 Allergy status to other drugs, medicaments and biological substances status: Secondary | ICD-10-CM | POA: Diagnosis not present

## 2017-09-16 DIAGNOSIS — I252 Old myocardial infarction: Secondary | ICD-10-CM

## 2017-09-16 DIAGNOSIS — Y831 Surgical operation with implant of artificial internal device as the cause of abnormal reaction of the patient, or of later complication, without mention of misadventure at the time of the procedure: Secondary | ICD-10-CM | POA: Diagnosis present

## 2017-09-16 DIAGNOSIS — D696 Thrombocytopenia, unspecified: Secondary | ICD-10-CM | POA: Diagnosis present

## 2017-09-16 DIAGNOSIS — Z951 Presence of aortocoronary bypass graft: Secondary | ICD-10-CM

## 2017-09-16 DIAGNOSIS — E785 Hyperlipidemia, unspecified: Secondary | ICD-10-CM | POA: Diagnosis present

## 2017-09-16 DIAGNOSIS — I340 Nonrheumatic mitral (valve) insufficiency: Secondary | ICD-10-CM | POA: Diagnosis present

## 2017-09-16 DIAGNOSIS — Z7984 Long term (current) use of oral hypoglycemic drugs: Secondary | ICD-10-CM

## 2017-09-16 DIAGNOSIS — Z952 Presence of prosthetic heart valve: Secondary | ICD-10-CM | POA: Diagnosis not present

## 2017-09-16 DIAGNOSIS — I2510 Atherosclerotic heart disease of native coronary artery without angina pectoris: Secondary | ICD-10-CM | POA: Diagnosis present

## 2017-09-16 DIAGNOSIS — T82855A Stenosis of coronary artery stent, initial encounter: Secondary | ICD-10-CM | POA: Diagnosis present

## 2017-09-16 DIAGNOSIS — N400 Enlarged prostate without lower urinary tract symptoms: Secondary | ICD-10-CM | POA: Diagnosis present

## 2017-09-16 DIAGNOSIS — Z7902 Long term (current) use of antithrombotics/antiplatelets: Secondary | ICD-10-CM

## 2017-09-16 DIAGNOSIS — Z7982 Long term (current) use of aspirin: Secondary | ICD-10-CM

## 2017-09-16 DIAGNOSIS — E782 Mixed hyperlipidemia: Secondary | ICD-10-CM | POA: Diagnosis present

## 2017-09-16 DIAGNOSIS — I447 Left bundle-branch block, unspecified: Secondary | ICD-10-CM | POA: Diagnosis present

## 2017-09-16 DIAGNOSIS — E119 Type 2 diabetes mellitus without complications: Secondary | ICD-10-CM | POA: Diagnosis present

## 2017-09-16 DIAGNOSIS — Z8546 Personal history of malignant neoplasm of prostate: Secondary | ICD-10-CM | POA: Diagnosis not present

## 2017-09-16 DIAGNOSIS — Z87891 Personal history of nicotine dependence: Secondary | ICD-10-CM

## 2017-09-16 DIAGNOSIS — I110 Hypertensive heart disease with heart failure: Secondary | ICD-10-CM | POA: Diagnosis present

## 2017-09-16 DIAGNOSIS — Z8579 Personal history of other malignant neoplasms of lymphoid, hematopoietic and related tissues: Secondary | ICD-10-CM | POA: Diagnosis not present

## 2017-09-16 DIAGNOSIS — I214 Non-ST elevation (NSTEMI) myocardial infarction: Secondary | ICD-10-CM | POA: Diagnosis present

## 2017-09-16 LAB — CBC WITH DIFFERENTIAL/PLATELET
BASOS PCT: 0 %
Basophils Absolute: 0 10*3/uL (ref 0–0.1)
EOS ABS: 0 10*3/uL (ref 0–0.7)
Eosinophils Relative: 0 %
HCT: 37.1 % — ABNORMAL LOW (ref 40.0–52.0)
HEMOGLOBIN: 12.7 g/dL — AB (ref 13.0–18.0)
Lymphocytes Relative: 6 %
Lymphs Abs: 0.8 10*3/uL — ABNORMAL LOW (ref 1.0–3.6)
MCH: 32.1 pg (ref 26.0–34.0)
MCHC: 34.2 g/dL (ref 32.0–36.0)
MCV: 93.7 fL (ref 80.0–100.0)
Monocytes Absolute: 0.6 10*3/uL (ref 0.2–1.0)
Monocytes Relative: 4 %
NEUTROS PCT: 90 %
Neutro Abs: 11.4 10*3/uL — ABNORMAL HIGH (ref 1.4–6.5)
PLATELETS: 79 10*3/uL — AB (ref 150–440)
RBC: 3.96 MIL/uL — AB (ref 4.40–5.90)
RDW: 17.3 % — ABNORMAL HIGH (ref 11.5–14.5)
WBC: 12.8 10*3/uL — AB (ref 3.8–10.6)

## 2017-09-16 LAB — HEPATIC FUNCTION PANEL
ALBUMIN: 3.6 g/dL (ref 3.5–5.0)
ALK PHOS: 60 U/L (ref 38–126)
ALT: 24 U/L (ref 17–63)
AST: 29 U/L (ref 15–41)
BILIRUBIN TOTAL: 1 mg/dL (ref 0.3–1.2)
Bilirubin, Direct: 0.2 mg/dL (ref 0.1–0.5)
Indirect Bilirubin: 0.8 mg/dL (ref 0.3–0.9)
TOTAL PROTEIN: 7.1 g/dL (ref 6.5–8.1)

## 2017-09-16 LAB — BASIC METABOLIC PANEL
Anion gap: 10 (ref 5–15)
BUN: 27 mg/dL — ABNORMAL HIGH (ref 6–20)
CO2: 20 mmol/L — AB (ref 22–32)
CREATININE: 1.13 mg/dL (ref 0.61–1.24)
Calcium: 9.4 mg/dL (ref 8.9–10.3)
Chloride: 107 mmol/L (ref 101–111)
GFR calc non Af Amer: 57 mL/min — ABNORMAL LOW (ref >60)
Glucose, Bld: 187 mg/dL — ABNORMAL HIGH (ref 65–99)
Potassium: 3.8 mmol/L (ref 3.5–5.1)
SODIUM: 137 mmol/L (ref 135–145)

## 2017-09-16 LAB — TROPONIN I
TROPONIN I: 0.63 ng/mL — AB (ref <0.03)
Troponin I: 0.15 ng/mL (ref <0.03)

## 2017-09-16 LAB — PROTIME-INR
INR: 1.11
Prothrombin Time: 14.2 seconds (ref 11.4–15.2)

## 2017-09-16 LAB — APTT: APTT: 24 s (ref 24–36)

## 2017-09-16 LAB — BRAIN NATRIURETIC PEPTIDE: B NATRIURETIC PEPTIDE 5: 560 pg/mL — AB (ref 0.0–100.0)

## 2017-09-16 MED ORDER — ENALAPRILAT 1.25 MG/ML IV SOLN
1.2500 mg | Freq: Once | INTRAVENOUS | Status: AC
  Administered 2017-09-16: 1.25 mg via INTRAVENOUS
  Filled 2017-09-16: qty 2

## 2017-09-16 MED ORDER — FUROSEMIDE 10 MG/ML IJ SOLN
20.0000 mg | Freq: Once | INTRAMUSCULAR | Status: AC
  Administered 2017-09-16: 20 mg via INTRAVENOUS
  Filled 2017-09-16: qty 4

## 2017-09-16 MED ORDER — HEPARIN (PORCINE) IN NACL 100-0.45 UNIT/ML-% IJ SOLN
950.0000 [IU]/h | INTRAMUSCULAR | Status: DC
  Administered 2017-09-16 – 2017-09-18 (×3): 950 [IU]/h via INTRAVENOUS
  Filled 2017-09-16 (×3): qty 250

## 2017-09-16 MED ORDER — ASPIRIN 81 MG PO CHEW
324.0000 mg | CHEWABLE_TABLET | Freq: Once | ORAL | Status: AC
  Administered 2017-09-16: 324 mg via ORAL
  Filled 2017-09-16: qty 4

## 2017-09-16 NOTE — ED Triage Notes (Signed)
Pt came to ED via EMS from home c/o difficulty breathing and a "gas" feeling in chest. Pt has bone marrow cancer. Came to ED on cpap, took pt off upom arrival, satting 95% on 2L. VS stable at this time.

## 2017-09-16 NOTE — ED Provider Notes (Signed)
[facility] Emergency Department Provider Note  ____________________________________________   First MD Initiated Contact with Patient 09/16/17 1830     (approximate)  I have reviewed the triage vital signs and the nursing notes.   HISTORY  Chief Complaint Shortness of Breath and Chest Pain    HPI Dwayne Terry is a 83 y.o. male who comes in the emergency department via EMS with roughly 24 hours of progressive shortness of breath. He has a complex past medical history including significant coronary artery disease as well as congestive heart failure. He takes Plavix and aspirin. He most recently had a cardiac catheterization 3 months ago and his results are below. Shortness of breath today had a gradual onset and has been slowly progressive. He sleeps on one pillow but does note bilateral lower extremity edema. Nothing in particular seems to make his symptoms better or worse. He is not having chest pain.   06/10/17 Cath:   Colon Flattery LAD to Prox LAD lesion, 100 %stenosed.  Ost Cx to Prox Cx lesion, 100 %stenosed.  SVG and is moderate in size.  1st Diag lesion, 40 %stenosed.  LIMA and is moderate in size.  Ost RCA to Prox RCA lesion, 0 %stenosed.  A drug eluting .  Prox RCA to Mid RCA lesion, 0 %stenosed.  Mid RCA lesion, 75 %stenosed.  Dist RCA lesion, 60 %stenosed.  RPDA lesion, 75 %stenosed.  Dist Graft lesion, 60 %stenosed.   Assessment The patient has had an acute non-ST elevation myocardial infarction with causes and risk factors including high blood pressure and high cholesterol.  Left ventricular angiogram shows abnormal LV systolic function ejection fraction of 40% there is hypokinesis of the inferior myocardium. All other myocardium has normal heart function  Native vessels show occlusion of proximal circumflex and left anterior descending artery with significant in-stent restenosis of proximal right coronary artery of 85% and mid to  distal right coronary artery of 70% with the 85% PDA. Port area is proximal right coronary artery area There is a patent and normal LIMA to the LAD and patent and normal left circumflex graft   Past Medical History:  Diagnosis Date  . Cancer ([place])    bone marrow, prostate cancer  . Diabetes mellitus without complication ([place])   . Hyperlipidemia   . MI (mitral incompetence)   . MI (myocardial infarction) [facility])     Patient Active Problem List   Diagnosis Date Noted  . Chest pain 04/29/2017  . NSTEMI (non-ST elevated myocardial infarction) ([place]) 04/29/2017  . Chest pain, rule out acute myocardial infarction 01/27/2017    Past Surgical History:  Procedure Laterality Date  . AORTIC VALVE REPLACEMENT (AVR)/CORONARY ARTERY BYPASS GRAFTING (CABG)    . CORONARY ANGIOPLASTY WITH STENT PLACEMENT    . CORONARY BALLOON ANGIOPLASTY N/A 06/10/2017   Procedure: Coronary Balloon Angioplasty;  Surgeon: Wellington Hampshire, MD;  Location: [place] [facility];  Service: Cardiovascular;  Laterality: N/A;  . CORONARY STENT INTERVENTION N/A 01/28/2017   Procedure: Coronary Stent Intervention;  Surgeon: [name], MD;  Location: [place] [facility];  Service: Cardiovascular;  Laterality: N/A;  . CORONARY STENT INTERVENTION N/A 04/29/2017   Procedure: Coronary Stent Intervention;  Surgeon: [name], MD;  Location: [place] [facility];  Service: Cardiovascular;  Laterality: N/A;  . LEFT HEART CATH AND CORONARY ANGIOGRAPHY N/A 01/28/2017   Procedure: Left Heart Cath and Coronary Angiography;  Surgeon: [name], MD;  Location: [place] [facility];  Service: Cardiovascular;  Laterality:  N/A;  . LEFT HEART CATH AND CORS/GRAFTS ANGIOGRAPHY N/A 04/29/2017   Procedure: Left Heart Cath and Cors/Grafts Angiography;  Surgeon: [name], MD;  Location: [place] [facility];  Service: Cardiovascular;  Laterality: N/A;  . LEFT HEART CATH AND CORS/GRAFTS ANGIOGRAPHY N/A 06/10/2017    Procedure: Left Heart Cath and Cors/Grafts Angiography;  Surgeon: Corey Skains, MD;  Location: [place] [facility];  Service: Cardiovascular;  Laterality: N/A;    Prior to Admission medications   Medication Sig Start Date End Date Taking? Authorizing Provider  acetaminophen (TYLENOL) 325 MG tablet Take 650 mg by mouth every 6 (six) hours as needed.   Yes [provider]  acyclovir (ZOVIRAX) 400 MG tablet Take 400 mg by mouth 2 (two) times daily.   Yes [provider]  aspirin EC 81 MG tablet Take 1 tablet (81 mg total) by mouth daily. 01/29/17  Yes Loletha Grayer, MD  atorvastatin (LIPITOR) 80 MG tablet Take 80 mg by mouth daily at 6 PM.   Yes [provider]  carvedilol (COREG) 6.25 MG tablet Take 1 tablet (6.25 mg total) by mouth 2 (two) times daily with a meal. Patient taking differently: Take 3.25 mg by mouth 2 (two) times daily with a meal.  01/29/17  Yes Leslye Peer, Richard, MD  clopidogrel (PLAVIX) 75 MG tablet Take 1 tablet (75 mg total) by mouth daily with breakfast. 04/30/17  Yes Sainani, Belia Heman, MD  dexamethasone (DECADRON) 2 MG tablet Take 8.5 mg by mouth See admin instructions. Take 8.5 mg on Thursday and Friday.   Yes [provider]  finasteride (PROSCAR) 5 MG tablet Take 2.5 mg by mouth daily.    Yes [provider]  metFORMIN (GLUCOPHAGE) 500 MG tablet Take 500 mg by mouth 2 (two) times daily with a meal.   Yes [provider]  Multiple Vitamin (MULTI-VITAMINS) TABS Take 1 tablet by mouth daily.   Yes [provider]  silver sulfADIAZINE (SILVADENE) 1 % cream Apply to affected area daily Patient not taking: Reported on 09/16/2017 08/03/17 08/03/18  Duanne Guess, PA-C    Allergies Antihistamines, chlorpheniramine-type and Nitroglycerin  Family History  Problem Relation Age of Onset  . Heart disease Other     Social History Social History  Substance Use Topics  . Smoking status: Former Research scientist (life sciences)  . Smokeless  tobacco: Former Systems developer  . Alcohol use No    Review of Systems Constitutional: No fever/chills Eyes: No visual changes. ENT: No sore throat. Cardiovascular: Denies chest pain. Respiratory: positive for shortness of breath. Gastrointestinal: No abdominal pain.  No nausea, no vomiting.  No diarrhea.  No constipation. Genitourinary: Negative for dysuria. Musculoskeletal: Negative for back pain. Skin: Negative for rash. Neurological: Negative for headaches, focal weakness or numbness.   ____________________________________________   PHYSICAL EXAM:  VITAL SIGNS: ED Triage Vitals  Enc Vitals Group     BP      Pulse      Resp      Temp      Temp src      SpO2      Weight      Height      Head Circumference      Peak Flow      Pain Score      Pain Loc      Pain Edu?      Excl. in [place]?     Constitutional: alert and oriented 4 tolerating CPAP and when it's removes his breathing with an  elevated respiratory rate although able to speak Eyes: PERRL EOMI. Head: Atraumatic. Nose: No congestion/rhinnorhea. Mouth/Throat: No trismus Neck: No stridor.  able to lie completely flat Cardiovascular: Normal rate, regular rhythm. Grossly normal heart sounds.  Good peripheral circulation. Respiratory: increased respiratory effort with rales at bilateral bases Gastrointestinal: soft nontender Musculoskeletal: 1+ pitting edema to mention bilaterally legs equal in size  Neurologic:   No gross focal neurologic deficits are appreciated. Skin:  Skin is warm, dry and intact. No rash noted. Psychiatric: Mood and affect are normal. Speech and behavior are normal.    ____________________________________________   DIFFERENTIAL includes but not limited to  CHF exacerbation, acute coronary syndrome, pulmonary edema, pulmonary embolism, pneumonia, pneumothorax ____________________________________________   LABS (all labs ordered are listed, but only abnormal results are displayed)  Labs  Reviewed  BASIC METABOLIC PANEL - Abnormal; Notable for the following:       Result Value   CO2 20 (*)    Glucose, Bld 187 (*)    BUN 27 (*)    GFR calc non Af Amer 57 (*)    All other components within normal limits  BRAIN NATRIURETIC PEPTIDE - Abnormal; Notable for the following:    B Natriuretic Peptide 560.0 (*)    All other components within normal limits  TROPONIN I - Abnormal; Notable for the following:    Troponin I 0.15 (*)    All other components within normal limits  CBC WITH DIFFERENTIAL/PLATELET - Abnormal; Notable for the following:    WBC 12.8 (*)    RBC 3.96 (*)    Hemoglobin 12.7 (*)    HCT 37.1 (*)    RDW 17.3 (*)    Platelets 79 (*)    Neutro Abs 11.4 (*)    Lymphs Abs 0.8 (*)    All other components within normal limits  TROPONIN I - Abnormal; Notable for the following:    Troponin I 0.63 (*)    All other components within normal limits  HEPATIC FUNCTION PANEL  APTT  PROTIME-INR  CBC  HEPARIN LEVEL (UNFRACTIONATED)    blood work reviewed and interpreted by me shows rising troponin concerning for primary cardiac ischemia. He does have thrombocytopenia slightly worse than baseline __________________________________________  EKG  ED ECG REPORT I, Darel Hong, the attending physician, personally viewed and interpreted this ECG.  Date: 09/16/2017 EKG Time:  Rate: 82 Rhythm: normal sinus rhythm QRS Axis: leftward Intervals: first degree AV block as well as a prolonged QTC ST/T Wave abnormalities: normal Narrative Interpretation: no evidence of acute ischemia  ____________________________________________  RADIOLOGY  chest x-ray reviewed by me shows small right pleural effusion and interstitial edema concerning for fluid overload ____________________________________________   PROCEDURES  Procedure(s) performed: no  Procedures  Critical Care performed: yes  CRITICAL CARE Performed by: Darel Hong   Total critical care time: 40  minutes  Critical care time was exclusive of separately billable procedures and treating other patients.  Critical care was necessary to treat or prevent imminent or life-threatening deterioration.  Critical care was time spent personally by me on the following activities: development of treatment plan with patient and/or surrogate as well as nursing, discussions with consultants, evaluation of patient's response to treatment, examination of patient, obtaining history from patient or surrogate, ordering and performing treatments and interventions, ordering and review of laboratory studies, ordering and review of radiographic studies, pulse oximetry and re-evaluation of patient's condition.   Observation: no   INITIAL IMPRESSION / ASSESSMENT AND PLAN / ED COURSE  Pertinent labs & imaging results that were available during my care of the patient were reviewed by me and considered in my medical decision making (see chart for details).  The patient arrives on CPAP, however were removed it when he arrived and he was tolerating 2 L nasal cannula without difficulty. He is able to lie completely flat. He does have rales in bilateral lower lobes and bilateral lower extremity edema.  chest x-ray and blood work are pending. She does not have a STEMI.     ----------------------------------------- 8:41 PM on 09/16/2017 ----------------------------------------- The patient feels improved.  first troponin slightly elevated but nonspecific. Second troponin pending.  ____________________________________________   ----------------------------------------- 9:55 PM on 09/16/2017 -----------------------------------------  The patient's second troponin is markedly elevated concerning for non-ST elevation myocardial infarction. 324 mg of aspirin as well as a heparin drip now and the patient will require inpatient admission.   ----------------------------------------- 10:07 PM on  09/16/2017 -----------------------------------------  I discussed the case with the patient's cardiologist Dr. Nehemiah Massed who agrees with heparin and admission and he will kindly consult on the patient.  FINAL CLINICAL IMPRESSION(S) / ED DIAGNOSES  Final diagnoses:  NSTEMI (non-ST elevated myocardial infarction) ([place])      NEW MEDICATIONS STARTED DURING THIS VISIT:  New Prescriptions   No medications on file     Note:  This document was prepared using Dragon voice recognition software and may include unintentional dictation errors.     Darel Hong, MD 09/16/17 2252

## 2017-09-16 NOTE — Progress Notes (Signed)
ANTICOAGULATION CONSULT NOTE - Initial Consult  Pharmacy Consult for Heparin drip Indication: chest pain/ACS  Allergies  Allergen Reactions  . Antihistamines, Chlorpheniramine-Type Other (See Comments)    Reaction: unknown  . Nitroglycerin Other (See Comments)    Reaction: hypotension    Patient Measurements: Height: 5\' 8"  (172.7 cm) Weight: 170 lb (77.1 kg) IBW/kg (Calculated) : 68.4 Heparin Dosing Weight: 77.1 kg  Vital Signs: Temp: 97.7 F (36.5 C) (10/19 1846) Temp Source: Oral (10/19 1846) BP: 145/80 (10/19 2200) Pulse Rate: 70 (10/19 2200)  Labs:  Recent Labs  09/16/17 1831 09/16/17 2103  HGB 12.7*  --   HCT 37.1*  --   PLT 79*  --   CREATININE 1.13  --   TROPONINI 0.15* 0.63*    Estimated Creatinine Clearance: 46.2 mL/min (by C-G formula based on SCr of 1.13 mg/dL).     Assessment: 83 yo male starting on heparin. No oral anticoagulants noted on PTA med list.  Plt count noted to be 61 - discussed with ordering ED MD who stated not to bolus heparin.    Goal of Therapy:  Heparin level 0.3-0.7 units/ml Monitor platelets by anticoagulation protocol: Yes   Plan:  STAT aPTT and INR ordered No bolus Start heparin drip at 950 units/hr (=9.5 ml/hr) Heparin level in 8h CBC in AM  Rayna Sexton L 09/16/2017,10:14 PM

## 2017-09-16 NOTE — H&P (Signed)
[place] [facility] [place] NAME: Dwayne Terry    MR#:  [id number]  DATE OF BIRTH:  09/14/1930  DATE OF ADMISSION:  09/16/2017  PRIMARY CARE PHYSICIAN: Kirk Ruths, MD   REQUESTING/REFERRING PHYSICIAN: Mable Paris, MD  CHIEF COMPLAINT:   Chief Complaint  Patient presents with  . Shortness of Breath  . Chest Pain    HISTORY OF PRESENT ILLNESS:  Dwayne Terry  is a 83 y.o. male who presents with chest pain. Patient has a known history of coronary disease, sudden multiple heart attacks in the past, has had CABG and presents to the ED tonight with persistent chest pain. His troponin was mildly elevated initially, second troponin was further increased. Hospitalists were called for admission  PAST MEDICAL HISTORY:   Past Medical History:  Diagnosis Date  . Cancer ([place])    bone marrow, prostate cancer  . Diabetes mellitus without complication ([place])   . Hyperlipidemia   . MI (mitral incompetence)   . MI (myocardial infarction) ([place])     PAST SURGICAL HISTORY:   Past Surgical History:  Procedure Laterality Date  . AORTIC VALVE REPLACEMENT (AVR)/CORONARY ARTERY BYPASS GRAFTING (CABG)    . CORONARY ANGIOPLASTY WITH STENT PLACEMENT    . CORONARY BALLOON ANGIOPLASTY N/A 06/10/2017   Procedure: Coronary Balloon Angioplasty;  Surgeon: Wellington Hampshire, MD;  Location: [place] [facility];  Service: Cardiovascular;  Laterality: N/A;  . CORONARY STENT INTERVENTION N/A 01/28/2017   Procedure: Coronary Stent Intervention;  Surgeon: [name], MD;  Location: [place] [facility];  Service: Cardiovascular;  Laterality: N/A;  . CORONARY STENT INTERVENTION N/A 04/29/2017   Procedure: Coronary Stent Intervention;  Surgeon: [name], MD;  Location: [place] [facility];  Service: Cardiovascular;  Laterality: N/A;  . LEFT HEART CATH AND CORONARY ANGIOGRAPHY N/A 01/28/2017   Procedure: Left Heart Cath and Coronary Angiography;   Surgeon: [name], MD;  Location: [place] [facility];  Service: Cardiovascular;  Laterality: N/A;  . LEFT HEART CATH AND CORS/GRAFTS ANGIOGRAPHY N/A 04/29/2017   Procedure: Left Heart Cath and Cors/Grafts Angiography;  Surgeon: [name], MD;  Location: [place] [facility];  Service: Cardiovascular;  Laterality: N/A;  . LEFT HEART CATH AND CORS/GRAFTS ANGIOGRAPHY N/A 06/10/2017   Procedure: Left Heart Cath and Cors/Grafts Angiography;  Surgeon: Corey Skains, MD;  Location: [place] [facility];  Service: Cardiovascular;  Laterality: N/A;    SOCIAL HISTORY:   Social History  Substance Use Topics  . Smoking status: Former Research scientist (life sciences)  . Smokeless tobacco: Former Systems developer  . Alcohol use No    FAMILY HISTORY:   Family History  Problem Relation Age of Onset  . Heart disease Other     DRUG ALLERGIES:   Allergies  Allergen Reactions  . Antihistamines, Chlorpheniramine-Type Other (See Comments)    Reaction: unknown  . Nitroglycerin Other (See Comments)    Reaction: hypotension    MEDICATIONS AT HOME:   Prior to Admission medications   Medication Sig Start Date End Date Taking? Authorizing Provider  acetaminophen (TYLENOL) 325 MG tablet Take 650 mg by mouth every 6 (six) hours as needed.   Yes [provider]  acyclovir (ZOVIRAX) 400 MG tablet Take 400 mg by mouth 2 (two) times daily.   Yes [provider]  aspirin EC 81 MG tablet Take 1 tablet (81 mg total) by mouth daily. 01/29/17  Yes Wieting, Richard, MD  atorvastatin (LIPITOR) 80 MG tablet Take 80 mg by  mouth daily at 6 PM.   Yes [provider]  carvedilol (COREG) 6.25 MG tablet Take 1 tablet (6.25 mg total) by mouth 2 (two) times daily with a meal. Patient taking differently: Take 3.25 mg by mouth 2 (two) times daily with a meal.  01/29/17  Yes Loletha Grayer, MD  clopidogrel (PLAVIX) 75 MG tablet Take 1 tablet (75 mg total) by mouth daily with breakfast. 04/30/17  Yes Sainani, Belia Heman, MD  dexamethasone (DECADRON) 2 MG tablet Take 8.5 mg by mouth See admin instructions. Take 8.5 mg on Thursday and Friday.   Yes [provider]  finasteride (PROSCAR) 5 MG tablet Take 2.5 mg by mouth daily.    Yes [provider]  metFORMIN (GLUCOPHAGE) 500 MG tablet Take 500 mg by mouth 2 (two) times daily with a meal.   Yes [provider]  Multiple Vitamin (MULTI-VITAMINS) TABS Take 1 tablet by mouth daily.   Yes [provider]  silver sulfADIAZINE (SILVADENE) 1 % cream Apply to affected area daily Patient not taking: Reported on 09/16/2017 08/03/17 08/03/18  Duanne Guess, PA-C    REVIEW OF SYSTEMS:  Review of Systems  Constitutional: Negative for chills, fever, malaise/fatigue and weight loss.  HENT: Negative for ear pain, hearing loss and tinnitus.   Eyes: Negative for blurred vision, double vision, pain and redness.  Respiratory: Negative for cough, hemoptysis and shortness of breath.   Cardiovascular: Positive for chest pain. Negative for palpitations, orthopnea and leg swelling.  Gastrointestinal: Negative for abdominal pain, constipation, diarrhea, nausea and vomiting.  Genitourinary: Negative for dysuria, frequency and hematuria.  Musculoskeletal: Negative for back pain, joint pain and neck pain.  Skin:       No acne, rash, or lesions  Neurological: Negative for dizziness, tremors, focal weakness and weakness.  Endo/Heme/Allergies: Negative for polydipsia. Does not bruise/bleed easily.  Psychiatric/Behavioral: Negative for depression. The patient is not nervous/anxious and does not have insomnia.      VITAL SIGNS:   Vitals:   09/16/17 2100 09/16/17 2130 09/16/17 2200 09/16/17 2311  BP: 132/83 134/86 (!) 145/80 (!) 150/92  Pulse: 74 69 70 70  Resp: 19 20 (!) 21 20  Temp:      TempSrc:      SpO2: 94% 95% 94% 94%  Weight:      Height:       Wt Readings from Last 3 Encounters:  09/16/17 77.1 kg (170 lb)  08/03/17 75.8 kg (167 lb)   06/11/17 75.2 kg (165 lb 11.2 oz)    PHYSICAL EXAMINATION:  Physical Exam  Vitals reviewed. Constitutional: He is oriented to person, place, and time. He appears well-developed and well-nourished. No distress.  HENT:  Head: Normocephalic and atraumatic.  Mouth/Throat: Oropharynx is clear and moist.  Eyes: Pupils are equal, round, and reactive to light. Conjunctivae and EOM are normal. No scleral icterus.  Neck: Normal range of motion. Neck supple. No JVD present. No thyromegaly present.  Cardiovascular: Normal rate, regular rhythm and intact distal pulses.  Exam reveals no gallop and no friction rub.   Murmur heard. Respiratory: Effort normal and breath sounds normal. No respiratory distress. He has no wheezes. He has no rales.  GI: Soft. Bowel sounds are normal. He exhibits no distension. There is no tenderness.  Musculoskeletal: Normal range of motion. He exhibits no edema.  No arthritis, no gout  Lymphadenopathy:    He has no cervical adenopathy.  Neurological: He is alert and oriented to person, place, and time.  No cranial nerve deficit.  No dysarthria, no aphasia  Skin: Skin is warm and dry. No rash noted. No erythema.  Psychiatric: He has a normal mood and affect. His behavior is normal. Judgment and thought content normal.    LABORATORY PANEL:   CBC  Recent Labs Lab 09/16/17 1831  WBC 12.8*  HGB 12.7*  HCT 37.1*  PLT 79*   ------------------------------------------------------------------------------------------------------------------  Chemistries   Recent Labs Lab 09/16/17 1831  NA 137  K 3.8  CL 107  CO2 20*  GLUCOSE 187*  BUN 27*  CREATININE 1.13  CALCIUM 9.4  AST 29  ALT 24  ALKPHOS 60  BILITOT 1.0   ------------------------------------------------------------------------------------------------------------------  Cardiac Enzymes  Recent Labs Lab 09/16/17 2103  TROPONINI 0.63*    ------------------------------------------------------------------------------------------------------------------  RADIOLOGY:  Dg Chest Port 1 View  Result Date: 09/16/2017 CLINICAL DATA:  Shortness of breath and chest pain EXAM: PORTABLE CHEST 1 VIEW COMPARISON:  Chest radiograph 06/10/2017 FINDINGS: Worsening interstitial opacities compared to the prior examination. Status post CABG. Small right pleural effusion. No pneumothorax. IMPRESSION: Small right pleural effusion and basilar predominant mild interstitial pulmonary edema. Electronically Signed   By: Ulyses Jarred M.D.   On: 09/16/2017 19:01    EKG:   Orders placed or performed during the hospital encounter of 09/16/17  . ED EKG  . ED EKG    IMPRESSION AND PLAN:  Principal Problem:   NSTEMI (non-ST elevated myocardial infarction) ([place]) - IV heparin, cycle cardiac enzymes, cardiology consult Active Problems:   Diabetes ([place]) - sliding scale insulin corresponding glucose checks   HLD (hyperlipidemia) - continue home meds  All the records are reviewed and case discussed with ED provider. Management plans discussed with the patient and/or family.  DVT PROPHYLAXIS: Systemic anticoagulation  GI PROPHYLAXIS: None  ADMISSION STATUS: Inpatient  CODE STATUS: Full Code Status History    Date Active Date Inactive Code Status Order ID Comments User Context   06/10/2017  3:24 AM 06/11/2017  4:13 PM Full Code [id number]  Harrie Foreman, MD Inpatient   04/29/2017  5:12 AM 04/30/2017  2:37 PM Full Code [id number]  Harrie Foreman, MD Inpatient   01/27/2017 11:56 PM 01/29/2017  2:30 PM Full Code [id number]  Hugelmeyer, Ubaldo Glassing, DO Inpatient      TOTAL TIME TAKING CARE OF THIS PATIENT: 45 minutes.   Hayzlee Mcsorley [place] 09/16/2017, 11:14 PM  [employer]  [phone]  CC: Primary care physician; Kirk Ruths, MD  Note:  This document was prepared using Dragon voice recognition software and may  include unintentional dictation errors.

## 2017-09-16 NOTE — ED Notes (Signed)
One set of cultures sent down to lab

## 2017-09-17 LAB — CBC
HEMATOCRIT: 33.1 % — AB (ref 40.0–52.0)
HEMOGLOBIN: 11.6 g/dL — AB (ref 13.0–18.0)
MCH: 32.5 pg (ref 26.0–34.0)
MCHC: 35.1 g/dL (ref 32.0–36.0)
MCV: 92.6 fL (ref 80.0–100.0)
Platelets: 76 10*3/uL — ABNORMAL LOW (ref 150–440)
RBC: 3.57 MIL/uL — AB (ref 4.40–5.90)
RDW: 17.2 % — ABNORMAL HIGH (ref 11.5–14.5)
WBC: 11.1 10*3/uL — ABNORMAL HIGH (ref 3.8–10.6)

## 2017-09-17 LAB — BASIC METABOLIC PANEL
Anion gap: 6 (ref 5–15)
BUN: 29 mg/dL — AB (ref 6–20)
CALCIUM: 8.8 mg/dL — AB (ref 8.9–10.3)
CO2: 24 mmol/L (ref 22–32)
CREATININE: 0.94 mg/dL (ref 0.61–1.24)
Chloride: 107 mmol/L (ref 101–111)
GFR calc non Af Amer: >60 mL/min (ref >60)
Glucose, Bld: 129 mg/dL — ABNORMAL HIGH (ref 65–99)
Potassium: 3.6 mmol/L (ref 3.5–5.1)
SODIUM: 137 mmol/L (ref 135–145)

## 2017-09-17 LAB — GLUCOSE, CAPILLARY
GLUCOSE-CAPILLARY: 219 mg/dL — AB (ref 65–99)
GLUCOSE-CAPILLARY: 110 mg/dL — AB (ref 65–99)
GLUCOSE-CAPILLARY: 131 mg/dL — AB (ref 65–99)
Glucose-Capillary: 167 mg/dL — ABNORMAL HIGH (ref 65–99)
Glucose-Capillary: 128 mg/dL — ABNORMAL HIGH (ref 65–99)

## 2017-09-17 LAB — TROPONIN I
TROPONIN I: 1.06 ng/mL — AB (ref <0.03)
Troponin I: 1.24 ng/mL (ref <0.03)
Troponin I: 0.91 ng/mL (ref <0.03)

## 2017-09-17 LAB — HEPARIN LEVEL (UNFRACTIONATED)
HEPARIN UNFRACTIONATED: 0.57 [IU]/mL (ref 0.30–0.70)
Heparin Unfractionated: 0.38 IU/mL (ref 0.30–0.70)

## 2017-09-17 MED ORDER — ACETAMINOPHEN 325 MG PO TABS: 650.0000 mg | ORAL_TABLET | Freq: Four times a day (QID) | ORAL | Status: DC | PRN

## 2017-09-17 MED ORDER — ONDANSETRON HCL 4 MG PO TABS: 4.0000 mg | ORAL_TABLET | Freq: Four times a day (QID) | ORAL | Status: DC | PRN

## 2017-09-17 MED ORDER — FUROSEMIDE 10 MG/ML IJ SOLN
20.0000 mg | Freq: Two times a day (BID) | INTRAMUSCULAR | Status: DC
  Administered 2017-09-17 (×2): 20 mg via INTRAVENOUS
  Filled 2017-09-17 (×2): qty 2

## 2017-09-17 MED ORDER — CLOPIDOGREL BISULFATE 75 MG PO TABS
75.0000 mg | ORAL_TABLET | Freq: Every day | ORAL | Status: DC
  Administered 2017-09-17 – 2017-09-20 (×3): 75 mg via ORAL
  Filled 2017-09-17 (×3): qty 1

## 2017-09-17 MED ORDER — ASPIRIN EC 81 MG PO TBEC
81.0000 mg | DELAYED_RELEASE_TABLET | Freq: Every day | ORAL | Status: DC
  Administered 2017-09-17 – 2017-09-20 (×3): 81 mg via ORAL
  Filled 2017-09-17 (×3): qty 1

## 2017-09-17 MED ORDER — INSULIN ASPART 100 UNIT/ML ~~LOC~~ SOLN
0.0000 [IU] | Freq: Four times a day (QID) | SUBCUTANEOUS | Status: DC
  Administered 2017-09-17 (×2): 1 [IU] via SUBCUTANEOUS
  Administered 2017-09-17: 2 [IU] via SUBCUTANEOUS
  Filled 2017-09-17 (×3): qty 1

## 2017-09-17 MED ORDER — CARVEDILOL 3.125 MG PO TABS
3.2500 mg | ORAL_TABLET | Freq: Two times a day (BID) | ORAL | Status: DC
  Administered 2017-09-17 – 2017-09-20 (×6): 3.125 mg via ORAL
  Filled 2017-09-17 (×5): qty 1

## 2017-09-17 MED ORDER — INSULIN ASPART 100 UNIT/ML ~~LOC~~ SOLN
0.0000 [IU] | Freq: Three times a day (TID) | SUBCUTANEOUS | Status: DC
  Administered 2017-09-18: 1 [IU] via SUBCUTANEOUS
  Administered 2017-09-18 – 2017-09-19 (×2): 2 [IU] via SUBCUTANEOUS
  Filled 2017-09-17 (×3): qty 1

## 2017-09-17 MED ORDER — ACYCLOVIR 200 MG PO CAPS
400.0000 mg | ORAL_CAPSULE | Freq: Two times a day (BID) | ORAL | Status: DC
  Administered 2017-09-17 – 2017-09-20 (×7): 400 mg via ORAL
  Filled 2017-09-17 (×8): qty 2

## 2017-09-17 MED ORDER — ONDANSETRON HCL 4 MG/2ML IJ SOLN: 4.0000 mg | Freq: Four times a day (QID) | INTRAMUSCULAR | Status: DC | PRN

## 2017-09-17 MED ORDER — SODIUM CHLORIDE 0.9 % IV SOLN: INTRAVENOUS | Status: DC

## 2017-09-17 MED ORDER — OXYCODONE HCL 5 MG PO TABS: 5.0000 mg | ORAL_TABLET | ORAL | Status: DC | PRN

## 2017-09-17 MED ORDER — SODIUM CHLORIDE 0.9% FLUSH
3.0000 mL | Freq: Two times a day (BID) | INTRAVENOUS | Status: DC
  Administered 2017-09-17 – 2017-09-18 (×2): 3 mL via INTRAVENOUS

## 2017-09-17 MED ORDER — ACETAMINOPHEN 650 MG RE SUPP: 650.0000 mg | Freq: Four times a day (QID) | RECTAL | Status: DC | PRN

## 2017-09-17 MED ORDER — ATORVASTATIN CALCIUM 20 MG PO TABS
80.0000 mg | ORAL_TABLET | Freq: Every day | ORAL | Status: DC
  Administered 2017-09-17 – 2017-09-19 (×3): 80 mg via ORAL
  Filled 2017-09-17 (×2): qty 4

## 2017-09-17 MED ORDER — FINASTERIDE 5 MG PO TABS
2.5000 mg | ORAL_TABLET | Freq: Every day | ORAL | Status: DC
  Administered 2017-09-17 – 2017-09-20 (×3): 2.5 mg via ORAL
  Filled 2017-09-17 (×3): qty 1

## 2017-09-17 NOTE — Consult Note (Signed)
[facility] Consultation Note  Patient ID: Dwayne Terry, MRN: [id number], DOB/AGE: [birth year] 83 y.o. Admit date: 09/16/2017   Date of Consult: 09/17/2017 Primary Physician: Kirk Ruths, MD Primary Cardiologist: Nehemiah Massed  Chief Complaint:  Chief Complaint  Patient presents with  . Shortness of Breath  . Chest Pain   Reason for Consult: infarction  HPI: 83 y.o. male ith known diabetes with complication essential hypertension mixed hyperlipidemia and previous myocardial infction as well as aortic valve replacement and coronary artery bypass grafting. The patient did have multiple small myocardial infarction's in the past for which in this year has had significant restenosis of a right coronary artery stenting. The patient had been placed on appropriate medications including dual antiplatelet therapy beta blocker high intensity cholesterol therapy for which she was appeared to be working well. The patient was canned having significant episodes of chest discomfort gas type pressure and pain with and without physical activity as well as nausea. This sick escalated to he point where he had to come into the hospital and there was significant continued EKG changes consistent with normal sinus rhythm with left bundle-branch block. The patient had an elevated troponin of 1.2 and his pain was relieved with heparin and nitroglycerin. Currently he is stable and has no further significant symptoms  Past Medical History:  Diagnosis Date  . Cancer ([place])    bone marrow, prostate cancer  . Diabetes mellitus without complication ([place])   . Hyperlipidemia   . MI (mitral incompetence)   . MI (myocardial infarction) [facility])       Surgical History:  Past Surgical History:  Procedure Laterality Date  . AORTIC VALVE REPLACEMENT (AVR)/CORONARY ARTERY BYPASS GRAFTING (CABG)    . CORONARY ANGIOPLASTY WITH STENT PLACEMENT    . CORONARY BALLOON ANGIOPLASTY N/A 06/10/2017   Procedure:  Coronary Balloon Angioplasty;  Surgeon: Wellington Hampshire, MD;  Location: [place] [facility];  Service: Cardiovascular;  Laterality: N/A;  . CORONARY STENT INTERVENTION N/A 01/28/2017   Procedure: Coronary Stent Intervention;  Surgeon: [name], MD;  Location: [place] [facility];  Service: Cardiovascular;  Laterality: N/A;  . CORONARY STENT INTERVENTION N/A 04/29/2017   Procedure: Coronary Stent Intervention;  Surgeon: [name], MD;  Location: [place] [facility];  Service: Cardiovascular;  Laterality: N/A;  . LEFT HEART CATH AND CORONARY ANGIOGRAPHY N/A 01/28/2017   Procedure: Left Heart Cath and Coronary Angiography;  Surgeon: [name], MD;  Location: [place] [facility];  Service: Cardiovascular;  Laterality: N/A;  . LEFT HEART CATH AND CORS/GRAFTS ANGIOGRAPHY N/A 04/29/2017   Procedure: Left Heart Cath and Cors/Grafts Angiography;  Surgeon: [name], MD;  Location: [place] [facility];  Service: Cardiovascular;  Laterality: N/A;  . LEFT HEART CATH AND CORS/GRAFTS ANGIOGRAPHY N/A 06/10/2017   Procedure: Left Heart Cath and Cors/Grafts Angiography;  Surgeon: Corey Skains, MD;  Location: [place] [facility];  Service: Cardiovascular;  Laterality: N/A;     Home Meds: Prior to Admission medications   Medication Sig Start Date End Date Taking? Authorizing Provider  acetaminophen (TYLENOL) 325 MG tablet Take 650 mg by mouth every 6 (six) hours as needed.   Yes [provider]  acyclovir (ZOVIRAX) 400 MG tablet Take 400 mg by mouth 2 (two) times daily.   Yes [provider]  aspirin EC 81 MG tablet Take 1 tablet (81 mg total) by mouth daily. 01/29/17  Yes Wieting, Richard, MD  atorvastatin (LIPITOR) 80 MG tablet Take 80 mg by  mouth daily at 6 PM.   Yes [provider]  carvedilol (COREG) 6.25 MG tablet Take 1 tablet (6.25 mg total) by mouth 2 (two) times daily with a meal. Patient taking differently: Take 3.25 mg by mouth 2  (two) times daily with a meal.  01/29/17  Yes Loletha Grayer, MD  clopidogrel (PLAVIX) 75 MG tablet Take 1 tablet (75 mg total) by mouth daily with breakfast. 04/30/17  Yes Sainani, Belia Heman, MD  dexamethasone (DECADRON) 2 MG tablet Take 8.5 mg by mouth See admin instructions. Take 8.5 mg on Thursday and Friday.   Yes [provider]  finasteride (PROSCAR) 5 MG tablet Take 2.5 mg by mouth daily.    Yes [provider]  metFORMIN (GLUCOPHAGE) 500 MG tablet Take 500 mg by mouth 2 (two) times daily with a meal.   Yes [provider]  Multiple Vitamin (MULTI-VITAMINS) TABS Take 1 tablet by mouth daily.   Yes [provider]  silver sulfADIAZINE (SILVADENE) 1 % cream Apply to affected area daily Patient not taking: Reported on 09/16/2017 08/03/17 08/03/18  Duanne Guess, PA-C    Inpatient Medications:  . acyclovir  400 mg Oral BID  . aspirin EC  81 mg Oral Daily  . atorvastatin  80 mg Oral q1800  . carvedilol  3.125 mg Oral BID WC  . clopidogrel  75 mg Oral Q breakfast  . finasteride  2.5 mg Oral Daily  . insulin aspart  0-9 Units Subcutaneous Q6H   . heparin 950 Units/hr (09/16/17 2308)    Allergies:  Allergies  Allergen Reactions  . Antihistamines, Chlorpheniramine-Type Other (See Comments)    Reaction: unknown  . Nitroglycerin Other (See Comments)    Reaction: hypotension    Social History   Social History  . Marital status: Married    Spouse name: N/A  . Number of children: N/A  . Years of education: N/A   Occupational History  . Not on file.   Social History Main Topics  . Smoking status: Former Research scientist (life sciences)  . Smokeless tobacco: Former Systems developer  . Alcohol use No  . Drug use: No  . Sexual activity: Not on file   Other Topics Concern  . Not on file   Social History Narrative  . No narrative on file     Family History  Problem Relation Age of Onset  . Heart disease Other      Review of Systems Positive for chest pain Negative  for: General:  chills, fever, night sweats or weight changes.  Cardiovascular: PND orthopnea syncope dizziness  Dermatological skin lesions rashes Respiratory: Cough congestion Urologic: Frequent urination urination at night and hematuria Abdominal: negative for nausea, vomiting, diarrhea, bright red blood per rectum, melena, or hematemesis Neurologic: negative for visual changes, and/or hearing changes  All other systems reviewed and are otherwise negative except as noted above.  Labs:  Recent Labs  09/16/17 1831 09/16/17 2103 09/17/17 0404  TROPONINI 0.15* 0.63* 1.24*   Lab Results  Component Value Date   WBC 11.1 (H) 09/17/2017   HGB 11.6 (L) 09/17/2017   HCT 33.1 (L) 09/17/2017   MCV 92.6 09/17/2017   PLT 76 (L) 09/17/2017    Recent Labs Lab 09/16/17 1831 09/17/17 0404  NA 137 137  K 3.8 3.6  CL 107 107  CO2 20* 24  BUN 27* 29*  CREATININE 1.13 0.94  CALCIUM 9.4 8.8*  PROT 7.1  --   BILITOT 1.0  --   ALKPHOS 60  --  ALT 24  --   AST 29  --   GLUCOSE 187* 129*   Lab Results  Component Value Date   CHOL 110 07/08/2013   HDL 30 (L) 07/08/2013   LDLCALC 60 07/08/2013   TRIG 101 07/08/2013   No results found for: DDIMER  Radiology/Studies:  Dg Chest Port 1 View  Result Date: 09/16/2017 CLINICAL DATA:  Shortness of breath and chest pain EXAM: PORTABLE CHEST 1 VIEW COMPARISON:  Chest radiograph 06/10/2017 FINDINGS: Worsening interstitial opacities compared to the prior examination. Status post CABG. Small right pleural effusion. No pneumothorax. IMPRESSION: Small right pleural effusion and basilar predominant mild interstitial pulmonary edema. Electronically Signed   By: Ulyses Jarred M.D.   On: 09/16/2017 19:01    EKG: normal sinus rhythm with left bundle-branch block  Weights: Filed Weights   09/16/17 1830  Weight: 77.1 kg (170 lb)     Physical Exam: Blood pressure 114/64, pulse 62, temperature 97.7 F (36.5 C), temperature source Oral, resp.  rate 18, height 5\' 8"  (1.727 m), weight 77.1 kg (170 lb), SpO2 96 %. Body mass index is 25.85 kg/m. General: Well developed, well nourished, in no acute distress. Head eyes ears nose throat: Normocephalic, atraumatic, sclera non-icteric, no xanthomas, nares are without discharge. No apparent thyromegaly and/or mass  Lungs: Normal respiratory effort.  no wheezes, no rales, no rhonchi.  Heart: RRR with normal S1 S2.2+ aortic murmur gallop, no rub, PMI is normal size and placement, carotid upstroke normal without bruit, jugular venous pressure is normal Abdomen: Soft, non-tender, non-distended with normoactive bowel sounds. No hepatomegaly. No rebound/guarding. No obvious abdominal masses. Abdominal aorta is normal size without bruit Extremities: No edema. no cyanosis, no clubbing, no ulcers  Peripheral : 2+ bilateral upper extremity pulses, 2+ bilateral femoral pulses, 2+ bilateral dorsal pedal pulse Neuro: Alert and oriented. No facial asymmetry. No focal deficit. Moves all extremities spontaneously. Musculoskeletal: Normal muscle tone without kyphosis Psych:  Responds to questions appropriately with a normal affect.    Assessment: 83 year old male with aortic valve disease status post aortic valve replacement coronary artery bypass graft with previous myocardial infarction essential hypertension makes hyperlipidemia and diabetes with complication having a non-ST elevation myocardial infarction  Plan: 1. Continue heparin and nitrates for myocardial infarction and chest  2. Continue high intensity chlesterol therapy 3. Beta blocker aient tolerates 4. Proceed to cardiac ceterization to assess coronary anatomy and further treatment thereof is necessary. Patient understands d benefits cardiac catheteon. This inclu  Signed, Corey Skains M.D. [facility] 09/17/2017, 6:29 AM

## 2017-09-17 NOTE — Progress Notes (Signed)
ANTICOAGULATION CONSULT NOTE - Follow up [place] for Heparin drip Indication: chest pain/ACS  Allergies  Allergen Reactions  . Antihistamines, Chlorpheniramine-Type Other (See Comments)    Reaction: unknown  . Nitroglycerin Other (See Comments)    Reaction: hypotension    Patient Measurements: Height: 5\' 8"  (172.7 cm) Weight: 170 lb (77.1 kg) IBW/kg (Calculated) : 68.4 Heparin Dosing Weight: 77.1 kg  Vital Signs: Temp: 97.8 F (36.6 C) (10/20 1312) Temp Source: Oral (10/20 1312) BP: 103/57 (10/20 1312) Pulse Rate: 64 (10/20 1312)  Labs:  Recent Labs  09/16/17 1831  09/16/17 2200 09/17/17 0404 09/17/17 0907 09/17/17 1455 09/17/17 1654  HGB 12.7*  --   --  11.6*  --   --   --   HCT 37.1*  --   --  33.1*  --   --   --   PLT 79*  --   --  76*  --   --   --   APTT  --   --  24  --   --   --   --   LABPROT  --   --  14.2  --   --   --   --   INR  --   --  1.11  --   --   --   --   HEPARINUNFRC  --   --   --   --  0.57  --  0.38  CREATININE 1.13  --   --  0.94  --   --   --   TROPONINI 0.15*  < >  --  1.24* 1.06* 0.91*  --   < > = values in this interval not displayed.  Estimated Creatinine Clearance: 55.6 mL/min (by C-G formula based on SCr of 0.94 mg/dL).     Assessment: 83 yo male starting on heparin. No oral anticoagulants noted on PTA med list.  Plt count noted to be 6 - discussed with ordering ED MD who stated not to bolus heparin.    Goal of Therapy:  Heparin level 0.3-0.7 units/ml Monitor platelets by anticoagulation protocol: Yes   Plan:  STAT aPTT and INR ordered No bolus Start heparin drip at 950 units/hr (=9.5 ml/hr) Heparin level in 8h CBC in AM    10/20 @ 09:07   HL  = 0.57. Continue current drip rate.  Next HL ordered for tonight at 17:00.   10/20 @1654  HL=0.38. Level is still therapeutic. Continue current drip rate of 950units/hr. Recheck HL and CBC with AM labs.   Pernell Dupre, PharmD, BCPS Clinical  Pharmacist 09/17/2017 6:07 PM

## 2017-09-17 NOTE — Progress Notes (Signed)
[facility]                                                                                                                                                                                  Patient Demographics   Dwayne Terry, is a 83 y.o. male, DOB - [birth year], ZOX:[id number]  Admit date - 09/16/2017   Admitting Physician Lance Coon, MD  Outpatient Primary MD for the patient is Kirk Ruths, MD   LOS - 1  Subjective: Patient admitted with non-ST MI. He states the chest pain is now gone. Shortness of breath improved    Review of Systems:   CONSTITUTIONAL: No documented fever. No fatigue, weakness. No weight gain, no weight loss.  EYES: No blurry or double vision.  ENT: No tinnitus. No postnasal drip. No redness of the oropharynx.  RESPIRATORY: No cough, no wheeze, no hemoptysis. No dyspnea.  CARDIOVASCULAR: No chest pain. No orthopnea. No palpitations. No syncope.  GASTROINTESTINAL: No nausea, no vomiting or diarrhea. No abdominal pain. No melena or hematochezia.  GENITOURINARY: No dysuria or hematuria.  ENDOCRINE: No polyuria or nocturia. No heat or cold intolerance.  HEMATOLOGY: No anemia. No bruising. No bleeding.  INTEGUMENTARY: No rashes. No lesions.  MUSCULOSKELETAL: No arthritis. No swelling. No gout.  NEUROLOGIC: No numbness, tingling, or ataxia. No seizure-type activity.  PSYCHIATRIC: No anxiety. No insomnia. No ADD.    Vitals:   Vitals:   09/17/17 0059 09/17/17 0500 09/17/17 0800 09/17/17 1312  BP: 140/71 114/64 124/67 (!) 103/57  Pulse: 63 62 62 64  Resp: 18 18 16    Temp: 97.6 F (36.4 C) 97.7 F (36.5 C) (!) 97.3 F (36.3 C) 97.8 F (36.6 C)  TempSrc: Oral Oral Oral Oral  SpO2: 98% 96% 98% 97%  Weight:      Height:        Wt Readings from Last 3 Encounters:  09/16/17 170 lb (77.1 kg)  08/03/17 167 lb (75.8 kg)  06/11/17 165 lb 11.2 oz (75.2 kg)     Intake/Output Summary (Last 24 hours) at 09/17/17  1646 Last data filed at 09/17/17 1312  Gross per 24 hour  Intake           554.73 ml  Output             3275 ml  Net         -2720.27 ml    Physical Exam:   GENERAL: Pleasant-appearing in no apparent distress.  HEAD, EYES, EARS, NOSE AND THROAT: Atraumatic, normocephalic. Extraocular muscles are intact. Pupils equal and reactive to light. Sclerae anicteric. No conjunctival injection. No oro-pharyngeal erythema.  NECK: Supple. There is  no jugular venous distention. No bruits, no lymphadenopathy, no thyromegaly.  HEART: Regular rate and rhythm,. No murmurs, no rubs, no clicks.  LUNGS: Clear to auscultation bilaterally. No rales or rhonchi. No wheezes.  ABDOMEN: Soft, flat, nontender, nondistended. Has good bowel sounds. No hepatosplenomegaly appreciated.  EXTREMITIES: No evidence of any cyanosis, clubbing, or peripheral edema.  +2 pedal and radial pulses bilaterally.  NEUROLOGIC: The patient is alert, awake, and oriented x3 with no focal motor or sensory deficits appreciated bilaterally.  SKIN: Moist and warm with no rashes appreciated.  Psych: Not anxious, depressed LN: No inguinal LN enlargement    Antibiotics   Anti-infectives    Start     Dose/Rate Route Frequency Ordered Stop   09/17/17 0058  acyclovir (ZOVIRAX) 200 MG capsule 400 mg     400 mg Oral 2 times daily 09/17/17 0058        Medications   Scheduled Meds: . acyclovir  400 mg Oral BID  . aspirin EC  81 mg Oral Daily  . atorvastatin  80 mg Oral q1800  . carvedilol  3.125 mg Oral BID WC  . clopidogrel  75 mg Oral Q breakfast  . finasteride  2.5 mg Oral Daily  . furosemide  20 mg Intravenous Q12H  . insulin aspart  0-9 Units Subcutaneous Q6H   Continuous Infusions: . heparin 950 Units/hr (09/16/17 2308)   PRN Meds:.acetaminophen **OR** acetaminophen, ondansetron **OR** ondansetron (ZOFRAN) IV, oxyCODONE   Data Review:   Micro Results No results found for this or any previous visit (from the past 240  hour(s)).  Radiology Reports Dg Chest Port 1 View  Result Date: 09/16/2017 CLINICAL DATA:  Shortness of breath and chest pain EXAM: PORTABLE CHEST 1 VIEW COMPARISON:  Chest radiograph 06/10/2017 FINDINGS: Worsening interstitial opacities compared to the prior examination. Status post CABG. Small right pleural effusion. No pneumothorax. IMPRESSION: Small right pleural effusion and basilar predominant mild interstitial pulmonary edema. Electronically Signed   By: Ulyses Jarred M.D.   On: 09/16/2017 19:01     CBC  Recent Labs Lab 09/16/17 1831 09/17/17 0404  WBC 12.8* 11.1*  HGB 12.7* 11.6*  HCT 37.1* 33.1*  PLT 79* 76*  MCV 93.7 92.6  MCH 32.1 32.5  MCHC 34.2 35.1  RDW 17.3* 17.2*  LYMPHSABS 0.8*  --   MONOABS 0.6  --   EOSABS 0.0  --   BASOSABS 0.0  --     Chemistries   Recent Labs Lab 09/16/17 1831 09/17/17 0404  NA 137 137  K 3.8 3.6  CL 107 107  CO2 20* 24  GLUCOSE 187* 129*  BUN 27* 29*  CREATININE 1.13 0.94  CALCIUM 9.4 8.8*  AST 29  --   ALT 24  --   ALKPHOS 60  --   BILITOT 1.0  --    ------------------------------------------------------------------------------------------------------------------ estimated creatinine clearance is 55.6 mL/min (by C-G formula based on SCr of 0.94 mg/dL). ------------------------------------------------------------------------------------------------------------------ No results for input(s): HGBA1C in the last 72 hours. ------------------------------------------------------------------------------------------------------------------ No results for input(s): CHOL, HDL, LDLCALC, TRIG, CHOLHDL, LDLDIRECT in the last 72 hours. ------------------------------------------------------------------------------------------------------------------ No results for input(s): TSH, T4TOTAL, T3FREE, THYROIDAB in the last 72 hours.  Invalid input(s):  FREET3 ------------------------------------------------------------------------------------------------------------------ No results for input(s): VITAMINB12, FOLATE, FERRITIN, TIBC, IRON, RETICCTPCT in the last 72 hours.  Coagulation profile  Recent Labs Lab 09/16/17 2200  INR 1.11    No results for input(s): DDIMER in the last 72 hours.  Cardiac Enzymes  Recent Labs Lab 09/17/17 0404 09/17/17 3976  09/17/17 1455  TROPONINI 1.24* 1.06* 0.91*   ------------------------------------------------------------------------------------------------------------------ Invalid input(s): POCBNP    Assessment & Plan  Patient is a 83 year old with known history of coronary artery disease  1.  NSTEMI (non-ST elevated myocardial infarction) (HCC) - IV heparin, cycle cardiac enzymes,  Cardiology seen patient, cath on monday 2.   Diabetes ([place]) - sliding scale insulin corresponding glucose checks, continue metformin, Call tomorrow prior to 3.   HLD (hyperlipidemia) - continue atorvastatin 4.  bPH continue Proscar 5. Acute systolic CHF will give him low-dose IV Lasix      Code Status Orders        Start     Ordered   09/17/17 0059  Full code  Continuous     09/17/17 0058    Code Status History    Date Active Date Inactive Code Status Order ID Comments User Context   06/10/2017  3:24 AM 06/11/2017  4:13 PM Full Code [id number]  Harrie Foreman, MD Inpatient   04/29/2017  5:12 AM 04/30/2017  2:37 PM Full Code [id number]  Harrie Foreman, MD Inpatient   01/27/2017 11:56 PM 01/29/2017  2:30 PM Full Code [id number]  Hugelmeyer, Ubaldo Glassing, DO Inpatient    Advance Directive Documentation     Most Recent Value  Type of Advance Directive  Living will  Pre-existing out of facility DNR order (yellow form or pink MOST form)  -  "MOST" Form in Place?  -           Consults  Cardiology DVT Prophylaxis  Heparin monitor with thrombocytopenia  Lab Results  Component Value Date   PLT 76 (L)  09/17/2017     Time Spent in minutes   35 minutes Greater than 50% of time spent in care coordination and counseling patient regarding the condition and plan of care.   Dustin Flock M.D on 09/17/2017 at 4:46 PM  Between 7am to 6pm - Pager - [phone]  After 6pm go to [email or web address] - password [email or web address] [facility]   Office  [phone]

## 2017-09-17 NOTE — Progress Notes (Signed)
ANTICOAGULATION CONSULT NOTE - Follow up [place] for Heparin drip Indication: chest pain/ACS  Allergies  Allergen Reactions  . Antihistamines, Chlorpheniramine-Type Other (See Comments)    Reaction: unknown  . Nitroglycerin Other (See Comments)    Reaction: hypotension    Patient Measurements: Height: 5\' 8"  (172.7 cm) Weight: 170 lb (77.1 kg) IBW/kg (Calculated) : 68.4 Heparin Dosing Weight: 77.1 kg  Vital Signs: Temp: 97.3 F (36.3 C) (10/20 0800) Temp Source: Oral (10/20 0800) BP: 124/67 (10/20 0800) Pulse Rate: 62 (10/20 0800)  Labs:  Recent Labs  09/16/17 1831 09/16/17 2103 09/16/17 2200 09/17/17 0404 09/17/17 0907  HGB 12.7*  --   --  11.6*  --   HCT 37.1*  --   --  33.1*  --   PLT 79*  --   --  76*  --   APTT  --   --  24  --   --   LABPROT  --   --  14.2  --   --   INR  --   --  1.11  --   --   HEPARINUNFRC  --   --   --   --  0.57  CREATININE 1.13  --   --  0.94  --   TROPONINI 0.15* 0.63*  --  1.24*  --     Estimated Creatinine Clearance: 55.6 mL/min (by C-G formula based on SCr of 0.94 mg/dL).     Assessment: 83 yo male starting on heparin. No oral anticoagulants noted on PTA med list.  Plt count noted to be 65 - discussed with ordering ED MD who stated not to bolus heparin.    Goal of Therapy:  Heparin level 0.3-0.7 units/ml Monitor platelets by anticoagulation protocol: Yes   Plan:  STAT aPTT and INR ordered No bolus Start heparin drip at 950 units/hr (=9.5 ml/hr) Heparin level in 8h CBC in AM    10/20 @ 09:07   HL  = 0.57. Continue current drip rate.  Next HL ordered for tonight at 17:00.   Gizell Danser K, RPH 09/17/2017,10:16 AM

## 2017-09-17 NOTE — Progress Notes (Signed)
Inquired about iv fluid order to Dr.Willis. Order discontinued.

## 2017-09-18 ENCOUNTER — Inpatient Hospital Stay
Admit: 2017-09-18 | Discharge: 2017-09-18 | Disposition: A | Discharge status: Home / Self Care | Payer: Medicare HMO | Attending: Internal Medicine | Admitting: Internal Medicine

## 2017-09-18 LAB — BASIC METABOLIC PANEL
Anion gap: 9 (ref 5–15)
BUN: 31 mg/dL — ABNORMAL HIGH (ref 6–20)
CALCIUM: 9 mg/dL (ref 8.9–10.3)
CHLORIDE: 104 mmol/L (ref 101–111)
CO2: 24 mmol/L (ref 22–32)
Creatinine, Ser: 1.07 mg/dL (ref 0.61–1.24)
GFR calc non Af Amer: >60 mL/min (ref >60)
Glucose, Bld: 105 mg/dL — ABNORMAL HIGH (ref 65–99)
POTASSIUM: 3.2 mmol/L — AB (ref 3.5–5.1)
Sodium: 137 mmol/L (ref 135–145)

## 2017-09-18 LAB — CBC
HCT: 33.6 % — ABNORMAL LOW (ref 40.0–52.0)
HEMOGLOBIN: 11.3 g/dL — AB (ref 13.0–18.0)
MCH: 31.6 pg (ref 26.0–34.0)
MCHC: 33.8 g/dL (ref 32.0–36.0)
MCV: 93.4 fL (ref 80.0–100.0)
Platelets: 58 10*3/uL — ABNORMAL LOW (ref 150–440)
RBC: 3.59 MIL/uL — ABNORMAL LOW (ref 4.40–5.90)
RDW: 17.2 % — ABNORMAL HIGH (ref 11.5–14.5)
WBC: 9.7 10*3/uL (ref 3.8–10.6)

## 2017-09-18 LAB — GLUCOSE, CAPILLARY
GLUCOSE-CAPILLARY: 96 mg/dL (ref 65–99)
Glucose-Capillary: 164 mg/dL — ABNORMAL HIGH (ref 65–99)
Glucose-Capillary: 101 mg/dL — ABNORMAL HIGH (ref 65–99)
Glucose-Capillary: 132 mg/dL — ABNORMAL HIGH (ref 65–99)

## 2017-09-18 LAB — HEPARIN LEVEL (UNFRACTIONATED): Heparin Unfractionated: 0.45 IU/mL (ref 0.30–0.70)

## 2017-09-18 LAB — ECHOCARDIOGRAM COMPLETE
Height: 68 in
Weight: 2540.8 oz

## 2017-09-18 MED ORDER — FUROSEMIDE 40 MG PO TABS
40.0000 mg | ORAL_TABLET | Freq: Two times a day (BID) | ORAL | Status: DC
  Administered 2017-09-18 – 2017-09-20 (×4): 40 mg via ORAL
  Filled 2017-09-18 (×4): qty 1

## 2017-09-18 MED ORDER — POTASSIUM CHLORIDE CRYS ER 20 MEQ PO TBCR
40.0000 meq | EXTENDED_RELEASE_TABLET | Freq: Once | ORAL | Status: AC
Start: 2017-09-18 — End: 2017-09-18
  Administered 2017-09-18: 40 meq via ORAL
  Filled 2017-09-18: qty 2

## 2017-09-18 NOTE — Progress Notes (Signed)
[facility] Cardiology [place] Encounter Note  Patient: Dwayne Terry / Admit Date: 09/16/2017 / Date of Encounter: 09/18/2017, 3:00 PM   Subjective: No further episodes of chest discomfort. Patient resting comfortably. Patient on appropriate medication management.  Review of Systems: Positive for: None Negative for: Vision change, hearing change, syncope, dizziness, nausea, vomiting,diarrhea, bloody stool, stomach pain, cough, congestion, diaphoresis, urinary frequency, urinary pain,skin lesions, skin rashes Others previously listed  Objective: Telemetry: Normal sinus rhythm Physical Exam: Blood pressure (!) 129/55, pulse 60, temperature 98.3 F (36.8 C), temperature source Oral, resp. rate 16, height 5\' 8"  (1.727 m), weight 72 kg (158 lb 12.8 oz), SpO2 94 %. Body mass index is 24.15 kg/m. General: Well developed, well nourished, in no acute distress. Head: Normocephalic, atraumatic, sclera non-icteric, no xanthomas, nares are without discharge. Neck: No apparent masses Lungs: Normal respirations with no wheezes, no rhonchi, no rales , no crackles   Heart: Regular rate and rhythm, normal S1 S2, no murmur, no rub, no gallop, PMI is normal size and placement, carotid upstroke normal without bruit, jugular venous pressure normal Abdomen: Soft, non-tender, non-distended with normoactive bowel sounds. No hepatosplenomegaly. Abdominal aorta is normal size without bruit Extremities: No edema, no clubbing, no cyanosis, no ulcers,  Peripheral: 2+ radial, 2+ femoral, 2+ dorsal pedal pulses Neuro: Alert and oriented. Moves all extremities spontaneously. Psych:  Responds to questions appropriately with a normal affect.   Intake/Output Summary (Last 24 hours) at 09/18/17 1500 Last data filed at 09/18/17 1405  Gross per 24 hour  Intake           593.68 ml  Output             2385 ml  Net         -1791.32 ml    Inpatient Medications:  . acyclovir  400 mg Oral BID  . aspirin EC  81 mg  Oral Daily  . atorvastatin  80 mg Oral q1800  . carvedilol  3.125 mg Oral BID WC  . clopidogrel  75 mg Oral Q breakfast  . finasteride  2.5 mg Oral Daily  . furosemide  40 mg Oral BID  . insulin aspart  0-9 Units Subcutaneous TID AC & HS  . sodium chloride flush  3 mL Intravenous Q12H   Infusions:  . heparin 950 Units/hr (09/17/17 2153)    Labs:  Recent Labs  09/17/17 0404 09/18/17 0448  NA 137 137  K 3.6 3.2*  CL 107 104  CO2 24 24  GLUCOSE 129* 105*  BUN 29* 31*  CREATININE 0.94 1.07  CALCIUM 8.8* 9.0    Recent Labs  09/16/17 1831  AST 29  ALT 24  ALKPHOS 60  BILITOT 1.0  PROT 7.1  ALBUMIN 3.6    Recent Labs  09/16/17 1831 09/17/17 0404 09/18/17 0448  WBC 12.8* 11.1* 9.7  NEUTROABS 11.4*  --   --   HGB 12.7* 11.6* 11.3*  HCT 37.1* 33.1* 33.6*  MCV 93.7 92.6 93.4  PLT 79* 76* 58*    Recent Labs  09/16/17 2103 09/17/17 0404 09/17/17 0907 09/17/17 1455  TROPONINI 0.63* 1.24* 1.06* 0.91*   Invalid input(s): POCBNP No results for input(s): HGBA1C in the last 72 hours.   Weights: Filed Weights   09/16/17 1830 09/18/17 0500  Weight: 77.1 kg (170 lb) 72 kg (158 lb 12.8 oz)     Radiology/Studies:  Dg Chest Port 1 View  Result Date: 09/16/2017 CLINICAL DATA:  Shortness of breath and chest pain EXAM:  PORTABLE CHEST 1 VIEW COMPARISON:  Chest radiograph 06/10/2017 FINDINGS: Worsening interstitial opacities compared to the prior examination. Status post CABG. Small right pleural effusion. No pneumothorax. IMPRESSION: Small right pleural effusion and basilar predominant mild interstitial pulmonary edema. Electronically Signed   By: Ulyses Jarred M.D.   On: 09/16/2017 19:01     Assessment and Recommendation  83 y.o. male with known coronary artery disease status post coronary bypass graft in the remote past and PCI and stent placement in the past with intervention earlier this year now with probable restenosis and non-ST elevation myocardial  infarction 1. Continue heparin for further risk reduction of myocardial infarction 2. Plavix and aspirin for further risk reduction of myocardial infarction in-stent thrombosis 3. No change in furosemide for chronic lower extremity edema 4. High intensity cholesterol therapy with atorvastatin 5. Proceed to cardiac catheterization to assess coronary anatomy and further trimmed thereof is necessary. Patient understands risk and benefits cardiac catheterization. Patient understands this includes a possibility of death stroke heart attack infection bleeding or blood clot. The patient is at low risk for conscious sedation  Signed, Serafina Royals M.D. FACC

## 2017-09-18 NOTE — Progress Notes (Signed)
*  PRELIMINARY RESULTS* Echocardiogram 2D Echocardiogram has been performed.  Dwayne Terry 09/18/2017, 8:44 AM

## 2017-09-18 NOTE — Progress Notes (Addendum)
[place] at [facility]                                                                                                                                                                                  Patient Demographics   Dwayne Terry, is a 83 y.o. male, DOB - 04/17/1930, HQI:[id number]  Admit date - 09/16/2017   Admitting Physician Lance Coon, MD  Outpatient Primary MD for the patient is Kirk Ruths, MD   LOS - 2  Subjective: Patient admitted with non-ST MI.  Patient was able to diuresis well Currently denies any chest pain or shortness of breath     Review of Systems:   CONSTITUTIONAL: No documented fever. No fatigue, weakness. No weight gain, no weight loss.  EYES: No blurry or double vision.  ENT: No tinnitus. No postnasal drip. No redness of the oropharynx.  RESPIRATORY: No cough, no wheeze, no hemoptysis. No dyspnea.  CARDIOVASCULAR: No chest pain. No orthopnea. No palpitations. No syncope.  GASTROINTESTINAL: No nausea, no vomiting or diarrhea. No abdominal pain. No melena or hematochezia.  GENITOURINARY: No dysuria or hematuria.  ENDOCRINE: No polyuria or nocturia. No heat or cold intolerance.  HEMATOLOGY: No anemia. No bruising. No bleeding.  INTEGUMENTARY: No rashes. No lesions.  MUSCULOSKELETAL: No arthritis. No swelling. No gout.  NEUROLOGIC: No numbness, tingling, or ataxia. No seizure-type activity.  PSYCHIATRIC: No anxiety. No insomnia. No ADD.    Vitals:   Vitals:   09/17/17 2010 09/18/17 0455 09/18/17 0500 09/18/17 0900  BP: 136/66 118/62  (!) 129/55  Pulse: 65 65  60  Resp: 18   16  Temp: 98.3 F (36.8 C)   98.3 F (36.8 C)  TempSrc: Oral   Oral  SpO2: 97% 95%  94%  Weight:   158 lb 12.8 oz (72 kg)   Height:        Wt Readings from Last 3 Encounters:  09/18/17 158 lb 12.8 oz (72 kg)  08/03/17 167 lb (75.8 kg)  06/11/17 165 lb 11.2 oz (75.2 kg)     Intake/Output Summary (Last 24 hours) at 09/18/17  1458 Last data filed at 09/18/17 1405  Gross per 24 hour  Intake           593.68 ml  Output             2385 ml  Net         -1791.32 ml    Physical Exam:   GENERAL: Pleasant-appearing in no apparent distress.  HEAD, EYES, EARS, NOSE AND THROAT: Atraumatic, normocephalic. Extraocular muscles are intact. Pupils equal and reactive to light. Sclerae anicteric. No conjunctival injection. No oro-pharyngeal erythema.  NECK: Supple. There is no jugular venous distention. No bruits, no lymphadenopathy, no thyromegaly.  HEART: Regular rate and rhythm,. No murmurs, no rubs, no clicks.  LUNGS: Clear to auscultation bilaterally. No rales or rhonchi. No wheezes.  ABDOMEN: Soft, flat, nontender, nondistended. Has good bowel sounds. No hepatosplenomegaly appreciated.  EXTREMITIES: No evidence of any cyanosis, clubbing, or peripheral edema.  +2 pedal and radial pulses bilaterally.  NEUROLOGIC: The patient is alert, awake, and oriented x3 with no focal motor or sensory deficits appreciated bilaterally.  SKIN: Moist and warm with no rashes appreciated.  Psych: Not anxious, depressed LN: No inguinal LN enlargement    Antibiotics   Anti-infectives    Start     Dose/Rate Route Frequency Ordered Stop   09/17/17 0058  acyclovir (ZOVIRAX) 200 MG capsule 400 mg     400 mg Oral 2 times daily 09/17/17 0058        Medications   Scheduled Meds: . acyclovir  400 mg Oral BID  . aspirin EC  81 mg Oral Daily  . atorvastatin  80 mg Oral q1800  . carvedilol  3.125 mg Oral BID WC  . clopidogrel  75 mg Oral Q breakfast  . finasteride  2.5 mg Oral Daily  . furosemide  40 mg Oral BID  . insulin aspart  0-9 Units Subcutaneous TID AC & HS  . sodium chloride flush  3 mL Intravenous Q12H   Continuous Infusions: . heparin 950 Units/hr (09/17/17 2153)   PRN Meds:.acetaminophen **OR** acetaminophen, ondansetron **OR** ondansetron (ZOFRAN) IV, oxyCODONE   Data Review:   Micro Results No results found for  this or any previous visit (from the past 240 hour(s)).  Radiology Reports Dg Chest Port 1 View  Result Date: 09/16/2017 CLINICAL DATA:  Shortness of breath and chest pain EXAM: PORTABLE CHEST 1 VIEW COMPARISON:  Chest radiograph 06/10/2017 FINDINGS: Worsening interstitial opacities compared to the prior examination. Status post CABG. Small right pleural effusion. No pneumothorax. IMPRESSION: Small right pleural effusion and basilar predominant mild interstitial pulmonary edema. Electronically Signed   By: Ulyses Jarred M.D.   On: 09/16/2017 19:01     CBC  Recent Labs Lab 09/16/17 1831 09/17/17 0404 09/18/17 0448  WBC 12.8* 11.1* 9.7  HGB 12.7* 11.6* 11.3*  HCT 37.1* 33.1* 33.6*  PLT 79* 76* 58*  MCV 93.7 92.6 93.4  MCH 32.1 32.5 31.6  MCHC 34.2 35.1 33.8  RDW 17.3* 17.2* 17.2*  LYMPHSABS 0.8*  --   --   MONOABS 0.6  --   --   EOSABS 0.0  --   --   BASOSABS 0.0  --   --     Chemistries   Recent Labs Lab 09/16/17 1831 09/17/17 0404 09/18/17 0448  NA 137 137 137  K 3.8 3.6 3.2*  CL 107 107 104  CO2 20* 24 24  GLUCOSE 187* 129* 105*  BUN 27* 29* 31*  CREATININE 1.13 0.94 1.07  CALCIUM 9.4 8.8* 9.0  AST 29  --   --   ALT 24  --   --   ALKPHOS 60  --   --   BILITOT 1.0  --   --    ------------------------------------------------------------------------------------------------------------------ estimated creatinine clearance is 48.8 mL/min (by C-G formula based on SCr of 1.07 mg/dL). ------------------------------------------------------------------------------------------------------------------ No results for input(s): HGBA1C in the last 72 hours. ------------------------------------------------------------------------------------------------------------------ No results for input(s): CHOL, HDL, LDLCALC, TRIG, CHOLHDL, LDLDIRECT in the last 72  hours. ------------------------------------------------------------------------------------------------------------------ No results for input(s): TSH, T4TOTAL, T3FREE, THYROIDAB  in the last 72 hours.  Invalid input(s): FREET3 ------------------------------------------------------------------------------------------------------------------ No results for input(s): VITAMINB12, FOLATE, FERRITIN, TIBC, IRON, RETICCTPCT in the last 72 hours.  Coagulation profile  Recent Labs Lab 09/16/17 2200  INR 1.11    No results for input(s): DDIMER in the last 72 hours.  Cardiac Enzymes  Recent Labs Lab 09/17/17 0404 09/17/17 0907 09/17/17 1455  TROPONINI 1.24* 1.06* 0.91*   ------------------------------------------------------------------------------------------------------------------ Invalid input(s): POCBNP    Assessment & Plan  Patient is a 83 year old with known history of coronary artery disease  1.  NSTEMI (non-ST elevated myocardial infarction) ([place]) -continue IV heparin, cycle cardiac enzymes,  Cardiology seen patient, cath on Monday, heart: Currently pending 2.   Diabetes ([place]) - sliding scale insulin corresponding glucose checks, discontinue metformin for cath 3.   HLD (hyperlipidemia) - continue atorvastatin 4.  bPH continue Proscar 5. Acute systolic CHF  Improved with iv lasix 6. Chronic thrombocytopenia worse with heparin therapy continue to monitor from May need to stop     Code Status Orders        Start     Ordered   09/17/17 0059  Full code  Continuous     09/17/17 0058    Code Status History    Date Active Date Inactive Code Status Order ID Comments User Context   06/10/2017  3:24 AM 06/11/2017  4:13 PM Full Code [id number]  Harrie Foreman, MD Inpatient   04/29/2017  5:12 AM 04/30/2017  2:37 PM Full Code [id number]  Harrie Foreman, MD Inpatient   01/27/2017 11:56 PM 01/29/2017  2:30 PM Full Code [id number]  Hugelmeyer, Ubaldo Glassing, DO Inpatient    Advance  Directive Documentation     Most Recent Value  Type of Advance Directive  Living will  Pre-existing out of facility DNR order (yellow form or pink MOST form)  -  "MOST" Form in Place?  -           Consults  Cardiology DVT Prophylaxis  Heparin monitor with thrombocytopenia  Lab Results  Component Value Date   PLT 58 (L) 09/18/2017     Time Spent in minutes   32 minutes Greater than 50% of time spent in care coordination and counseling patient regarding the condition and plan of care.   Dustin Flock M.D on 09/18/2017 at 2:58 PM  Between 7am to 6pm - Pager - [phone]  After 6pm go to [email or web address] - password [email or web address] [facility]   Office  [phone]

## 2017-09-18 NOTE — Progress Notes (Signed)
ANTICOAGULATION CONSULT NOTE - Follow up [place] for Heparin drip Indication: chest pain/ACS  Allergies  Allergen Reactions  . Antihistamines, Chlorpheniramine-Type Other (See Comments)    Reaction: unknown  . Nitroglycerin Other (See Comments)    Reaction: hypotension    Patient Measurements: Height: 5\' 8"  (172.7 cm) Weight: 158 lb 12.8 oz (72 kg) IBW/kg (Calculated) : 68.4 Heparin Dosing Weight: 77.1 kg  Vital Signs: Temp: 98.3 F (36.8 C) (10/20 2010) Temp Source: Oral (10/20 2010) BP: 118/62 (10/21 0455) Pulse Rate: 65 (10/21 0455)  Labs:  Recent Labs  09/16/17 1831  09/16/17 2200 09/17/17 0404 09/17/17 0907 09/17/17 1455 09/17/17 1654 09/18/17 0448  HGB 12.7*  --   --  11.6*  --   --   --  11.3*  HCT 37.1*  --   --  33.1*  --   --   --  33.6*  PLT 79*  --   --  76*  --   --   --  58*  APTT  --   --  24  --   --   --   --   --   LABPROT  --   --  14.2  --   --   --   --   --   INR  --   --  1.11  --   --   --   --   --   HEPARINUNFRC  --   --   --   --  0.57  --  0.38 0.45  CREATININE 1.13  --   --  0.94  --   --   --  1.07  TROPONINI 0.15*  < >  --  1.24* 1.06* 0.91*  --   --   < > = values in this interval not displayed.  Estimated Creatinine Clearance: 48.8 mL/min (by C-G formula based on SCr of 1.07 mg/dL).     Assessment: 83 yo male starting on heparin. No oral anticoagulants noted on PTA med list.  Plt count noted to be 75 - discussed with ordering ED MD who stated not to bolus heparin.    Goal of Therapy:  Heparin level 0.3-0.7 units/ml Monitor platelets by anticoagulation protocol: Yes   Plan:  STAT aPTT and INR ordered No bolus Start heparin drip at 950 units/hr (=9.5 ml/hr) Heparin level in 8h CBC in AM    10/20 @ 09:07   HL  = 0.57. Continue current drip rate.  Next HL ordered for tonight at 17:00.   10/20 @1654  HL=0.38. Level is still therapeutic. Continue current drip rate of 950units/hr. Recheck HL and CBC  with AM labs.   10/21 @ 0448 = 0.45.  Will continue this pt at current rate and recheck HL on 10/22 @ 0500.   Orene Desanctis, PharmD Clinical Pharmacist 09/18/2017 6:08 AM

## 2017-09-19 ENCOUNTER — Encounter
Admission: EM | Disposition: A | Discharge status: Home / Self Care | Payer: Self-pay | Source: Home / Self Care | Attending: Internal Medicine

## 2017-09-19 ENCOUNTER — Encounter: Payer: Self-pay | Admitting: Internal Medicine

## 2017-09-19 DIAGNOSIS — I214 Non-ST elevation (NSTEMI) myocardial infarction: Principal | ICD-10-CM

## 2017-09-19 HISTORY — PX: LEFT HEART CATH AND CORS/GRAFTS ANGIOGRAPHY: CATH118250

## 2017-09-19 HISTORY — PX: CORONARY STENT INTERVENTION: CATH118234

## 2017-09-19 LAB — GLUCOSE, CAPILLARY
Glucose-Capillary: 115 mg/dL — ABNORMAL HIGH (ref 65–99)
Glucose-Capillary: 171 mg/dL — ABNORMAL HIGH (ref 65–99)

## 2017-09-19 LAB — CBC
HCT: 36.8 % — ABNORMAL LOW (ref 40.0–52.0)
Hemoglobin: 12.7 g/dL — ABNORMAL LOW (ref 13.0–18.0)
MCH: 32.3 pg (ref 26.0–34.0)
MCHC: 34.6 g/dL (ref 32.0–36.0)
MCV: 93.5 fL (ref 80.0–100.0)
PLATELETS: 57 10*3/uL — AB (ref 150–440)
RBC: 3.94 MIL/uL — ABNORMAL LOW (ref 4.40–5.90)
RDW: 17.2 % — AB (ref 11.5–14.5)
WBC: 10.2 10*3/uL (ref 3.8–10.6)

## 2017-09-19 LAB — POCT ACTIVATED CLOTTING TIME: Activated Clotting Time: 467 seconds

## 2017-09-19 LAB — BASIC METABOLIC PANEL
Anion gap: 7 (ref 5–15)
BUN: 27 mg/dL — ABNORMAL HIGH (ref 6–20)
CALCIUM: 8.8 mg/dL — AB (ref 8.9–10.3)
CO2: 26 mmol/L (ref 22–32)
CREATININE: 1.07 mg/dL (ref 0.61–1.24)
Chloride: 104 mmol/L (ref 101–111)
GFR calc non Af Amer: >60 mL/min (ref >60)
GLUCOSE: 106 mg/dL — AB (ref 65–99)
Potassium: 3.2 mmol/L — ABNORMAL LOW (ref 3.5–5.1)
Sodium: 137 mmol/L (ref 135–145)

## 2017-09-19 LAB — HEPARIN LEVEL (UNFRACTIONATED): Heparin Unfractionated: 0.55 IU/mL (ref 0.30–0.70)

## 2017-09-19 SURGERY — LEFT HEART CATH AND CORS/GRAFTS ANGIOGRAPHY
Anesthesia: Moderate Sedation

## 2017-09-19 MED ORDER — BIVALIRUDIN TRIFLUOROACETATE 250 MG IV SOLR
INTRAVENOUS | Status: AC
  Filled 2017-09-19: qty 250

## 2017-09-19 MED ORDER — BIVALIRUDIN TRIFLUOROACETATE 250 MG IV SOLR
INTRAVENOUS | Status: DC | PRN
  Administered 2017-09-19: 1.75 mg/kg/h via INTRAVENOUS

## 2017-09-19 MED ORDER — CLOPIDOGREL BISULFATE 300 MG PO TABS
ORAL_TABLET | ORAL | Status: AC
  Filled 2017-09-19: qty 1

## 2017-09-19 MED ORDER — ASPIRIN 81 MG PO CHEW
CHEWABLE_TABLET | ORAL | Status: DC | PRN
Start: 2017-09-19 — End: 2017-09-19
  Administered 2017-09-19: 324 mg via ORAL

## 2017-09-19 MED ORDER — SODIUM CHLORIDE 0.9 % WEIGHT BASED INFUSION: 1.0000 mL/kg/h | INTRAVENOUS | Status: AC

## 2017-09-19 MED ORDER — SODIUM CHLORIDE 0.9 % IV SOLN: 250.0000 mL | INTRAVENOUS | Status: DC | PRN

## 2017-09-19 MED ORDER — MIDAZOLAM HCL 2 MG/2ML IJ SOLN
INTRAMUSCULAR | Status: DC | PRN
  Administered 2017-09-19: 1 mg via INTRAVENOUS

## 2017-09-19 MED ORDER — BIVALIRUDIN BOLUS VIA INFUSION - CUPID
INTRAVENOUS | Status: DC | PRN
Start: 2017-09-19 — End: 2017-09-19
  Administered 2017-09-19: 52.95 mg via INTRAVENOUS

## 2017-09-19 MED ORDER — MIDAZOLAM HCL 2 MG/2ML IJ SOLN
INTRAMUSCULAR | Status: AC
  Filled 2017-09-19: qty 2

## 2017-09-19 MED ORDER — ASPIRIN 81 MG PO CHEW: 81.0000 mg | CHEWABLE_TABLET | ORAL | Status: DC

## 2017-09-19 MED ORDER — SODIUM CHLORIDE 0.9% FLUSH: 3.0000 mL | INTRAVENOUS | Status: DC | PRN

## 2017-09-19 MED ORDER — FENTANYL CITRATE (PF) 100 MCG/2ML IJ SOLN
INTRAMUSCULAR | Status: DC | PRN
  Administered 2017-09-19 (×2): 25 ug via INTRAVENOUS

## 2017-09-19 MED ORDER — SODIUM CHLORIDE 0.9 % WEIGHT BASED INFUSION: 1.0000 mL/kg/h | INTRAVENOUS | Status: DC

## 2017-09-19 MED ORDER — SODIUM CHLORIDE 0.9% FLUSH: 3.0000 mL | Freq: Two times a day (BID) | INTRAVENOUS | Status: DC

## 2017-09-19 MED ORDER — HEPARIN (PORCINE) IN NACL 2-0.9 UNIT/ML-% IJ SOLN
INTRAMUSCULAR | Status: AC
  Filled 2017-09-19: qty 500

## 2017-09-19 MED ORDER — CLOPIDOGREL BISULFATE 75 MG PO TABS
ORAL_TABLET | ORAL | Status: DC | PRN
Start: 2017-09-19 — End: 2017-09-19
  Administered 2017-09-19: 300 mg via ORAL

## 2017-09-19 MED ORDER — NITROGLYCERIN 5 MG/ML IV SOLN
INTRAVENOUS | Status: AC
  Filled 2017-09-19: qty 10

## 2017-09-19 MED ORDER — IOPAMIDOL (ISOVUE-300) INJECTION 61%
INTRAVENOUS | Status: DC | PRN
  Administered 2017-09-19: 80 mL via INTRA_ARTERIAL

## 2017-09-19 MED ORDER — FENTANYL CITRATE (PF) 100 MCG/2ML IJ SOLN
INTRAMUSCULAR | Status: AC
  Filled 2017-09-19: qty 2

## 2017-09-19 MED ORDER — SODIUM CHLORIDE 0.9 % WEIGHT BASED INFUSION: 3.0000 mL/kg/h | INTRAVENOUS | Status: DC

## 2017-09-19 MED ORDER — ASPIRIN 81 MG PO CHEW
CHEWABLE_TABLET | ORAL | Status: AC
  Filled 2017-09-19: qty 4

## 2017-09-19 MED ORDER — IOPAMIDOL (ISOVUE-300) INJECTION 61%
INTRAVENOUS | Status: DC | PRN
  Administered 2017-09-19: 100 mL via INTRA_ARTERIAL

## 2017-09-19 SURGICAL SUPPLY — 23 items
BALLN TREK RX 2.75X12 (BALLOONS) ×3
BALLN ~~LOC~~ TREK RX 3.5X12 (BALLOONS) ×3
BALLOON TREK RX 2.75X12 (BALLOONS) IMPLANT
BALLOON ~~LOC~~ TREK RX 3.5X12 (BALLOONS) IMPLANT
CATH GUIDE ADROIT 6FR AL.75 (CATHETERS) ×2 IMPLANT
CATH INFINITI 5 FR IM (CATHETERS) ×2 IMPLANT
CATH INFINITI 5FR ANG PIGTAIL (CATHETERS) ×2 IMPLANT
CATH INFINITI 5FR JL4 (CATHETERS) ×2 IMPLANT
CATH INFINITI JR4 5F (CATHETERS) ×2 IMPLANT
DEVICE CLOSURE MYNXGRIP 6/7F (Vascular Products) ×2 IMPLANT
DEVICE INFLAT 30 PLUS (MISCELLANEOUS) ×2 IMPLANT
GUIDELINER 6F (CATHETERS) ×4 IMPLANT
KIT MANI 3VAL PERCEP (MISCELLANEOUS) ×3 IMPLANT
NDL PERC 18GX7CM (NEEDLE) IMPLANT
NEEDLE PERC 18GX7CM (NEEDLE) ×3 IMPLANT
PACK CARDIAC CATH (CUSTOM PROCEDURE TRAY) ×3 IMPLANT
SHEATH AVANTI 6FR X 11CM (SHEATH) ×2 IMPLANT
SHEATH PINNACLE 5F 10CM (SHEATH) ×2 IMPLANT
STENT SIERRA 3.25 X 15 MM (Permanent Stent) ×2 IMPLANT
VALVE COPILOT STAT (MISCELLANEOUS) ×2 IMPLANT
WIRE EMERALD 3MM-J .035X150CM (WIRE) ×2 IMPLANT
WIRE INTUITION PROPEL ST 180CM (WIRE) ×2 IMPLANT
WIRE RUNTHROUGH .014X180CM (WIRE) ×2 IMPLANT

## 2017-09-19 NOTE — Progress Notes (Signed)
[facility] Cardiology [facility] Encounter Note  Patient: Dwayne Terry / Admit Date: 09/16/2017 / Date of Encounter: 09/19/2017, 8:52 AM   Subjective: No further episodes of chest discomfort.  Echocardiogram shows moderate systolic dysfunction from previous myocardial infarction with ejection fraction of 30-35% and mild mitral regurgitation Cardiac catheterization shows continued LV systolic dysfunction with patent SVG to diagonal and LIMA to LAD with restenosis of mid right coronary artery stent causing non-ST elevation myocardial infarction Review of Systems: Positive for: None Negative for: Vision change, hearing change, syncope, dizziness, nausea, vomiting,diarrhea, bloody stool, stomach pain, cough, congestion, diaphoresis, urinary frequency, urinary pain,skin lesions, skin rashes Others previously listed  Objective: Telemetry: Normal sinus rhythm Physical Exam: Blood pressure 132/82, pulse 72, temperature 98.4 F (36.9 C), resp. rate 18, height 5\' 8"  (1.727 m), weight 70.6 kg (155 lb 9.6 oz), SpO2 97 %. Body mass index is 23.66 kg/m. General: Well developed, well nourished, in no acute distress. Head: Normocephalic, atraumatic, sclera non-icteric, no xanthomas, nares are without discharge. Neck: No apparent masses Lungs: Normal respirations with no wheezes, no rhonchi, no rales , no crackles   Heart: Regular rate and rhythm, normal S1 S2, no murmur, no rub, no gallop, PMI is normal size and placement, carotid upstroke normal without bruit, jugular venous pressure normal Abdomen: Soft, non-tender, non-distended with normoactive bowel sounds. No hepatosplenomegaly. Abdominal aorta is normal size without bruit Extremities: No edema, no clubbing, no cyanosis, no ulcers,  Peripheral: 2+ radial, 2+ femoral, 2+ dorsal pedal pulses Neuro: Alert and oriented. Moves all extremities spontaneously. Psych:  Responds to questions appropriately with a normal affect.   Intake/Output  Summary (Last 24 hours) at 09/19/17 0852 Last data filed at 09/19/17 0431  Gross per 24 hour  Intake              720 ml  Output             2200 ml  Net            -1480 ml    Inpatient Medications:  . [MAR Hold] acyclovir  400 mg Oral BID  . [MAR Hold] aspirin EC  81 mg Oral Daily  . [MAR Hold] atorvastatin  80 mg Oral q1800  . [MAR Hold] carvedilol  3.125 mg Oral BID WC  . [MAR Hold] clopidogrel  75 mg Oral Q breakfast  . [MAR Hold] finasteride  2.5 mg Oral Daily  . [MAR Hold] furosemide  40 mg Oral BID  . [MAR Hold] insulin aspart  0-9 Units Subcutaneous TID AC & HS  . [MAR Hold] sodium chloride flush  3 mL Intravenous Q12H   Infusions:  . bivalirudin (ANGIOMAX) infusion 5 mg/mL 1.75 mg/kg/hr (09/19/17 0841)  . heparin 950 Units/hr (09/18/17 2340)    Labs:  Recent Labs  09/18/17 0448 09/19/17 0549  NA 137 137  K 3.2* 3.2*  CL 104 104  CO2 24 26  GLUCOSE 105* 106*  BUN 31* 27*  CREATININE 1.07 1.07  CALCIUM 9.0 8.8*    Recent Labs  09/16/17 1831  AST 29  ALT 24  ALKPHOS 60  BILITOT 1.0  PROT 7.1  ALBUMIN 3.6    Recent Labs  09/16/17 1831  09/18/17 0448 09/19/17 0549  WBC 12.8*  < > 9.7 10.2  NEUTROABS 11.4*  --   --   --   HGB 12.7*  < > 11.3* 12.7*  HCT 37.1*  < > 33.6* 36.8*  MCV 93.7  < > 93.4 93.5  PLT  79*  < > 58* 57*  < > = values in this interval not displayed.  Recent Labs  09/16/17 2103 09/17/17 0404 09/17/17 0907 09/17/17 1455  TROPONINI 0.63* 1.24* 1.06* 0.91*   Invalid input(s): POCBNP No results for input(s): HGBA1C in the last 72 hours.   Weights: Filed Weights   09/16/17 1830 09/18/17 0500 09/19/17 0431  Weight: 77.1 kg (170 lb) 72 kg (158 lb 12.8 oz) 70.6 kg (155 lb 9.6 oz)     Radiology/Studies:  Dg Chest Port 1 View  Result Date: 09/16/2017 CLINICAL DATA:  Shortness of breath and chest pain EXAM: PORTABLE CHEST 1 VIEW COMPARISON:  Chest radiograph 06/10/2017 FINDINGS: Worsening interstitial opacities compared  to the prior examination. Status post CABG. Small right pleural effusion. No pneumothorax. IMPRESSION: Small right pleural effusion and basilar predominant mild interstitial pulmonary edema. Electronically Signed   By: Ulyses Jarred M.D.   On: 09/16/2017 19:01     Assessment and Recommendation  83 y.o. male with known coronary artery disease status post coronary bypass graft in the remote past and PCI and stent placement in the past with intervention earlier this year now with   restenosisOf mid right coronary artery and non-ST elevation myocardial infarction. 1. PCI and stent placement of mid right coronary artery had restenosis site 2. Plavix and aspirin for further risk reduction of myocardial infarction in-stent thrombosis 3. No change in furosemide for chronic lower extremity edema 4. High intensity cholesterol therapy with atorvastatin 5.  Plan for ambulation later on today for possible cardiac rehabilitation after non-ST elevation myocardial infarction and possible discharge tomorrow Signed, Serafina Royals M.D. FACC

## 2017-09-19 NOTE — Progress Notes (Addendum)
Patient referred to Cardiac Rehab with dx of NSTEMI / S/P Coronary Stents.  Patient with known CAD, sudden multiple heart attacks in the past, status post CABG in the remote past with subsequent need for PCI and stent.  In addition, patient had earlier intervention this year.  Now with NSTEMI.  Cardiac Cath performed today with findings of re-stenosis mid-RCA.  DES to to mid RCA.  Patient to remain on Plavix and ASA.  Patient to remain on lasix for chronic lower extremity edema.  On Lipitor for HLD.  Patient has hx of multiple myeloma and prostate cancer, DM, HLD, MI.  Discussed patient's dx, CAD, and placement of stent to mid RCA.  Patient has a good understanding of CAD.  Patient's cardiologist is Dr. Saralyn Pilar.  All other medical care patient receives at [place]. Patient has good understanding of risk factors for heart disease.  Patient informed this nurse that he has participated in the Cardiac Rehab Program here at [facility] before - around 2016.  Patient stated he enjoyed it.  He also stated the dietitian was right when she told him to combine heart healthy diet with exercise, as the patient reported significant weight loss.  Patient also reported feeling better as a result.  Patient expressed an interest in returning to our Cardiac Rehab program.  Patient was able to describe a lot of the details of our program and even name the nurses and EPs who work in [place].    I explained to patient that Cardiac Rehab had been ordered by Dr. Fletcher Anon due to NSTEMI and coronary stent.  I also informed the patient the [facility] [facility] would cover cardiac rehab 100% if he could get a referral from the [place].  Patient plans on going to the [place] on Wednesday of this week and will get referral at that time.  Heart Track brochure with phone number and fax number given to patient to take with him to the [place] on Wednesday.    Patient thanked me for coming in to talk with him about heart disease and cardiac rehab.    Roanna Epley, RN, BSN,  [facility] Cardiovascular and Pulmonary Nurse Navigator

## 2017-09-19 NOTE — Progress Notes (Signed)
[place] at [facility]                                                                                                                                                                                  Patient Demographics   Dwayne Terry, is a 83 y.o. male, DOB - 16-May-1930, DVV:[id number]  Admit date - 09/16/2017   Admitting Physician Lance Coon, MD  Outpatient Primary MD for the patient is Kirk Ruths, MD   LOS - 3  Subjective: Patient had stent placed he is feeling better denies any chest pain    Review of Systems:   CONSTITUTIONAL: No documented fever. No fatigue, weakness. No weight gain, no weight loss.  EYES: No blurry or double vision.  ENT: No tinnitus. No postnasal drip. No redness of the oropharynx.  RESPIRATORY: No cough, no wheeze, no hemoptysis. No dyspnea.  CARDIOVASCULAR: No chest pain. No orthopnea. No palpitations. No syncope.  GASTROINTESTINAL: No nausea, no vomiting or diarrhea. No abdominal pain. No melena or hematochezia.  GENITOURINARY: No dysuria or hematuria.  ENDOCRINE: No polyuria or nocturia. No heat or cold intolerance.  HEMATOLOGY: No anemia. No bruising. No bleeding.  INTEGUMENTARY: No rashes. No lesions.  MUSCULOSKELETAL: No arthritis. No swelling. No gout.  NEUROLOGIC: No numbness, tingling, or ataxia. No seizure-type activity.  PSYCHIATRIC: No anxiety. No insomnia. No ADD.    Vitals:   Vitals:   09/19/17 1045 09/19/17 1100 09/19/17 1120 09/19/17 1140  BP:  128/63 124/60 140/61  Pulse: 64 63 65 63  Resp: 11 13 12    Temp:      TempSrc:      SpO2: 95% 96% 97% 96%  Weight:      Height:        Wt Readings from Last 3 Encounters:  09/19/17 155 lb 9.6 oz (70.6 kg)  08/03/17 167 lb (75.8 kg)  06/11/17 165 lb 11.2 oz (75.2 kg)     Intake/Output Summary (Last 24 hours) at 09/19/17 1628 Last data filed at 09/19/17 0431  Gross per 24 hour  Intake              240 ml  Output             1400 ml  Net             -1160 ml    Physical Exam:   GENERAL: Pleasant-appearing in no apparent distress.  HEAD, EYES, EARS, NOSE AND THROAT: Atraumatic, normocephalic. Extraocular muscles are intact. Pupils equal and reactive to light. Sclerae anicteric. No conjunctival injection. No oro-pharyngeal erythema.  NECK: Supple. There is no jugular venous distention. No bruits, no lymphadenopathy, no thyromegaly.  HEART: Regular rate and rhythm,. No murmurs, no rubs, no clicks.  LUNGS: Clear to auscultation bilaterally. No rales or rhonchi. No wheezes.  ABDOMEN: Soft, flat, nontender, nondistended. Has good bowel sounds. No hepatosplenomegaly appreciated.  EXTREMITIES: No evidence of any cyanosis, clubbing, or peripheral edema.  +2 pedal and radial pulses bilaterally.  NEUROLOGIC: The patient is alert, awake, and oriented x3 with no focal motor or sensory deficits appreciated bilaterally.  SKIN: multiple areas of bruising around all his skin  Psych: Not anxious, depressed LN: No inguinal LN enlargement    Antibiotics   Anti-infectives    Start     Dose/Rate Route Frequency Ordered Stop   09/17/17 0058  acyclovir (ZOVIRAX) 200 MG capsule 400 mg     400 mg Oral 2 times daily 09/17/17 0058        Medications   Scheduled Meds: . acyclovir  400 mg Oral BID  . aspirin EC  81 mg Oral Daily  . atorvastatin  80 mg Oral q1800  . carvedilol  3.125 mg Oral BID WC  . clopidogrel  75 mg Oral Q breakfast  . finasteride  2.5 mg Oral Daily  . furosemide  40 mg Oral BID  . insulin aspart  0-9 Units Subcutaneous TID AC & HS  . sodium chloride flush  3 mL Intravenous Q12H  . sodium chloride flush  3 mL Intravenous Q12H   Continuous Infusions: . sodium chloride    . sodium chloride 1 mL/kg/hr (09/19/17 1239)   PRN Meds:.sodium chloride, acetaminophen **OR** acetaminophen, ondansetron **OR** ondansetron (ZOFRAN) IV, oxyCODONE, sodium chloride flush   Data Review:   Micro Results No results found for this  or any previous visit (from the past 240 hour(s)).  Radiology Reports Dg Chest Port 1 View  Result Date: 09/16/2017 CLINICAL DATA:  Shortness of breath and chest pain EXAM: PORTABLE CHEST 1 VIEW COMPARISON:  Chest radiograph 06/10/2017 FINDINGS: Worsening interstitial opacities compared to the prior examination. Status post CABG. Small right pleural effusion. No pneumothorax. IMPRESSION: Small right pleural effusion and basilar predominant mild interstitial pulmonary edema. Electronically Signed   By: Ulyses Jarred M.D.   On: 09/16/2017 19:01     CBC  Recent Labs Lab 09/16/17 1831 09/17/17 0404 09/18/17 0448 09/19/17 0549  WBC 12.8* 11.1* 9.7 10.2  HGB 12.7* 11.6* 11.3* 12.7*  HCT 37.1* 33.1* 33.6* 36.8*  PLT 79* 76* 58* 57*  MCV 93.7 92.6 93.4 93.5  MCH 32.1 32.5 31.6 32.3  MCHC 34.2 35.1 33.8 34.6  RDW 17.3* 17.2* 17.2* 17.2*  LYMPHSABS 0.8*  --   --   --   MONOABS 0.6  --   --   --   EOSABS 0.0  --   --   --   BASOSABS 0.0  --   --   --     Chemistries   Recent Labs Lab 09/16/17 1831 09/17/17 0404 09/18/17 0448 09/19/17 0549  NA 137 137 137 137  K 3.8 3.6 3.2* 3.2*  CL 107 107 104 104  CO2 20* 24 24 26   GLUCOSE 187* 129* 105* 106*  BUN 27* 29* 31* 27*  CREATININE 1.13 0.94 1.07 1.07  CALCIUM 9.4 8.8* 9.0 8.8*  AST 29  --   --   --   ALT 24  --   --   --   ALKPHOS 60  --   --   --   BILITOT 1.0  --   --   --    ------------------------------------------------------------------------------------------------------------------  estimated creatinine clearance is 48.8 mL/min (by C-G formula based on SCr of 1.07 mg/dL). ------------------------------------------------------------------------------------------------------------------ No results for input(s): HGBA1C in the last 72 hours. ------------------------------------------------------------------------------------------------------------------ No results for input(s): CHOL, HDL, LDLCALC, TRIG, CHOLHDL,  LDLDIRECT in the last 72 hours. ------------------------------------------------------------------------------------------------------------------ No results for input(s): TSH, T4TOTAL, T3FREE, THYROIDAB in the last 72 hours.  Invalid input(s): FREET3 ------------------------------------------------------------------------------------------------------------------ No results for input(s): VITAMINB12, FOLATE, FERRITIN, TIBC, IRON, RETICCTPCT in the last 72 hours.  Coagulation profile  Recent Labs Lab 09/16/17 2200  INR 1.11    No results for input(s): DDIMER in the last 72 hours.  Cardiac Enzymes  Recent Labs Lab 09/17/17 0404 09/17/17 0907 09/17/17 1455  TROPONINI 1.24* 1.06* 0.91*   ------------------------------------------------------------------------------------------------------------------ Invalid input(s): POCBNP    Assessment & Plan  Patient is a 83 year old with known history of coronary artery disease  1.  NSTEMI (non-ST elevated myocardial infarction) ([place]) - tatus post RCA stent Continue aspirin and Plavix Continue Lipitor 2.   Diabetes ([place]) - sliding scale insulin corresponding glucose checks, discontinue metformin for cath 3.   HLD (hyperlipidemia) - continue atorvastatin 4.  bPH continue Proscar 5. Acute systolic CHF  now compensated 6. Chronic thrombocytopenia worse with heparin therapy continue to monitor from May need to stop     Code Status Orders        Start     Ordered   09/17/17 0059  Full code  Continuous     09/17/17 0058    Code Status History    Date Active Date Inactive Code Status Order ID Comments User Context   06/10/2017  3:24 AM 06/11/2017  4:13 PM Full Code [id number]  Harrie Foreman, MD Inpatient   04/29/2017  5:12 AM 04/30/2017  2:37 PM Full Code [id number]  Harrie Foreman, MD Inpatient   01/27/2017 11:56 PM 01/29/2017  2:30 PM Full Code [id number]  Hugelmeyer, Ubaldo Glassing, DO Inpatient    Advance Directive Documentation      Most Recent Value  Type of Advance Directive  Living will  Pre-existing out of facility DNR order (yellow form or pink MOST form)  -  "MOST" Form in Place?  -           Consults  Cardiology DVT Prophylaxis  Heparin monitor with thrombocytopenia  Lab Results  Component Value Date   PLT 57 (L) 09/19/2017     Time Spent in minutes   32 minutes Greater than 50% of time spent in care coordination and counseling patient regarding the condition and plan of care.   Dustin Flock M.D on 09/19/2017 at 4:28 PM  Between 7am to 6pm - Pager - [phone]  After 6pm go to [email or web address] - password [email or web address] [facility]   Office  [phone]

## 2017-09-19 NOTE — Progress Notes (Signed)
Discussed with Dr Nehemiah Massed patient's platelet count with regards to post-PCI medications. No new orders, will continue to monitor patient for bleeding. Forwarded family's questions regarding patient's clearance for Chemo Thurday. MD to discuss with patient, advised that family inform oncologist of procedure/admission, forwarded MD request to patient and family.

## 2017-09-19 NOTE — Care Management Important Message (Signed)
Important Message  Patient Details  Name: Dwayne Terry MRN: [id number] Date of Birth: 03/23/1930   Medicare Important Message Given:  Yes    Shelbie Ammons, RN 09/19/2017, 11:19 AM

## 2017-09-19 NOTE — Progress Notes (Signed)
Patient off unit for cardiac procedure. Will resume care upon return. Dwayne Terry

## 2017-09-19 NOTE — Care Management Note (Signed)
Case Management Note  Patient Details  Name: Dwayne Terry MRN: [id number] Date of Birth: 03-20-1930  Subjective/Objective:    Admitted to [facility] with the diagnosis of NSTEMI. Lives with wife, Ethlyn Daniels [phone]). Last seen Dr. Ouida Sills a couple of weeks ago. Prescriptions are filled at [facility] in [place]. No home Health. No skilled facility. No home oxygen., Wheelchair, bedside commode, rolling walker, and cane in the home. Takes care of all basic activities of daily living himself, drives. Family will transport.                 Action/Plan: Cardiac Catheterization completed 09/19/17. Will continue to follow for discharge needs   Expected Discharge Date:                  Expected Discharge Plan:     In-House Referral:     Discharge planning Services     Post Acute Care Choice:    Choice offered to:     DME Arranged:    DME Agency:     HH Arranged:    HH Agency:     Status of Service:     If discussed at [employer] [facility] [employer], dates discussed:    Additional Comments:  Shelbie Ammons, RN MSN CCM Care Management [phone] 09/19/2017, 2:18 PM

## 2017-09-19 NOTE — Progress Notes (Signed)
ANTICOAGULATION CONSULT NOTE - Follow up [facility] for Heparin drip Indication: chest pain/ACS  Allergies  Allergen Reactions  . Antihistamines, Chlorpheniramine-Type Other (See Comments)    Reaction: unknown  . Nitroglycerin Other (See Comments)    Reaction: hypotension    Patient Measurements: Height: 5\' 8"  (172.7 cm) Weight: 155 lb 9.6 oz (70.6 kg) IBW/kg (Calculated) : 68.4 Heparin Dosing Weight: 77.1 kg  Vital Signs: Temp: 97.9 F (36.6 C) (10/22 0431) Temp Source: Oral (10/22 0431) BP: 150/75 (10/22 0431) Pulse Rate: 73 (10/22 0431)  Labs:  Recent Labs  09/16/17 2200 09/17/17 0404  09/17/17 0907 09/17/17 1455 09/17/17 1654 09/18/17 0448 09/19/17 0549  HGB  --  11.6*  --   --   --   --  11.3* 12.7*  HCT  --  33.1*  --   --   --   --  33.6* 36.8*  PLT  --  76*  --   --   --   --  58* 57*  APTT 24  --   --   --   --   --   --   --   LABPROT 14.2  --   --   --   --   --   --   --   INR 1.11  --   --   --   --   --   --   --   HEPARINUNFRC  --   --   < > 0.57  --  0.38 0.45 0.55  CREATININE  --  0.94  --   --   --   --  1.07 1.07  TROPONINI  --  1.24*  --  1.06* 0.91*  --   --   --   < > = values in this interval not displayed.  Estimated Creatinine Clearance: 48.8 mL/min (by C-G formula based on SCr of 1.07 mg/dL).     Assessment: 83 yo male starting on heparin. No oral anticoagulants noted on PTA med list.  Plt count noted to be 69 - discussed with ordering ED MD who stated not to bolus heparin.    Goal of Therapy:  Heparin level 0.3-0.7 units/ml Monitor platelets by anticoagulation protocol: Yes   Plan:  STAT aPTT and INR ordered No bolus Start heparin drip at 950 units/hr (=9.5 ml/hr) Heparin level in 8h CBC in AM    10/20 @ 09:07   HL  = 0.57. Continue current drip rate.  Next HL ordered for tonight at 17:00.   10/20 @1654  HL=0.38. Level is still therapeutic. Continue current drip rate of 950units/hr. Recheck HL and CBC  with AM labs.   10/21 @ 0448 = 0.45.  Will continue this pt at current rate and recheck HL on 10/22 @ 0500.   10/22 @ 0549 = 0.55 Will continue this pt at current rate and recheck HL on 10/23 @ 0500.   Orene Desanctis, PharmD Clinical Pharmacist 09/19/2017 6:40 AM

## 2017-09-20 LAB — BASIC METABOLIC PANEL
Anion gap: 8 (ref 5–15)
BUN: 20 mg/dL (ref 6–20)
CHLORIDE: 104 mmol/L (ref 101–111)
CO2: 25 mmol/L (ref 22–32)
CREATININE: 0.88 mg/dL (ref 0.61–1.24)
Calcium: 8.1 mg/dL — ABNORMAL LOW (ref 8.9–10.3)
GFR calc Af Amer: >60 mL/min (ref >60)
GFR calc non Af Amer: >60 mL/min (ref >60)
Glucose, Bld: 108 mg/dL — ABNORMAL HIGH (ref 65–99)
Potassium: 3 mmol/L — ABNORMAL LOW (ref 3.5–5.1)
SODIUM: 137 mmol/L (ref 135–145)

## 2017-09-20 LAB — CBC
HCT: 34.8 % — ABNORMAL LOW (ref 40.0–52.0)
Hemoglobin: 11.9 g/dL — ABNORMAL LOW (ref 13.0–18.0)
MCH: 31.8 pg (ref 26.0–34.0)
MCHC: 34.2 g/dL (ref 32.0–36.0)
MCV: 92.9 fL (ref 80.0–100.0)
PLATELETS: 63 10*3/uL — AB (ref 150–440)
RBC: 3.74 MIL/uL — ABNORMAL LOW (ref 4.40–5.90)
RDW: 16.4 % — AB (ref 11.5–14.5)
WBC: 9.6 10*3/uL (ref 3.8–10.6)

## 2017-09-20 LAB — GLUCOSE, CAPILLARY
Glucose-Capillary: 110 mg/dL — ABNORMAL HIGH (ref 65–99)
Glucose-Capillary: 238 mg/dL — ABNORMAL HIGH (ref 65–99)

## 2017-09-20 MED ORDER — POTASSIUM CHLORIDE CRYS ER 20 MEQ PO TBCR
40.0000 meq | EXTENDED_RELEASE_TABLET | Freq: Once | ORAL | Status: AC
  Administered 2017-09-20: 40 meq via ORAL
  Filled 2017-09-20: qty 2

## 2017-09-20 MED ORDER — POTASSIUM CHLORIDE CRYS ER 20 MEQ PO TBCR: 20.0000 meq | EXTENDED_RELEASE_TABLET | Freq: Every day | ORAL | 0 refills | Status: DC

## 2017-09-20 MED ORDER — LISINOPRIL 2.5 MG PO TABS: 2.5000 mg | ORAL_TABLET | Freq: Every day | ORAL | 11 refills | Status: DC

## 2017-09-20 MED ORDER — FUROSEMIDE 20 MG PO TABS: 20.0000 mg | ORAL_TABLET | Freq: Every day | ORAL | 11 refills | Status: DC

## 2017-09-20 NOTE — Discharge Summary (Signed)
[name], 83 y.o., DOB [birth year], MRN [id number]. Admission date: 09/16/2017 Discharge Date 09/20/2017 Primary MD Kirk Ruths, MD Admitting Physician Lance Coon, MD  Admission Diagnosis  NSTEMI (non-ST elevated myocardial infarction) [facility]) [I21.4]  Discharge Diagnosis   Principal Problem:   NSTEMI (non-ST elevated myocardial infarction) ([place])   Acute on chronic systolic CHF Diabetes type 2   HLD (hyperlipidemia)  BPH Chronic thrombocytopenia       Hospital Course Dwayne Terry  is a 83 y.o. male who presents with chest pain. Patient was noted to have elevated troponin consistent with a non-ST MI. He was seen in consultation by cardiology and started on IV heparin drip he underwent a cardiac catheterization which showed restenosis of his previous placed stent in the RCA. Patient underwent another stent placement. His medications were adjusted per cardiology. They recommended outpatient follow-up. Currently is not having any chest pain. Was also noticed to have acute CHF was was treated with Lasix.           Consults  cardiology  Significant Tests:  See full reports for all details     Dg Chest Port 1 View  Result Date: 09/16/2017 CLINICAL DATA:  Shortness of breath and chest pain EXAM: PORTABLE CHEST 1 VIEW COMPARISON:  Chest radiograph 06/10/2017 FINDINGS: Worsening interstitial opacities compared to the prior examination. Status post CABG. Small right pleural effusion. No pneumothorax. IMPRESSION: Small right pleural effusion and basilar predominant mild interstitial pulmonary edema. Electronically Signed   By: Ulyses Jarred M.D.   On: 09/16/2017 19:01       Today   Subjective:   Dwayne Terry feels well denies any chest pain or shortness of breath  Objective:   Blood pressure 127/80, pulse 72, temperature (!) 97.4 F (36.3 C), temperature source Oral, resp. rate 18, height 5\' 8"  (1.727 m),  weight 153 lb 9.6 oz (69.7 kg), SpO2 100 %.  .  Intake/Output Summary (Last 24 hours) at 09/20/17 1643 Last data filed at 09/20/17 0922  Gross per 24 hour  Intake              240 ml  Output             1150 ml  Net             -910 ml    Exam VITAL SIGNS: Blood pressure 127/80, pulse 72, temperature (!) 97.4 F (36.3 C), temperature source Oral, resp. rate 18, height 5\' 8"  (1.727 m), weight 153 lb 9.6 oz (69.7 kg), SpO2 100 %.  GENERAL:  83 y.o.-year-old patient lying in the bed with no acute distress.  EYES: Pupils equal, round, reactive to light and accommodation. No scleral icterus. Extraocular muscles intact.  HEENT: Head atraumatic, normocephalic. Oropharynx and nasopharynx clear.  NECK:  Supple, no jugular venous distention. No thyroid enlargement, no tenderness.  LUNGS: Normal breath sounds bilaterally, no wheezing, rales,rhonchi or crepitation. No use of accessory muscles of respiration.  CARDIOVASCULAR: S1, S2 normal. No murmurs, rubs, or gallops.  ABDOMEN: Soft, nontender, nondistended. Bowel sounds present. No organomegaly or mass.  EXTREMITIES: No pedal edema, cyanosis, or clubbing.  NEUROLOGIC: Cranial nerves II through XII are intact. Muscle strength 5/5 in all extremities. Sensation intact. Gait not checked.  PSYCHIATRIC: The patient is alert and oriented x 3.  SKIN: No obvious rash, lesion, or ulcer.   Data Review     CBC w Diff: Lab Results  Component Value Date  WBC 9.6 09/20/2017   HGB 11.9 (L) 09/20/2017   HGB 13.8 07/10/2013   HCT 34.8 (L) 09/20/2017   HCT 37.9 (L) 07/10/2013   PLT 63 (L) 09/20/2017   PLT 93 (L) 07/10/2013   LYMPHOPCT 6 09/16/2017   LYMPHOPCT 27.9 07/09/2013   MONOPCT 4 09/16/2017   MONOPCT 8 07/10/2013   MONOPCT 17.4 07/09/2013   EOSPCT 0 09/16/2017   EOSPCT 0.2 07/09/2013   BASOPCT 0 09/16/2017   BASOPCT 0.4 07/09/2013   CMP: Lab Results  Component Value Date   NA 137 09/20/2017   NA 136 07/10/2013   K 3.0 (L) 09/20/2017    K 4.2 07/10/2013   CL 104 09/20/2017   CL 106 07/10/2013   CO2 25 09/20/2017   CO2 27 07/10/2013   BUN 20 09/20/2017   BUN 16 07/10/2013   CREATININE 0.88 09/20/2017   CREATININE 0.97 07/10/2013   PROT 7.1 09/16/2017   PROT 7.8 07/07/2013   ALBUMIN 3.6 09/16/2017   ALBUMIN 3.2 (L) 07/07/2013   BILITOT 1.0 09/16/2017   BILITOT 0.4 07/07/2013   ALKPHOS 60 09/16/2017   ALKPHOS 59 07/07/2013   AST 29 09/16/2017   AST 22 07/07/2013   ALT 24 09/16/2017   ALT 20 07/07/2013  .  Micro Results No results found for this or any previous visit (from the past 240 hour(s)).   Code Status History    Date Active Date Inactive Code Status Order ID Comments User Context   09/17/2017 12:58 AM 09/20/2017  3:24 PM Full Code [id number]  Lance Coon, MD Inpatient   06/10/2017  3:24 AM 06/11/2017  4:13 PM Full Code [id number]  Harrie Foreman, MD Inpatient   04/29/2017  5:12 AM 04/30/2017  2:37 PM Full Code [id number]  Harrie Foreman, MD Inpatient   01/27/2017 11:56 PM 01/29/2017  2:30 PM Full Code [id number]  Hugelmeyer, Ubaldo Glassing, DO Inpatient    Advance Directive Documentation     Most Recent Value  Type of Advance Directive  Living will  Pre-existing out of facility DNR order (yellow form or pink MOST form)  -  "MOST" Form in Place?  -          Follow-up Information    [name], MD On 09/26/2017.   Specialty:  Cardiology Why:  Appointment Time: 11:00am Contact information: [facility] [address] [place] [address] [phone]           Discharge Medications   Allergies as of 09/20/2017      Reactions   Antihistamines, Chlorpheniramine-type Other (See Comments)   Reaction: unknown   Nitroglycerin Other (See Comments)   Reaction: hypotension      Medication List    TAKE these medications   acetaminophen 325 MG tablet Commonly known as:  TYLENOL Take 650 mg by mouth every 6 (six) hours as needed.   acyclovir 400 MG  tablet Commonly known as:  ZOVIRAX Take 400 mg by mouth 2 (two) times daily.   aspirin EC 81 MG tablet Take 1 tablet (81 mg total) by mouth daily.   atorvastatin 80 MG tablet Commonly known as:  LIPITOR Take 80 mg by mouth daily at 6 PM.   carvedilol 6.25 MG tablet Commonly known as:  COREG Take 1 tablet (6.25 mg total) by mouth 2 (two) times daily with a meal. What changed:  how much to take   clopidogrel 75 MG tablet Commonly known as:  PLAVIX Take 1 tablet (75 mg total) by mouth daily  with breakfast.   dexamethasone 2 MG tablet Commonly known as:  DECADRON Take 8.5 mg by mouth See admin instructions. Take 8.5 mg on Thursday and Friday.   finasteride 5 MG tablet Commonly known as:  PROSCAR Take 2.5 mg by mouth daily.   furosemide 20 MG tablet Commonly known as:  LASIX Take 1 tablet (20 mg total) by mouth daily.   lisinopril 2.5 MG tablet Commonly known as:  ZESTRIL Take 1 tablet (2.5 mg total) by mouth daily.   metFORMIN 500 MG tablet Commonly known as:  GLUCOPHAGE Take 500 mg by mouth 2 (two) times daily with a meal.   MULTI-VITAMINS Tabs Take 1 tablet by mouth daily.   potassium chloride SA 20 MEQ tablet Commonly known as:  K-DUR,KLOR-CON Take 1 tablet (20 mEq total) by mouth daily.   silver sulfADIAZINE 1 % cream Commonly known as:  SILVADENE Apply to affected area daily          Total Time in preparing paper work, data evaluation and todays exam - 35 minutes  Dustin Flock M.D on 09/20/2017 at 4:43 PM  [facility]   Office  [phone]

## 2017-09-20 NOTE — Progress Notes (Signed)
[facility] Encounter Note  Patient: Dwayne Terry / Admit Date: 09/16/2017 / Date of Encounter: 09/20/2017, 8:01 AM   Subjective: No further episodes of chest discomfort. Ambulating well Echocardiogram shows moderate systolic dysfunction from previous myocardial infarction with ejection fraction of 30-35% and mild mitral regurgitation Cardiac catheterization shows continued LV systolic dysfunction with patent SVG to diagonal and LIMA to LAD with restenosis of mid right coronary artery stent causing non-ST elevation myocardial infarction Review of Systems: Positive for: None Negative for: Vision change, hearing change, syncope, dizziness, nausea, vomiting,diarrhea, bloody stool, stomach pain, cough, congestion, diaphoresis, urinary frequency, urinary pain,skin lesions, skin rashes Others previously listed  Objective: Telemetry: Normal sinus rhythm Physical Exam: Blood pressure 115/63, pulse 75, temperature (!) 97.4 F (36.3 C), temperature source Oral, resp. rate 17, height 5\' 8"  (1.727 m), weight 69.7 kg (153 lb 9.6 oz), SpO2 98 %. Body mass index is 23.35 kg/m. General: Well developed, well nourished, in no acute distress. Head: Normocephalic, atraumatic, sclera non-icteric, no xanthomas, nares are without discharge. Neck: No apparent masses Lungs: Normal respirations with no wheezes, no rhonchi, no rales , no crackles   Heart: Regular rate and rhythm, normal S1 S2, no murmur, no rub, no gallop, PMI is normal size and placement, carotid upstroke normal without bruit, jugular venous pressure normal Abdomen: Soft, non-tender, non-distended with normoactive bowel sounds. No hepatosplenomegaly. Abdominal aorta is normal size without bruit Extremities: No edema, no clubbing, no cyanosis, no ulcers,  Peripheral: 2+ radial, 2+ femoral, 2+ dorsal pedal pulses Neuro: Alert and oriented. Moves all extremities spontaneously. Psych:  Responds to questions appropriately  with a normal affect.   Intake/Output Summary (Last 24 hours) at 09/20/17 0801 Last data filed at 09/20/17 0300  Gross per 24 hour  Intake              240 ml  Output              750 ml  Net             -510 ml    Inpatient Medications:  . acyclovir  400 mg Oral BID  . aspirin EC  81 mg Oral Daily  . atorvastatin  80 mg Oral q1800  . carvedilol  3.125 mg Oral BID WC  . clopidogrel  75 mg Oral Q breakfast  . finasteride  2.5 mg Oral Daily  . furosemide  40 mg Oral BID  . insulin aspart  0-9 Units Subcutaneous TID AC & HS  . sodium chloride flush  3 mL Intravenous Q12H  . sodium chloride flush  3 mL Intravenous Q12H   Infusions:  . sodium chloride      Labs:  Recent Labs  09/19/17 0549 09/20/17 0516  NA 137 137  K 3.2* 3.0*  CL 104 104  CO2 26 25  GLUCOSE 106* 108*  BUN 27* 20  CREATININE 1.07 0.88  CALCIUM 8.8* 8.1*   No results for input(s): AST, ALT, ALKPHOS, BILITOT, PROT, ALBUMIN in the last 72 hours.  Recent Labs  09/19/17 0549 09/20/17 0516  WBC 10.2 9.6  HGB 12.7* 11.9*  HCT 36.8* 34.8*  MCV 93.5 92.9  PLT 57* 63*    Recent Labs  09/17/17 0907 09/17/17 1455  TROPONINI 1.06* 0.91*   Invalid input(s): POCBNP No results for input(s): HGBA1C in the last 72 hours.   Weights: Filed Weights   09/18/17 0500 09/19/17 0431 09/20/17 0345  Weight: 72 kg (158 lb 12.8 oz) 70.6 kg (155  lb 9.6 oz) 69.7 kg (153 lb 9.6 oz)     Radiology/Studies:  Dg Chest Port 1 View  Result Date: 09/16/2017 CLINICAL DATA:  Shortness of breath and chest pain EXAM: PORTABLE CHEST 1 VIEW COMPARISON:  Chest radiograph 06/10/2017 FINDINGS: Worsening interstitial opacities compared to the prior examination. Status post CABG. Small right pleural effusion. No pneumothorax. IMPRESSION: Small right pleural effusion and basilar predominant mild interstitial pulmonary edema. Electronically Signed   By: Ulyses Jarred M.D.   On: 09/16/2017 19:01     Assessment and Recommendation   83 y.o. male with known coronary artery disease status post coronary bypass graft in the remote past and PCI and stent placement in the past with intervention earlier this year now with   restenosisOf mid right coronary artery and non-ST elevation myocardial infarction. 1. PCI and stent placement of mid right coronary artery had restenosis site without comlpication 2. Plavix and aspirin for further risk reduction of myocardial infarction in-stent thrombosis 3. No change in furosemide for chronic lower extremity edema 4. High intensity cholesterol therapy with atorvastatin 5.  Plan for ambulation later on today for possible cardiac rehabilitation after non-ST elevation myocardial infarction and possible discharge today Signed, Serafina Royals M.D. FACC

## 2017-09-20 NOTE — Care Management (Signed)
No discharge needs identified by members of the care team Is not being discharged home on cost prohibitive antiplatelet medication

## 2017-09-20 NOTE — Progress Notes (Signed)
Discharge instructions explained to pt and pts family/ verbalized an undersensing/ iv and tele removed/ transported off unit via wheelchair.

## 2017-10-28 ENCOUNTER — Inpatient Hospital Stay
Admission: EM | Admit: 2017-10-28 | Discharge: 2017-10-31 | DRG: 281 | Disposition: A | Discharge status: Home / Self Care | Payer: Medicare HMO | Attending: Internal Medicine | Admitting: Internal Medicine

## 2017-10-28 ENCOUNTER — Other Ambulatory Visit: Payer: Self-pay

## 2017-10-28 ENCOUNTER — Emergency Department: Payer: Medicare HMO

## 2017-10-28 ENCOUNTER — Encounter: Payer: Self-pay | Admitting: Emergency Medicine

## 2017-10-28 DIAGNOSIS — E119 Type 2 diabetes mellitus without complications: Secondary | ICD-10-CM

## 2017-10-28 DIAGNOSIS — Z7982 Long term (current) use of aspirin: Secondary | ICD-10-CM

## 2017-10-28 DIAGNOSIS — R079 Chest pain, unspecified: Secondary | ICD-10-CM

## 2017-10-28 DIAGNOSIS — R9431 Abnormal electrocardiogram [ECG] [EKG]: Secondary | ICD-10-CM | POA: Diagnosis not present

## 2017-10-28 DIAGNOSIS — Z888 Allergy status to other drugs, medicaments and biological substances status: Secondary | ICD-10-CM

## 2017-10-28 DIAGNOSIS — I214 Non-ST elevation (NSTEMI) myocardial infarction: Secondary | ICD-10-CM | POA: Diagnosis not present

## 2017-10-28 DIAGNOSIS — C9000 Multiple myeloma not having achieved remission: Secondary | ICD-10-CM | POA: Diagnosis present

## 2017-10-28 DIAGNOSIS — E860 Dehydration: Secondary | ICD-10-CM | POA: Diagnosis present

## 2017-10-28 DIAGNOSIS — D649 Anemia, unspecified: Secondary | ICD-10-CM | POA: Diagnosis present

## 2017-10-28 DIAGNOSIS — Z7902 Long term (current) use of antithrombotics/antiplatelets: Secondary | ICD-10-CM

## 2017-10-28 DIAGNOSIS — E785 Hyperlipidemia, unspecified: Secondary | ICD-10-CM | POA: Diagnosis present

## 2017-10-28 DIAGNOSIS — Z8546 Personal history of malignant neoplasm of prostate: Secondary | ICD-10-CM

## 2017-10-28 DIAGNOSIS — Z87891 Personal history of nicotine dependence: Secondary | ICD-10-CM

## 2017-10-28 DIAGNOSIS — I252 Old myocardial infarction: Secondary | ICD-10-CM

## 2017-10-28 DIAGNOSIS — Z79899 Other long term (current) drug therapy: Secondary | ICD-10-CM

## 2017-10-28 LAB — COMPREHENSIVE METABOLIC PANEL
ALK PHOS: 73 U/L (ref 38–126)
ALT: 24 U/L (ref 17–63)
AST: 30 U/L (ref 15–41)
Albumin: 3.6 g/dL (ref 3.5–5.0)
Anion gap: 11 (ref 5–15)
BILIRUBIN TOTAL: 0.6 mg/dL (ref 0.3–1.2)
BUN: 27 mg/dL — AB (ref 6–20)
CALCIUM: 8.8 mg/dL — AB (ref 8.9–10.3)
CO2: 18 mmol/L — ABNORMAL LOW (ref 22–32)
CREATININE: 1.08 mg/dL (ref 0.61–1.24)
Chloride: 106 mmol/L (ref 101–111)
GFR calc Af Amer: >60 mL/min (ref >60)
Glucose, Bld: 265 mg/dL — ABNORMAL HIGH (ref 65–99)
POTASSIUM: 4.4 mmol/L (ref 3.5–5.1)
Sodium: 135 mmol/L (ref 135–145)
TOTAL PROTEIN: 6.9 g/dL (ref 6.5–8.1)

## 2017-10-28 LAB — CBC WITH DIFFERENTIAL/PLATELET
BASOS PCT: 0 %
Basophils Absolute: 0 10*3/uL (ref 0–0.1)
EOS ABS: 0 10*3/uL (ref 0–0.7)
EOS PCT: 0 %
HCT: 34.7 % — ABNORMAL LOW (ref 40.0–52.0)
Hemoglobin: 11.5 g/dL — ABNORMAL LOW (ref 13.0–18.0)
LYMPHS ABS: 0.7 10*3/uL — AB (ref 1.0–3.6)
Lymphocytes Relative: 7 %
MCH: 32 pg (ref 26.0–34.0)
MCHC: 33.2 g/dL (ref 32.0–36.0)
MCV: 96.5 fL (ref 80.0–100.0)
MONO ABS: 0.6 10*3/uL (ref 0.2–1.0)
MONOS PCT: 5 %
Neutro Abs: 10 10*3/uL — ABNORMAL HIGH (ref 1.4–6.5)
Neutrophils Relative %: 88 %
Platelets: 132 10*3/uL — ABNORMAL LOW (ref 150–440)
RBC: 3.6 MIL/uL — ABNORMAL LOW (ref 4.40–5.90)
RDW: 16.1 % — ABNORMAL HIGH (ref 11.5–14.5)
WBC: 11.3 10*3/uL — ABNORMAL HIGH (ref 3.8–10.6)

## 2017-10-28 LAB — TYPE AND SCREEN
ABO/RH(D): O POS
ANTIBODY SCREEN: NEGATIVE

## 2017-10-28 LAB — BRAIN NATRIURETIC PEPTIDE: B NATRIURETIC PEPTIDE 5: 232 pg/mL — AB (ref 0.0–100.0)

## 2017-10-28 LAB — TROPONIN I: Troponin I: 0.03 ng/mL (ref <0.03)

## 2017-10-28 NOTE — ED Provider Notes (Addendum)
[facility] Emergency Department Provider Note   ____________________________________________   First MD Initiated Contact with Patient 10/28/17 2212     (approximate)  I have reviewed the triage vital signs and the nursing notes.   HISTORY  Chief Complaint Chest Pain    HPI Dwayne Terry is a 83 y.o. male Patient comes in by EMS complaining of chest pain "all day" and may have started around noon. Patient says he has gas pains in his chest and every time he gets them he has a heart attack but they always go away with Maalox and acid blockers. Patient reports easier coughing or vomiting up blood and bright red blood he's not sure which she has not done in the emergency room at this time. She takes aspirin and Plavix. She's had multiple heart issues and stents. Patient's gas pain is presently a 2 or 3 out of 10. He maybe a little short of breath. He was nauseated but has not now.   Past Medical History:  Diagnosis Date  . Cancer ([place])    bone marrow, prostate cancer  . Diabetes mellitus without complication ([place])   . Hyperlipidemia   . MI (mitral incompetence)   . MI (myocardial infarction) [facility])     Patient Active Problem List   Diagnosis Date Noted  . HLD (hyperlipidemia) 09/16/2017  . Diabetes ([place]) 09/16/2017  . Chest pain 04/29/2017  . NSTEMI (non-ST elevated myocardial infarction) ([place]) 04/29/2017  . Chest pain, rule out acute myocardial infarction 01/27/2017    Past Surgical History:  Procedure Laterality Date  . AORTIC VALVE REPLACEMENT (AVR)/CORONARY ARTERY BYPASS GRAFTING (CABG)    . CORONARY ANGIOPLASTY WITH STENT PLACEMENT    . CORONARY BALLOON ANGIOPLASTY N/A 06/10/2017   Procedure: Coronary Balloon Angioplasty;  Surgeon: Wellington Hampshire, MD;  Location: [place] CV LAB;  Service: Cardiovascular;  Laterality: N/A;  . CORONARY STENT INTERVENTION N/A 01/28/2017   Procedure: Coronary Stent Intervention;  Surgeon: [name], MD;  Location: [place] CV LAB;  Service: Cardiovascular;  Laterality: N/A;  . CORONARY STENT INTERVENTION N/A 04/29/2017   Procedure: Coronary Stent Intervention;  Surgeon: [name], MD;  Location: [place] CV LAB;  Service: Cardiovascular;  Laterality: N/A;  . CORONARY STENT INTERVENTION N/A 09/19/2017   Procedure: CORONARY/GRAFT ANGIOGRAPHY;  Surgeon: Corey Skains, MD;  Location: [place] CV LAB;  Service: Cardiovascular;  Laterality: N/A;  . CORONARY STENT INTERVENTION N/A 09/19/2017   Procedure: CORONARY STENT INTERVENTION;  Surgeon: Wellington Hampshire, MD;  Location: [place] CV LAB;  Service: Cardiovascular;  Laterality: N/A;  . LEFT HEART CATH AND CORONARY ANGIOGRAPHY N/A 01/28/2017   Procedure: Left Heart Cath and Coronary Angiography;  Surgeon: [name], MD;  Location: [place] CV LAB;  Service: Cardiovascular;  Laterality: N/A;  . LEFT HEART CATH AND CORS/GRAFTS ANGIOGRAPHY N/A 04/29/2017   Procedure: Left Heart Cath and Cors/Grafts Angiography;  Surgeon: [name], MD;  Location: [place] CV LAB;  Service: Cardiovascular;  Laterality: N/A;  . LEFT HEART CATH AND CORS/GRAFTS ANGIOGRAPHY N/A 06/10/2017   Procedure: Left Heart Cath and Cors/Grafts Angiography;  Surgeon: Corey Skains, MD;  Location: [place] CV LAB;  Service: Cardiovascular;  Laterality: N/A;  . LEFT HEART CATH AND CORS/GRAFTS ANGIOGRAPHY N/A 09/19/2017   Procedure: LEFT HEART CATH;  Surgeon: Corey Skains, MD;  Location: [place] CV LAB;  Service: Cardiovascular;  Laterality: N/A;    Prior to Admission medications   Medication Sig Start  Date End Date Taking? Authorizing Provider  acetaminophen (TYLENOL) 325 MG tablet Take 650 mg by mouth every 6 (six) hours as needed.   Yes [provider]  acyclovir (ZOVIRAX) 200 MG capsule Take 200 mg by mouth 2 (two) times daily.   Yes [provider]  aspirin EC 81 MG tablet  Take 1 tablet (81 mg total) by mouth daily. 01/29/17  Yes Loletha Grayer, MD  carvedilol (COREG) 6.25 MG tablet Take 1 tablet (6.25 mg total) by mouth 2 (two) times daily with a meal. Patient taking differently: Take 3.25 mg by mouth 2 (two) times daily with a meal.  01/29/17  Yes Leslye Peer, Richard, MD  clopidogrel (PLAVIX) 75 MG tablet Take 1 tablet (75 mg total) by mouth daily with breakfast. 04/30/17  Yes Sainani, Belia Heman, MD  dexamethasone (DECADRON) 2 MG tablet Take 8.5 mg by mouth See admin instructions. Take 8.5 mg on Thursday and Friday.   Yes [provider]  finasteride (PROSCAR) 5 MG tablet Take 2.5 mg by mouth daily.    Yes [provider]  furosemide (LASIX) 20 MG tablet Take 1 tablet (20 mg total) by mouth daily. 09/20/17 09/20/18 Yes Dustin Flock, MD  lisinopril (ZESTRIL) 2.5 MG tablet Take 1 tablet (2.5 mg total) by mouth daily. 09/20/17 09/20/18 Yes Dustin Flock, MD  lovastatin (MEVACOR) 20 MG tablet Take 20 mg by mouth daily at 6 PM.   Yes [provider]  metFORMIN (GLUCOPHAGE) 500 MG tablet Take 500 mg by mouth 2 (two) times daily with a meal.   Yes [provider]  Multiple Vitamin (MULTI-VITAMINS) TABS Take 1 tablet by mouth daily.   Yes [provider]  potassium chloride SA (K-DUR,KLOR-CON) 20 MEQ tablet Take 1 tablet (20 mEq total) by mouth daily. 09/20/17  Yes Dustin Flock, MD  silver sulfADIAZINE (SILVADENE) 1 % cream Apply to affected area daily Patient not taking: Reported on 09/16/2017 08/03/17 08/03/18  Duanne Guess, PA-C    Allergies Antihistamines, chlorpheniramine-type and Nitroglycerin  Family History  Problem Relation Age of Onset  . Heart disease Other     Social History Social History   Tobacco Use  . Smoking status: Former Research scientist (life sciences)  . Smokeless tobacco: Former Network engineer Use Topics  . Alcohol use: No  . Drug use: No    Review of Systems  Constitutional: No fever/chills Eyes: No visual  changes. ENT: No sore throat. Cardiovascular:chest pain. Respiratory:  shortness of breath. Gastrointestinal: No abdominal pain.  No nausea, no vomiting.  No diarrhea.  No constipation. Genitourinary: Negative for dysuria. Musculoskeletal: Negative for back pain. Skin: Negative for rash. Neurological: Negative for headaches, focal weakness  ____________________________________________   PHYSICAL EXAM:  VITAL SIGNS: ED Triage Vitals  Enc Vitals Group     BP 10/28/17 2210 (!) 155/79     Pulse Rate 10/28/17 2210 78     Resp 10/28/17 2210 (!) 22     Temp 10/28/17 2210 97.8 F (36.6 C)     Temp Source 10/28/17 2210 Oral     SpO2 10/28/17 2158 97 %     Weight 10/28/17 2212 159 lb (72.1 kg)     Height 10/28/17 2212 5\' 8"  (1.727 m)     Head Circumference --      Peak Flow --      Pain Score 10/28/17 2211 3     Pain Loc --      Pain Edu? --      Excl. in [place]? --  Constitutional: Alert and oriented. Well appearing and in no acute distress. Eyes: Conjunctivae are normal.  Head: Atraumatic. Nose: No congestion/rhinnorhea. Mouth/Throat: Mucous membranes are moist.  Oropharynx non-erythematous. Neck: No stridor. }Cardiovascular: Normal rate, regular rhythm. Grossly normal heart sounds.  Good peripheral circulation. Respiratory: Normal respiratory effort.  No retractions. Lungs CTAB. Gastrointestinal: Soft and nontender. No distention. No abdominal bruits. No CVA tenderness. Musculoskeletal: No lower extremity tendernesstrace edema.  No joint effusions. Neurologic:  Normal speech and language. No gross focal neurologic deficits are appreciated.  Skin:  Skin is warm, dry and intact. No rash noted. Psychiatric: Mood and affect are normal. Speech and behavior are normal.  ____________________________________________   LABS (all labs ordered are listed, but only abnormal results are displayed)  Labs Reviewed  COMPREHENSIVE METABOLIC PANEL - Abnormal; Notable for the following  components:      Result Value   CO2 18 (*)    Glucose, Bld 265 (*)    BUN 27 (*)    Calcium 8.8 (*)    All other components within normal limits  BRAIN NATRIURETIC PEPTIDE - Abnormal; Notable for the following components:   B Natriuretic Peptide 232.0 (*)    All other components within normal limits  TROPONIN I - Abnormal; Notable for the following components:   Troponin I 0.03 (*)    All other components within normal limits  CBC WITH DIFFERENTIAL/PLATELET - Abnormal; Notable for the following components:   WBC 11.3 (*)    RBC 3.60 (*)    Hemoglobin 11.5 (*)    HCT 34.7 (*)    RDW 16.1 (*)    Platelets 132 (*)    Neutro Abs 10.0 (*)    Lymphs Abs 0.7 (*)    All other components within normal limits  TYPE AND SCREEN   ____________________________________________  EKG  EKG read and interpreted by me shows sinus rhythm rate of 87 normal axis left bundle-branch block with wider than usual limb lead QRS complexes chest leads show what appear to be higher than usual sTT waves. There is some ST elevation but that has been present before.inferiorly the T-wave inversion ST segment depression is new compared to last month ____________________________________________  RADIOLOGY  chest x-ray looks like CHF possibly a small pneumonia perhaps ____________________________________________   PROCEDURES  Procedure(s) performed:   Procedures  Critical Care performed:   ____________________________________________   INITIAL IMPRESSION / ASSESSMENT AND PLAN / ED COURSE  old EKGs reviewed and compared with patient's present EKG.    patient has a very large history for heart disease he has a abnormal and different EKG from previously his troponin is bumped we will put him in the hospital. Additionally we will be able to watch him and see what he is 10 with the hemoptysis or hematemesis. He has not had any of that in the emergency room. He has not been coughing since have seen him. He  is not running a fever. His white count is very elevated. We'll not treat him for pneumonia at this point.   ____________________________________________   FINAL CLINICAL IMPRESSION(S) / ED DIAGNOSES  Final diagnoses:  Chest pain, unspecified type  Abnormal EKG   abnormal chest x-ray  ED Discharge Orders    None       Note:  This document was prepared using Dragon voice recognition software and may include unintentional dictation errors.    Nena Polio, MD 10/28/17 2725    Nena Polio, MD 10/28/17 3664    Conni Slipper  F, MD 10/28/17 7972    Nena Polio, MD 10/28/17 [phone]

## 2017-10-28 NOTE — ED Triage Notes (Signed)
Pt c/o chest pain, extensive cardiac hx. Pt called EMS for the chest pain. Pt presently dyspneic. Pt states states chest pain all day, characterized as gas pain. Pt started having chest pain 1300. Pt vomiting frank blood at 2100. Pt states similar sxs w/ prior heart attacks.

## 2017-10-28 NOTE — ED Notes (Signed)
Date and time results received: 10/28/17 2306  Test: troponin Critical Value: 0.03  Name of Provider Notified: Dr Cinda Quest

## 2017-10-29 ENCOUNTER — Other Ambulatory Visit: Payer: Self-pay

## 2017-10-29 DIAGNOSIS — E860 Dehydration: Secondary | ICD-10-CM | POA: Diagnosis present

## 2017-10-29 DIAGNOSIS — E785 Hyperlipidemia, unspecified: Secondary | ICD-10-CM | POA: Diagnosis present

## 2017-10-29 DIAGNOSIS — Z7982 Long term (current) use of aspirin: Secondary | ICD-10-CM | POA: Diagnosis not present

## 2017-10-29 DIAGNOSIS — Z8546 Personal history of malignant neoplasm of prostate: Secondary | ICD-10-CM | POA: Diagnosis not present

## 2017-10-29 DIAGNOSIS — R9431 Abnormal electrocardiogram [ECG] [EKG]: Secondary | ICD-10-CM | POA: Diagnosis present

## 2017-10-29 DIAGNOSIS — D649 Anemia, unspecified: Secondary | ICD-10-CM | POA: Diagnosis present

## 2017-10-29 DIAGNOSIS — E119 Type 2 diabetes mellitus without complications: Secondary | ICD-10-CM | POA: Diagnosis present

## 2017-10-29 DIAGNOSIS — Z87891 Personal history of nicotine dependence: Secondary | ICD-10-CM | POA: Diagnosis not present

## 2017-10-29 DIAGNOSIS — C9000 Multiple myeloma not having achieved remission: Secondary | ICD-10-CM | POA: Diagnosis present

## 2017-10-29 DIAGNOSIS — Z7902 Long term (current) use of antithrombotics/antiplatelets: Secondary | ICD-10-CM | POA: Diagnosis not present

## 2017-10-29 DIAGNOSIS — Z888 Allergy status to other drugs, medicaments and biological substances status: Secondary | ICD-10-CM | POA: Diagnosis not present

## 2017-10-29 DIAGNOSIS — Z79899 Other long term (current) drug therapy: Secondary | ICD-10-CM | POA: Diagnosis not present

## 2017-10-29 DIAGNOSIS — I214 Non-ST elevation (NSTEMI) myocardial infarction: Secondary | ICD-10-CM | POA: Diagnosis present

## 2017-10-29 DIAGNOSIS — I252 Old myocardial infarction: Secondary | ICD-10-CM | POA: Diagnosis not present

## 2017-10-29 LAB — BASIC METABOLIC PANEL
ANION GAP: 8 (ref 5–15)
BUN: 26 mg/dL — ABNORMAL HIGH (ref 6–20)
CALCIUM: 8.9 mg/dL (ref 8.9–10.3)
CO2: 24 mmol/L (ref 22–32)
CREATININE: 1.03 mg/dL (ref 0.61–1.24)
Chloride: 105 mmol/L (ref 101–111)
GFR calc non Af Amer: >60 mL/min (ref >60)
Glucose, Bld: 135 mg/dL — ABNORMAL HIGH (ref 65–99)
Potassium: 4.4 mmol/L (ref 3.5–5.1)
SODIUM: 137 mmol/L (ref 135–145)

## 2017-10-29 LAB — GLUCOSE, CAPILLARY
GLUCOSE-CAPILLARY: 111 mg/dL — AB (ref 65–99)
GLUCOSE-CAPILLARY: 118 mg/dL — AB (ref 65–99)
Glucose-Capillary: 97 mg/dL (ref 65–99)
Glucose-Capillary: 121 mg/dL — ABNORMAL HIGH (ref 65–99)

## 2017-10-29 LAB — TROPONIN I
TROPONIN I: 0.14 ng/mL — AB (ref <0.03)
TROPONIN I: 0.23 ng/mL — AB (ref <0.03)
TROPONIN I: 0.26 ng/mL — AB (ref <0.03)
Troponin I: 0.28 ng/mL (ref <0.03)

## 2017-10-29 LAB — CBC
HCT: 31.1 % — ABNORMAL LOW (ref 40.0–52.0)
HEMOGLOBIN: 10.8 g/dL — AB (ref 13.0–18.0)
MCH: 32.8 pg (ref 26.0–34.0)
MCHC: 34.6 g/dL (ref 32.0–36.0)
MCV: 94.8 fL (ref 80.0–100.0)
Platelets: 114 10*3/uL — ABNORMAL LOW (ref 150–440)
RBC: 3.28 MIL/uL — AB (ref 4.40–5.90)
RDW: 15.5 % — ABNORMAL HIGH (ref 11.5–14.5)
WBC: 13 10*3/uL — AB (ref 3.8–10.6)

## 2017-10-29 LAB — PROTIME-INR
INR: 1.13
PROTHROMBIN TIME: 14.4 s (ref 11.4–15.2)

## 2017-10-29 LAB — HEPARIN LEVEL (UNFRACTIONATED)
HEPARIN UNFRACTIONATED: 0.63 [IU]/mL (ref 0.30–0.70)
Heparin Unfractionated: 0.53 IU/mL (ref 0.30–0.70)

## 2017-10-29 LAB — APTT: APTT: 26 s (ref 24–36)

## 2017-10-29 MED ORDER — PRAVASTATIN SODIUM 20 MG PO TABS
20.0000 mg | ORAL_TABLET | Freq: Every day | ORAL | Status: DC
  Administered 2017-10-29 – 2017-10-30 (×2): 20 mg via ORAL
  Filled 2017-10-29 (×2): qty 1

## 2017-10-29 MED ORDER — ENOXAPARIN SODIUM 40 MG/0.4ML ~~LOC~~ SOLN: 40.0000 mg | SUBCUTANEOUS | Status: DC

## 2017-10-29 MED ORDER — ACETAMINOPHEN 650 MG RE SUPP: 650.0000 mg | Freq: Four times a day (QID) | RECTAL | Status: DC | PRN

## 2017-10-29 MED ORDER — INSULIN ASPART 100 UNIT/ML ~~LOC~~ SOLN: 0.0000 [IU] | Freq: Four times a day (QID) | SUBCUTANEOUS | Status: DC

## 2017-10-29 MED ORDER — FUROSEMIDE 40 MG PO TABS
20.0000 mg | ORAL_TABLET | Freq: Every day | ORAL | Status: DC
  Administered 2017-10-29: 20 mg via ORAL
  Filled 2017-10-29: qty 1

## 2017-10-29 MED ORDER — ACYCLOVIR 200 MG PO CAPS
200.0000 mg | ORAL_CAPSULE | Freq: Two times a day (BID) | ORAL | Status: DC
  Administered 2017-10-29 – 2017-10-30 (×4): 200 mg via ORAL
  Filled 2017-10-29 (×6): qty 1

## 2017-10-29 MED ORDER — INSULIN ASPART 100 UNIT/ML ~~LOC~~ SOLN: 0.0000 [IU] | Freq: Every day | SUBCUTANEOUS | Status: DC

## 2017-10-29 MED ORDER — LISINOPRIL 5 MG PO TABS
2.5000 mg | ORAL_TABLET | Freq: Every day | ORAL | Status: DC
  Administered 2017-10-29: 2.5 mg via ORAL
  Filled 2017-10-29 (×2): qty 1

## 2017-10-29 MED ORDER — INSULIN ASPART 100 UNIT/ML ~~LOC~~ SOLN
0.0000 [IU] | Freq: Three times a day (TID) | SUBCUTANEOUS | Status: DC
  Administered 2017-10-29 – 2017-10-30 (×3): 1 [IU] via SUBCUTANEOUS
  Administered 2017-10-31: 2 [IU] via SUBCUTANEOUS
  Filled 2017-10-29 (×4): qty 1

## 2017-10-29 MED ORDER — OXYCODONE HCL 5 MG PO TABS: 5.0000 mg | ORAL_TABLET | ORAL | Status: DC | PRN

## 2017-10-29 MED ORDER — ACETAMINOPHEN 325 MG PO TABS: 650.0000 mg | ORAL_TABLET | Freq: Four times a day (QID) | ORAL | Status: DC | PRN

## 2017-10-29 MED ORDER — HEPARIN BOLUS VIA INFUSION
4000.0000 [IU] | Freq: Once | INTRAVENOUS | Status: AC
  Administered 2017-10-29: 4000 [IU] via INTRAVENOUS
  Filled 2017-10-29: qty 4000

## 2017-10-29 MED ORDER — HEPARIN (PORCINE) IN NACL 100-0.45 UNIT/ML-% IJ SOLN
1050.0000 [IU]/h | INTRAMUSCULAR | Status: DC
  Administered 2017-10-29 – 2017-10-31 (×2): 900 [IU]/h via INTRAVENOUS
  Filled 2017-10-29 (×3): qty 250

## 2017-10-29 MED ORDER — CARVEDILOL 3.125 MG PO TABS
3.2500 mg | ORAL_TABLET | Freq: Two times a day (BID) | ORAL | Status: DC
  Administered 2017-10-29 – 2017-10-30 (×3): 3.125 mg via ORAL
  Filled 2017-10-29 (×4): qty 1

## 2017-10-29 MED ORDER — FINASTERIDE 5 MG PO TABS
2.5000 mg | ORAL_TABLET | Freq: Every day | ORAL | Status: DC
  Administered 2017-10-29 – 2017-10-30 (×2): 2.5 mg via ORAL
  Filled 2017-10-29 (×2): qty 1

## 2017-10-29 MED ORDER — ONDANSETRON HCL 4 MG PO TABS: 4.0000 mg | ORAL_TABLET | Freq: Four times a day (QID) | ORAL | Status: DC | PRN

## 2017-10-29 MED ORDER — ONDANSETRON HCL 4 MG/2ML IJ SOLN: 4.0000 mg | Freq: Four times a day (QID) | INTRAMUSCULAR | Status: DC | PRN

## 2017-10-29 MED ORDER — ASPIRIN EC 81 MG PO TBEC
81.0000 mg | DELAYED_RELEASE_TABLET | Freq: Every day | ORAL | Status: DC
  Administered 2017-10-29 – 2017-10-30 (×2): 81 mg via ORAL
  Filled 2017-10-29 (×2): qty 1

## 2017-10-29 MED ORDER — CLOPIDOGREL BISULFATE 75 MG PO TABS
75.0000 mg | ORAL_TABLET | Freq: Every day | ORAL | Status: DC
  Administered 2017-10-29 – 2017-10-30 (×2): 75 mg via ORAL
  Filled 2017-10-29 (×2): qty 1

## 2017-10-29 NOTE — Plan of Care (Signed)
  Health Behavior/Discharge Planning: Ability to manage health-related needs will improve 10/29/2017 1515 - Progressing by Daron Offer, RN Note Patient for a possible cardiac catheterization procedure on Monday morning. Will continue to monitor progress. Wenda Low [facility]

## 2017-10-29 NOTE — Progress Notes (Signed)
[place] [facility] [place] NAME: Dwayne Terry    MR#:  [id number]  DATE OF BIRTH:  [birth year]  SUBJECTIVE:  CHIEF COMPLAINT:   Chief Complaint  Patient presents with  . Chest Pain   No chest pain, nausea or diaphoresis. On heparin drip. REVIEW OF SYSTEMS:  Review of Systems  Constitutional: Negative for chills, fever and malaise/fatigue.  HENT: Negative for sore throat.   Eyes: Negative for blurred vision and double vision.  Respiratory: Negative for cough, hemoptysis, shortness of breath, wheezing and stridor.   Cardiovascular: Negative for chest pain, palpitations, orthopnea and leg swelling.  Gastrointestinal: Negative for abdominal pain, blood in stool, diarrhea, melena, nausea and vomiting.  Genitourinary: Negative for dysuria, flank pain and hematuria.  Musculoskeletal: Negative for back pain and joint pain.  Neurological: Negative for dizziness, sensory change, focal weakness, seizures, loss of consciousness, weakness and headaches.  Endo/Heme/Allergies: Negative for polydipsia.  Psychiatric/Behavioral: Negative for depression. The patient is not nervous/anxious.     DRUG ALLERGIES:   Allergies  Allergen Reactions  . Antihistamines, Chlorpheniramine-Type Other (See Comments)    Reaction: unknown  . Nitroglycerin Other (See Comments)    Reaction: hypotension   VITALS:  Blood pressure (!) 108/52, pulse (!) 56, temperature (!) 97.5 F (36.4 C), temperature source Oral, resp. rate 14, height 5\' 8"  (1.727 m), weight 161 lb 12.8 oz (73.4 kg), SpO2 97 %. PHYSICAL EXAMINATION:  Physical Exam  Constitutional: He is oriented to person, place, and time and well-developed, well-nourished, and in no distress.  HENT:  Head: Normocephalic.  Mouth/Throat: Oropharynx is clear and moist.  Eyes: Conjunctivae and EOM are normal. Pupils are equal, round, and reactive to light. No scleral icterus.  Neck: Normal range of motion. Neck supple. No  JVD present. No tracheal deviation present.  Cardiovascular: Normal rate, regular rhythm and normal heart sounds. Exam reveals no gallop.  No murmur heard. Pulmonary/Chest: Effort normal and breath sounds normal. No respiratory distress. He has no wheezes. He has no rales.  Abdominal: Soft. Bowel sounds are normal. He exhibits no distension. There is no tenderness. There is no rebound.  Musculoskeletal: Normal range of motion. He exhibits no edema or tenderness.  Neurological: He is alert and oriented to person, place, and time. No cranial nerve deficit.  Skin: No rash noted. No erythema.  Psychiatric: Affect normal.   LABORATORY PANEL:  Male CBC Recent Labs  Lab 10/29/17 0332  WBC 13.0*  HGB 10.8*  HCT 31.1*  PLT 114*   ------------------------------------------------------------------------------------------------------------------ Chemistries  Recent Labs  Lab 10/28/17 2223 10/29/17 0332  NA 135 137  K 4.4 4.4  CL 106 105  CO2 18* 24  GLUCOSE 265* 135*  BUN 27* 26*  CREATININE 1.08 1.03  CALCIUM 8.8* 8.9  AST 30  --   ALT 24  --   ALKPHOS 73  --   BILITOT 0.6  --    RADIOLOGY:  Dg Chest Portable 1 View  Result Date: 10/28/2017 CLINICAL DATA:  Chest pain.  Cardiac history. EXAM: PORTABLE CHEST 1 VIEW COMPARISON:  September 16, 2017 FINDINGS: The left chest is obscured by a transcutaneous pacer. Interstitial prominence bilaterally is mildly increased and a little more focal in the left base. The cardiomediastinal silhouette is stable. No other interval changes or acute abnormalities. IMPRESSION: Interstitial opacities, left greater than right, worsened in the interval are concerning for edema. The opacity is more focal in the left base and a superimposed infection is not  excluded on this study. Electronically Signed   By: Dorise Bullion III M.D   On: 10/28/2017 22:47   ASSESSMENT AND PLAN:   NSTEMI.  cardiac enzymes trending up, on heparin drip.  Aspirin, Plavix and  statin, consider cardiac cath on Monday per Dr. Ubaldo Glassing.  Dehydration.  Hold Lasix, follow-up BMP.   Diabetes ([place]) -sliding scale insulin with corresponding glucose checks   HLD (hyperlipidemia) -home dose statin  Discussed with Dr. Ubaldo Glassing. All the records are reviewed and case discussed with Care Management/Social Worker. Management plans discussed with the patient, his daughter and they are in agreement.  CODE STATUS: Full Code  TOTAL TIME TAKING CARE OF THIS PATIENT:38 minutes.   More than 50% of the time was spent in counseling/coordination of care: YES  POSSIBLE D/C IN 2 DAYS, DEPENDING ON CLINICAL CONDITION.   Demetrios Loll M.D on 10/29/2017 at 2:26 PM  Between 7am to 6pm - Pager - [phone]  After 6pm go to [email or web address] - [facility]

## 2017-10-29 NOTE — Progress Notes (Signed)
ANTICOAGULATION CONSULT NOTE - Initial Consult  Pharmacy Consult for heparin drip Indication: chest pain/ACS  Allergies  Allergen Reactions  . Antihistamines, Chlorpheniramine-Type Other (See Comments)    Reaction: unknown  . Nitroglycerin Other (See Comments)    Reaction: hypotension    Patient Measurements: Height: 5\' 8"  (172.7 cm) Weight: 161 lb 12.8 oz (73.4 kg) IBW/kg (Calculated) : 68.4 Heparin Dosing Weight: 72 kg  Vital Signs: Temp: 97.5 F (36.4 C) (12/01 1206) Temp Source: Oral (12/01 1206) BP: 108/52 (12/01 1206) Pulse Rate: 56 (12/01 1206)  Labs: Recent Labs    10/28/17 2223  10/29/17 0332 10/29/17 0418 10/29/17 0815 10/29/17 1327 10/29/17 1329  HGB 11.5*  --  10.8*  --   --   --   --   HCT 34.7*  --  31.1*  --   --   --   --   PLT 132*  --  114*  --   --   --   --   APTT  --   --   --  26  --   --   --   LABPROT  --   --   --  14.4  --   --   --   INR  --   --   --  1.13  --   --   --   HEPARINUNFRC  --   --   --   --   --  0.63  --   CREATININE 1.08  --  1.03  --   --   --   --   TROPONINI 0.03*   < > 0.23*  --  0.26*  --  0.28*   < > = values in this interval not displayed.    Estimated Creatinine Clearance: 50.7 mL/min (by C-G formula based on SCr of 1.03 mg/dL).   Medical History: Past Medical History:  Diagnosis Date  . Cancer ([place])    bone marrow, prostate cancer  . Diabetes mellitus without complication ([place])   . Hyperlipidemia   . MI (mitral incompetence)   . MI (myocardial infarction) ([place])     Medications:  No anticoagulation in PTA meds  Assessment: 83 yo male here with chest pain, hx of CAD.  Goal of Therapy:  Heparin level 0.3-0.7 units/ml Monitor platelets by anticoagulation protocol: Yes   Plan:  4000 unit bolus and initial rate of 900 units/hr. First heparin level 8 hours after start of infusion.  12/1 1327 heparin level (drawn late) 0.63, therapeutic. RN confirms heparin running at 9 ml/hr, no s/sx of bleeding  noted. Continue at current rate. Will recheck heparin level in 8h to confirm. CBC in AM.   Rayna Sexton L 10/29/2017,2:17 PM

## 2017-10-29 NOTE — Progress Notes (Signed)
ANTICOAGULATION CONSULT NOTE - Initial Consult  Pharmacy Consult for heparin drip Indication: chest pain/ACS  Allergies  Allergen Reactions  . Antihistamines, Chlorpheniramine-Type Other (See Comments)    Reaction: unknown  . Nitroglycerin Other (See Comments)    Reaction: hypotension    Patient Measurements: Height: 5\' 8"  (172.7 cm) Weight: 161 lb 12.8 oz (73.4 kg) IBW/kg (Calculated) : 68.4 Heparin Dosing Weight: 73 kg  Vital Signs: Temp: 97.8 F (36.6 C) (12/01 0235) Temp Source: Oral (12/01 0235) BP: 141/71 (12/01 0235) Pulse Rate: 65 (12/01 0235)  Labs: Recent Labs    10/28/17 2223 10/29/17 0120 10/29/17 0332 10/29/17 0418  HGB 11.5*  --  10.8*  --   HCT 34.7*  --  31.1*  --   PLT 132*  --  114*  --   APTT  --   --   --  26  LABPROT  --   --   --  14.4  INR  --   --   --  1.13  CREATININE 1.08  --  1.03  --   TROPONINI 0.03* 0.14* 0.23*  --     Estimated Creatinine Clearance: 50.7 mL/min (by C-G formula based on SCr of 1.03 mg/dL).   Medical History: Past Medical History:  Diagnosis Date  . Cancer ([place])    bone marrow, prostate cancer  . Diabetes mellitus without complication ([place])   . Hyperlipidemia   . MI (mitral incompetence)   . MI (myocardial infarction) ([place])     Medications:  No anticoagulation in PTA meds  Assessment:  Goal of Therapy:  Heparin level 0.3-0.7 units/ml Monitor platelets by anticoagulation protocol: Yes   Plan:  4000 unit bolus and initial rate of 900 units/hr. First heparin level 8 hours after start of infusion.  Wessie Shanks S 10/29/2017,5:17 AM

## 2017-10-29 NOTE — H&P (Signed)
[facility] [place] NAME: Dwayne Terry    MR#:  [id number]  DATE OF BIRTH:  03-21-1930  DATE OF ADMISSION:  10/28/2017  PRIMARY CARE PHYSICIAN: Kirk Ruths, MD   REQUESTING/REFERRING PHYSICIAN: Cinda Quest, MD  CHIEF COMPLAINT:   Chief Complaint  Patient presents with  . Chest Pain    HISTORY OF PRESENT ILLNESS:  Dwayne Terry  is a 83 y.o. male who presents with chest pain.  Patient states that he has significant history of coronary artery disease with prior intervention.  He states that he has been told by Dr. Saralyn Pilar that he has an area of prior stenting that is not amenable to any further intervention.  His chest pain is currently resolved, but given his elevated troponin and his risk factors hospitalist were called for admission and further evaluation  PAST MEDICAL HISTORY:   Past Medical History:  Diagnosis Date  . Cancer ([place])    bone marrow, prostate cancer  . Diabetes mellitus without complication ([place])   . Hyperlipidemia   . MI (mitral incompetence)   . MI (myocardial infarction) ([place])     PAST SURGICAL HISTORY:   Past Surgical History:  Procedure Laterality Date  . AORTIC VALVE REPLACEMENT (AVR)/CORONARY ARTERY BYPASS GRAFTING (CABG)    . CORONARY ANGIOPLASTY WITH STENT PLACEMENT    . CORONARY BALLOON ANGIOPLASTY N/A 06/10/2017   Procedure: Coronary Balloon Angioplasty;  Surgeon: Wellington Hampshire, MD;  Location: [facility];  Service: Cardiovascular;  Laterality: N/A;  . CORONARY STENT INTERVENTION N/A 01/28/2017   Procedure: Coronary Stent Intervention;  Surgeon: [name], MD;  Location: [facility];  Service: Cardiovascular;  Laterality: N/A;  . CORONARY STENT INTERVENTION N/A 04/29/2017   Procedure: Coronary Stent Intervention;  Surgeon: [name], MD;  Location: [facility];  Service: Cardiovascular;  Laterality: N/A;  . CORONARY STENT INTERVENTION N/A  09/19/2017   Procedure: CORONARY/GRAFT ANGIOGRAPHY;  Surgeon: Corey Skains, MD;  Location: [facility];  Service: Cardiovascular;  Laterality: N/A;  . CORONARY STENT INTERVENTION N/A 09/19/2017   Procedure: CORONARY STENT INTERVENTION;  Surgeon: Wellington Hampshire, MD;  Location: [facility];  Service: Cardiovascular;  Laterality: N/A;  . LEFT HEART CATH AND CORONARY ANGIOGRAPHY N/A 01/28/2017   Procedure: Left Heart Cath and Coronary Angiography;  Surgeon: [name], MD;  Location: [facility];  Service: Cardiovascular;  Laterality: N/A;  . LEFT HEART CATH AND CORS/GRAFTS ANGIOGRAPHY N/A 04/29/2017   Procedure: Left Heart Cath and Cors/Grafts Angiography;  Surgeon: [name], MD;  Location: [facility];  Service: Cardiovascular;  Laterality: N/A;  . LEFT HEART CATH AND CORS/GRAFTS ANGIOGRAPHY N/A 06/10/2017   Procedure: Left Heart Cath and Cors/Grafts Angiography;  Surgeon: Corey Skains, MD;  Location: [facility];  Service: Cardiovascular;  Laterality: N/A;  . LEFT HEART CATH AND CORS/GRAFTS ANGIOGRAPHY N/A 09/19/2017   Procedure: LEFT HEART CATH;  Surgeon: Corey Skains, MD;  Location: [place] [facility];  Service: Cardiovascular;  Laterality: N/A;    SOCIAL HISTORY:   Social History   Tobacco Use  . Smoking status: Former Research scientist (life sciences)  . Smokeless tobacco: Former Network engineer Use Topics  . Alcohol use: No    FAMILY HISTORY:   Family History  Problem Relation Age of Onset  . Heart disease Other     DRUG ALLERGIES:   Allergies  Allergen Reactions  . Antihistamines, Chlorpheniramine-Type Other (  See Comments)    Reaction: unknown  . Nitroglycerin Other (See Comments)    Reaction: hypotension    MEDICATIONS AT HOME:   Prior to Admission medications   Medication Sig Start Date End Date Taking? Authorizing Provider  acetaminophen (TYLENOL) 325 MG tablet Take 650 mg by mouth every 6 (six) hours as  needed.   Yes [provider]  acyclovir (ZOVIRAX) 200 MG capsule Take 200 mg by mouth 2 (two) times daily.   Yes [provider]  aspirin EC 81 MG tablet Take 1 tablet (81 mg total) by mouth daily. 01/29/17  Yes Loletha Grayer, MD  carvedilol (COREG) 6.25 MG tablet Take 1 tablet (6.25 mg total) by mouth 2 (two) times daily with a meal. Patient taking differently: Take 3.25 mg by mouth 2 (two) times daily with a meal.  01/29/17  Yes Leslye Peer, Richard, MD  clopidogrel (PLAVIX) 75 MG tablet Take 1 tablet (75 mg total) by mouth daily with breakfast. 04/30/17  Yes Sainani, Belia Heman, MD  dexamethasone (DECADRON) 2 MG tablet Take 8.5 mg by mouth See admin instructions. Take 8.5 mg on Thursday and Friday.   Yes [provider]  finasteride (PROSCAR) 5 MG tablet Take 2.5 mg by mouth daily.    Yes [provider]  furosemide (LASIX) 20 MG tablet Take 1 tablet (20 mg total) by mouth daily. 09/20/17 09/20/18 Yes Dustin Flock, MD  lisinopril (ZESTRIL) 2.5 MG tablet Take 1 tablet (2.5 mg total) by mouth daily. 09/20/17 09/20/18 Yes Dustin Flock, MD  lovastatin (MEVACOR) 20 MG tablet Take 20 mg by mouth daily at 6 PM.   Yes [provider]  metFORMIN (GLUCOPHAGE) 500 MG tablet Take 500 mg by mouth 2 (two) times daily with a meal.   Yes [provider]  Multiple Vitamin (MULTI-VITAMINS) TABS Take 1 tablet by mouth daily.   Yes [provider]  potassium chloride SA (K-DUR,KLOR-CON) 20 MEQ tablet Take 1 tablet (20 mEq total) by mouth daily. 09/20/17  Yes Dustin Flock, MD  silver sulfADIAZINE (SILVADENE) 1 % cream Apply to affected area daily Patient not taking: Reported on 09/16/2017 08/03/17 08/03/18  Duanne Guess, PA-C    REVIEW OF SYSTEMS:  Review of Systems  Constitutional: Negative for chills, fever, malaise/fatigue and weight loss.  HENT: Negative for ear pain, hearing loss and tinnitus.   Eyes: Negative for blurred vision, double vision,  pain and redness.  Respiratory: Positive for shortness of breath. Negative for cough and hemoptysis.   Cardiovascular: Positive for chest pain. Negative for palpitations, orthopnea and leg swelling.  Gastrointestinal: Positive for nausea. Negative for abdominal pain, constipation, diarrhea and vomiting.  Genitourinary: Negative for dysuria, frequency and hematuria.  Musculoskeletal: Negative for back pain, joint pain and neck pain.  Skin:       No acne, rash, or lesions  Neurological: Negative for dizziness, tremors, focal weakness and weakness.  Endo/Heme/Allergies: Negative for polydipsia. Does not bruise/bleed easily.  Psychiatric/Behavioral: Negative for depression. The patient is not nervous/anxious and does not have insomnia.      VITAL SIGNS:   Vitals:   10/28/17 2212 10/28/17 2245 10/28/17 2330 10/28/17 2345  BP:  (!) 110/57 112/60 103/62  Pulse:  69 66 67  Resp:  (!) 26 20 20   Temp:      TempSrc:      SpO2:  92% 92% 93%  Weight: 72.1 kg (159 lb)     Height: 5\' 8"  (1.727 m)      Wt Readings  from Last 3 Encounters:  10/28/17 72.1 kg (159 lb)  09/20/17 69.7 kg (153 lb 9.6 oz)  08/03/17 75.8 kg (167 lb)    PHYSICAL EXAMINATION:  Physical Exam  Vitals reviewed. Constitutional: He is oriented to person, place, and time. He appears well-developed and well-nourished. No distress.  HENT:  Head: Normocephalic and atraumatic.  Mouth/Throat: Oropharynx is clear and moist.  Eyes: Conjunctivae and EOM are normal. Pupils are equal, round, and reactive to light. No scleral icterus.  Neck: Normal range of motion. Neck supple. No JVD present. No thyromegaly present.  Cardiovascular: Normal rate, regular rhythm and intact distal pulses. Exam reveals no gallop and no friction rub.  No murmur heard. Respiratory: Effort normal and breath sounds normal. No respiratory distress. He has no wheezes. He has no rales.  GI: Soft. Bowel sounds are normal. He exhibits no distension. There is no  tenderness.  Musculoskeletal: Normal range of motion. He exhibits no edema.  No arthritis, no gout  Lymphadenopathy:    He has no cervical adenopathy.  Neurological: He is alert and oriented to person, place, and time. No cranial nerve deficit.  No dysarthria, no aphasia  Skin: Skin is warm and dry. No rash noted. No erythema.  Psychiatric: He has a normal mood and affect. His behavior is normal. Judgment and thought content normal.    LABORATORY PANEL:   CBC Recent Labs  Lab 10/28/17 2223  WBC 11.3*  HGB 11.5*  HCT 34.7*  PLT 132*   ------------------------------------------------------------------------------------------------------------------  Chemistries  Recent Labs  Lab 10/28/17 2223  NA 135  K 4.4  CL 106  CO2 18*  GLUCOSE 265*  BUN 27*  CREATININE 1.08  CALCIUM 8.8*  AST 30  ALT 24  ALKPHOS 73  BILITOT 0.6   ------------------------------------------------------------------------------------------------------------------  Cardiac Enzymes Recent Labs  Lab 10/28/17 2223  TROPONINI 0.03*   ------------------------------------------------------------------------------------------------------------------  RADIOLOGY:  Dg Chest Portable 1 View  Result Date: 10/28/2017 CLINICAL DATA:  Chest pain.  Cardiac history. EXAM: PORTABLE CHEST 1 VIEW COMPARISON:  September 16, 2017 FINDINGS: The left chest is obscured by a transcutaneous pacer. Interstitial prominence bilaterally is mildly increased and a little more focal in the left base. The cardiomediastinal silhouette is stable. No other interval changes or acute abnormalities. IMPRESSION: Interstitial opacities, left greater than right, worsened in the interval are concerning for edema. The opacity is more focal in the left base and a superimposed infection is not excluded on this study. Electronically Signed   By: Dorise Bullion III M.D   On: 10/28/2017 22:47    EKG:   Orders placed or performed during the  hospital encounter of 10/28/17  . ED EKG  . ED EKG    IMPRESSION AND PLAN:  Principal Problem:   Chest pain -cycle cardiac enzymes, if they rise significantly will start heparin drip.  Get a cardiology consult Active Problems:   Diabetes ([place]) -sliding scale insulin with corresponding glucose checks   HLD (hyperlipidemia) -home dose statin  All the records are reviewed and case discussed with ED provider. Management plans discussed with the patient and/or family.  DVT PROPHYLAXIS: SubQ lovenox  GI PROPHYLAXIS: None  ADMISSION STATUS: Observation  CODE STATUS: Full Code Status History    Date Active Date Inactive Code Status Order ID Comments User Context   09/17/2017 00:58 09/20/2017 15:24 Full Code [id number]  Lance Coon, MD Inpatient   06/10/2017 03:24 06/11/2017 16:13 Full Code [id number]  Harrie Foreman, MD Inpatient   04/29/2017 05:12 04/30/2017 14:37  Full Code [id number]  Harrie Foreman, MD Inpatient   01/27/2017 23:56 01/29/2017 14:30 Full Code [id number]  Harvie Bridge, DO Inpatient    Advance Directive Documentation     Most Recent Value  Type of Advance Directive  Living will  Pre-existing out of facility DNR order (yellow form or pink MOST form)  No data  "MOST" Form in Place?  No data      TOTAL TIME TAKING CARE OF THIS PATIENT: 40 minutes.   Niza Soderholm [place] 10/29/2017, 1:26 AM  [employer]  [phone]  CC: Primary care physician; Kirk Ruths, MD  Note:  This document was prepared using Dragon voice recognition software and may include unintentional dictation errors.

## 2017-10-29 NOTE — Consult Note (Addendum)
[facility]  CARDIOLOGY CONSULT NOTE  Patient ID: Dwayne Terry MRN: [id number] DOB/AGE: 1930-08-21 83 y.o.  Admit date: 10/28/2017 Referring Physician Dr. Bridgett Larsson Primary Physician   Primary Cardiologist Dr. Saralyn Pilar Reason for Consultation [place]  HPI: Pt is an 83 yo male with history of multiple myeloma, , history of  cad s/p cabg in 12/13/96, history of hypertension, hyperlipidemia with history of frequent admissions with chest pain and nstemi diagnosis with multiple stents. He is now admitted with comnplaints of chest pain similar to his angina and has mild troponin elevation. The patient was hospitalized 01/27/2017 with chest pain, with minimal troponin elevation. He underwent cardiac catheterization on 01/28/2017 which revealed occluded proximal LAD, occluded ostial left circumflex, 95% stenosis proximal RCA, patent LIMA to LAD, patent SVG to D1. The patient underwent successful PCI, receiving a 3.0 x 15 mm bare-metal vision stent in the proximal RCA with an excellent angiographic result. Left ventriculography revealed normal left ventricular function. The patient was readmitted 04/29/2017 with non-ST elevation myocardial infarction, cardiac catheterization revealed 95% in-stent restenosis proximal RCA, underwent successful DES with 3.0 x 8 mm Xience Alpine stent, and PTCA of mid RCA lesion. The patient was again readmitted on 06/11/2017 for non-ST elevation myocardial infarction, underwent cardiac catheterization which revealed in-stent restenosis proximal and mid RCA, and underwent PTCA. The patient was again readmitted 09/15/2017 with non-STEMI and atrial fibrillation. He underwent a difficult and challenging PCI with 3.25 x 15 mm Xience Sierra stent mid RCA on 09/19/2017.Pt has had episodes of afib which have recently been treated with  Diona Fanti and plavix alone despite paroysmal afib due to cark stools with triple therapy and no recent afib on ekg or  telemetry. He now has recurrent chest pain similar to his previous angina. He states he is compliant with his meds. Troponin is mildly elevated to 0.28. He is currently pain free. EKG shows lbbb which is chronic. His chest pian has improved. He is coughing up small amounts of bloody sputum.   Review of Systems  Constitutional: Positive for malaise/fatigue.  HENT: Negative.   Eyes: Negative.   Respiratory: Positive for shortness of breath.   Cardiovascular: Positive for chest pain.  Gastrointestinal: Negative.   Genitourinary: Negative.   Musculoskeletal: Negative.   Skin: Negative.   Neurological: Positive for weakness.  Endo/Heme/Allergies: Bruises/bleeds easily.  Psychiatric/Behavioral: Negative.     Past Medical History:  Diagnosis Date  . Cancer ([place])    bone marrow, prostate cancer  . Diabetes mellitus without complication ([place])   . Hyperlipidemia   . MI (mitral incompetence)   . MI (myocardial infarction) ([place])     Family History  Problem Relation Age of Onset  . Heart disease Other     Social History   Socioeconomic History  . Marital status: Married    Spouse name: Not on file  . Number of children: Not on file  . Years of education: Not on file  . Highest education level: Not on file  Social Needs  . Financial resource strain: Not on file  . Food insecurity - worry: Not on file  . Food insecurity - inability: Not on file  . Transportation needs - medical: Not on file  . Transportation needs - non-medical: Not on file  Occupational History  . Not on file  Tobacco Use  . Smoking status: Former Research scientist (life sciences)  . Smokeless tobacco: Former Network engineer and Sexual Activity  . Alcohol use: No  .  Drug use: No  . Sexual activity: Not on file  Other Topics Concern  . Not on file  Social History Narrative  . Not on file    Past Surgical History:  Procedure Laterality Date  . AORTIC VALVE REPLACEMENT (AVR)/CORONARY ARTERY BYPASS GRAFTING (CABG)    . CORONARY  ANGIOPLASTY WITH STENT PLACEMENT    . CORONARY BALLOON ANGIOPLASTY N/A 06/10/2017   Procedure: Coronary Balloon Angioplasty;  Surgeon: Wellington Hampshire, MD;  Location: [facility];  Service: Cardiovascular;  Laterality: N/A;  . CORONARY STENT INTERVENTION N/A 01/28/2017   Procedure: Coronary Stent Intervention;  Surgeon: [name], MD;  Location: [facility];  Service: Cardiovascular;  Laterality: N/A;  . CORONARY STENT INTERVENTION N/A 04/29/2017   Procedure: Coronary Stent Intervention;  Surgeon: [name], MD;  Location: [facility];  Service: Cardiovascular;  Laterality: N/A;  . CORONARY STENT INTERVENTION N/A 09/19/2017   Procedure: CORONARY/GRAFT ANGIOGRAPHY;  Surgeon: Corey Skains, MD;  Location: [facility];  Service: Cardiovascular;  Laterality: N/A;  . CORONARY STENT INTERVENTION N/A 09/19/2017   Procedure: CORONARY STENT INTERVENTION;  Surgeon: Wellington Hampshire, MD;  Location: [facility];  Service: Cardiovascular;  Laterality: N/A;  . LEFT HEART CATH AND CORONARY ANGIOGRAPHY N/A 01/28/2017   Procedure: Left Heart Cath and Coronary Angiography;  Surgeon: [name], MD;  Location: [facility];  Service: Cardiovascular;  Laterality: N/A;  . LEFT HEART CATH AND CORS/GRAFTS ANGIOGRAPHY N/A 04/29/2017   Procedure: Left Heart Cath and Cors/Grafts Angiography;  Surgeon: [name], MD;  Location: [facility];  Service: Cardiovascular;  Laterality: N/A;  . LEFT HEART CATH AND CORS/GRAFTS ANGIOGRAPHY N/A 06/10/2017   Procedure: Left Heart Cath and Cors/Grafts Angiography;  Surgeon: Corey Skains, MD;  Location: [facility];  Service: Cardiovascular;  Laterality: N/A;  . LEFT HEART CATH AND CORS/GRAFTS ANGIOGRAPHY N/A 09/19/2017   Procedure: LEFT HEART CATH;  Surgeon: Corey Skains, MD;  Location: [facility];  Service: Cardiovascular;  Laterality: N/A;     Medications  Prior to Admission  Medication Sig Dispense Refill Last Dose  . acetaminophen (TYLENOL) 325 MG tablet Take 650 mg by mouth every 6 (six) hours as needed.   prn at prn  . acyclovir (ZOVIRAX) 200 MG capsule Take 200 mg by mouth 2 (two) times daily.   10/28/2017 at Unknown time  . aspirin EC 81 MG tablet Take 1 tablet (81 mg total) by mouth daily.   10/28/2017 at Unknown time  . carvedilol (COREG) 6.25 MG tablet Take 1 tablet (6.25 mg total) by mouth 2 (two) times daily with a meal. (Patient taking differently: Take 3.25 mg by mouth 2 (two) times daily with a meal. ) 60 tablet 0 10/28/2017 at Unknown time  . clopidogrel (PLAVIX) 75 MG tablet Take 1 tablet (75 mg total) by mouth daily with breakfast. 30 tablet 1 10/28/2017 at Unknown time  . dexamethasone (DECADRON) 2 MG tablet Take 8.5 mg by mouth See admin instructions. Take 8.5 mg on Thursday and Friday.   10/28/2017 at Unknown time  . finasteride (PROSCAR) 5 MG tablet Take 2.5 mg by mouth daily.    10/28/2017 at Unknown time  . furosemide (LASIX) 20 MG tablet Take 1 tablet (20 mg total) by mouth daily. 30 tablet 11 10/28/2017 at Unknown time  . lisinopril (ZESTRIL) 2.5 MG tablet Take 1 tablet (2.5 mg total) by mouth daily. 30 tablet 11 10/28/2017 at  Unknown time  . lovastatin (MEVACOR) 20 MG tablet Take 20 mg by mouth daily at 6 PM.   10/28/2017 at Unknown time  . metFORMIN (GLUCOPHAGE) 500 MG tablet Take 500 mg by mouth 2 (two) times daily with a meal.   10/28/2017 at Unknown time  . Multiple Vitamin (MULTI-VITAMINS) TABS Take 1 tablet by mouth daily.   10/28/2017 at Unknown time  . potassium chloride SA (K-DUR,KLOR-CON) 20 MEQ tablet Take 1 tablet (20 mEq total) by mouth daily. 30 tablet 0 10/28/2017 at Unknown time  . silver sulfADIAZINE (SILVADENE) 1 % cream Apply to affected area daily (Patient not taking: Reported on 09/16/2017) 50 g 1 Not Taking at Unknown time    Physical Exam: Blood pressure (!) 108/52, pulse (!) 56, temperature (!) 97.5  F (36.4 C), temperature source Oral, resp. rate 14, height '5\' 8"'$  (1.727 m), weight 73.4 kg (161 lb 12.8 oz), SpO2 97 %.   Wt Readings from Last 1 Encounters:  10/29/17 73.4 kg (161 lb 12.8 oz)     General appearance: alert and cooperative Resp: clear to auscultation bilaterally Chest wall: no tenderness Cardio: regular rate and rhythm GI: soft, non-tender; bowel sounds normal; no masses,  no organomegaly Extremities: extremities normal, atraumatic, no cyanosis or edema Pulses: 2+ and symmetric Neurologic: Grossly normal  Labs:   Lab Results  Component Value Date   WBC 13.0 (H) 10/29/2017   HGB 10.8 (L) 10/29/2017   HCT 31.1 (L) 10/29/2017   MCV 94.8 10/29/2017   PLT 114 (L) 10/29/2017    Recent Labs  Lab 10/28/17 2223 10/29/17 0332  NA 135 137  K 4.4 4.4  CL 106 105  CO2 18* 24  BUN 27* 26*  CREATININE 1.08 1.03  CALCIUM 8.8* 8.9  PROT 6.9  --   BILITOT 0.6  --   ALKPHOS 73  --   ALT 24  --   AST 30  --   GLUCOSE 265* 135*   Lab Results  Component Value Date   CKTOTAL 600 (H) 07/08/2013   CKMB 44.7 (H) 07/08/2013   TROPONINI 0.28 ([place]) 10/29/2017      Radiology: Interstitial opacities, left greater than the right.  EKG: ar with lbbb  ASSESSMENT AND PLAN:  Pt with history of cad s/p cabg with lima to lad and  and svg to D1. He has had multiple stents in the rca with instent restenosis in the past. Now with chest pian that he feels is similar. Has mild troponin elevation. Will continue with asa, plavix and out patient meds and consdier left heart cath on Monday.  Signed: Teodoro Spray MD, [facility] 10/29/2017, 3:47 PM

## 2017-10-29 NOTE — Progress Notes (Signed)
Patient is admitted to room 247 with the diagnosis of chest pain. Allert and oriented x 4 . Denied any acute pain at this moment. The patient is oriented to his room.  Ascom/call bell. Fall risk assessed, bed alarm activated and the bed is in the lowest position. Will continue to monitor.

## 2017-10-29 NOTE — Progress Notes (Addendum)
ANTICOAGULATION CONSULT NOTE - Initial Consult  Pharmacy Consult for heparin drip Indication: chest pain/ACS  Allergies  Allergen Reactions  . Antihistamines, Chlorpheniramine-Type Other (See Comments)    Reaction: unknown  . Nitroglycerin Other (See Comments)    Reaction: hypotension    Patient Measurements: Height: 5\' 8"  (172.7 cm) Weight: 161 lb 12.8 oz (73.4 kg) IBW/kg (Calculated) : 68.4 Heparin Dosing Weight: 72 kg  Vital Signs: Temp: 98 F (36.7 C) (12/01 2110) Temp Source: Oral (12/01 2110) BP: 110/50 (12/01 2110) Pulse Rate: 61 (12/01 2110)  Labs: Recent Labs    10/28/17 2223  10/29/17 0332 10/29/17 0418 10/29/17 0815 10/29/17 1327 10/29/17 1329 10/29/17 2049  HGB 11.5*  --  10.8*  --   --   --   --   --   HCT 34.7*  --  31.1*  --   --   --   --   --   PLT 132*  --  114*  --   --   --   --   --   APTT  --   --   --  26  --   --   --   --   LABPROT  --   --   --  14.4  --   --   --   --   INR  --   --   --  1.13  --   --   --   --   HEPARINUNFRC  --   --   --   --   --  0.63  --  0.53  CREATININE 1.08  --  1.03  --   --   --   --   --   TROPONINI 0.03*   < > 0.23*  --  0.26*  --  0.28*  --    < > = values in this interval not displayed.    Estimated Creatinine Clearance: 50.7 mL/min (by C-G formula based on SCr of 1.03 mg/dL).   Medical History: Past Medical History:  Diagnosis Date  . Cancer ([place])    bone marrow, prostate cancer  . Diabetes mellitus without complication ([place])   . Hyperlipidemia   . MI (mitral incompetence)   . MI (myocardial infarction) ([place])     Medications:  No anticoagulation in PTA meds  Assessment: 83 yo male here with chest pain, hx of CAD.  Goal of Therapy:  Heparin level 0.3-0.7 units/ml Monitor platelets by anticoagulation protocol: Yes   Plan:  4000 unit bolus and initial rate of 900 units/hr. First heparin level 8 hours after start of infusion.  12/1 1327 heparin level (drawn late) 0.63, therapeutic. RN  confirms heparin running at 9 ml/hr, no s/sx of bleeding noted. Continue at current rate. Will recheck heparin level in 8h to confirm. CBC in AM.   12/1 2114 HL therapeutic x 2. Continue current rate. Will continue to check daily HL/CBC.  Laural Benes, Pharm.D., BCPS Clinical Pharmacist 10/29/2017,9:35 PM    12/2 heparin level 0.50. Continue current regimen. Recheck heparin level and CBC with tomorrow AM labs.  Sim Boast, PharmD, BCPS  10/30/17 6:58 AM

## 2017-10-30 LAB — GLUCOSE, CAPILLARY
GLUCOSE-CAPILLARY: 139 mg/dL — AB (ref 65–99)
Glucose-Capillary: 139 mg/dL — ABNORMAL HIGH (ref 65–99)
Glucose-Capillary: 106 mg/dL — ABNORMAL HIGH (ref 65–99)
Glucose-Capillary: 94 mg/dL (ref 65–99)

## 2017-10-30 LAB — OCCULT BLOOD X 1 CARD TO LAB, STOOL: Fecal Occult Bld: NEGATIVE

## 2017-10-30 LAB — CBC
HEMATOCRIT: 28.5 % — AB (ref 40.0–52.0)
HEMOGLOBIN: 9.8 g/dL — AB (ref 13.0–18.0)
MCH: 32.2 pg (ref 26.0–34.0)
MCHC: 34.3 g/dL (ref 32.0–36.0)
MCV: 94 fL (ref 80.0–100.0)
PLATELETS: 94 10*3/uL — AB (ref 150–440)
RBC: 3.03 MIL/uL — AB (ref 4.40–5.90)
RDW: 15.9 % — ABNORMAL HIGH (ref 11.5–14.5)
WBC: 8.6 10*3/uL (ref 3.8–10.6)

## 2017-10-30 LAB — BASIC METABOLIC PANEL
Anion gap: 7 (ref 5–15)
BUN: 25 mg/dL — AB (ref 6–20)
CHLORIDE: 105 mmol/L (ref 101–111)
CO2: 24 mmol/L (ref 22–32)
Calcium: 8.8 mg/dL — ABNORMAL LOW (ref 8.9–10.3)
Creatinine, Ser: 0.78 mg/dL (ref 0.61–1.24)
GFR calc Af Amer: >60 mL/min (ref >60)
GFR calc non Af Amer: >60 mL/min (ref >60)
GLUCOSE: 98 mg/dL (ref 65–99)
POTASSIUM: 3.7 mmol/L (ref 3.5–5.1)
Sodium: 136 mmol/L (ref 135–145)

## 2017-10-30 LAB — HEPARIN LEVEL (UNFRACTIONATED): Heparin Unfractionated: 0.5 IU/mL (ref 0.30–0.70)

## 2017-10-30 MED ORDER — GLUCERNA SHAKE PO LIQD
237.0000 mL | Freq: Two times a day (BID) | ORAL | Status: DC
  Administered 2017-10-30: 237 mL via ORAL

## 2017-10-30 MED ORDER — SODIUM CHLORIDE 0.9 % IV SOLN
INTRAVENOUS | Status: AC
  Administered 2017-10-30: 14:00:00 via INTRAVENOUS

## 2017-10-30 NOTE — Progress Notes (Signed)
[facility]  SUBJECTIVE: No further chest pain.  No further coughing up of blood.   Vitals:   10/29/17 2110 10/30/17 0516 10/30/17 0819 10/30/17 0909  BP: (!) 110/50 (!) 113/48 (!) 129/54 (!) 101/51  Pulse: 61 63 64 73  Resp: 17 17    Temp: 98 F (36.7 C) 98.3 F (36.8 C) 97.9 F (36.6 C) 98.3 F (36.8 C)  TempSrc: Oral Oral Oral Oral  SpO2: 93% 92% 95% 95%  Weight:      Height:        Intake/Output Summary (Last 24 hours) at 10/30/2017 0920 Last data filed at 10/30/2017 6606 Gross per 24 hour  Intake 720 ml  Output 925 ml  Net -205 ml    LABS: Basic Metabolic Panel: Recent Labs    10/29/17 0332 10/30/17 0527  NA 137 136  K 4.4 3.7  CL 105 105  CO2 24 24  GLUCOSE 135* 98  BUN 26* 25*  CREATININE 1.03 0.78  CALCIUM 8.9 8.8*   Liver Function Tests: Recent Labs    10/28/17 2223  AST 30  ALT 24  ALKPHOS 73  BILITOT 0.6  PROT 6.9  ALBUMIN 3.6   No results for input(s): LIPASE, AMYLASE in the last 72 hours. CBC: Recent Labs    10/28/17 2223 10/29/17 0332 10/30/17 0527  WBC 11.3* 13.0* 8.6  NEUTROABS 10.0*  --   --   HGB 11.5* 10.8* 9.8*  HCT 34.7* 31.1* 28.5*  MCV 96.5 94.8 94.0  PLT 132* 114* 94*   Cardiac Enzymes: Recent Labs    10/29/17 0332 10/29/17 0815 10/29/17 1329  TROPONINI 0.23* 0.26* 0.28*   BNP: Invalid input(s): POCBNP D-Dimer: No results for input(s): DDIMER in the last 72 hours. Hemoglobin A1C: No results for input(s): HGBA1C in the last 72 hours. Fasting Lipid Panel: No results for input(s): CHOL, HDL, LDLCALC, TRIG, CHOLHDL, LDLDIRECT in the last 72 hours. Thyroid Function Tests: No results for input(s): TSH, T4TOTAL, T3FREE, THYROIDAB in the last 72 hours.  Invalid input(s): FREET3 Anemia Panel: No results for input(s): VITAMINB12, FOLATE, FERRITIN, TIBC, IRON, RETICCTPCT in the last 72 hours.   Physical Exam: Blood pressure (!) 101/51, pulse 73, temperature  98.3 F (36.8 C), temperature source Oral, resp. rate 17, height '5\' 8"'$  (1.727 m), weight 73.4 kg (161 lb 12.8 oz), SpO2 95 %.   Wt Readings from Last 1 Encounters:  10/29/17 73.4 kg (161 lb 12.8 oz)     General appearance: alert and cooperative Resp: clear to auscultation bilaterally Cardio: regular rate and rhythm GI: soft, non-tender; bowel sounds normal; no masses,  no organomegaly Pulses: 2+ and symmetric Neurologic: Grossly normal  TELEMETRY: Reviewed telemetry pt in nsr with lbbb  ASSESSMENT AND PLAN:  Principal Problem:   Chest pain Active Problems:   NSTEMI (non-ST elevated myocardial infarction) [facility]) plan: Patient admitted with chest pain typical of his arms and her.  He had a mild troponin elevation consistent with probable non-ST elevation myocardial infarction.  Currently he is pain-free.  Has been on aspirin, Plavix as an outpatient.  Is also been on carvedilol 3.125 mg twice daily.  He was admitted and placed on heparin.  Patient has had frequent restenosis episodes.  It is of note that the patient has had approximately a 2 g drop in hemoglobin since admission.  He had had some mild hemoptysis.  This only occurs with very vigorous coughing.  He denies any melena or  black stools.  Plan to proceed with left heart cath in the morning to evaluate coronary anatomy unless his hemoglobin continues to drop.  We agree with guaiac cards for his stool.  We will continue the pravastatin at 20 mg daily and lisinopril for afterload reduction.  Further recommendations based on his laboratories tomorrow and possible left heart cath.  Multiple myeloma-being followed by oncology.   HLD (hyperlipidemia)-continue with pravastatin   Diabetes ([place])    Teodoro Spray, MD, [facility] 10/30/2017 9:20 AM

## 2017-10-30 NOTE — Progress Notes (Signed)
[facility] NAME: Dwayne Terry    MR#:  [id number]  DATE OF BIRTH:  June 06, 1930  SUBJECTIVE:  CHIEF COMPLAINT:   Chief Complaint  Patient presents with  . Chest Pain   No chest pain, nausea or diaphoresis. On heparin drip. REVIEW OF SYSTEMS:  Review of Systems  Constitutional: Negative for chills, fever and malaise/fatigue.  HENT: Negative for sore throat.   Eyes: Negative for blurred vision and double vision.  Respiratory: Negative for cough, hemoptysis, shortness of breath, wheezing and stridor.   Cardiovascular: Negative for chest pain, palpitations, orthopnea and leg swelling.  Gastrointestinal: Negative for abdominal pain, blood in stool, diarrhea, melena, nausea and vomiting.  Genitourinary: Negative for dysuria, flank pain and hematuria.  Musculoskeletal: Negative for back pain and joint pain.  Neurological: Negative for dizziness, sensory change, focal weakness, seizures, loss of consciousness, weakness and headaches.  Endo/Heme/Allergies: Negative for polydipsia.  Psychiatric/Behavioral: Negative for depression. The patient is not nervous/anxious.     DRUG ALLERGIES:   Allergies  Allergen Reactions  . Antihistamines, Chlorpheniramine-Type Other (See Comments)    Reaction: unknown  . Nitroglycerin Other (See Comments)    Reaction: hypotension   VITALS:  Blood pressure (!) 101/51, pulse 73, temperature 98.3 F (36.8 C), temperature source Oral, resp. rate 17, height 5\' 8"  (1.727 m), weight 161 lb 12.8 oz (73.4 kg), SpO2 95 %. PHYSICAL EXAMINATION:  Physical Exam  Constitutional: He is oriented to person, place, and time and well-developed, well-nourished, and in no distress.  HENT:  Head: Normocephalic.  Mouth/Throat: Oropharynx is clear and moist.  Eyes: Conjunctivae and EOM are normal. Pupils are equal, round, and reactive to light. No scleral icterus.  Neck: Normal range of motion. Neck supple. No JVD  present. No tracheal deviation present.  Cardiovascular: Normal rate, regular rhythm and normal heart sounds. Exam reveals no gallop.  No murmur heard. Pulmonary/Chest: Effort normal and breath sounds normal. No respiratory distress. He has no wheezes. He has no rales.  Abdominal: Soft. Bowel sounds are normal. He exhibits no distension. There is no tenderness. There is no rebound.  Musculoskeletal: Normal range of motion. He exhibits no edema or tenderness.  Neurological: He is alert and oriented to person, place, and time. No cranial nerve deficit.  Skin: No rash noted. No erythema.  Psychiatric: Affect normal.   LABORATORY PANEL:  Male CBC Recent Labs  Lab 10/30/17 0527  WBC 8.6  HGB 9.8*  HCT 28.5*  PLT 94*   ------------------------------------------------------------------------------------------------------------------ Chemistries  Recent Labs  Lab 10/28/17 2223  10/30/17 0527  NA 135   < > 136  K 4.4   < > 3.7  CL 106   < > 105  CO2 18*   < > 24  GLUCOSE 265*   < > 98  BUN 27*   < > 25*  CREATININE 1.08   < > 0.78  CALCIUM 8.8*   < > 8.8*  AST 30  --   --   ALT 24  --   --   ALKPHOS 73  --   --   BILITOT 0.6  --   --    < > = values in this interval not displayed.   RADIOLOGY:  No results found. ASSESSMENT AND PLAN:   NSTEMI.  cardiac enzymes trending up, continue heparin drip.  Aspirin, Plavix and statin, consider cardiac cath tomorrow per Dr. Ubaldo Glassing.  Dehydration.  Hold Lasix, start NS iv, follow-up BMP.  Diabetes ([place]) -sliding scale insulin with corresponding glucose checks   HLD (hyperlipidemia) -home dose statin  Anemia. Unclear etiology. Hb down to 9.8. Possible due to heparin drip. Check FOTB and f/u Hb.  Discussed with Dr. Ubaldo Glassing. All the records are reviewed and case discussed with Care Management/Social Worker. Management plans discussed with the patient, his daughter and they are in agreement.  CODE STATUS: Full Code  TOTAL TIME TAKING  CARE OF THIS PATIENT:33 minutes.   More than 50% of the time was spent in counseling/coordination of care: YES  POSSIBLE D/C IN 2 DAYS, DEPENDING ON CLINICAL CONDITION.   Demetrios Loll M.D on 10/30/2017 at 1:48 PM  Between 7am to 6pm - Pager - [phone]  After 6pm go to [email or web address] - [facility]

## 2017-10-30 NOTE — Progress Notes (Signed)
Initial Nutrition Assessment  DOCUMENTATION CODES:   Not applicable  INTERVENTION:  Provide Glucerna Shake po BID, each supplement provides 220 kcal and 10 grams of protein.  Encouraged ongoing intake of well-balanced meals with adequate protein, fruits, and vegetables.  Patient is very familiar with heart-healthy diet from heart track and has successfully implemented components of diet to lose weight from 240 lbs to current body weight, which is a healthy body weight, over many years. No further education needs identified at this time.  RD unable to access [employer] at this time, but contacted diet office and asked them to place a note to send plastic utensils on tray for patient per his request.  NUTRITION DIAGNOSIS:   Increased nutrient needs related to catabolic illness, cancer and cancer related treatments(multiple myeloma on chemotherapy, hx prostate cancer) as evidenced by estimated needs.  GOAL:   Patient will meet greater than or equal to 90% of their needs  MONITOR:   PO intake, Supplement acceptance, Labs, Weight trends, I & O's  REASON FOR ASSESSMENT:   Malnutrition Screening Tool    ASSESSMENT:   83 year old male with PMHx of multiple myeloma, prostate cancer, CAD s/p CABG 12/13/1996, hx MI, frequent admissions for chest pain and NSTEMI diagnosis s/p multiple stents, HLD, DM type 2 who presented with chest pain admitted with NSTEMI.   -Per chart plan is for left heart cath on 12/3.  Met with patient at bedside. He reports he has been losing weight intentionally for the past few years because he has had 12-13 heart attacks and he wanted to eat better. He is a participant of heart track and has learned about heart-healthy diet. He includes fruits and vegetables at each meal and cut out processed and fried/fatty foods. He eats 3 meals per day and has a good protein source at each meal. He also drinks Glucerna BID (mid-morning and around 3pm). He is on chemotherapy for  his multiple myeloma at the [facility] hospital. He reports he has a good appetite as long as he eats with plastic utensils (silverware gives him a metallic taste at meals).   Patient reports UBW was 240 lbs many years ago before he starting losing weight intentionally. Looking back in chart could not find weight even above 185 lbs in the past 3 years, so likely was quite a while ago patient weighed 240 lbs.  Meal Completion: 50-100%  Medications reviewed and include: acyclovir, Novolog 0-9 units TID, Novolog 0-5 units QHS, heparin infusion.  Labs reviewed: CBG 106-139, BUN 25.  Patient does not meet criteria for malnutrition at this time.  NUTRITION - FOCUSED PHYSICAL EXAM:    Most Recent Value  Orbital Region  No depletion  Upper Arm Region  Mild depletion  Thoracic and Lumbar Region  No depletion  Buccal Region  No depletion  Temple Region  No depletion  Clavicle Bone Region  No depletion  Clavicle and Acromion Bone Region  No depletion  Scapular Bone Region  No depletion  Dorsal Hand  No depletion  Patellar Region  Mild depletion  Anterior Thigh Region  Mild depletion  Posterior Calf Region  Mild depletion  Edema (RD Assessment)  None  Hair  Reviewed  Eyes  Reviewed  Mouth  Reviewed  Skin  Reviewed [ecchymosis to right hand/arm]  Nails  Reviewed     Diet Order:  Diet heart healthy/carb modified Room service appropriate? Yes; Fluid consistency: Thin  EDUCATION NEEDS:   No education needs have been identified at this  time  Skin:  Skin Assessment: Reviewed RN Assessment(no issues)  Last BM:  10/29/2017  Height:   Ht Readings from Last 1 Encounters:  10/28/17 '5\' 8"'  (1.727 m)    Weight:   Wt Readings from Last 1 Encounters:  10/29/17 161 lb 12.8 oz (73.4 kg)    Ideal Body Weight:  70 kg  BMI:  Body mass index is 24.6 kg/m.  Estimated Nutritional Needs:   Kcal:  1820-2100 (MSJ x 1.3-1.5)  Protein:  90-110 grams (1.2-1.5 grams/kg)  Fluid:  1.8-2.2 L/day (25-30  mL/kg)  Willey Blade, MS, RD, LDN Office: [phone] Pager: [phone] After Hours/Weekend Pager: [phone]

## 2017-10-30 NOTE — Progress Notes (Signed)
Pt BP was at 101/57. Doctor Bridgett Larsson was notified and states to hold Carvedilol and Lisinopril. Will continue to monitor.

## 2017-10-30 NOTE — Progress Notes (Signed)
Glucometer is not sinking yet. Blood Sugar as per Kristine Garbe (tech) was at 139.

## 2017-10-30 NOTE — Progress Notes (Signed)
Pt is refusing bed alarm but was educated about safety.

## 2017-10-30 NOTE — Progress Notes (Signed)
Glucometer is not sinking yet Blood sugar as per Kristine Garbe [facility]) was at 137.

## 2017-10-30 NOTE — Progress Notes (Signed)
Pt has no complaints and was resting. Wife and children visited him today.

## 2017-10-31 ENCOUNTER — Encounter: Payer: Self-pay | Admitting: Cardiology

## 2017-10-31 ENCOUNTER — Encounter
Admission: EM | Disposition: A | Discharge status: Home / Self Care | Payer: Self-pay | Source: Home / Self Care | Attending: Internal Medicine

## 2017-10-31 HISTORY — PX: LEFT HEART CATH AND CORONARY ANGIOGRAPHY: CATH118249

## 2017-10-31 LAB — CBC
HCT: 27.8 % — ABNORMAL LOW (ref 40.0–52.0)
Hemoglobin: 9.7 g/dL — ABNORMAL LOW (ref 13.0–18.0)
MCH: 33.1 pg (ref 26.0–34.0)
MCHC: 35 g/dL (ref 32.0–36.0)
MCV: 94.5 fL (ref 80.0–100.0)
PLATELETS: 90 10*3/uL — AB (ref 150–440)
RBC: 2.94 MIL/uL — AB (ref 4.40–5.90)
RDW: 15.8 % — AB (ref 11.5–14.5)
WBC: 8.9 10*3/uL (ref 3.8–10.6)

## 2017-10-31 LAB — BASIC METABOLIC PANEL
Anion gap: 7 (ref 5–15)
BUN: 20 mg/dL (ref 6–20)
CALCIUM: 8.7 mg/dL — AB (ref 8.9–10.3)
CO2: 23 mmol/L (ref 22–32)
Chloride: 108 mmol/L (ref 101–111)
Creatinine, Ser: 0.84 mg/dL (ref 0.61–1.24)
GFR calc Af Amer: >60 mL/min (ref >60)
Glucose, Bld: 108 mg/dL — ABNORMAL HIGH (ref 65–99)
POTASSIUM: 3.5 mmol/L (ref 3.5–5.1)
SODIUM: 138 mmol/L (ref 135–145)

## 2017-10-31 LAB — HEPARIN LEVEL (UNFRACTIONATED): Heparin Unfractionated: 0.27 IU/mL — ABNORMAL LOW (ref 0.30–0.70)

## 2017-10-31 LAB — GLUCOSE, CAPILLARY
GLUCOSE-CAPILLARY: 105 mg/dL — AB (ref 65–99)
Glucose-Capillary: 166 mg/dL — ABNORMAL HIGH (ref 65–99)

## 2017-10-31 SURGERY — LEFT HEART CATH AND CORONARY ANGIOGRAPHY
Anesthesia: Moderate Sedation

## 2017-10-31 MED ORDER — FENTANYL CITRATE (PF) 100 MCG/2ML IJ SOLN
INTRAMUSCULAR | Status: AC
  Filled 2017-10-31: qty 2

## 2017-10-31 MED ORDER — SODIUM CHLORIDE 0.9% FLUSH: 3.0000 mL | INTRAVENOUS | Status: DC | PRN

## 2017-10-31 MED ORDER — HEPARIN BOLUS VIA INFUSION
1100.0000 [IU] | Freq: Once | INTRAVENOUS | Status: AC
  Administered 2017-10-31: 1100 [IU] via INTRAVENOUS
  Filled 2017-10-31: qty 1100

## 2017-10-31 MED ORDER — SODIUM CHLORIDE 0.9% FLUSH
3.0000 mL | Freq: Two times a day (BID) | INTRAVENOUS | Status: DC
  Administered 2017-10-31: 3 mL via INTRAVENOUS

## 2017-10-31 MED ORDER — SODIUM CHLORIDE 0.9 % WEIGHT BASED INFUSION
1.0000 mL/kg/h | INTRAVENOUS | Status: DC
  Administered 2017-10-31: 1 mL/kg/h via INTRAVENOUS

## 2017-10-31 MED ORDER — LIDOCAINE HCL (PF) 1 % IJ SOLN
INTRAMUSCULAR | Status: AC
  Filled 2017-10-31: qty 30

## 2017-10-31 MED ORDER — SODIUM CHLORIDE 0.9 % IV SOLN: 250.0000 mL | INTRAVENOUS | Status: DC | PRN

## 2017-10-31 MED ORDER — IOPAMIDOL (ISOVUE-300) INJECTION 61%
INTRAVENOUS | Status: DC | PRN
  Administered 2017-10-31: 120 mL via INTRA_ARTERIAL

## 2017-10-31 MED ORDER — SODIUM CHLORIDE 0.9 % WEIGHT BASED INFUSION: 1.0000 mL/kg/h | INTRAVENOUS | Status: DC

## 2017-10-31 MED ORDER — SODIUM CHLORIDE 0.9% FLUSH: 3.0000 mL | Freq: Two times a day (BID) | INTRAVENOUS | Status: DC

## 2017-10-31 MED ORDER — HEPARIN (PORCINE) IN NACL 2-0.9 UNIT/ML-% IJ SOLN
INTRAMUSCULAR | Status: AC
  Filled 2017-10-31: qty 500

## 2017-10-31 MED ORDER — FENTANYL CITRATE (PF) 100 MCG/2ML IJ SOLN
INTRAMUSCULAR | Status: DC | PRN
  Administered 2017-10-31: 25 ug via INTRAVENOUS

## 2017-10-31 MED ORDER — MIDAZOLAM HCL 2 MG/2ML IJ SOLN
INTRAMUSCULAR | Status: AC
  Filled 2017-10-31: qty 2

## 2017-10-31 MED ORDER — MIDAZOLAM HCL 2 MG/2ML IJ SOLN
INTRAMUSCULAR | Status: DC | PRN
  Administered 2017-10-31: 1 mg via INTRAVENOUS

## 2017-10-31 MED ORDER — ACETAMINOPHEN 325 MG PO TABS: 650.0000 mg | ORAL_TABLET | ORAL | Status: DC | PRN

## 2017-10-31 MED ORDER — ASPIRIN 81 MG PO CHEW
81.0000 mg | CHEWABLE_TABLET | Freq: Once | ORAL | Status: AC
  Administered 2017-10-31: 81 mg via ORAL

## 2017-10-31 MED ORDER — SODIUM CHLORIDE 0.9 % WEIGHT BASED INFUSION
3.0000 mL/kg/h | INTRAVENOUS | Status: DC
  Administered 2017-10-31: 3 mL/kg/h via INTRAVENOUS

## 2017-10-31 MED ORDER — ONDANSETRON HCL 4 MG/2ML IJ SOLN: 4.0000 mg | Freq: Four times a day (QID) | INTRAMUSCULAR | Status: DC | PRN

## 2017-10-31 MED ORDER — ASPIRIN 81 MG PO CHEW
81.0000 mg | CHEWABLE_TABLET | ORAL | Status: DC
  Filled 2017-10-31: qty 1

## 2017-10-31 SURGICAL SUPPLY — 13 items
CATH INFINITI 5 FR IM (CATHETERS) ×2 IMPLANT
CATH INFINITI 5FR ANG PIGTAIL (CATHETERS) ×2 IMPLANT
CATH INFINITI 5FR JL4 (CATHETERS) ×2 IMPLANT
CATH INFINITI JR4 5F (CATHETERS) ×2 IMPLANT
DEVICE CLOSURE MYNXGRIP 5F (Vascular Products) ×2 IMPLANT
KIT MANI 3VAL PERCEP (MISCELLANEOUS) ×3 IMPLANT
NDL PERC 18GX7CM (NEEDLE) IMPLANT
NEEDLE PERC 18GX7CM (NEEDLE) ×3 IMPLANT
PACK CARDIAC CATH (CUSTOM PROCEDURE TRAY) ×3 IMPLANT
SHEATH AVANTI 5FR X 11CM (SHEATH) ×2 IMPLANT
WIRE EMERALD 3MM-J .035X150CM (WIRE) ×2 IMPLANT
WIRE EMERALD 3MM-J .035X260CM (WIRE) ×2 IMPLANT
WIRE HITORQ VERSACORE ST 145CM (WIRE) ×2 IMPLANT

## 2017-10-31 NOTE — Progress Notes (Signed)
[place] [facility]  SUBJECTIVE: no chest pain   Vitals:   10/31/17 0930 10/31/17 0945 10/31/17 1000 10/31/17 1022  BP: (!) 128/57 (!) 129/58 114/60 (!) 133/50  Pulse: 63 63 (!) 56 (!) 58  Resp: 16 15 16 14   Temp:      TempSrc:      SpO2: 95% 94% 93% 98%  Weight:      Height:        Intake/Output Summary (Last 24 hours) at 10/31/2017 1203 Last data filed at 10/31/2017 1122 Gross per 24 hour  Intake 483.5 ml  Output 2000 ml  Net -1516.5 ml    LABS: Basic Metabolic Panel: Recent Labs    10/30/17 0527 10/31/17 0536  NA 136 138  K 3.7 3.5  CL 105 108  CO2 24 23  GLUCOSE 98 108*  BUN 25* 20  CREATININE 0.78 0.84  CALCIUM 8.8* 8.7*   Liver Function Tests: Recent Labs    10/28/17 2223  AST 30  ALT 24  ALKPHOS 73  BILITOT 0.6  PROT 6.9  ALBUMIN 3.6   No results for input(s): LIPASE, AMYLASE in the last 72 hours. CBC: Recent Labs    10/28/17 2223  10/30/17 0527 10/31/17 0536  WBC 11.3*   < > 8.6 8.9  NEUTROABS 10.0*  --   --   --   HGB 11.5*   < > 9.8* 9.7*  HCT 34.7*   < > 28.5* 27.8*  MCV 96.5   < > 94.0 94.5  PLT 132*   < > 94* 90*   < > = values in this interval not displayed.   Cardiac Enzymes: Recent Labs    10/29/17 0332 10/29/17 0815 10/29/17 1329  TROPONINI 0.23* 0.26* 0.28*   BNP: Invalid input(s): POCBNP D-Dimer: No results for input(s): DDIMER in the last 72 hours. Hemoglobin A1C: No results for input(s): HGBA1C in the last 72 hours. Fasting Lipid Panel: No results for input(s): CHOL, HDL, LDLCALC, TRIG, CHOLHDL, LDLDIRECT in the last 72 hours. Thyroid Function Tests: No results for input(s): TSH, T4TOTAL, T3FREE, THYROIDAB in the last 72 hours.  Invalid input(s): FREET3 Anemia Panel: No results for input(s): VITAMINB12, FOLATE, FERRITIN, TIBC, IRON, RETICCTPCT in the last 72 hours.   Physical Exam: Blood pressure (!) 133/50, pulse (!) 58, temperature 98.4 F (36.9 C), temperature  source Oral, resp. rate 14, height 5\' 8"  (1.727 m), weight 72.6 kg (160 lb), SpO2 98 %.   Wt Readings from Last 1 Encounters:  10/31/17 72.6 kg (160 lb)     General appearance: alert and cooperative Resp: clear to auscultation bilaterally Cardio: regular rate and rhythm GI: soft, non-tender; bowel sounds normal; no masses,  no organomegaly Pulses: 2+ and symmetric Neurologic: Grossly normal  TELEMETRY: Reviewed telemetry pt in nsr   ASSESSMENT AND PLAN:  Principal Problem:   Chest pain Active Problems:   NSTEMI (non-ST elevated myocardial infarction) (HCC)-small non-ST elevation myocardial infarction secondary to elevated serum troponin.  Cardiac catheterization revealed no high-grade disease in a full metal jacket in the RCA.  Distal PDA lesion was unchanged from previous procedure.  Left internal mammary to LAD was patent.  Saphenous vein graft to OM was patent.  There is not appear to be any significant progression of disease that would require stenting.  We will continue with current regimen ambulate and consider discharge later today.  Early follow-up with Dr. Saralyn Pilar   HLD (hyperlipidemia)   Diabetes ([place])  Teodoro Spray, MD, [facility] 10/31/2017 12:03 PM

## 2017-10-31 NOTE — Progress Notes (Signed)
To cath lab via bed.

## 2017-10-31 NOTE — Progress Notes (Signed)
ANTICOAGULATION CONSULT NOTE - Initial Consult  Pharmacy Consult for heparin drip Indication: chest pain/ACS  Allergies  Allergen Reactions  . Antihistamines, Chlorpheniramine-Type Other (See Comments)    Reaction: unknown  . Nitroglycerin Other (See Comments)    Reaction: hypotension    Patient Measurements: Height: 5\' 8"  (172.7 cm) Weight: 161 lb 12.8 oz (73.4 kg) IBW/kg (Calculated) : 68.4 Heparin Dosing Weight: 72 kg  Vital Signs: Temp: 97.8 F (36.6 C) (12/03 0558) Temp Source: Oral (12/03 0558) BP: 118/55 (12/03 0558) Pulse Rate: 74 (12/03 0558)  Labs: Recent Labs    10/29/17 0332 10/29/17 0418 10/29/17 0815  10/29/17 1329 10/29/17 2049 10/30/17 0527 10/31/17 0536  HGB 10.8*  --   --   --   --   --  9.8* 9.7*  HCT 31.1*  --   --   --   --   --  28.5* 27.8*  PLT 114*  --   --   --   --   --  94* 90*  APTT  --  26  --   --   --   --   --   --   LABPROT  --  14.4  --   --   --   --   --   --   INR  --  1.13  --   --   --   --   --   --   HEPARINUNFRC  --   --   --    < >  --  0.53 0.50 0.27*  CREATININE 1.03  --   --   --   --   --  0.78 0.84  TROPONINI 0.23*  --  0.26*  --  0.28*  --   --   --    < > = values in this interval not displayed.    Estimated Creatinine Clearance: 62.2 mL/min (by C-G formula based on SCr of 0.84 mg/dL).   Medical History: Past Medical History:  Diagnosis Date  . Cancer ([place])    bone marrow, prostate cancer  . Diabetes mellitus without complication ([place])   . Hyperlipidemia   . MI (mitral incompetence)   . MI (myocardial infarction) ([place])     Medications:  No anticoagulation in PTA meds  Assessment: 83 yo male here with chest pain, hx of CAD.  Goal of Therapy:  Heparin level 0.3-0.7 units/ml Monitor platelets by anticoagulation protocol: Yes   Plan:  4000 unit bolus and initial rate of 900 units/hr. First heparin level 8 hours after start of infusion.  12/1 1327 heparin level (drawn late) 0.63, therapeutic. RN  confirms heparin running at 9 ml/hr, no s/sx of bleeding noted. Continue at current rate. Will recheck heparin level in 8h to confirm. CBC in AM.   12/1 2114 HL therapeutic x 2. Continue current rate. Will continue to check daily HL/CBC.  Kenndra Morris S, Pharm.D., BCPS Clinical Pharmacist 10/31/2017,6:25 AM    12/2 heparin level 0.50. Continue current regimen. Recheck heparin level and CBC with tomorrow AM labs.  12/3 AM heparin level 0.27. 1100 unit bolus and increase rate to 1050 units/hr. Recheck in 8 hours.  Sim Boast, PharmD, BCPS  10/31/17 6:25 AM

## 2017-10-31 NOTE — Discharge Instructions (Signed)
Heart healthy and ADA diet. °

## 2017-10-31 NOTE — Plan of Care (Signed)
  Progressing Pain Managment: General experience of comfort will improve 10/31/2017 0854 - Progressing by Etheleen Nicks, RN Note PRN medications for pain Education: Understanding of cardiac disease, CV risk reduction, and recovery process will improve 10/31/2017 0854 - Progressing by Etheleen Nicks, RN Cardiac: Ability to achieve and maintain adequate cardiovascular perfusion will improve 10/31/2017 0854 - Progressing by Etheleen Nicks, RN Note PT for cardiac cath today   Not Applicable Clinical Measurements: Will remain free from infection 88/04/4846 2072 - Not Applicable by Etheleen Nicks, RN

## 2017-10-31 NOTE — Progress Notes (Signed)
Pt discharged to home via wc.  Instructions and given to pt.  Questions answered.  No distress.

## 2017-10-31 NOTE — Progress Notes (Signed)
Ambulated pt in hallway without any shortness of breath, dizziness or chest pain.

## 2017-10-31 NOTE — Discharge Summary (Signed)
[facility] [place] NAME: Dwayne Terry    MR#:  [id number]  DATE OF BIRTH:  07-Dec-1929  DATE OF ADMISSION:  10/28/2017   ADMITTING PHYSICIAN: Lance Coon, MD  DATE OF DISCHARGE: 10/31/2017  PRIMARY CARE PHYSICIAN: Kirk Ruths, MD   ADMISSION DIAGNOSIS:  Abnormal EKG [R94.31] Chest pain, unspecified type [R07.9] DISCHARGE DIAGNOSIS:  Principal Problem:   Chest pain Active Problems:   NSTEMI (non-ST elevated myocardial infarction) (HCC)   HLD (hyperlipidemia)   Diabetes ([place])  SECONDARY DIAGNOSIS:   Past Medical History:  Diagnosis Date  . Cancer ([place])    bone marrow, prostate cancer  . Diabetes mellitus without complication ([place])   . Hyperlipidemia   . MI (mitral incompetence)   . MI (myocardial infarction) [facility])    HOSPITAL COURSE:    NSTEMI.  cardiac enzymes trending up, continue heparin drip.  Aspirin, Plavix and statin. Per Dr. Ubaldo Glassing,  Cardiac catheterization revealed no high-grade disease in a full metal jacket in the RCA.  Distal PDA lesion was unchanged from previous procedure.  Left internal mammary to LAD was patent.  Saphenous vein graft to OM was patent.  There is not appear to be any significant progression of disease that would require stenting.  We will continue with current regimen ambulate and consider discharge later today.  Early follow-up with Dr. Saralyn Pilar  Dehydration.  Hold Lasix, improved with NS iv. Diabetes ([place]) -sliding scale insulin with corresponding glucose checks HLD (hyperlipidemia) -home dose statin  Anemia. Unclear etiology. Hb down to 9.8 but stable at 9.7. Possible due to heparin drip. Check FOTB is negative.  Discussed with Dr. Ubaldo Glassing. DISCHARGE CONDITIONS:  Stable, discharge to home today. CONSULTS OBTAINED:  Treatment Team:  Teodoro Spray, MD DRUG ALLERGIES:   Allergies  Allergen Reactions  . Antihistamines, Chlorpheniramine-Type Other (See Comments)   Reaction: unknown  . Nitroglycerin Other (See Comments)    Reaction: hypotension   DISCHARGE MEDICATIONS:   Allergies as of 10/31/2017      Reactions   Antihistamines, Chlorpheniramine-type Other (See Comments)   Reaction: unknown   Nitroglycerin Other (See Comments)   Reaction: hypotension      Medication List    TAKE these medications   acetaminophen 325 MG tablet Commonly known as:  TYLENOL Take 650 mg by mouth every 6 (six) hours as needed.   acyclovir 200 MG capsule Commonly known as:  ZOVIRAX Take 200 mg by mouth 2 (two) times daily.   aspirin EC 81 MG tablet Take 1 tablet (81 mg total) by mouth daily.   carvedilol 6.25 MG tablet Commonly known as:  COREG Take 1 tablet (6.25 mg total) by mouth 2 (two) times daily with a meal. What changed:  how much to take   clopidogrel 75 MG tablet Commonly known as:  PLAVIX Take 1 tablet (75 mg total) by mouth daily with breakfast.   dexamethasone 2 MG tablet Commonly known as:  DECADRON Take 8.5 mg by mouth See admin instructions. Take 8.5 mg on Thursday and Friday.   finasteride 5 MG tablet Commonly known as:  PROSCAR Take 2.5 mg by mouth daily.   furosemide 20 MG tablet Commonly known as:  LASIX Take 1 tablet (20 mg total) by mouth daily.   lisinopril 2.5 MG tablet Commonly known as:  ZESTRIL Take 1 tablet (2.5 mg total) by mouth daily.   lovastatin 20 MG tablet Commonly known as:  MEVACOR Take 20 mg by mouth daily at  6 PM.   metFORMIN 500 MG tablet Commonly known as:  GLUCOPHAGE Take 500 mg by mouth 2 (two) times daily with a meal.   MULTI-VITAMINS Tabs Take 1 tablet by mouth daily.   potassium chloride SA 20 MEQ tablet Commonly known as:  K-DUR,KLOR-CON Take 1 tablet (20 mEq total) by mouth daily.   silver sulfADIAZINE 1 % cream Commonly known as:  SILVADENE Apply to affected area daily        DISCHARGE INSTRUCTIONS:  See AVS,  If you experience worsening of your admission symptoms, develop  shortness of breath, life threatening emergency, suicidal or homicidal thoughts you must seek medical attention immediately by calling 911 or calling your MD immediately  if symptoms less severe.  You Must read complete instructions/literature along with all the possible adverse reactions/side effects for all the Medicines you take and that have been prescribed to you. Take any new Medicines after you have completely understood and accpet all the possible adverse reactions/side effects.   Please note  You were cared for by a hospitalist during your hospital stay. If you have any questions about your discharge medications or the care you received while you were in the hospital after you are discharged, you can call the unit and asked to speak with the hospitalist on call if the hospitalist that took care of you is not available. Once you are discharged, your primary care physician will handle any further medical issues. Please note that NO REFILLS for any discharge medications will be authorized once you are discharged, as it is imperative that you return to your primary care physician (or establish a relationship with a primary care physician if you do not have one) for your aftercare needs so that they can reassess your need for medications and monitor your lab values.    On the day of Discharge:  VITAL SIGNS:  Blood pressure (!) 133/50, pulse (!) 58, temperature 98.4 F (36.9 C), temperature source Oral, resp. rate 14, height 5\' 8"  (1.727 m), weight 160 lb (72.6 kg), SpO2 98 %. PHYSICAL EXAMINATION:  GENERAL:  83 y.o.-year-old patient lying in the bed with no acute distress.  EYES: Pupils equal, round, reactive to light and accommodation. No scleral icterus. Extraocular muscles intact.  HEENT: Head atraumatic, normocephalic. Oropharynx and nasopharynx clear.  NECK:  Supple, no jugular venous distention. No thyroid enlargement, no tenderness.  LUNGS: Normal breath sounds bilaterally, no wheezing,  rales,rhonchi or crepitation. No use of accessory muscles of respiration.  CARDIOVASCULAR: S1, S2 normal. No murmurs, rubs, or gallops.  ABDOMEN: Soft, non-tender, non-distended. Bowel sounds present. No organomegaly or mass.  EXTREMITIES: No pedal edema, cyanosis, or clubbing.  NEUROLOGIC: Cranial nerves II through XII are intact. Muscle strength 5/5 in all extremities. Sensation intact. Gait not checked.  PSYCHIATRIC: The patient is alert and oriented x 3.  SKIN: No obvious rash, lesion, or ulcer.  DATA REVIEW:   CBC Recent Labs  Lab 10/31/17 0536  WBC 8.9  HGB 9.7*  HCT 27.8*  PLT 90*    Chemistries  Recent Labs  Lab 10/28/17 2223  10/31/17 0536  NA 135   < > 138  K 4.4   < > 3.5  CL 106   < > 108  CO2 18*   < > 23  GLUCOSE 265*   < > 108*  BUN 27*   < > 20  CREATININE 1.08   < > 0.84  CALCIUM 8.8*   < > 8.7*  AST 30  --   --  ALT 24  --   --   ALKPHOS 73  --   --   BILITOT 0.6  --   --    < > = values in this interval not displayed.     Microbiology Results  No results found for this or any previous visit.  RADIOLOGY:  No results found.   Management plans discussed with the patient, family and they are in agreement.  CODE STATUS: Full Code   TOTAL TIME TAKING CARE OF THIS PATIENT: 33 minutes.    Demetrios Loll M.D on 10/31/2017 at 2:03 PM  Between 7am to 6pm - Pager - [phone]  After 6pm go to [email or web address] - [employer] [facility]  Office  [phone]  CC: Primary care physician; Kirk Ruths, MD   Note: This dictation was prepared with Dragon dictation along with smaller phrase technology. Any transcriptional errors that result from this process are unintentional.

## 2017-10-31 NOTE — Progress Notes (Signed)
Pt returned from cath lab.  IVF infusing well lt arm.  Rt groin dressing dry and intact.  Pulses equal bil.  Denies need, cb in reach, family at bedside

## 2017-12-05 ENCOUNTER — Encounter: Payer: Medicare HMO | Attending: Cardiovascular Disease | Admitting: *Deleted

## 2017-12-05 VITALS — Ht 67.2 in | Wt 163.6 lb

## 2017-12-05 DIAGNOSIS — Z79899 Other long term (current) drug therapy: Secondary | ICD-10-CM | POA: Diagnosis not present

## 2017-12-05 DIAGNOSIS — Z8546 Personal history of malignant neoplasm of prostate: Secondary | ICD-10-CM | POA: Insufficient documentation

## 2017-12-05 DIAGNOSIS — Z87891 Personal history of nicotine dependence: Secondary | ICD-10-CM | POA: Insufficient documentation

## 2017-12-05 DIAGNOSIS — E785 Hyperlipidemia, unspecified: Secondary | ICD-10-CM | POA: Diagnosis not present

## 2017-12-05 DIAGNOSIS — Z7984 Long term (current) use of oral hypoglycemic drugs: Secondary | ICD-10-CM | POA: Insufficient documentation

## 2017-12-05 DIAGNOSIS — Z955 Presence of coronary angioplasty implant and graft: Secondary | ICD-10-CM | POA: Diagnosis not present

## 2017-12-05 DIAGNOSIS — I214 Non-ST elevation (NSTEMI) myocardial infarction: Secondary | ICD-10-CM | POA: Diagnosis not present

## 2017-12-05 DIAGNOSIS — Z7982 Long term (current) use of aspirin: Secondary | ICD-10-CM | POA: Diagnosis not present

## 2017-12-05 DIAGNOSIS — E119 Type 2 diabetes mellitus without complications: Secondary | ICD-10-CM | POA: Diagnosis not present

## 2017-12-05 NOTE — Patient Instructions (Signed)
Patient Instructions  Patient Details  Name: Dwayne Terry MRN: [id number] Date of Birth: August 25, 1930 Referring Provider:  Wellington Hampshire, MD  Below are the personal goals you chose as well as exercise and nutrition goals. Our goal is to help you keep on track towards obtaining and maintaining your goals. We will be discussing your progress on these goals with you throughout the program.  Initial Exercise Prescription: Initial Exercise Prescription - 12/05/17 1400      Date of Initial Exercise RX and Referring Provider   Date  12/05/17    Referring Provider  Kathlyn Sacramento MD      NuStep   Level  1    SPM  80    Minutes  15    METs  1.7      Arm Ergometer   Level  1    Watts  21    RPM  25    Minutes  15    METs  1.7      Track   Laps  25    Minutes  15    METs  2.15      Prescription Details   Frequency (times per week)  3    Duration  Progress to 45 minutes of aerobic exercise without signs/symptoms of physical distress      Intensity   THRR 40-80% of Max Heartrate  97-122    Ratings of Perceived Exertion  11-13    Perceived Dyspnea  0-4      Progression   Progression  Continue to progress workloads to maintain intensity without signs/symptoms of physical distress.      Resistance Training   Training Prescription  Yes    Weight  3 lbs    Reps  10-15       Exercise Goals: Frequency: Be able to perform aerobic exercise three times per week working toward 3-5 days per week.  Intensity: Work with a perceived exertion of 11 (fairly light) - 15 (hard) as tolerated. Follow your new exercise prescription and watch for changes in prescription as you progress with the program. Changes will be reviewed with you when they are made.  Duration: You should be able to do 30 minutes of continuous aerobic exercise in addition to a 5 minute warm-up and a 5 minute cool-down routine.  Nutrition Goals: Your personal nutrition goals will be established when you do your  nutrition analysis with the dietician.  The following are nutrition guidelines to follow: Cholesterol < 200mg /day Sodium < 1500mg /day Fiber: Men over 50 yrs - 30 grams per day  Personal Goals: Personal Goals and Risk Factors at Admission - 12/05/17 1212      Core Components/Risk Factors/Patient Goals on Admission    Weight Management  Weight Maintenance;Yes    Intervention  Weight Management: Develop a combined nutrition and exercise program designed to reach desired caloric intake, while maintaining appropriate intake of nutrient and fiber, sodium and fats, and appropriate energy expenditure required for the weight goal.;Weight Management: Provide education and appropriate resources to help participant work on and attain dietary goals. Bill wishes to stay around 160 lb     Admit Weight  163 lb (73.9 kg)    Goal Weight: Short Term  160 lb (72.6 kg)    Goal Weight: Long Term  158 lb (71.7 kg)    Expected Outcomes  Short Term: Continue to assess and modify interventions until short term weight is achieved;Long Term: Adherence to nutrition and physical activity/exercise program aimed  toward attainment of established weight goal;Weight Maintenance: Understanding of the daily nutrition guidelines, which includes 25-35% calories from fat, 7% or less cal from saturated fats, less than 200mg  cholesterol, less than 1.5gm of sodium, & 5 or more servings of fruits and vegetables daily;Understanding recommendations for meals to include 15-35% energy as protein, 25-35% energy from fat, 35-60% energy from carbohydrates, less than 200mg  of dietary cholesterol, 20-35 gm of total fiber daily;Understanding of distribution of calorie intake throughout the day with the consumption of 4-5 meals/snacks    Diabetes  Yes    Intervention  Provide education about signs/symptoms and action to take for hypo/hyperglycemia.;Provide education about proper nutrition, including hydration, and aerobic/resistive exercise  prescription along with prescribed medications to achieve blood glucose in normal ranges: Fasting glucose 65-99 mg/dL    Expected Outcomes  Short Term: Participant verbalizes understanding of the signs/symptoms and immediate care of hyper/hypoglycemia, proper foot care and importance of medication, aerobic/resistive exercise and nutrition plan for blood glucose control.;Long Term: Attainment of HbA1C < 7%.    Heart Failure  Yes    Intervention  Provide a combined exercise and nutrition program that is supplemented with education, support and counseling about heart failure. Directed toward relieving symptoms such as shortness of breath, decreased exercise tolerance, and extremity edema.    Expected Outcomes  Improve functional capacity of life;Short term: Attendance in program 2-3 days a week with increased exercise capacity. Reported lower sodium intake. Reported increased fruit and vegetable intake. Reports medication compliance.;Short term: Daily weights obtained and reported for increase. Utilizing diuretic protocols set by physician.;Long term: Adoption of self-care skills and reduction of barriers for early signs and symptoms recognition and intervention leading to self-care maintenance.    Hypertension  Yes    Intervention  Provide education on lifestyle modifcations including regular physical activity/exercise, weight management, moderate sodium restriction and increased consumption of fresh fruit, vegetables, and low fat dairy, alcohol moderation, and smoking cessation.;Monitor prescription use compliance.    Expected Outcomes  Short Term: Continued assessment and intervention until BP is < 140/84mm HG in hypertensive participants. < 130/76mm HG in hypertensive participants with diabetes, heart failure or chronic kidney disease.;Long Term: Maintenance of blood pressure at goal levels.    Lipids  Yes    Intervention  Provide education and support for participant on nutrition & aerobic/resistive  exercise along with prescribed medications to achieve LDL 70mg , HDL >40mg .    Expected Outcomes  Short Term: Participant states understanding of desired cholesterol values and is compliant with medications prescribed. Participant is following exercise prescription and nutrition guidelines.;Long Term: Cholesterol controlled with medications as prescribed, with individualized exercise RX and with personalized nutrition plan. Value goals: LDL < 70mg , HDL > 40 mg.    Stress  Yes Wife of 38 years is starting to "lose memory" and he doesn't know why. He also has bone cancer which he receives chemo weekly for.     Intervention  Offer individual and/or small group education and counseling on adjustment to heart disease, stress management and health-related lifestyle change. Teach and support self-help strategies.;Refer participants experiencing significant psychosocial distress to appropriate mental health specialists for further evaluation and treatment. When possible, include family members and significant others in education/counseling sessions.    Expected Outcomes  Short Term: Participant demonstrates changes in health-related behavior, relaxation and other stress management skills, ability to obtain effective social support, and compliance with psychotropic medications if prescribed.;Long Term: Emotional wellbeing is indicated by absence of clinically significant psychosocial distress or social  isolation.       Tobacco Use Initial Evaluation: Social History   Tobacco Use  Smoking Status Former Smoker  . Last attempt to quit: 2015  . Years since quitting: 4.0  Smokeless Tobacco Former Systems developer    Exercise Goals and Review: Exercise Goals    Row Name 12/05/17 1425             Exercise Goals   Increase Physical Activity  Yes       Intervention  Provide advice, education, support and counseling about physical activity/exercise needs.;Develop an individualized exercise prescription for aerobic and  resistive training based on initial evaluation findings, risk stratification, comorbidities and participant's personal goals.       Expected Outcomes  Achievement of increased cardiorespiratory fitness and enhanced flexibility, muscular endurance and strength shown through measurements of functional capacity and personal statement of participant.       Increase Strength and Stamina  Yes       Intervention  Provide advice, education, support and counseling about physical activity/exercise needs.;Develop an individualized exercise prescription for aerobic and resistive training based on initial evaluation findings, risk stratification, comorbidities and participant's personal goals.       Expected Outcomes  Achievement of increased cardiorespiratory fitness and enhanced flexibility, muscular endurance and strength shown through measurements of functional capacity and personal statement of participant.       Able to understand and use rate of perceived exertion (RPE) scale  Yes       Intervention  Provide education and explanation on how to use RPE scale       Expected Outcomes  Short Term: Able to use RPE daily in rehab to express subjective intensity level;Long Term:  Able to use RPE to guide intensity level when exercising independently       Knowledge and understanding of Target Heart Rate Range (THRR)  Yes       Intervention  Provide education and explanation of THRR including how the numbers were predicted and where they are located for reference       Expected Outcomes  Short Term: Able to state/look up THRR;Long Term: Able to use THRR to govern intensity when exercising independently;Short Term: Able to use daily as guideline for intensity in rehab       Able to check pulse independently  Yes       Intervention  Provide education and demonstration on how to check pulse in carotid and radial arteries.;Review the importance of being able to check your own pulse for safety during independent exercise        Expected Outcomes  Long Term: Able to check pulse independently and accurately;Short Term: Able to explain why pulse checking is important during independent exercise       Understanding of Exercise Prescription  Yes       Intervention  Provide education, explanation, and written materials on patient's individual exercise prescription       Expected Outcomes  Short Term: Able to explain program exercise prescription;Long Term: Able to explain home exercise prescription to exercise independently          Copy of goals given to participant.

## 2017-12-05 NOTE — Progress Notes (Signed)
Cardiac Individual Treatment Plan  Patient Details  Name: Dwayne Terry MRN: [id number] Date of Birth: 1930/01/04 Referring Provider:     Cardiac Rehab from 12/05/2017 in [facility] Cardiac and Pulmonary Rehab  Referring Provider  Kathlyn Sacramento MD      Initial Encounter Date:    Cardiac Rehab from 12/05/2017 in [facility] Cardiac and Pulmonary Rehab  Date  12/05/17  Referring Provider  Kathlyn Sacramento MD      Visit Diagnosis: NSTEMI (non-ST elevated myocardial infarction) [facility])  Status post coronary artery stent placement  Patient's Home Medications on Admission:  Current Outpatient Medications:  .  acetaminophen (TYLENOL) 325 MG tablet, Take 650 mg by mouth every 6 (six) hours as needed., Disp: , Rfl:  .  acyclovir (ZOVIRAX) 200 MG capsule, Take 200 mg by mouth 2 (two) times daily., Disp: , Rfl:  .  aspirin EC 81 MG tablet, Take 1 tablet (81 mg total) by mouth daily., Disp: , Rfl:  .  carvedilol (COREG) 6.25 MG tablet, Take 1 tablet (6.25 mg total) by mouth 2 (two) times daily with a meal. (Patient taking differently: Take 3.25 mg by mouth 2 (two) times daily with a meal. ), Disp: 60 tablet, Rfl: 0 .  clopidogrel (PLAVIX) 75 MG tablet, Take 1 tablet (75 mg total) by mouth daily with breakfast., Disp: 30 tablet, Rfl: 1 .  dexamethasone (DECADRON) 2 MG tablet, Take 8.5 mg by mouth See admin instructions. Take 8.5 mg on Thursday and Friday., Disp: , Rfl:  .  finasteride (PROSCAR) 5 MG tablet, Take 2.5 mg by mouth daily. , Disp: , Rfl:  .  furosemide (LASIX) 20 MG tablet, Take 1 tablet (20 mg total) by mouth daily., Disp: 30 tablet, Rfl: 11 .  lansoprazole (PREVACID) 30 MG capsule, Take by mouth., Disp: , Rfl:  .  lisinopril (ZESTRIL) 2.5 MG tablet, Take 1 tablet (2.5 mg total) by mouth daily., Disp: 30 tablet, Rfl: 11 .  lovastatin (MEVACOR) 20 MG tablet, Take 20 mg by mouth daily at 6 PM., Disp: , Rfl:  .  metFORMIN (GLUCOPHAGE) 500 MG tablet, Take 500 mg by mouth 2 (two) times daily with a  meal., Disp: , Rfl:  .  Multiple Vitamin (MULTI-VITAMINS) TABS, Take 1 tablet by mouth daily., Disp: , Rfl:  .  potassium chloride SA (K-DUR,KLOR-CON) 20 MEQ tablet, Take 1 tablet (20 mEq total) by mouth daily., Disp: 30 tablet, Rfl: 0 .  silver sulfADIAZINE (SILVADENE) 1 % cream, Apply to affected area daily, Disp: 50 g, Rfl: 1  Past Medical History: Past Medical History:  Diagnosis Date  . Cancer ([place])    bone marrow, prostate cancer  . Diabetes mellitus without complication ([place])   . Hyperlipidemia   . MI (mitral incompetence)   . MI (myocardial infarction) ([place])     Tobacco Use: Social History   Tobacco Use  Smoking Status Former Smoker  . Last attempt to quit: 2015  . 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 Participant demonstrates ability to understand and report RPE using BORG scale, to self-measure pulse accurately, and to acknowledge the importance of the exercise prescription.  Exercise Prescription Goal: Starting with aerobic activity 30 plus minutes a day, 3 days per week for initial exercise prescription. Provide home exercise prescription and guidelines that participant acknowledges understanding prior to discharge.  Activity Barriers & Risk Stratification: Activity Barriers & Cardiac Risk Stratification - 12/05/17 1255      Activity Barriers & Cardiac Risk Stratification   Activity Barriers  Back Problems;Balance Concerns;Assistive Device;Muscular Weakness;Deconditioning    Cardiac Risk Stratification  High        6 Minute Walk: 6 Minute Walk    Row Name 12/05/17 1418         6 Minute Walk   Phase  Initial     Distance  1068 feet     Walk Time  6 minutes     # of Rest Breaks  0     MPH  2.02     METS  1.79     RPE  13     VO2 Peak  6.27     Symptoms  No     Resting HR  71 bpm     Resting BP  132/74     Resting Oxygen Saturation   99 %     Exercise Oxygen Saturation  during 6 min walk  100 %     Max Ex. HR  96 bpm     Max Ex. BP  136/66     2 Minute Post BP  112/66        Oxygen Initial Assessment:   Oxygen Re-Evaluation:   Oxygen Discharge (Final Oxygen Re-Evaluation):   Initial Exercise Prescription: Initial Exercise Prescription - 12/05/17 1400      Date of Initial Exercise RX and Referring Provider   Date  12/05/17    Referring Provider  Kathlyn Sacramento MD      NuStep   Level  1    SPM  80    Minutes  15    METs  1.7      Arm Ergometer   Level  1    Watts  21    RPM  25    Minutes  15    METs  1.7      Track   Laps  25    Minutes  15    METs  2.15      Prescription Details   Frequency (times per week)  3    Duration  Progress to 45 minutes of aerobic exercise without signs/symptoms of physical distress      Intensity   THRR 40-80% of Max Heartrate  97-122    Ratings of Perceived Exertion  11-13    Perceived Dyspnea  0-4      Progression   Progression  Continue to progress workloads to maintain intensity without signs/symptoms of physical distress.      Resistance Training   Training Prescription  Yes    Weight  3 lbs    Reps  10-15       Perform Capillary Blood Glucose checks as needed.  Exercise Prescription Changes: Exercise Prescription Changes    Row Name 12/05/17 1200             Response to Exercise   Blood Pressure (Admit)  132/74       Blood Pressure (Exercise)  136/66  Blood Pressure (Exit)  112/66       Heart Rate (Admit)  71 bpm       Heart Rate (Exercise)  96 bpm       Heart Rate (Exit)  65 bpm        Oxygen Saturation (Admit)  99 %       Oxygen Saturation (Exit)  100 %       Rating of Perceived Exertion (Exercise)  13       Symptoms  none       Comments  walk test results          Exercise Comments:   Exercise Goals and Review: Exercise Goals    Row Name 12/05/17 1425             Exercise Goals   Increase Physical Activity  Yes       Intervention  Provide advice, education, support and counseling about physical activity/exercise needs.;Develop an individualized exercise prescription for aerobic and resistive training based on initial evaluation findings, risk stratification, comorbidities and participant's personal goals.       Expected Outcomes  Achievement of increased cardiorespiratory fitness and enhanced flexibility, muscular endurance and strength shown through measurements of functional capacity and personal statement of participant.       Increase Strength and Stamina  Yes       Intervention  Provide advice, education, support and counseling about physical activity/exercise needs.;Develop an individualized exercise prescription for aerobic and resistive training based on initial evaluation findings, risk stratification, comorbidities and participant's personal goals.       Expected Outcomes  Achievement of increased cardiorespiratory fitness and enhanced flexibility, muscular endurance and strength shown through measurements of functional capacity and personal statement of participant.       Able to understand and use rate of perceived exertion (RPE) scale  Yes       Intervention  Provide education and explanation on how to use RPE scale       Expected Outcomes  Short Term: Able to use RPE daily in rehab to express subjective intensity level;Long Term:  Able to use RPE to guide intensity level when exercising independently       Knowledge and understanding of Target Heart Rate Range (THRR)  Yes       Intervention  Provide education and explanation of THRR including how the  numbers were predicted and where they are located for reference       Expected Outcomes  Short Term: Able to state/look up THRR;Long Term: Able to use THRR to govern intensity when exercising independently;Short Term: Able to use daily as guideline for intensity in rehab       Able to check pulse independently  Yes       Intervention  Provide education and demonstration on how to check pulse in carotid and radial arteries.;Review the importance of being able to check your own pulse for safety during independent exercise       Expected Outcomes  Long Term: Able to check pulse independently and accurately;Short Term: Able to explain why pulse checking is important during independent exercise       Understanding of Exercise Prescription  Yes       Intervention  Provide education, explanation, and written materials on patient's individual exercise prescription       Expected Outcomes  Short Term: Able to explain program exercise prescription;Long Term: Able to explain home exercise prescription to exercise independently  Exercise Goals Re-Evaluation :   Discharge Exercise Prescription (Final Exercise Prescription Changes): Exercise Prescription Changes - 12/05/17 1200      Response to Exercise   Blood Pressure (Admit)  132/74    Blood Pressure (Exercise)  136/66    Blood Pressure (Exit)  112/66    Heart Rate (Admit)  71 bpm    Heart Rate (Exercise)  96 bpm    Heart Rate (Exit)  65 bpm    Oxygen Saturation (Admit)  99 %    Oxygen Saturation (Exit)  100 %    Rating of Perceived Exertion (Exercise)  13    Symptoms  none    Comments  walk test results       Nutrition:  Target Goals: Understanding of nutrition guidelines, daily intake of sodium 1500mg , cholesterol 200mg , calories 30% from fat and 7% or less from saturated fats, daily to have 5 or more servings of fruits and vegetables.  Biometrics: Pre Biometrics - 12/05/17 1426      Pre Biometrics   Height  5' 7.2" (1.707 m)     Weight  163 lb 9.6 oz (74.2 kg)    Waist Circumference  35 inches    Hip Circumference  40 inches    Waist to Hip Ratio  0.88 %    BMI (Calculated)  25.47    Single Leg Stand  3.14 seconds        Nutrition Therapy Plan and Nutrition Goals: Nutrition Therapy & Goals - 12/05/17 1252      Intervention Plan   Intervention  Prescribe, educate and counsel regarding individualized specific dietary modifications aiming towards targeted core components such as weight, hypertension, lipid management, diabetes, heart failure and other comorbidities.;Nutrition handout(s) given to patient.    Expected Outcomes  Short Term Goal: Understand basic principles of dietary content, such as calories, fat, sodium, cholesterol and nutrients.;Short Term Goal: A plan has been developed with personal nutrition goals set during dietitian appointment.;Long Term Goal: Adherence to prescribed nutrition plan.       Nutrition Discharge: Rate Your Plate Scores: Nutrition Assessments - 12/05/17 1219      MEDFICTS Scores   Pre Score  -- will bring paperwork to first day of class       Nutrition Goals Re-Evaluation:   Nutrition Goals Discharge (Final Nutrition Goals Re-Evaluation):   Psychosocial: Target Goals: Acknowledge presence or absence of significant depression and/or stress, maximize coping skills, provide positive support system. Participant is able to verbalize types and ability to use techniques and skills needed for reducing stress and depression.   Initial Review & Psychosocial Screening: Initial Psych Review & Screening - 12/05/17 1249      Initial Review   Current issues with  Current Sleep Concerns;Current Stress Concerns    Source of Stress Concerns  Chronic Illness;Family    Comments  Bill's wife is having memory issues and is causing stress at home. He also has chemo every Thursday for bone cancer, he states that his doctor told him cancer won't kill him but his heart will give out first.  He says he is content with that, just trying to live his best life.       Family Dynamics   Good Support System?  Yes wife, daughter      Barriers   Psychosocial barriers to participate in program  There are no identifiable barriers or psychosocial needs.      Screening Interventions   Interventions  Yes;Encouraged to exercise;Program counselor consult  Expected Outcomes  Short Term goal: Utilizing psychosocial counselor, staff and physician to assist with identification of specific Stressors or current issues interfering with healing process. Setting desired goal for each stressor or current issue identified.;Long Term Goal: Stressors or current issues are controlled or eliminated.;Short Term goal: Identification and review with participant of any Quality of Life or Depression concerns found by scoring the questionnaire.;Long Term goal: The participant improves quality of Life and PHQ9 Scores as seen by post scores and/or verbalization of changes       Quality of Life Scores:  Quality of Life - 12/05/17 1252      Quality of Life Scores   Health/Function Pre  -- will bring paperwork to first day of class       PHQ-9: Recent Review Flowsheet Data    Depression screen [facility] 2/9 12/05/2017   Decreased Interest 0   Down, Depressed, Hopeless 0   PHQ - 2 Score 0   Altered sleeping 1   Tired, decreased energy 1   Change in appetite 0   Feeling bad or failure about yourself  0   Trouble concentrating 0   Moving slowly or fidgety/restless 0   Suicidal thoughts 0   PHQ-9 Score 2   Difficult doing work/chores Not difficult at all     Interpretation of Total Score  Total Score Depression Severity:  1-4 = Minimal depression, 5-9 = Mild depression, 10-14 = Moderate depression, 15-19 = Moderately severe depression, 20-27 = Severe depression   Psychosocial Evaluation and Intervention:   Psychosocial Re-Evaluation:   Psychosocial Discharge (Final Psychosocial  Re-Evaluation):   Vocational Rehabilitation: Provide vocational rehab assistance to qualifying candidates.   Vocational Rehab Evaluation & Intervention: Vocational Rehab - 12/05/17 1254      Initial Vocational Rehab Evaluation & Intervention   Assessment shows need for Vocational Rehabilitation  No       Education: Education Goals: Education classes will be provided on a variety of topics geared toward better understanding of heart health and risk factor modification. Participant will state understanding/return demonstration of topics presented as noted by education test scores.  Learning Barriers/Preferences: Learning Barriers/Preferences - 12/05/17 1253      Learning Barriers/Preferences   Learning Barriers  Hearing    Learning Preferences  Individual Instruction       Education Topics: General Nutrition Guidelines/Fats and Fiber: -Group instruction provided by verbal, written material, models and posters to present the general guidelines for heart healthy nutrition. Gives an explanation and review of dietary fats and fiber.   Cardiac Rehab from 05/26/2015 in [facility] Cardiac and Pulmonary Rehab  Date  05/19/15  Educator  CR  Instruction Review Code (retired)  2- meets goals/outcomes      Controlling Sodium/Reading Food Labels: -Group verbal and written material supporting the discussion of sodium use in heart healthy nutrition. Review and explanation with models, verbal and written materials for utilization of the food label.   Cardiac Rehab from 05/26/2015 in [facility] Cardiac and Pulmonary Rehab  Date  05/26/15  Educator  c. Joneen Caraway  Instruction Review Code (retired)  2- meets goals/outcomes      Exercise Physiology & Risk Factors: - Group verbal and written instruction with models to review the exercise physiology of the cardiovascular system and associated critical values. Details cardiovascular disease risk factors and the goals associated with each risk  factor.   Aerobic Exercise & Resistance Training: - Gives group verbal and written discussion on the health impact of inactivity. On the components of  aerobic and resistive training programs and the benefits of this training and how to safely progress through these programs.   Cardiac Rehab from 05/26/2015 in [facility] Cardiac and Pulmonary Rehab  Date  04/21/15  Educator  Dorisann Frames  Instruction Review Code (retired)  2- Statistician, Balance, General Exercise Guidelines: - Provides group verbal and written instruction on the benefits of flexibility and balance training programs. Provides general exercise guidelines with specific guidelines to those with heart or lung disease. Demonstration and skill practice provided.   Stress Management: - Provides group verbal and written instruction about the health risks of elevated stress, cause of high stress, and healthy ways to reduce stress.   Cardiac Rehab from 05/26/2015 in [facility] Cardiac and Pulmonary Rehab  Date  04/09/15  Educator  Lucianne Lei, LCSW  Instruction Review Code (retired)  2- meets goals/outcomes      Depression: - Provides group verbal and written instruction on the correlation between heart/lung disease and depressed mood, treatment options, and the stigmas associated with seeking treatment.   Anatomy & Physiology of the Heart: - Group verbal and written instruction and models provide basic cardiac anatomy and physiology, with the coronary electrical and arterial systems. Review of: AMI, Angina, Valve disease, Heart Failure, Cardiac Arrhythmia, Pacemakers, and the ICD.   Cardiac Rehab from 05/26/2015 in [facility] Cardiac and Pulmonary Rehab  Date  05/05/15  Educator  SB  Instruction Review Code (retired)  2- meets goals/outcomes      Cardiac Procedures: - Group verbal and written instruction to review commonly prescribed medications for heart disease. Reviews the medication, class of the drug, and  side effects. Includes the steps to properly store meds and maintain the prescription regimen. (beta blockers and nitrates)   Cardiac Medications I: - Group verbal and written instruction to review commonly prescribed medications for heart disease. Reviews the medication, class of the drug, and side effects. Includes the steps to properly store meds and maintain the prescription regimen.   Cardiac Rehab from 05/26/2015 in [facility] Cardiac and Pulmonary Rehab  Date  04/14/15  Educator  SB  Instruction Review Code (retired)  2- meets goals/outcomes      Cardiac Medications II: -Group verbal and written instruction to review commonly prescribed medications for heart disease. Reviews the medication, class of the drug, and side effects. (all other drug classes)    Go Sex-Intimacy & Heart Disease, Get SMART - Goal Setting: - Group verbal and written instruction through game format to discuss heart disease and the return to sexual intimacy. Provides group verbal and written material to discuss and apply goal setting through the application of the S.M.A.R.T. Method.   Other Matters of the Heart: - Provides group verbal, written materials and models to describe Heart Failure, Angina, Valve Disease, Peripheral Artery Disease, and Diabetes in the realm of heart disease. Includes description of the disease process and treatment options available to the cardiac patient.   Exercise & Equipment Safety: - Individual verbal instruction and demonstration of equipment use and safety with use of the equipment.   Cardiac Rehab from 12/05/2017 in [facility] Cardiac and Pulmonary Rehab  Date  12/05/17  Educator  [facility]  Instruction Review Code  1- Verbalizes Understanding      Infection Prevention: - Provides verbal and written material to individual with discussion of infection control including proper hand washing and proper equipment cleaning during exercise session.   Cardiac Rehab from 12/05/2017 in [facility] Cardiac and  Pulmonary  Rehab  Date  12/05/17  Educator  [place]  Instruction Review Code  1- Verbalizes Understanding      Falls Prevention: - Provides verbal and written material to individual with discussion of falls prevention and safety.   Cardiac Rehab from 12/05/2017 in [facility] Cardiac and Pulmonary Rehab  Date  12/05/17  Educator  [facility]  Instruction Review Code  1- Verbalizes Understanding      Diabetes: - Individual verbal and written instruction to review signs/symptoms of diabetes, desired ranges of glucose level fasting, after meals and with exercise. Acknowledge that pre and post exercise glucose checks will be done for 3 sessions at entry of program.   Cardiac Rehab from 12/05/2017 in [facility] Cardiac and Pulmonary Rehab  Date  12/05/17  Educator  [facility]  Instruction Review Code  1- Verbalizes Understanding      Other: -Provides group and verbal instruction on various topics (see comments)    Knowledge Questionnaire Score: Knowledge Questionnaire Score - 12/05/17 1254      Knowledge Questionnaire Score   Pre Score  -- will bring questionnaire first day of class       Core Components/Risk Factors/Patient Goals at Admission: Personal Goals and Risk Factors at Admission - 12/05/17 1212      Core Components/Risk Factors/Patient Goals on Admission    Weight Management  Weight Maintenance;Yes    Intervention  Weight Management: Develop a combined nutrition and exercise program designed to reach desired caloric intake, while maintaining appropriate intake of nutrient and fiber, sodium and fats, and appropriate energy expenditure required for the weight goal.;Weight Management: Provide education and appropriate resources to help participant work on and attain dietary goals. Bill wishes to stay around 160 lb     Admit Weight  163 lb (73.9 kg)    Goal Weight: Short Term  160 lb (72.6 kg)    Goal Weight: Long Term  158 lb (71.7 kg)    Expected Outcomes  Short Term: Continue to assess and modify  interventions until short term weight is achieved;Long Term: Adherence to nutrition and physical activity/exercise program aimed toward attainment of established weight goal;Weight Maintenance: Understanding of the daily nutrition guidelines, which includes 25-35% calories from fat, 7% or less cal from saturated fats, less than 200mg  cholesterol, less than 1.5gm of sodium, & 5 or more servings of fruits and vegetables daily;Understanding recommendations for meals to include 15-35% energy as protein, 25-35% energy from fat, 35-60% energy from carbohydrates, less than 200mg  of dietary cholesterol, 20-35 gm of total fiber daily;Understanding of distribution of calorie intake throughout the day with the consumption of 4-5 meals/snacks    Diabetes  Yes    Intervention  Provide education about signs/symptoms and action to take for hypo/hyperglycemia.;Provide education about proper nutrition, including hydration, and aerobic/resistive exercise prescription along with prescribed medications to achieve blood glucose in normal ranges: Fasting glucose 65-99 mg/dL    Expected Outcomes  Short Term: Participant verbalizes understanding of the signs/symptoms and immediate care of hyper/hypoglycemia, proper foot care and importance of medication, aerobic/resistive exercise and nutrition plan for blood glucose control.;Long Term: Attainment of HbA1C < 7%.    Heart Failure  Yes    Intervention  Provide a combined exercise and nutrition program that is supplemented with education, support and counseling about heart failure. Directed toward relieving symptoms such as shortness of breath, decreased exercise tolerance, and extremity edema.    Expected Outcomes  Improve functional capacity of life;Short term: Attendance in program 2-3 days a week with increased exercise  capacity. Reported lower sodium intake. Reported increased fruit and vegetable intake. Reports medication compliance.;Short term: Daily weights obtained and reported  for increase. Utilizing diuretic protocols set by physician.;Long term: Adoption of self-care skills and reduction of barriers for early signs and symptoms recognition and intervention leading to self-care maintenance.    Hypertension  Yes    Intervention  Provide education on lifestyle modifcations including regular physical activity/exercise, weight management, moderate sodium restriction and increased consumption of fresh fruit, vegetables, and low fat dairy, alcohol moderation, and smoking cessation.;Monitor prescription use compliance.    Expected Outcomes  Short Term: Continued assessment and intervention until BP is < 140/11mm HG in hypertensive participants. < 130/55mm HG in hypertensive participants with diabetes, heart failure or chronic kidney disease.;Long Term: Maintenance of blood pressure at goal levels.    Lipids  Yes    Intervention  Provide education and support for participant on nutrition & aerobic/resistive exercise along with prescribed medications to achieve LDL 70mg , HDL >40mg .    Expected Outcomes  Short Term: Participant states understanding of desired cholesterol values and is compliant with medications prescribed. Participant is following exercise prescription and nutrition guidelines.;Long Term: Cholesterol controlled with medications as prescribed, with individualized exercise RX and with personalized nutrition plan. Value goals: LDL < 70mg , HDL > 40 mg.    Stress  Yes Wife of 57 years is starting to "lose memory" and he doesn't know why. He also has bone cancer which he receives chemo weekly for.     Intervention  Offer individual and/or small group education and counseling on adjustment to heart disease, stress management and health-related lifestyle change. Teach and support self-help strategies.;Refer participants experiencing significant psychosocial distress to appropriate mental health specialists for further evaluation and treatment. When possible, include family  members and significant others in education/counseling sessions.    Expected Outcomes  Short Term: Participant demonstrates changes in health-related behavior, relaxation and other stress management skills, ability to obtain effective social support, and compliance with psychotropic medications if prescribed.;Long Term: Emotional wellbeing is indicated by absence of clinically significant psychosocial distress or social isolation.       Core Components/Risk Factors/Patient Goals Review:    Core Components/Risk Factors/Patient Goals at Discharge (Final Review):    ITP Comments: ITP Comments    Row Name 12/05/17 1202           ITP Comments  Med review completed. Initial ITP created. Diagnosis can be found in [facility] encounter 09/16/17          Comments: Initial ITP

## 2017-12-12 DIAGNOSIS — Z955 Presence of coronary angioplasty implant and graft: Secondary | ICD-10-CM

## 2017-12-12 DIAGNOSIS — I214 Non-ST elevation (NSTEMI) myocardial infarction: Secondary | ICD-10-CM | POA: Diagnosis not present

## 2017-12-12 LAB — GLUCOSE, CAPILLARY
GLUCOSE-CAPILLARY: 138 mg/dL — AB (ref 65–99)
GLUCOSE-CAPILLARY: 94 mg/dL (ref 65–99)

## 2017-12-12 NOTE — Progress Notes (Signed)
Daily Session Note  Patient Details  Name: Dwayne Terry MRN: [id number] Date of Birth: 1930/07/30 Referring Provider:     Cardiac Rehab from 12/05/2017 in [facility]  Referring Provider  Kathlyn Sacramento MD      Encounter Date: 12/12/2017  Check In: Session Check In - 12/12/17 1729      Check-In   Location  [facility]-Cardiac & Pul[facility]    Staff Present  Earlean Shawl, BS, ACSM CEP, Exercise Physiologist;Amanda Oletta Darter, BA, ACSM CEP, Exercise Physiologist;Carroll Enterkin, RN, BSN    Supervising physician immediately available to respond to emergencies  See telemetry face sheet for immediately available ER MD    Medication changes reported      No    Fall or balance concerns reported     No    Warm-up and Cool-down  Performed on first and last piece of equipment    Resistance Training Performed  Yes    VAD Patient?  No      Pain Assessment   Currently in Pain?  No/denies    Multiple Pain Sites  No          Social History   Tobacco Use  Smoking Status Former Smoker  . Last attempt to quit: 2015  . Years since quitting: 4.0  Smokeless Tobacco Former Environmental health practitioner Met:  Independence with exercise equipment Exercise tolerated well No report of cardiac concerns or symptoms Strength training completed today  Goals Unmet:  Not Applicable  Comments: First full day of exercise!  Patient was oriented to gym and equipment including functions, settings, policies, and procedures.  Patient's individual exercise prescription and treatment plan were reviewed.  All starting workloads were established based on the results of the 6 minute walk test done at initial orientation visit.  The plan for exercise progression was also introduced and progression will be customized based on patient's performance and goals.    Dr. Emily Filbert is Medical Director for [facility].

## 2017-12-14 ENCOUNTER — Telehealth: Payer: Self-pay | Admitting: *Deleted

## 2017-12-14 ENCOUNTER — Encounter: Payer: Self-pay | Admitting: *Deleted

## 2017-12-14 DIAGNOSIS — Z955 Presence of coronary angioplasty implant and graft: Secondary | ICD-10-CM

## 2017-12-14 DIAGNOSIS — I214 Non-ST elevation (NSTEMI) myocardial infarction: Secondary | ICD-10-CM

## 2017-12-14 NOTE — Telephone Encounter (Signed)
Dwayne Terry called to let us know that he has not been feeling since Monday. He does not think he over did it, but wants to wait to return on Monday.

## 2017-12-21 ENCOUNTER — Telehealth: Payer: Self-pay | Admitting: *Deleted

## 2017-12-21 ENCOUNTER — Encounter: Payer: Self-pay | Admitting: *Deleted

## 2017-12-21 DIAGNOSIS — Z955 Presence of coronary angioplasty implant and graft: Secondary | ICD-10-CM

## 2017-12-21 DIAGNOSIS — I214 Non-ST elevation (NSTEMI) myocardial infarction: Secondary | ICD-10-CM

## 2017-12-21 NOTE — Telephone Encounter (Signed)
Dwayne Terry has been out since last Monday.  He doesn't think that it was his exercise.  He is also getting chemo treatments on Thursdays.  He is having a hard time getting around due to back pain and would like to rest for a few weeks while he thinks more about the program.  We will follow up with him again on the week of Feb 4th.

## 2017-12-28 ENCOUNTER — Encounter: Payer: Self-pay | Admitting: *Deleted

## 2017-12-28 DIAGNOSIS — I214 Non-ST elevation (NSTEMI) myocardial infarction: Secondary | ICD-10-CM

## 2017-12-28 DIAGNOSIS — Z955 Presence of coronary angioplasty implant and graft: Secondary | ICD-10-CM

## 2017-12-28 NOTE — Progress Notes (Signed)
Cardiac Individual Treatment Plan  Patient Details  Name: Dwayne Terry MRN: [id number] Date of Birth: [birth year] Referring Provider:     Cardiac Rehab from 12/05/2017 in [facility] Cardiac and Pulmonary Rehab  Referring Provider  Kathlyn Sacramento MD      Initial Encounter Date:    Cardiac Rehab from 12/05/2017 in [facility] Cardiac and Pulmonary Rehab  Date  12/05/17  Referring Provider  Kathlyn Sacramento MD      Visit Diagnosis: NSTEMI (non-ST elevated myocardial infarction) [facility])  Status post coronary artery stent placement  Patient's Home Medications on Admission:  Current Outpatient Medications:  .  acetaminophen (TYLENOL) 325 MG tablet, Take 650 mg by mouth every 6 (six) hours as needed., Disp: , Rfl:  .  acyclovir (ZOVIRAX) 200 MG capsule, Take 200 mg by mouth 2 (two) times daily., Disp: , Rfl:  .  aspirin EC 81 MG tablet, Take 1 tablet (81 mg total) by mouth daily., Disp: , Rfl:  .  carvedilol (COREG) 6.25 MG tablet, Take 1 tablet (6.25 mg total) by mouth 2 (two) times daily with a meal. (Patient taking differently: Take 3.25 mg by mouth 2 (two) times daily with a meal. ), Disp: 60 tablet, Rfl: 0 .  clopidogrel (PLAVIX) 75 MG tablet, Take 1 tablet (75 mg total) by mouth daily with breakfast., Disp: 30 tablet, Rfl: 1 .  dexamethasone (DECADRON) 2 MG tablet, Take 8.5 mg by mouth See admin instructions. Take 8.5 mg on Thursday and Friday., Disp: , Rfl:  .  finasteride (PROSCAR) 5 MG tablet, Take 2.5 mg by mouth daily. , Disp: , Rfl:  .  furosemide (LASIX) 20 MG tablet, Take 1 tablet (20 mg total) by mouth daily., Disp: 30 tablet, Rfl: 11 .  lansoprazole (PREVACID) 30 MG capsule, Take by mouth., Disp: , Rfl:  .  lisinopril (ZESTRIL) 2.5 MG tablet, Take 1 tablet (2.5 mg total) by mouth daily., Disp: 30 tablet, Rfl: 11 .  lovastatin (MEVACOR) 20 MG tablet, Take 20 mg by mouth daily at 6 PM., Disp: , Rfl:  .  metFORMIN (GLUCOPHAGE) 500 MG tablet, Take 500 mg by mouth 2 (two) times daily with a  meal., Disp: , Rfl:  .  Multiple Vitamin (MULTI-VITAMINS) TABS, Take 1 tablet by mouth daily., Disp: , Rfl:  .  potassium chloride SA (K-DUR,KLOR-CON) 20 MEQ tablet, Take 1 tablet (20 mEq total) by mouth daily., Disp: 30 tablet, Rfl: 0 .  silver sulfADIAZINE (SILVADENE) 1 % cream, Apply to affected area daily, Disp: 50 g, Rfl: 1  Past Medical History: Past Medical History:  Diagnosis Date  . Cancer ([place])    bone marrow, prostate cancer  . Diabetes mellitus without complication ([place])   . Hyperlipidemia   . MI (mitral incompetence)   . MI (myocardial infarction) ([place])     Tobacco Use: Social History   Tobacco Use  Smoking Status Former Smoker  . Last attempt to quit: 2015  . 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   Exercise Prescription Goal: Initial exercise prescription builds to 30-45 minutes a day of aerobic activity, 2-3 days per week.  Home exercise guidelines will be given to patient during program as part of exercise prescription that the participant will acknowledge.  Activity Barriers & Risk Stratification: Activity Barriers & Cardiac Risk Stratification - 12/05/17 1255      Activity Barriers & Cardiac Risk Stratification   Activity Barriers  Back Problems;Balance Concerns;Assistive Device;Muscular Weakness;Deconditioning    Cardiac Risk Stratification  High       6 Minute Walk: 6 Minute Walk    Row Name 12/05/17 1418         6 Minute Walk   Phase   Initial     Distance  1068 feet     Walk Time  6 minutes     # of Rest Breaks  0     MPH  2.02     METS  1.79     RPE  13     VO2 Peak  6.27     Symptoms  No     Resting HR  71 bpm     Resting BP  132/74     Resting Oxygen Saturation   99 %     Exercise Oxygen Saturation  during 6 min walk  100 %     Max Ex. HR  96 bpm     Max Ex. BP  136/66     2 Minute Post BP  112/66        Oxygen Initial Assessment:   Oxygen Re-Evaluation:   Oxygen Discharge (Final Oxygen Re-Evaluation):   Initial Exercise Prescription: Initial Exercise Prescription - 12/05/17 1400      Date of Initial Exercise RX and Referring Provider   Date  12/05/17    Referring Provider  Kathlyn Sacramento MD      NuStep   Level  1    SPM  80    Minutes  15    METs  1.7      Arm Ergometer   Level  1    Watts  21    RPM  25    Minutes  15    METs  1.7      Track   Laps  25    Minutes  15    METs  2.15      Prescription Details   Frequency (times per week)  3    Duration  Progress to 45 minutes of aerobic exercise without signs/symptoms of physical distress      Intensity   THRR 40-80% of Max Heartrate  97-122    Ratings of Perceived Exertion  11-13    Perceived Dyspnea  0-4      Progression   Progression  Continue to progress workloads to maintain intensity without signs/symptoms of physical distress.      Resistance Training   Training Prescription  Yes    Weight  3 lbs    Reps  10-15       Perform Capillary Blood Glucose checks as needed.  Exercise Prescription Changes: Exercise Prescription Changes    Row Name 12/05/17 1200             Response to Exercise   Blood Pressure (Admit)  132/74       Blood Pressure (Exercise)  136/66  Blood Pressure (Exit)  112/66       Heart Rate (Admit)  71 bpm       Heart Rate (Exercise)  96 bpm       Heart Rate (Exit)  65 bpm       Oxygen Saturation (Admit)  99 %       Oxygen Saturation (Exit)  100 %       Rating of Perceived  Exertion (Exercise)  13       Symptoms  none       Comments  walk test results          Exercise Comments: Exercise Comments    Row Name 12/12/17 1730           Exercise Comments  First full day of exercise!  Patient was oriented to gym and equipment including functions, settings, policies, and procedures.  Patient's individual exercise prescription and treatment plan were reviewed.  All starting workloads were established based on the results of the 6 minute walk test done at initial orientation visit.  The plan for exercise progression was also introduced and progression will be customized based on patient's performance and goals.          Exercise Goals and Review: Exercise Goals    Row Name 12/05/17 1425             Exercise Goals   Increase Physical Activity  Yes       Intervention  Provide advice, education, support and counseling about physical activity/exercise needs.;Develop an individualized exercise prescription for aerobic and resistive training based on initial evaluation findings, risk stratification, comorbidities and participant's personal goals.       Expected Outcomes  Achievement of increased cardiorespiratory fitness and enhanced flexibility, muscular endurance and strength shown through measurements of functional capacity and personal statement of participant.       Increase Strength and Stamina  Yes       Intervention  Provide advice, education, support and counseling about physical activity/exercise needs.;Develop an individualized exercise prescription for aerobic and resistive training based on initial evaluation findings, risk stratification, comorbidities and participant's personal goals.       Expected Outcomes  Achievement of increased cardiorespiratory fitness and enhanced flexibility, muscular endurance and strength shown through measurements of functional capacity and personal statement of participant.       Able to understand and use rate of perceived  exertion (RPE) scale  Yes       Intervention  Provide education and explanation on how to use RPE scale       Expected Outcomes  Short Term: Able to use RPE daily in rehab to express subjective intensity level;Long Term:  Able to use RPE to guide intensity level when exercising independently       Knowledge and understanding of Target Heart Rate Range (THRR)  Yes       Intervention  Provide education and explanation of THRR including how the numbers were predicted and where they are located for reference       Expected Outcomes  Short Term: Able to state/look up THRR;Long Term: Able to use THRR to govern intensity when exercising independently;Short Term: Able to use daily as guideline for intensity in rehab       Able to check pulse independently  Yes       Intervention  Provide education and demonstration on how to check pulse in carotid and radial arteries.;Review the importance of being able to check your own pulse  for safety during independent exercise       Expected Outcomes  Long Term: Able to check pulse independently and accurately;Short Term: Able to explain why pulse checking is important during independent exercise       Understanding of Exercise Prescription  Yes       Intervention  Provide education, explanation, and written materials on patient's individual exercise prescription       Expected Outcomes  Short Term: Able to explain program exercise prescription;Long Term: Able to explain home exercise prescription to exercise independently          Exercise Goals Re-Evaluation : Exercise Goals Re-Evaluation    Row Name 12/12/17 1731             Exercise Goal Re-Evaluation   Exercise Goals Review  Increase Physical Activity;Increase Strength and Stamina;Able to understand and use rate of perceived exertion (RPE) scale;Knowledge and understanding of Target Heart Rate Range (THRR)       Comments  Reviewed RPE scale, THR and program prescription with pt today.  Pt voiced  understanding and was given a copy of goals to take home.        Expected Outcomes  Short: Use RPE daily to regulate intensity.  Long: Follow program prescription in THR.          Discharge Exercise Prescription (Final Exercise Prescription Changes): Exercise Prescription Changes - 12/05/17 1200      Response to Exercise   Blood Pressure (Admit)  132/74    Blood Pressure (Exercise)  136/66    Blood Pressure (Exit)  112/66    Heart Rate (Admit)  71 bpm    Heart Rate (Exercise)  96 bpm    Heart Rate (Exit)  65 bpm    Oxygen Saturation (Admit)  99 %    Oxygen Saturation (Exit)  100 %    Rating of Perceived Exertion (Exercise)  13    Symptoms  none    Comments  walk test results       Nutrition:  Target Goals: Understanding of nutrition guidelines, daily intake of sodium <1553m, cholesterol <2057m calories 30% from fat and 7% or less from saturated fats, daily to have 5 or more servings of fruits and vegetables.  Biometrics: Pre Biometrics - 12/05/17 1426      Pre Biometrics   Height  5' 7.2" (1.707 m)    Weight  163 lb 9.6 oz (74.2 kg)    Waist Circumference  35 inches    Hip Circumference  40 inches    Waist to Hip Ratio  0.88 %    BMI (Calculated)  25.47    Single Leg Stand  3.14 seconds        Nutrition Therapy Plan and Nutrition Goals: Nutrition Therapy & Goals - 12/05/17 1252      Intervention Plan   Intervention  Prescribe, educate and counsel regarding individualized specific dietary modifications aiming towards targeted core components such as weight, hypertension, lipid management, diabetes, heart failure and other comorbidities.;Nutrition handout(s) given to patient.    Expected Outcomes  Short Term Goal: Understand basic principles of dietary content, such as calories, fat, sodium, cholesterol and nutrients.;Short Term Goal: A plan has been developed with personal nutrition goals set during dietitian appointment.;Long Term Goal: Adherence to prescribed  nutrition plan.       Nutrition Assessments: Nutrition Assessments - 12/05/17 1219      MEDFICTS Scores   Pre Score  -- will bring paperwork to first day of class  Nutrition Goals Re-Evaluation:   Nutrition Goals Discharge (Final Nutrition Goals Re-Evaluation):   Psychosocial: Target Goals: Acknowledge presence or absence of significant depression and/or stress, maximize coping skills, provide positive support system. Participant is able to verbalize types and ability to use techniques and skills needed for reducing stress and depression.   Initial Review & Psychosocial Screening: Initial Psych Review & Screening - 12/05/17 1249      Initial Review   Current issues with  Current Sleep Concerns;Current Stress Concerns    Source of Stress Concerns  Chronic Illness;Family    Comments  Bill's wife is having memory issues and is causing stress at home. He also has chemo every Thursday for bone cancer, he states that his doctor told him cancer won't kill him but his heart will give out first. He says he is content with that, just trying to live his best life.       Family Dynamics   Good Support System?  Yes wife, daughter      Barriers   Psychosocial barriers to participate in program  There are no identifiable barriers or psychosocial needs.      Screening Interventions   Interventions  Yes;Encouraged to exercise;Program counselor consult    Expected Outcomes  Short Term goal: Utilizing psychosocial counselor, staff and physician to assist with identification of specific Stressors or current issues interfering with healing process. Setting desired goal for each stressor or current issue identified.;Long Term Goal: Stressors or current issues are controlled or eliminated.;Short Term goal: Identification and review with participant of any Quality of Life or Depression concerns found by scoring the questionnaire.;Long Term goal: The participant improves quality of Life and PHQ9  Scores as seen by post scores and/or verbalization of changes       Quality of Life Scores:  Quality of Life - 12/05/17 1252      Quality of Life Scores   Health/Function Pre  -- will bring paperwork to first day of class      Scores of 19 and below usually indicate a poorer quality of life in these areas.  A difference of  2-3 points is a clinically meaningful difference.  A difference of 2-3 points in the total score of the Quality of Life Index has been associated with significant improvement in overall quality of life, self-image, physical symptoms, and general health in studies assessing change in quality of life.  PHQ-9: Recent Review Flowsheet Data    Depression screen [facility] 2/9 12/05/2017   Decreased Interest 0   Down, Depressed, Hopeless 0   PHQ - 2 Score 0   Altered sleeping 1   Tired, decreased energy 1   Change in appetite 0   Feeling bad or failure about yourself  0   Trouble concentrating 0   Moving slowly or fidgety/restless 0   Suicidal thoughts 0   PHQ-9 Score 2   Difficult doing work/chores Not difficult at all     Interpretation of Total Score  Total Score Depression Severity:  1-4 = Minimal depression, 5-9 = Mild depression, 10-14 = Moderate depression, 15-19 = Moderately severe depression, 20-27 = Severe depression   Psychosocial Evaluation and Intervention:   Psychosocial Re-Evaluation:   Psychosocial Discharge (Final Psychosocial Re-Evaluation):   Vocational Rehabilitation: Provide vocational rehab assistance to qualifying candidates.   Vocational Rehab Evaluation & Intervention: Vocational Rehab - 12/05/17 1254      Initial Vocational Rehab Evaluation & Intervention   Assessment shows need for Vocational Rehabilitation  No  Education: Education Goals: Education classes will be provided on a variety of topics geared toward better understanding of heart health and risk factor modification. Participant will state understanding/return  demonstration of topics presented as noted by education test scores.  Learning Barriers/Preferences: Learning Barriers/Preferences - 12/05/17 1253      Learning Barriers/Preferences   Learning Barriers  Hearing    Learning Preferences  Individual Instruction       Education Topics:  AED/CPR: - Group verbal and written instruction with the use of models to demonstrate the basic use of the AED with the basic ABC's of resuscitation.   General Nutrition Guidelines/Fats and Fiber: -Group instruction provided by verbal, written material, models and posters to present the general guidelines for heart healthy nutrition. Gives an explanation and review of dietary fats and fiber.   Cardiac Rehab from 05/26/2015 in [facility] Cardiac and Pulmonary Rehab  Date  05/19/15  Educator  CR  Instruction Review Code (retired)  2- meets goals/outcomes      Controlling Sodium/Reading Food Labels: -Group verbal and written material supporting the discussion of sodium use in heart healthy nutrition. Review and explanation with models, verbal and written materials for utilization of the food label.   Cardiac Rehab from 05/26/2015 in [facility] Cardiac and Pulmonary Rehab  Date  05/26/15  Educator  c. Joneen Caraway  Instruction Review Code (retired)  2- meets goals/outcomes      Exercise Physiology & General Exercise Guidelines: - Group verbal and written instruction with models to review the exercise physiology of the cardiovascular system and associated critical values. Provides general exercise guidelines with specific guidelines to those with heart or lung disease.    Aerobic Exercise & Resistance Training: - Gives group verbal and written instruction on the various components of exercise. Focuses on aerobic and resistive training programs and the benefits of this training and how to safely progress through these programs..   Cardiac Rehab from 05/26/2015 in [facility] Cardiac and Pulmonary Rehab  Date  04/21/15  Educator   Dorisann Frames  Instruction Review Code (retired)  2- Statistician, Balance, Mind/Body Relaxation: Provides group verbal/written instruction on the benefits of flexibility and balance training, including mind/body exercise modes such as yoga, pilates and tai chi.  Demonstration and skill practice provided.   Stress and Anxiety: - Provides group verbal and written instruction about the health risks of elevated stress and causes of high stress.  Discuss the correlation between heart/lung disease and anxiety and treatment options. Review healthy ways to manage with stress and anxiety.   Cardiac Rehab from 05/26/2015 in [facility] Cardiac and Pulmonary Rehab  Date  04/09/15  Educator  Lucianne Lei, LCSW  Instruction Review Code (retired)  2- meets goals/outcomes      Depression: - Provides group verbal and written instruction on the correlation between heart/lung disease and depressed mood, treatment options, and the stigmas associated with seeking treatment.   Anatomy & Physiology of the Heart: - Group verbal and written instruction and models provide basic cardiac anatomy and physiology, with the coronary electrical and arterial systems. Review of Valvular disease and Heart Failure   Cardiac Rehab from 05/26/2015 in [facility] Cardiac and Pulmonary Rehab  Date  05/05/15  Educator  SB  Instruction Review Code (retired)  2- meets goals/outcomes      Cardiac Procedures: - Group verbal and written instruction to review commonly prescribed medications for heart disease. Reviews the medication, class of the drug, and side effects. Includes the steps  to properly store meds and maintain the prescription regimen. (beta blockers and nitrates)   Cardiac Medications I: - Group verbal and written instruction to review commonly prescribed medications for heart disease. Reviews the medication, class of the drug, and side effects. Includes the steps to properly store meds and maintain  the prescription regimen.   Cardiac Rehab from 12/12/2017 in [facility] Cardiac and Pulmonary Rehab  Date  12/12/17  Educator  [facility]  Instruction Review Code  1- Verbalizes Understanding      Cardiac Medications II: -Group verbal and written instruction to review commonly prescribed medications for heart disease. Reviews the medication, class of the drug, and side effects. (all other drug classes)    Go Sex-Intimacy & Heart Disease, Get SMART - Goal Setting: - Group verbal and written instruction through game format to discuss heart disease and the return to sexual intimacy. Provides group verbal and written material to discuss and apply goal setting through the application of the S.M.A.R.T. Method.   Other Matters of the Heart: - Provides group verbal, written materials and models to describe Stable Angina and Peripheral Artery. Includes description of the disease process and treatment options available to the cardiac patient.   Exercise & Equipment Safety: - Individual verbal instruction and demonstration of equipment use and safety with use of the equipment.   Cardiac Rehab from 12/12/2017 in [facility] Cardiac and Pulmonary Rehab  Date  12/05/17  Educator  [facility]  Instruction Review Code  1- Verbalizes Understanding      Infection Prevention: - Provides verbal and written material to individual with discussion of infection control including proper hand washing and proper equipment cleaning during exercise session.   Cardiac Rehab from 12/12/2017 in [facility] Cardiac and Pulmonary Rehab  Date  12/05/17  Educator  [facility]  Instruction Review Code  1- Verbalizes Understanding      Falls Prevention: - Provides verbal and written material to individual with discussion of falls prevention and safety.   Cardiac Rehab from 12/12/2017 in [facility] Cardiac and Pulmonary Rehab  Date  12/05/17  Educator  [facility]  Instruction Review Code  1- Verbalizes Understanding      Diabetes: - Individual verbal and written  instruction to review signs/symptoms of diabetes, desired ranges of glucose level fasting, after meals and with exercise. Acknowledge that pre and post exercise glucose checks will be done for 3 sessions at entry of program.   Cardiac Rehab from 12/12/2017 in [facility] Cardiac and Pulmonary Rehab  Date  12/05/17  Educator  [facility]  Instruction Review Code  1- Verbalizes Understanding      Know Your Numbers and Risk Factors: -Group verbal and written instruction about important numbers in your health.  Discussion of what are risk factors and how they play a role in the disease process.  Review of Cholesterol, Blood Pressure, Diabetes, and BMI and the role they play in your overall health.   Sleep Hygiene: -Provides group verbal and written instruction about how sleep can affect your health.  Define sleep hygiene, discuss sleep cycles and impact of sleep habits. Review good sleep hygiene tips.    Other: -Provides group and verbal instruction on various topics (see comments)   Knowledge Questionnaire Score: Knowledge Questionnaire Score - 12/05/17 1254      Knowledge Questionnaire Score   Pre Score  -- will bring questionnaire first day of class       Core Components/Risk Factors/Patient Goals at Admission: Personal Goals and Risk Factors at Admission - 12/05/17 1212  Core Components/Risk Factors/Patient Goals on Admission    Weight Management  Weight Maintenance;Yes    Intervention  Weight Management: Develop a combined nutrition and exercise program designed to reach desired caloric intake, while maintaining appropriate intake of nutrient and fiber, sodium and fats, and appropriate energy expenditure required for the weight goal.;Weight Management: Provide education and appropriate resources to help participant work on and attain dietary goals. Bill wishes to stay around 160 lb     Admit Weight  163 lb (73.9 kg)    Goal Weight: Short Term  160 lb (72.6 kg)    Goal Weight: Long Term  158  lb (71.7 kg)    Expected Outcomes  Short Term: Continue to assess and modify interventions until short term weight is achieved;Long Term: Adherence to nutrition and physical activity/exercise program aimed toward attainment of established weight goal;Weight Maintenance: Understanding of the daily nutrition guidelines, which includes 25-35% calories from fat, 7% or less cal from saturated fats, less than 263m cholesterol, less than 1.5gm of sodium, & 5 or more servings of fruits and vegetables daily;Understanding recommendations for meals to include 15-35% energy as protein, 25-35% energy from fat, 35-60% energy from carbohydrates, less than 2029mof dietary cholesterol, 20-35 gm of total fiber daily;Understanding of distribution of calorie intake throughout the day with the consumption of 4-5 meals/snacks    Diabetes  Yes    Intervention  Provide education about signs/symptoms and action to take for hypo/hyperglycemia.;Provide education about proper nutrition, including hydration, and aerobic/resistive exercise prescription along with prescribed medications to achieve blood glucose in normal ranges: Fasting glucose 65-99 mg/dL    Expected Outcomes  Short Term: Participant verbalizes understanding of the signs/symptoms and immediate care of hyper/hypoglycemia, proper foot care and importance of medication, aerobic/resistive exercise and nutrition plan for blood glucose control.;Long Term: Attainment of HbA1C < 7%.    Heart Failure  Yes    Intervention  Provide a combined exercise and nutrition program that is supplemented with education, support and counseling about heart failure. Directed toward relieving symptoms such as shortness of breath, decreased exercise tolerance, and extremity edema.    Expected Outcomes  Improve functional capacity of life;Short term: Attendance in program 2-3 days a week with increased exercise capacity. Reported lower sodium intake. Reported increased fruit and vegetable intake.  Reports medication compliance.;Short term: Daily weights obtained and reported for increase. Utilizing diuretic protocols set by physician.;Long term: Adoption of self-care skills and reduction of barriers for early signs and symptoms recognition and intervention leading to self-care maintenance.    Hypertension  Yes    Intervention  Provide education on lifestyle modifcations including regular physical activity/exercise, weight management, moderate sodium restriction and increased consumption of fresh fruit, vegetables, and low fat dairy, alcohol moderation, and smoking cessation.;Monitor prescription use compliance.    Expected Outcomes  Short Term: Continued assessment and intervention until BP is < 140/9028mG in hypertensive participants. < 130/87m45m in hypertensive participants with diabetes, heart failure or chronic kidney disease.;Long Term: Maintenance of blood pressure at goal levels.    Lipids  Yes    Intervention  Provide education and support for participant on nutrition & aerobic/resistive exercise along with prescribed medications to achieve LDL <70mg29mL >40mg.26mExpected Outcomes  Short Term: Participant states understanding of desired cholesterol values and is compliant with medications prescribed. Participant is following exercise prescription and nutrition guidelines.;Long Term: Cholesterol controlled with medications as prescribed, with individualized exercise RX and with personalized nutrition plan. Value goals: LDL <  19m, HDL > 40 mg.    Stress  Yes Wife of 61 years is starting to "lose memory" and he doesn't know why. He also has bone cancer which he receives chemo weekly for.     Intervention  Offer individual and/or small group education and counseling on adjustment to heart disease, stress management and health-related lifestyle change. Teach and support self-help strategies.;Refer participants experiencing significant psychosocial distress to appropriate mental health  specialists for further evaluation and treatment. When possible, include family members and significant others in education/counseling sessions.    Expected Outcomes  Short Term: Participant demonstrates changes in health-related behavior, relaxation and other stress management skills, ability to obtain effective social support, and compliance with psychotropic medications if prescribed.;Long Term: Emotional wellbeing is indicated by absence of clinically significant psychosocial distress or social isolation.       Core Components/Risk Factors/Patient Goals Review:    Core Components/Risk Factors/Patient Goals at Discharge (Final Review):    ITP Comments: ITP Comments    Row Name 12/05/17 1202 12/14/17 1545 12/21/17 1602 12/28/17 0644     ITP Comments  Med review completed. Initial ITP created. [facility]  Bill called to let uKoreaknow that he has not been feeling since Monday. He does not think he over did it, but wants to wait to return on Monday.   Mr. TNighhas been out since last Monday.  He doesn't think that it was his exercise.  He is also getting chemo treatments on Thursdays.  He is having a hard time getting around due to back pain and would like to rest for a few weeks while he thinks more about the program.  We will follow up with him again on the week of Feb 4th.   30 Day review. Continue with ITP unless directed changes per Medical Director review.  New to program       Comments:

## 2017-12-29 ENCOUNTER — Inpatient Hospital Stay
Admission: EM | Admit: 2017-12-29 | Discharge: 2017-12-31 | DRG: 291 | Disposition: A | Discharge status: Home / Self Care | Payer: Medicare HMO | Attending: Internal Medicine | Admitting: Internal Medicine

## 2017-12-29 ENCOUNTER — Emergency Department: Payer: Medicare HMO

## 2017-12-29 ENCOUNTER — Other Ambulatory Visit: Payer: Self-pay

## 2017-12-29 ENCOUNTER — Encounter: Payer: Self-pay | Admitting: *Deleted

## 2017-12-29 DIAGNOSIS — I248 Other forms of acute ischemic heart disease: Secondary | ICD-10-CM | POA: Diagnosis present

## 2017-12-29 DIAGNOSIS — Z7902 Long term (current) use of antithrombotics/antiplatelets: Secondary | ICD-10-CM

## 2017-12-29 DIAGNOSIS — Z951 Presence of aortocoronary bypass graft: Secondary | ICD-10-CM

## 2017-12-29 DIAGNOSIS — Z888 Allergy status to other drugs, medicaments and biological substances status: Secondary | ICD-10-CM

## 2017-12-29 DIAGNOSIS — I340 Nonrheumatic mitral (valve) insufficiency: Secondary | ICD-10-CM | POA: Diagnosis present

## 2017-12-29 DIAGNOSIS — N179 Acute kidney failure, unspecified: Secondary | ICD-10-CM

## 2017-12-29 DIAGNOSIS — R739 Hyperglycemia, unspecified: Secondary | ICD-10-CM

## 2017-12-29 DIAGNOSIS — Z7952 Long term (current) use of systemic steroids: Secondary | ICD-10-CM

## 2017-12-29 DIAGNOSIS — Z9111 Patient's noncompliance with dietary regimen: Secondary | ICD-10-CM

## 2017-12-29 DIAGNOSIS — C9000 Multiple myeloma not having achieved remission: Secondary | ICD-10-CM | POA: Diagnosis present

## 2017-12-29 DIAGNOSIS — E1165 Type 2 diabetes mellitus with hyperglycemia: Secondary | ICD-10-CM | POA: Diagnosis present

## 2017-12-29 DIAGNOSIS — I5023 Acute on chronic systolic (congestive) heart failure: Secondary | ICD-10-CM | POA: Diagnosis present

## 2017-12-29 DIAGNOSIS — N170 Acute kidney failure with tubular necrosis: Secondary | ICD-10-CM | POA: Diagnosis present

## 2017-12-29 DIAGNOSIS — E785 Hyperlipidemia, unspecified: Secondary | ICD-10-CM | POA: Diagnosis present

## 2017-12-29 DIAGNOSIS — Z955 Presence of coronary angioplasty implant and graft: Secondary | ICD-10-CM

## 2017-12-29 DIAGNOSIS — G4730 Sleep apnea, unspecified: Secondary | ICD-10-CM | POA: Diagnosis present

## 2017-12-29 DIAGNOSIS — C61 Malignant neoplasm of prostate: Secondary | ICD-10-CM | POA: Diagnosis present

## 2017-12-29 DIAGNOSIS — Z7984 Long term (current) use of oral hypoglycemic drugs: Secondary | ICD-10-CM

## 2017-12-29 DIAGNOSIS — J811 Chronic pulmonary edema: Secondary | ICD-10-CM | POA: Diagnosis present

## 2017-12-29 DIAGNOSIS — Z7982 Long term (current) use of aspirin: Secondary | ICD-10-CM

## 2017-12-29 DIAGNOSIS — Z87891 Personal history of nicotine dependence: Secondary | ICD-10-CM

## 2017-12-29 DIAGNOSIS — J810 Acute pulmonary edema: Secondary | ICD-10-CM | POA: Diagnosis not present

## 2017-12-29 DIAGNOSIS — I252 Old myocardial infarction: Secondary | ICD-10-CM

## 2017-12-29 DIAGNOSIS — I110 Hypertensive heart disease with heart failure: Secondary | ICD-10-CM | POA: Diagnosis not present

## 2017-12-29 DIAGNOSIS — I480 Paroxysmal atrial fibrillation: Secondary | ICD-10-CM | POA: Diagnosis present

## 2017-12-29 DIAGNOSIS — I2510 Atherosclerotic heart disease of native coronary artery without angina pectoris: Secondary | ICD-10-CM | POA: Diagnosis present

## 2017-12-29 DIAGNOSIS — Z952 Presence of prosthetic heart valve: Secondary | ICD-10-CM

## 2017-12-29 DIAGNOSIS — I447 Left bundle-branch block, unspecified: Secondary | ICD-10-CM | POA: Diagnosis present

## 2017-12-29 DIAGNOSIS — Z79899 Other long term (current) drug therapy: Secondary | ICD-10-CM

## 2017-12-29 DIAGNOSIS — R079 Chest pain, unspecified: Secondary | ICD-10-CM

## 2017-12-29 LAB — CBC
HCT: 34.2 % — ABNORMAL LOW (ref 40.0–52.0)
Hemoglobin: 11.3 g/dL — ABNORMAL LOW (ref 13.0–18.0)
MCH: 30.1 pg (ref 26.0–34.0)
MCHC: 32.9 g/dL (ref 32.0–36.0)
MCV: 91.5 fL (ref 80.0–100.0)
PLATELETS: 147 10*3/uL — AB (ref 150–440)
RBC: 3.74 MIL/uL — AB (ref 4.40–5.90)
RDW: 16.6 % — AB (ref 11.5–14.5)
WBC: 5.1 10*3/uL (ref 3.8–10.6)

## 2017-12-29 LAB — BASIC METABOLIC PANEL
Anion gap: 12 (ref 5–15)
BUN: 18 mg/dL (ref 6–20)
CO2: 18 mmol/L — ABNORMAL LOW (ref 22–32)
CREATININE: 1.54 mg/dL — AB (ref 0.61–1.24)
Calcium: 9.1 mg/dL (ref 8.9–10.3)
Chloride: 100 mmol/L — ABNORMAL LOW (ref 101–111)
GFR, EST AFRICAN AMERICAN: 46 mL/min — AB (ref >60)
GFR, EST NON AFRICAN AMERICAN: 39 mL/min — AB (ref >60)
Glucose, Bld: 393 mg/dL — ABNORMAL HIGH (ref 65–99)
Potassium: 4.5 mmol/L (ref 3.5–5.1)
Sodium: 130 mmol/L — ABNORMAL LOW (ref 135–145)

## 2017-12-29 LAB — PROTIME-INR
INR: 1.02
PROTHROMBIN TIME: 13.3 s (ref 11.4–15.2)

## 2017-12-29 LAB — TROPONIN I: TROPONIN I: 0.04 ng/mL — AB (ref <0.03)

## 2017-12-29 MED ORDER — METOPROLOL TARTRATE 5 MG/5ML IV SOLN
5.0000 mg | Freq: Once | INTRAVENOUS | Status: AC
  Administered 2017-12-29: 5 mg via INTRAVENOUS

## 2017-12-29 MED ORDER — CARVEDILOL 6.25 MG PO TABS
6.2500 mg | ORAL_TABLET | Freq: Two times a day (BID) | ORAL | Status: DC
  Administered 2017-12-29: 6.25 mg via ORAL

## 2017-12-29 MED ORDER — CARVEDILOL 6.25 MG PO TABS: 6.2500 mg | ORAL_TABLET | Freq: Two times a day (BID) | ORAL | Status: DC

## 2017-12-29 MED ORDER — CARVEDILOL 6.25 MG PO TABS
ORAL_TABLET | ORAL | Status: AC
  Filled 2017-12-29: qty 1

## 2017-12-29 MED ORDER — SODIUM CHLORIDE 0.9 % IV BOLUS (SEPSIS)
500.0000 mL | Freq: Once | INTRAVENOUS | Status: AC
  Administered 2017-12-29: 200 mL via INTRAVENOUS

## 2017-12-29 MED ORDER — FUROSEMIDE 10 MG/ML IJ SOLN
20.0000 mg | Freq: Once | INTRAMUSCULAR | Status: AC
  Administered 2017-12-30: 20 mg via INTRAVENOUS
  Filled 2017-12-29: qty 4

## 2017-12-29 NOTE — ED Notes (Signed)
Allergy to Nitroglycerin

## 2017-12-29 NOTE — ED Triage Notes (Signed)
Pt arrived via EMS. Good historian. States he stated having chest pain around 8pm. He took pepcid and states if it is indigestion it relieves it almost immediately. No relief so he took 4 baby aspirin and called EMS. 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 He also has Bone, Prostate and blood cancer and had chemo at the [place] today. Pain is 4/10 to mid upper chest

## 2017-12-29 NOTE — ED Notes (Signed)
Patient transported to X-ray 

## 2017-12-29 NOTE — ED Provider Notes (Addendum)
[facility] Emergency Department Provider Note  ____________________________________________  Time seen: Approximately 10:42 PM  I have reviewed the triage vital signs and the nursing notes.   HISTORY  Chief Complaint No chief complaint on file.   HPI Dwayne Terry is a 84 y.o. 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 At 8 PM he was having dumplings with extra salt when he developed chest pain. He describes the pain as a 500 pound elephant sitting on his chest associated with shortness of breath and nausea. the pain was in the center of his chest, constant and nonradiating.No dizziness. The pain lasted about an hour and has now resolved. He reports that the pain was identical to his most recent heart attack in November 2018. Patient is tachycardic but reports that he forgot to take his medications this morning including his beta blocker and only took them at 8 PM when his chest started to hurt. no fever, no chills, no current shortness of breath, no URI symptoms, no abdominal pain, no vomiting.  Past Medical History:  Diagnosis Date  . Cancer ([place])    bone marrow, prostate cancer  . Diabetes mellitus without complication ([place])   . Hyperlipidemia   . MI (mitral incompetence)   . MI (myocardial infarction) [facility])     Patient Active Problem List   Diagnosis Date Noted  . HLD (hyperlipidemia) 09/16/2017  . Diabetes ([place]) 09/16/2017  . Chest pain 04/29/2017  . NSTEMI (non-ST elevated myocardial infarction) ([place]) 04/29/2017  . Presence of coronary angioplasty implant and graft 03/21/2017  . Chest pain, rule out acute myocardial infarction 01/27/2017  . Dyspnea on exertion 09/06/2014  . Malignant neoplasm of prostate ([place]) 05/09/2014  . Atherosclerotic heart disease of native coronary artery without angina pectoris  07/10/2013  . Essential (primary) hypertension 07/10/2013    Past Surgical History:  Procedure Laterality Date  . AORTIC VALVE REPLACEMENT (AVR)/CORONARY ARTERY BYPASS GRAFTING (CABG)    . CORONARY ANGIOPLASTY WITH STENT PLACEMENT    . CORONARY BALLOON ANGIOPLASTY N/A 06/10/2017   Procedure: Coronary Balloon Angioplasty;  Surgeon: Wellington Hampshire, MD;  Location: [place] [facility];  Service: Cardiovascular;  Laterality: N/A;  . CORONARY STENT INTERVENTION N/A 01/28/2017   Procedure: Coronary Stent Intervention;  Surgeon: [name], MD;  Location: [place] [facility];  Service: Cardiovascular;  Laterality: N/A;  . CORONARY STENT INTERVENTION N/A 04/29/2017   Procedure: Coronary Stent Intervention;  Surgeon: [name], MD;  Location: [place] [facility];  Service: Cardiovascular;  Laterality: N/A;  . CORONARY STENT INTERVENTION N/A 09/19/2017   Procedure: CORONARY/GRAFT ANGIOGRAPHY;  Surgeon: Corey Skains, MD;  Location: [place] [facility];  Service: Cardiovascular;  Laterality: N/A;  . CORONARY STENT INTERVENTION N/A 09/19/2017   Procedure: CORONARY STENT INTERVENTION;  Surgeon: Wellington Hampshire, MD;  Location: [place] [facility];  Service: Cardiovascular;  Laterality: N/A;  . LEFT HEART CATH AND CORONARY ANGIOGRAPHY N/A 01/28/2017   Procedure: Left Heart Cath and Coronary Angiography;  Surgeon: [name], MD;  Location: [place] [facility];  Service: Cardiovascular;  Laterality: N/A;  . LEFT HEART CATH AND CORONARY ANGIOGRAPHY N/A 10/31/2017   Procedure: LEFT HEART CATH AND CORONARY ANGIOGRAPHY;  Surgeon: Teodoro Spray, MD;  Location: [place] [facility];  Service: Cardiovascular;  Laterality: N/A;  . LEFT HEART CATH AND CORS/GRAFTS ANGIOGRAPHY N/A 04/29/2017   Procedure:  Left Heart Cath and Cors/Grafts Angiography;  Surgeon: [name], MD;  Location: [place] [facility];  Service: Cardiovascular;  Laterality: N/A;  . LEFT HEART CATH AND  CORS/GRAFTS ANGIOGRAPHY N/A 06/10/2017   Procedure: Left Heart Cath and Cors/Grafts Angiography;  Surgeon: Corey Skains, MD;  Location: [place] [facility];  Service: Cardiovascular;  Laterality: N/A;  . LEFT HEART CATH AND CORS/GRAFTS ANGIOGRAPHY N/A 09/19/2017   Procedure: LEFT HEART CATH;  Surgeon: Corey Skains, MD;  Location: [place] [facility];  Service: Cardiovascular;  Laterality: N/A;    Prior to Admission medications   Medication Sig Start Date End Date Taking? Authorizing Provider  acetaminophen (TYLENOL) 325 MG tablet Take 650 mg by mouth every 6 (six) hours as needed.   Yes [provider]  acyclovir (ZOVIRAX) 200 MG capsule Take 200 mg by mouth 2 (two) times daily.   Yes [provider]  aspirin EC 81 MG tablet Take 1 tablet (81 mg total) by mouth daily. 01/29/17  Yes Loletha Grayer, MD  carvedilol (COREG) 6.25 MG tablet Take 1 tablet (6.25 mg total) by mouth 2 (two) times daily with a meal. Patient taking differently: Take 3.25 mg by mouth 2 (two) times daily with a meal.  01/29/17  Yes Leslye Peer, Richard, MD  clopidogrel (PLAVIX) 75 MG tablet Take 1 tablet (75 mg total) by mouth daily with breakfast. 04/30/17  Yes Sainani, Belia Heman, MD  dexamethasone (DECADRON) 2 MG tablet Take 8.5 mg by mouth See admin instructions. Take 8.5 mg on Thursday and Friday.   Yes [provider]  finasteride (PROSCAR) 5 MG tablet Take 2.5 mg by mouth daily.    Yes [provider]  furosemide (LASIX) 20 MG tablet Take 1 tablet (20 mg total) by mouth daily. 09/20/17 09/20/18 Yes Dustin Flock, MD  lansoprazole (PREVACID) 30 MG capsule Take 30 mg by mouth 2 (two) times daily as needed.  11/03/17 11/03/18 Yes [provider]  lisinopril (ZESTRIL) 2.5 MG tablet Take 1 tablet (2.5 mg total) by mouth daily. 09/20/17 09/20/18 Yes Dustin Flock, MD  lovastatin (MEVACOR) 20 MG tablet Take 20 mg by mouth daily at 6 PM.   Yes [provider]  metFORMIN  (GLUCOPHAGE) 500 MG tablet Take 500 mg by mouth 2 (two) times daily with a meal.   Yes [provider]  Multiple Vitamin (MULTI-VITAMINS) TABS Take 1 tablet by mouth daily.   Yes [provider]  potassium chloride SA (K-DUR,KLOR-CON) 20 MEQ tablet Take 1 tablet (20 mEq total) by mouth daily. 09/20/17  Yes Dustin Flock, MD  silver sulfADIAZINE (SILVADENE) 1 % cream Apply to affected area daily 08/03/17 08/03/18 Yes Duanne Guess, PA-C    Allergies Antihistamines, chlorpheniramine-type and Nitroglycerin  Family History  Problem Relation Age of Onset  . Heart disease Other     Social History Social History   Tobacco Use  . Smoking status: Former Smoker    Last attempt to quit: 2015    Years since quitting: 4.0  . Smokeless tobacco: Former Network engineer Use Topics  . Alcohol use: No  . Drug use: No    Review of Systems  Constitutional: Negative for fever. Eyes: Negative for visual changes. ENT: Negative for sore throat. Neck: No neck pain  Cardiovascular: + chest pain. Respiratory: + shortness of breath. Gastrointestinal: Negative for abdominal pain, vomiting or diarrhea. Genitourinary: Negative for dysuria. Musculoskeletal: Negative for back pain. Skin: Negative for rash. Neurological: Negative for headaches, weakness or numbness.  Psych: No SI or HI  ____________________________________________   PHYSICAL EXAM:  VITAL SIGNS: ED Triage Vitals  Enc Vitals Group     BP 12/29/17 2113 (!) 132/92     Pulse Rate 12/29/17 2113 (!) 132     Resp 12/29/17 2113 (!) 23     Temp 12/29/17 2126 98.2 F (36.8 C)     Temp Source 12/29/17 2126 Oral     SpO2 12/29/17 2113 96 %     Weight 12/29/17 2113 158 lb (71.7 kg)     Height 12/29/17 2113 5\' 8"  (1.727 m)     Head Circumference --      Peak Flow --      Pain Score 12/29/17 2112 4     Pain Loc --      Pain Edu? --      Excl. in [place]? --     Constitutional: Alert and oriented. Well appearing and in no  apparent distress. HEENT:      Head: Normocephalic and atraumatic.         Eyes: Conjunctivae are normal. Sclera is non-icteric.       Mouth/Throat: Mucous membranes are moist.       Neck: Supple with no signs of meningismus. Cardiovascular: tachycardic with regular rhythm. No murmurs, gallops, or rubs. 2+ symmetrical distal pulses are present in all extremities. No JVD. Respiratory: increased work of breathing and slightly dyspneic but clear lungs with good air movement Gastrointestinal: Soft, non tender, and non distended with positive bowel sounds. No rebound or guarding. Musculoskeletal: Nontender with normal range of motion in all extremities. No edema, cyanosis, or erythema of extremities. Neurologic: Normal speech and language. Face is symmetric. Moving all extremities. No gross focal neurologic deficits are appreciated. Skin: Skin is warm, dry and intact. No rash noted. Psychiatric: Mood and affect are normal. Speech and behavior are normal.  ____________________________________________   LABS (all labs ordered are listed, but only abnormal results are displayed)  Labs Reviewed  BASIC METABOLIC PANEL - Abnormal; Notable for the following components:      Result Value   Sodium 130 (*)    Chloride 100 (*)    CO2 18 (*)    Glucose, Bld 393 (*)    Creatinine, Ser 1.54 (*)    GFR calc non Af Amer 39 (*)    GFR calc Af Amer 46 (*)    All other components within normal limits  CBC - Abnormal; Notable for the following components:   RBC 3.74 (*)    Hemoglobin 11.3 (*)    HCT 34.2 (*)    RDW 16.6 (*)    Platelets 147 (*)    All other components within normal limits  TROPONIN I - Abnormal; Notable for the following components:   Troponin I 0.04 (*)    All other components within normal limits  PROTIME-INR   ____________________________________________  EKG  ED ECG REPORT I, Rudene Re, the attending physician, personally viewed and interpreted this ECG.  Sinus  tachycardia, rate of 129, left bundle branch block, prolonged QTC, right axis deviation, deep T-wave inversions in V5 and V6, ST depression in I, V5 and V6 with no ST elevation. Depressions are new, LBBB is old ____________________________________________  RADIOLOGY  Interpreted by me: CXR: pulmonary edema, questionable pneumoperitoneum. Radiology recommended a CT abdomen and pelvis  CT c/a/p: pulmonary edema, no pneumoperitoneum   Interpretation by Radiologist:  Ct Abdomen Pelvis Wo Contrast  Result Date: 12/29/2017 CLINICAL DATA:  84 y/o M; chest  pain with history of bone, prostate, and blood cancer on chemotherapy. EXAM: CT CHEST, ABDOMEN AND PELVIS WITHOUT CONTRAST TECHNIQUE: Multidetector CT imaging of the chest, abdomen and pelvis was performed following the standard protocol without IV contrast. COMPARISON:  None. FINDINGS: CT CHEST FINDINGS Cardiovascular: Normal heart size. No pericardial effusion. Status post CABG with LIMA and saphenous grafts. Severe coronary artery calcification. Aortic valvular calcification associated with aortic stenosis. Normal caliber thoracic aorta and main pulmonary artery. Calcific atherosclerosis of aorta. Mediastinum/Nodes: No enlarged mediastinal, hilar, or axillary lymph nodes. Thyroid gland, trachea, and esophagus demonstrate no significant findings. Lungs/Pleura: Smooth interlobular septal thickening and mild peribronchial thickening compatible with interstitial pulmonary edema. No alveolar pulmonary edema. No pleural effusion. No pneumothorax. Coarse reticulation of the lung peripheries greatest at lung bases, probably a component of mild pulmonary fibrosis. Musculoskeletal: Healed median sternotomy. Multiple chronic rib fractures bilaterally. No acute fracture or destructive bony lesion identified. CT ABDOMEN PELVIS FINDINGS Hepatobiliary: Liver segment 2 subcentimeter cyst. Otherwise no focal liver lesion identified. Cholelithiasis. No biliary ductal  dilatation. Pancreas: Unremarkable. No pancreatic ductal dilatation or surrounding inflammatory changes. Spleen: Normal in size without focal abnormality. Adrenals/Urinary Tract: Adrenal glands are unremarkable. Kidneys are normal, without renal calculi, focal lesion, or hydronephrosis. Bladder is unremarkable. Stomach/Bowel: Stomach is within normal limits. Appendix appears normal. No evidence of bowel wall thickening, distention, or inflammatory changes. Mild sigmoid diverticulosis. Vascular/Lymphatic: Aortic atherosclerosis. No enlarged abdominal or pelvic lymph nodes. Reproductive: Mild prostate enlargement. Other: No abdominal wall hernia or abnormality. No abdominopelvic ascites. Musculoskeletal: No acute fracture or destructive bony lesion identified. Mild multilevel discogenic degenerative changes of the thoracolumbar spine and moderate lower lumbar facet arthrosis. No high-grade bony canal stenosis. IMPRESSION: 1. Interstitial pulmonary edema. 2. Probable superimposed mild pulmonary fibrosis. 3. Severe coronary artery calcification.  Status post CABG. 4. Aortic valvular calcification associated with aortic stenosis. 5. Aortic atherosclerosis with extensive infrarenal calcification. 6. Cholelithiasis. 7. Sigmoid diverticulosis. Electronically Signed   By: Kristine Garbe M.D.   On: 12/29/2017 22:49   Dg Chest 2 View  Result Date: 12/29/2017 CLINICAL DATA:  Chest pain. History of MI, status post open heart surgery in 1989. EXAM: CHEST  2 VIEW COMPARISON:  Chest x-rays dated 10/28/2017 and 01/27/2017. FINDINGS: Heart size upper normal, stable. Overall cardiomediastinal silhouette is stable in size and configuration. Median sternotomy wires appear intact and stable in alignment. Coarse lung markings noted bilaterally, increased compared to previous study, most suggestive of interstitial edema superimposed on chronic interstitial lung disease/fibrosis. Lucency is seen along the medial aspects of the  left hemidiaphragm, suspicious for free intraperitoneal air. IMPRESSION: 1. Lucency along the medial aspect of the left hemidiaphragm, of uncertain etiology, possibly free intraperitoneal air within the upper abdomen. Recommend CT abdomen for further characterization. 2. Increasingly coarse lung markings bilaterally, most likely interstitial edema superimposed on worsening chronic interstitial lung disease/fibrosis. These results were called by telephone at the time of interpretation on 12/29/2017 at 9:53 pm to Dr. Rudene Re , who verbally acknowledged these results. Electronically Signed   By: Franki Cabot M.D.   On: 12/29/2017 21:54   Ct Chest Wo Contrast  Result Date: 12/29/2017 CLINICAL DATA:  84 y/o M; chest pain with history of bone, prostate, and blood cancer on chemotherapy. EXAM: CT CHEST, ABDOMEN AND PELVIS WITHOUT CONTRAST TECHNIQUE: Multidetector CT imaging of the chest, abdomen and pelvis was performed following the standard protocol without IV contrast. COMPARISON:  None. FINDINGS: CT CHEST FINDINGS Cardiovascular: Normal heart size. No pericardial effusion. Status  post CABG with LIMA and saphenous grafts. Severe coronary artery calcification. Aortic valvular calcification associated with aortic stenosis. Normal caliber thoracic aorta and main pulmonary artery. Calcific atherosclerosis of aorta. Mediastinum/Nodes: No enlarged mediastinal, hilar, or axillary lymph nodes. Thyroid gland, trachea, and esophagus demonstrate no significant findings. Lungs/Pleura: Smooth interlobular septal thickening and mild peribronchial thickening compatible with interstitial pulmonary edema. No alveolar pulmonary edema. No pleural effusion. No pneumothorax. Coarse reticulation of the lung peripheries greatest at lung bases, probably a component of mild pulmonary fibrosis. Musculoskeletal: Healed median sternotomy. Multiple chronic rib fractures bilaterally. No acute fracture or destructive bony lesion  identified. CT ABDOMEN PELVIS FINDINGS Hepatobiliary: Liver segment 2 subcentimeter cyst. Otherwise no focal liver lesion identified. Cholelithiasis. No biliary ductal dilatation. Pancreas: Unremarkable. No pancreatic ductal dilatation or surrounding inflammatory changes. Spleen: Normal in size without focal abnormality. Adrenals/Urinary Tract: Adrenal glands are unremarkable. Kidneys are normal, without renal calculi, focal lesion, or hydronephrosis. Bladder is unremarkable. Stomach/Bowel: Stomach is within normal limits. Appendix appears normal. No evidence of bowel wall thickening, distention, or inflammatory changes. Mild sigmoid diverticulosis. Vascular/Lymphatic: Aortic atherosclerosis. No enlarged abdominal or pelvic lymph nodes. Reproductive: Mild prostate enlargement. Other: No abdominal wall hernia or abnormality. No abdominopelvic ascites. Musculoskeletal: No acute fracture or destructive bony lesion identified. Mild multilevel discogenic degenerative changes of the thoracolumbar spine and moderate lower lumbar facet arthrosis. No high-grade bony canal stenosis. IMPRESSION: 1. Interstitial pulmonary edema. 2. Probable superimposed mild pulmonary fibrosis. 3. Severe coronary artery calcification.  Status post CABG. 4. Aortic valvular calcification associated with aortic stenosis. 5. Aortic atherosclerosis with extensive infrarenal calcification. 6. Cholelithiasis. 7. Sigmoid diverticulosis. Electronically Signed   By: Kristine Garbe M.D.   On: 12/29/2017 22:49    ____________________________________________   PROCEDURES  Procedure(s) performed: None Procedures Critical Care performed:  None ____________________________________________   INITIAL IMPRESSION / ASSESSMENT AND PLAN / ED COURSE  84 y.o. 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  patient's description of his pain concerning for ACS. He is asymptomatic at this time. He is very tachycardic however has forgotten to take his beta blocker today. Although the patient has cancer and is tachycardic I don't believe he has a PE since his symptoms have fully resolved. CXR showing worsening pulmonary edema and questionable pneumoperitoneum. Patient looks euvolemic. Patient has no abdominal tenderness however radiology has recommended a CT. I will send him for CT chest since x-ray shows worse than the patient's baseline to rule out pneumonia. We'll also send him for CT abdomen and pelvis to rule out pneumoperitoneum. We'll avoid contrast since patient has acute kidney injury and may need to go to the catheter lab where he will receive another load of contrast. His first troponin is 0.04.I have ordered patient's Coreg, I will give him a low dose of IV metoprolol and a bolus of 500 cc for his AKI and tachycardia. anticipate admission.  Clinical Course as of Dec 29 2317  Thu Dec 29, 2017  2315 Patient with AKI, pulmonary edema, new ST depressions, hyperglycemia, first troponin 0.04. HR improved with BB. Patient given 200cc IVF for AKI and hyperglycemia. After CT concerning for pulmonary edema he was then given 20mg  of IV lasix. Discussed with Hospitalist for admission.   [CV]    Clinical Course User Index [CV] Alfred Levins [place], MD     As part of my medical decision making, I reviewed the following data within  the [facility] notes reviewed and incorporated, Labs reviewed , EKG interpreted , Old EKG reviewed, Old chart reviewed, Radiograph reviewed , Discussed with admitting physician , Notes from prior ED visits and  Controlled Substance Database    Pertinent labs & imaging results that were available during my care of the patient were reviewed by me and considered in my medical decision making (see chart for  details).    ____________________________________________   FINAL CLINICAL IMPRESSION(S) / ED DIAGNOSES  Final diagnoses:  Chest pain, unspecified type  Acute pulmonary edema ([place])  AKI (acute kidney injury) ([place])  Hyperglycemia      NEW MEDICATIONS STARTED DURING THIS VISIT:  ED Discharge Orders    None       Note:  This document was prepared using Dragon voice recognition software and may include unintentional dictation errors.    Rudene Re, MD 12/29/17 [place], [place], [place] 12/29/17 [phone]

## 2017-12-30 ENCOUNTER — Other Ambulatory Visit: Payer: Self-pay

## 2017-12-30 DIAGNOSIS — Z9111 Patient's noncompliance with dietary regimen: Secondary | ICD-10-CM | POA: Diagnosis not present

## 2017-12-30 DIAGNOSIS — Z951 Presence of aortocoronary bypass graft: Secondary | ICD-10-CM | POA: Diagnosis not present

## 2017-12-30 DIAGNOSIS — I480 Paroxysmal atrial fibrillation: Secondary | ICD-10-CM | POA: Diagnosis present

## 2017-12-30 DIAGNOSIS — I447 Left bundle-branch block, unspecified: Secondary | ICD-10-CM | POA: Diagnosis present

## 2017-12-30 DIAGNOSIS — E1165 Type 2 diabetes mellitus with hyperglycemia: Secondary | ICD-10-CM | POA: Diagnosis present

## 2017-12-30 DIAGNOSIS — J810 Acute pulmonary edema: Secondary | ICD-10-CM | POA: Diagnosis present

## 2017-12-30 DIAGNOSIS — I252 Old myocardial infarction: Secondary | ICD-10-CM | POA: Diagnosis not present

## 2017-12-30 DIAGNOSIS — Z7952 Long term (current) use of systemic steroids: Secondary | ICD-10-CM | POA: Diagnosis not present

## 2017-12-30 DIAGNOSIS — E785 Hyperlipidemia, unspecified: Secondary | ICD-10-CM | POA: Diagnosis present

## 2017-12-30 DIAGNOSIS — I110 Hypertensive heart disease with heart failure: Secondary | ICD-10-CM | POA: Diagnosis present

## 2017-12-30 DIAGNOSIS — Z7982 Long term (current) use of aspirin: Secondary | ICD-10-CM | POA: Diagnosis not present

## 2017-12-30 DIAGNOSIS — I248 Other forms of acute ischemic heart disease: Secondary | ICD-10-CM | POA: Diagnosis present

## 2017-12-30 DIAGNOSIS — I340 Nonrheumatic mitral (valve) insufficiency: Secondary | ICD-10-CM | POA: Diagnosis present

## 2017-12-30 DIAGNOSIS — C61 Malignant neoplasm of prostate: Secondary | ICD-10-CM | POA: Diagnosis present

## 2017-12-30 DIAGNOSIS — Z87891 Personal history of nicotine dependence: Secondary | ICD-10-CM | POA: Diagnosis not present

## 2017-12-30 DIAGNOSIS — Z952 Presence of prosthetic heart valve: Secondary | ICD-10-CM | POA: Diagnosis not present

## 2017-12-30 DIAGNOSIS — N170 Acute kidney failure with tubular necrosis: Secondary | ICD-10-CM | POA: Diagnosis present

## 2017-12-30 DIAGNOSIS — Z7984 Long term (current) use of oral hypoglycemic drugs: Secondary | ICD-10-CM | POA: Diagnosis not present

## 2017-12-30 DIAGNOSIS — I2510 Atherosclerotic heart disease of native coronary artery without angina pectoris: Secondary | ICD-10-CM | POA: Diagnosis present

## 2017-12-30 DIAGNOSIS — I5023 Acute on chronic systolic (congestive) heart failure: Secondary | ICD-10-CM | POA: Diagnosis present

## 2017-12-30 DIAGNOSIS — G4730 Sleep apnea, unspecified: Secondary | ICD-10-CM | POA: Diagnosis present

## 2017-12-30 DIAGNOSIS — Z7902 Long term (current) use of antithrombotics/antiplatelets: Secondary | ICD-10-CM | POA: Diagnosis not present

## 2017-12-30 DIAGNOSIS — J811 Chronic pulmonary edema: Secondary | ICD-10-CM | POA: Diagnosis present

## 2017-12-30 DIAGNOSIS — Z888 Allergy status to other drugs, medicaments and biological substances status: Secondary | ICD-10-CM | POA: Diagnosis not present

## 2017-12-30 DIAGNOSIS — Z955 Presence of coronary angioplasty implant and graft: Secondary | ICD-10-CM | POA: Diagnosis not present

## 2017-12-30 DIAGNOSIS — C9000 Multiple myeloma not having achieved remission: Secondary | ICD-10-CM | POA: Diagnosis present

## 2017-12-30 LAB — CBC
HCT: 28.7 % — ABNORMAL LOW (ref 40.0–52.0)
HEMOGLOBIN: 9.6 g/dL — AB (ref 13.0–18.0)
MCH: 30.6 pg (ref 26.0–34.0)
MCHC: 33.7 g/dL (ref 32.0–36.0)
MCV: 91 fL (ref 80.0–100.0)
PLATELETS: 110 10*3/uL — AB (ref 150–440)
RBC: 3.15 MIL/uL — AB (ref 4.40–5.90)
RDW: 16.7 % — ABNORMAL HIGH (ref 11.5–14.5)
WBC: 9.8 10*3/uL (ref 3.8–10.6)

## 2017-12-30 LAB — GLUCOSE, CAPILLARY
GLUCOSE-CAPILLARY: 120 mg/dL — AB (ref 65–99)
GLUCOSE-CAPILLARY: 97 mg/dL (ref 65–99)

## 2017-12-30 LAB — BASIC METABOLIC PANEL
Anion gap: 10 (ref 5–15)
BUN: 18 mg/dL (ref 6–20)
CALCIUM: 9.1 mg/dL (ref 8.9–10.3)
CO2: 22 mmol/L (ref 22–32)
Chloride: 101 mmol/L (ref 101–111)
Creatinine, Ser: 1.35 mg/dL — ABNORMAL HIGH (ref 0.61–1.24)
GFR, EST AFRICAN AMERICAN: 54 mL/min — AB (ref >60)
GFR, EST NON AFRICAN AMERICAN: 46 mL/min — AB (ref >60)
Glucose, Bld: 103 mg/dL — ABNORMAL HIGH (ref 65–99)
Potassium: 4.2 mmol/L (ref 3.5–5.1)
SODIUM: 133 mmol/L — AB (ref 135–145)

## 2017-12-30 LAB — TROPONIN I: TROPONIN I: 0.45 ng/mL — AB (ref <0.03)

## 2017-12-30 MED ORDER — BISACODYL 5 MG PO TBEC: 5.0000 mg | DELAYED_RELEASE_TABLET | Freq: Every day | ORAL | Status: DC | PRN

## 2017-12-30 MED ORDER — ASPIRIN EC 81 MG PO TBEC
81.0000 mg | DELAYED_RELEASE_TABLET | Freq: Every day | ORAL | Status: DC
  Administered 2017-12-30 – 2017-12-31 (×2): 81 mg via ORAL
  Filled 2017-12-30 (×2): qty 1

## 2017-12-30 MED ORDER — ACYCLOVIR 200 MG PO CAPS
200.0000 mg | ORAL_CAPSULE | Freq: Two times a day (BID) | ORAL | Status: DC
  Administered 2017-12-30 – 2017-12-31 (×4): 200 mg via ORAL
  Filled 2017-12-30 (×5): qty 1

## 2017-12-30 MED ORDER — CLOPIDOGREL BISULFATE 75 MG PO TABS
75.0000 mg | ORAL_TABLET | Freq: Every day | ORAL | Status: DC
  Administered 2017-12-30 – 2017-12-31 (×2): 75 mg via ORAL
  Filled 2017-12-30 (×2): qty 1

## 2017-12-30 MED ORDER — TRAZODONE HCL 50 MG PO TABS: 25.0000 mg | ORAL_TABLET | Freq: Every evening | ORAL | Status: DC | PRN

## 2017-12-30 MED ORDER — HEPARIN SODIUM (PORCINE) 5000 UNIT/ML IJ SOLN
5000.0000 [IU] | Freq: Three times a day (TID) | INTRAMUSCULAR | Status: DC
  Administered 2017-12-30 – 2017-12-31 (×3): 5000 [IU] via SUBCUTANEOUS
  Filled 2017-12-30 (×3): qty 1

## 2017-12-30 MED ORDER — PRAVASTATIN SODIUM 20 MG PO TABS
10.0000 mg | ORAL_TABLET | Freq: Every day | ORAL | Status: DC
  Administered 2017-12-30: 10 mg via ORAL
  Filled 2017-12-30: qty 1

## 2017-12-30 MED ORDER — ACETAMINOPHEN 650 MG RE SUPP: 650.0000 mg | Freq: Four times a day (QID) | RECTAL | Status: DC | PRN

## 2017-12-30 MED ORDER — METFORMIN HCL 500 MG PO TABS: 500.0000 mg | ORAL_TABLET | Freq: Two times a day (BID) | ORAL | Status: DC

## 2017-12-30 MED ORDER — FINASTERIDE 5 MG PO TABS
2.5000 mg | ORAL_TABLET | Freq: Every day | ORAL | Status: DC
  Administered 2017-12-30 – 2017-12-31 (×2): 2.5 mg via ORAL
  Filled 2017-12-30: qty 1
  Filled 2017-12-30: qty 0.5
  Filled 2017-12-30: qty 1
  Filled 2017-12-30: qty 0.5

## 2017-12-30 MED ORDER — ACETAMINOPHEN 325 MG PO TABS
650.0000 mg | ORAL_TABLET | Freq: Four times a day (QID) | ORAL | Status: DC | PRN
Start: 2017-12-30 — End: 2017-12-31

## 2017-12-30 MED ORDER — HYDROCODONE-ACETAMINOPHEN 5-325 MG PO TABS: 1.0000 | ORAL_TABLET | ORAL | Status: DC | PRN

## 2017-12-30 MED ORDER — POTASSIUM CHLORIDE CRYS ER 20 MEQ PO TBCR
20.0000 meq | EXTENDED_RELEASE_TABLET | Freq: Every day | ORAL | Status: DC
  Administered 2017-12-30 – 2017-12-31 (×2): 20 meq via ORAL
  Filled 2017-12-30 (×2): qty 1

## 2017-12-30 MED ORDER — LISINOPRIL 5 MG PO TABS
2.5000 mg | ORAL_TABLET | Freq: Every day | ORAL | Status: DC
  Administered 2017-12-30 – 2017-12-31 (×2): 2.5 mg via ORAL
  Filled 2017-12-30 (×2): qty 1

## 2017-12-30 MED ORDER — ACYCLOVIR 200 MG PO CAPS
200.0000 mg | ORAL_CAPSULE | Freq: Two times a day (BID) | ORAL | Status: DC
  Filled 2017-12-30: qty 1

## 2017-12-30 MED ORDER — PANTOPRAZOLE SODIUM 40 MG PO PACK
20.0000 mg | PACK | Freq: Every day | ORAL | Status: DC
  Administered 2017-12-30: 20 mg via ORAL
  Filled 2017-12-30 (×2): qty 20

## 2017-12-30 MED ORDER — ADULT MULTIVITAMIN W/MINERALS CH
1.0000 | ORAL_TABLET | Freq: Every day | ORAL | Status: DC
  Administered 2017-12-30 – 2017-12-31 (×2): 1 via ORAL
  Filled 2017-12-30 (×2): qty 1

## 2017-12-30 MED ORDER — ONDANSETRON HCL 4 MG/2ML IJ SOLN: 4.0000 mg | Freq: Four times a day (QID) | INTRAMUSCULAR | Status: DC | PRN

## 2017-12-30 MED ORDER — DOCUSATE SODIUM 100 MG PO CAPS
100.0000 mg | ORAL_CAPSULE | Freq: Two times a day (BID) | ORAL | Status: DC
  Administered 2017-12-30 – 2017-12-31 (×3): 100 mg via ORAL
  Filled 2017-12-30 (×3): qty 1

## 2017-12-30 MED ORDER — ONDANSETRON HCL 4 MG PO TABS: 4.0000 mg | ORAL_TABLET | Freq: Four times a day (QID) | ORAL | Status: DC | PRN

## 2017-12-30 MED ORDER — FUROSEMIDE 10 MG/ML IJ SOLN
40.0000 mg | Freq: Two times a day (BID) | INTRAMUSCULAR | Status: DC
  Administered 2017-12-30 – 2017-12-31 (×3): 40 mg via INTRAVENOUS
  Filled 2017-12-30 (×4): qty 4

## 2017-12-30 MED ORDER — CARVEDILOL 3.125 MG PO TABS
3.1250 mg | ORAL_TABLET | Freq: Two times a day (BID) | ORAL | Status: DC
  Administered 2017-12-30 – 2017-12-31 (×3): 3.125 mg via ORAL
  Filled 2017-12-30 (×3): qty 1

## 2017-12-30 NOTE — Progress Notes (Signed)
Dwayne Terry at [facility]                                                                                                                                                                                  Patient Demographics   Dwayne Terry, is a 84 y.o. male, DOB - May 09, 1930, Dwayne Terry:[id number]  Admit date - 12/29/2017   Admitting Physician Amelia Jo, MD  Outpatient Primary MD for the patient is Kirk Ruths, MD   LOS - 0  Subjective: Patient admitted with chest pain and shortness of breath states that breathing much improved shortness chest pain resolved    Review of Systems:   CONSTITUTIONAL: No documented fever. No fatigue, weakness. No weight gain, no weight loss.  EYES: No blurry or double vision.  ENT: No tinnitus. No postnasal drip. No redness of the oropharynx.  RESPIRATORY: No cough, no wheeze, no hemoptysis.  Positive dyspnea.  CARDIOVASCULAR: Positive chest pain. No orthopnea. No palpitations. No syncope.  GASTROINTESTINAL: No nausea, no vomiting or diarrhea. No abdominal pain. No melena or hematochezia.  GENITOURINARY: No dysuria or hematuria.  ENDOCRINE: No polyuria or nocturia. No heat or cold intolerance.  HEMATOLOGY: No anemia. No bruising. No bleeding.  INTEGUMENTARY: No rashes. No lesions.  MUSCULOSKELETAL: No arthritis. No swelling. No gout.  NEUROLOGIC: No numbness, tingling, or ataxia. No seizure-type activity.  PSYCHIATRIC: No anxiety. No insomnia. No ADD.    Vitals:   Vitals:   12/30/17 0000 12/30/17 0223 12/30/17 0741 12/30/17 1729  BP: 106/62 (!) 108/53 (!) 101/46 (!) 103/52  Pulse: 80 69 74 71  Resp: 14 18 14 18   Temp:  (!) 97.5 F (36.4 C) 98 F (36.7 C) 97.7 F (36.5 C)  TempSrc:  Oral  Oral  SpO2: 96% 100% 97% 97%  Weight:      Height:        Wt Readings from Last 3 Encounters:  12/29/17 158 lb (71.7 kg)  12/05/17 163 lb 9.6 oz (74.2 kg)  10/31/17 160 lb (72.6 kg)     Intake/Output Summary (Last 24  hours) at 12/30/2017 1746 Last data filed at 12/30/2017 0944 Gross per 24 hour  Intake 440 ml  Output 250 ml  Net 190 ml    Physical Exam:   GENERAL: Pleasant-appearing in no apparent distress.  HEAD, EYES, EARS, NOSE AND THROAT: Atraumatic, normocephalic. Extraocular muscles are intact. Pupils equal and reactive to light. Sclerae anicteric. No conjunctival injection. No oro-pharyngeal erythema.  NECK: Supple. There is no jugular venous distention. No bruits, no lymphadenopathy, no thyromegaly.  HEART: Regular rate and rhythm,. No murmurs, no rubs, no clicks.  LUNGS: Clear to auscultation bilaterally. No rales  or rhonchi. No wheezes.  ABDOMEN: Soft, flat, nontender, nondistended. Has good bowel sounds. No hepatosplenomegaly appreciated.  EXTREMITIES: No evidence of any cyanosis, clubbing, or peripheral edema.  +2 pedal and radial pulses bilaterally.  NEUROLOGIC: The patient is alert, awake, and oriented x3 with no focal motor or sensory deficits appreciated bilaterally.  SKIN: Moist and warm with no rashes appreciated.  Psych: Not anxious, depressed LN: No inguinal LN enlargement    Antibiotics   Anti-infectives (From admission, onward)   Start     Dose/Rate Route Frequency Ordered Stop   12/30/17 1000  acyclovir (ZOVIRAX) 200 MG capsule 200 mg  Status:  Discontinued     200 mg Oral 2 times daily 12/30/17 0220 12/30/17 0308   12/30/17 0315  acyclovir (ZOVIRAX) 200 MG capsule 200 mg     200 mg Oral 2 times daily 12/30/17 0308        Medications   Scheduled Meds: . acyclovir  200 mg Oral BID  . aspirin EC  81 mg Oral Daily  . carvedilol  3.125 mg Oral BID WC  . clopidogrel  75 mg Oral Q breakfast  . docusate sodium  100 mg Oral BID  . finasteride  2.5 mg Oral Daily  . furosemide  40 mg Intravenous BID  . heparin  5,000 Units Subcutaneous Q8H  . lisinopril  2.5 mg Oral Daily  . metFORMIN  500 mg Oral BID WC  . multivitamin with minerals  1 tablet Oral Daily  . pantoprazole  sodium  20 mg Oral Daily  . potassium chloride SA  20 mEq Oral Daily  . pravastatin  10 mg Oral q1800   Continuous Infusions: PRN Meds:.acetaminophen **OR** acetaminophen, bisacodyl, HYDROcodone-acetaminophen, ondansetron **OR** ondansetron (ZOFRAN) IV, traZODone   Data Review:   Micro Results No results found for this or any previous visit (from the past 240 hour(s)).  Radiology Reports Ct Abdomen Pelvis Wo Contrast  Result Date: 12/29/2017 CLINICAL DATA:  84 y/o M; chest pain with history of bone, prostate, and blood cancer on chemotherapy. EXAM: CT CHEST, ABDOMEN AND PELVIS WITHOUT CONTRAST TECHNIQUE: Multidetector CT imaging of the chest, abdomen and pelvis was performed following the standard protocol without IV contrast. COMPARISON:  None. FINDINGS: CT CHEST FINDINGS Cardiovascular: Normal heart size. No pericardial effusion. Status post CABG with LIMA and saphenous grafts. Severe coronary artery calcification. Aortic valvular calcification associated with aortic stenosis. Normal caliber thoracic aorta and main pulmonary artery. Calcific atherosclerosis of aorta. Mediastinum/Nodes: No enlarged mediastinal, hilar, or axillary lymph nodes. Thyroid gland, trachea, and esophagus demonstrate no significant findings. Lungs/Pleura: Smooth interlobular septal thickening and mild peribronchial thickening compatible with interstitial pulmonary edema. No alveolar pulmonary edema. No pleural effusion. No pneumothorax. Coarse reticulation of the lung peripheries greatest at lung bases, probably a component of mild pulmonary fibrosis. Musculoskeletal: Healed median sternotomy. Multiple chronic rib fractures bilaterally. No acute fracture or destructive bony lesion identified. CT ABDOMEN PELVIS FINDINGS Hepatobiliary: Liver segment 2 subcentimeter cyst. Otherwise no focal liver lesion identified. Cholelithiasis. No biliary ductal dilatation. Pancreas: Unremarkable. No pancreatic ductal dilatation or  surrounding inflammatory changes. Spleen: Normal in size without focal abnormality. Adrenals/Urinary Tract: Adrenal glands are unremarkable. Kidneys are normal, without renal calculi, focal lesion, or hydronephrosis. Bladder is unremarkable. Stomach/Bowel: Stomach is within normal limits. Appendix appears normal. No evidence of bowel wall thickening, distention, or inflammatory changes. Mild sigmoid diverticulosis. Vascular/Lymphatic: Aortic atherosclerosis. No enlarged abdominal or pelvic lymph nodes. Reproductive: Mild prostate enlargement. Other: No abdominal wall hernia or abnormality. No  abdominopelvic ascites. Musculoskeletal: No acute fracture or destructive bony lesion identified. Mild multilevel discogenic degenerative changes of the thoracolumbar spine and moderate lower lumbar facet arthrosis. No high-grade bony canal stenosis. IMPRESSION: 1. Interstitial pulmonary edema. 2. Probable superimposed mild pulmonary fibrosis. 3. Severe coronary artery calcification.  Status post CABG. 4. Aortic valvular calcification associated with aortic stenosis. 5. Aortic atherosclerosis with extensive infrarenal calcification. 6. Cholelithiasis. 7. Sigmoid diverticulosis. Electronically Signed   By: Kristine Garbe M.D.   On: 12/29/2017 22:49   Dg Chest 2 View  Result Date: 12/29/2017 CLINICAL DATA:  Chest pain. History of MI, status post open heart surgery in 1989. EXAM: CHEST  2 VIEW COMPARISON:  Chest x-rays dated 10/28/2017 and 01/27/2017. FINDINGS: Heart size upper normal, stable. Overall cardiomediastinal silhouette is stable in size and configuration. Median sternotomy wires appear intact and stable in alignment. Coarse lung markings noted bilaterally, increased compared to previous study, most suggestive of interstitial edema superimposed on chronic interstitial lung disease/fibrosis. Lucency is seen along the medial aspects of the left hemidiaphragm, suspicious for free intraperitoneal air.  IMPRESSION: 1. Lucency along the medial aspect of the left hemidiaphragm, of uncertain etiology, possibly free intraperitoneal air within the upper abdomen. Recommend CT abdomen for further characterization. 2. Increasingly coarse lung markings bilaterally, most likely interstitial edema superimposed on worsening chronic interstitial lung disease/fibrosis. These results were called by telephone at the time of interpretation on 12/29/2017 at 9:53 pm to Dr. Rudene Re , who verbally acknowledged these results. Electronically Signed   By: Franki Cabot M.D.   On: 12/29/2017 21:54   Ct Chest Wo Contrast  Result Date: 12/29/2017 CLINICAL DATA:  84 y/o M; chest pain with history of bone, prostate, and blood cancer on chemotherapy. EXAM: CT CHEST, ABDOMEN AND PELVIS WITHOUT CONTRAST TECHNIQUE: Multidetector CT imaging of the chest, abdomen and pelvis was performed following the standard protocol without IV contrast. COMPARISON:  None. FINDINGS: CT CHEST FINDINGS Cardiovascular: Normal heart size. No pericardial effusion. Status post CABG with LIMA and saphenous grafts. Severe coronary artery calcification. Aortic valvular calcification associated with aortic stenosis. Normal caliber thoracic aorta and main pulmonary artery. Calcific atherosclerosis of aorta. Mediastinum/Nodes: No enlarged mediastinal, hilar, or axillary lymph nodes. Thyroid gland, trachea, and esophagus demonstrate no significant findings. Lungs/Pleura: Smooth interlobular septal thickening and mild peribronchial thickening compatible with interstitial pulmonary edema. No alveolar pulmonary edema. No pleural effusion. No pneumothorax. Coarse reticulation of the lung peripheries greatest at lung bases, probably a component of mild pulmonary fibrosis. Musculoskeletal: Healed median sternotomy. Multiple chronic rib fractures bilaterally. No acute fracture or destructive bony lesion identified. CT ABDOMEN PELVIS FINDINGS Hepatobiliary: Liver segment  2 subcentimeter cyst. Otherwise no focal liver lesion identified. Cholelithiasis. No biliary ductal dilatation. Pancreas: Unremarkable. No pancreatic ductal dilatation or surrounding inflammatory changes. Spleen: Normal in size without focal abnormality. Adrenals/Urinary Tract: Adrenal glands are unremarkable. Kidneys are normal, without renal calculi, focal lesion, or hydronephrosis. Bladder is unremarkable. Stomach/Bowel: Stomach is within normal limits. Appendix appears normal. No evidence of bowel wall thickening, distention, or inflammatory changes. Mild sigmoid diverticulosis. Vascular/Lymphatic: Aortic atherosclerosis. No enlarged abdominal or pelvic lymph nodes. Reproductive: Mild prostate enlargement. Other: No abdominal wall hernia or abnormality. No abdominopelvic ascites. Musculoskeletal: No acute fracture or destructive bony lesion identified. Mild multilevel discogenic degenerative changes of the thoracolumbar spine and moderate lower lumbar facet arthrosis. No high-grade bony canal stenosis. IMPRESSION: 1. Interstitial pulmonary edema. 2. Probable superimposed mild pulmonary fibrosis. 3. Severe coronary artery calcification.  Status post CABG. 4. Aortic  valvular calcification associated with aortic stenosis. 5. Aortic atherosclerosis with extensive infrarenal calcification. 6. Cholelithiasis. 7. Sigmoid diverticulosis. Electronically Signed   By: Kristine Garbe M.D.   On: 12/29/2017 22:49     CBC Recent Labs  Lab 12/29/17 2112 12/30/17 0714  WBC 5.1 9.8  HGB 11.3* 9.6*  HCT 34.2* 28.7*  PLT 147* 110*  MCV 91.5 91.0  MCH 30.1 30.6  MCHC 32.9 33.7  RDW 16.6* 16.7*    Chemistries  Recent Labs  Lab 12/29/17 2112 12/30/17 0714  NA 130* 133*  K 4.5 4.2  CL 100* 101  CO2 18* 22  GLUCOSE 393* 103*  BUN 18 18  CREATININE 1.54* 1.35*  CALCIUM 9.1 9.1    ------------------------------------------------------------------------------------------------------------------ estimated creatinine clearance is 38.7 mL/min (A) (by C-G formula based on SCr of 1.35 mg/dL (H)). ------------------------------------------------------------------------------------------------------------------ No results for input(s): HGBA1C in the last 72 hours. ------------------------------------------------------------------------------------------------------------------ No results for input(s): CHOL, HDL, LDLCALC, TRIG, CHOLHDL, LDLDIRECT in the last 72 hours. ------------------------------------------------------------------------------------------------------------------ No results for input(s): TSH, T4TOTAL, T3FREE, THYROIDAB in the last 72 hours.  Invalid input(s): FREET3 ------------------------------------------------------------------------------------------------------------------ No results for input(s): VITAMINB12, FOLATE, FERRITIN, TIBC, IRON, RETICCTPCT in the last 72 hours.  Coagulation profile Recent Labs  Lab 12/29/17 2112  INR 1.02    No results for input(s): DDIMER in the last 72 hours.  Cardiac Enzymes Recent Labs  Lab 12/29/17 2112 12/30/17 0714  TROPONINI 0.04* 0.45*   ------------------------------------------------------------------------------------------------------------------ Invalid input(s): POCBNP    Assessment & Plan   1.   Acute on chronic systolic CHF: Continue therapy with IV Lasix, Coreg and lisinopril 2.    Chest pain with elevated troponin patient with known coronary artery disease has had multiple caths I will ask his cardiologist to see. 3.  Acute renal failure, creatinine is elevated at 1.54, likely related to pulmonary edema.   Recheck renal function in the morning .  4.  Uncontrolled diabetes type 2.    Continue sliding scale insulin continue Metformin 5.  Bone marrow cancer, on chemotherapy, stable.  Continue  to follow with oncology as outpatient. 6.  Hyperlipidemia continue Pravachol      Code Status Orders  (From admission, onward)        Start     Ordered   12/30/17 0221  Full code  Continuous     12/30/17 0220    Code Status History    Date Active Date Inactive Code Status Order ID Comments User Context   10/29/2017 02:28 10/31/2017 19:00 Full Code [id number]  Lance Coon, MD ED   09/17/2017 00:58 09/20/2017 15:24 Full Code [id number]  Lance Coon, MD Inpatient   06/10/2017 03:24 06/11/2017 16:13 Full Code [id number]  Harrie Foreman, MD Inpatient   04/29/2017 05:12 04/30/2017 14:37 Full Code [id number]  Harrie Foreman, MD Inpatient   01/27/2017 23:56 01/29/2017 14:30 Full Code [id number]  Harvie Bridge, DO Inpatient    Advance Directive Documentation     Most Recent Value  Type of Advance Directive  Living will  Pre-existing out of facility DNR order (yellow form or pink MOST form)  No data  "MOST" Form in Place?  No data           Consults cardiology   DVT Prophylaxis SCDs  Lab Results  Component Value Date   PLT 110 (L) 12/30/2017     Time Spent in minutes45 minutes  Greater than 50% of time spent in care coordination and counseling patient regarding the condition and plan of care.  Dustin Flock M.D on 12/30/2017 at 5:46 PM  Between 7am to 6pm - Pager - [phone]  After 6pm go to [email or web address] - password [email or web address] [facility]   Office  [phone]

## 2017-12-30 NOTE — ED Notes (Signed)
Call to [facility] hospitalist for bed assignment order

## 2017-12-30 NOTE — Plan of Care (Signed)
  Progressing Education: Knowledge of General Education information will improve 12/30/2017 0344 - Progressing by Loran Senters, RN Pain Managment: General experience of comfort will improve 12/30/2017 0344 - Progressing by Loran Senters, RN Safety: Ability to remain free from injury will improve 12/30/2017 0344 - Progressing by Loran Senters, RN Education: Ability to verbalize understanding of medication therapies will improve 12/30/2017 0344 - Progressing by Loran Senters, RN Activity: Ability to tolerate increased activity will improve 12/30/2017 0344 - Progressing by Loran Senters, RN

## 2017-12-30 NOTE — Consult Note (Signed)
[facility] Cardiology  CARDIOLOGY CONSULT NOTE  Patient ID: Dwayne Terry MRN: [id number] DOB/AGE: Mar 19, 1930 84 y.o.  Admit date: 12/29/2017 Referring Physician Posey Pronto Primary Physician [facility] Primary Cardiologist Paras[place] Reason for Consultation acute on chronic systolic CHF  HPI: 84 year old male referred for evaluation of acute on chronic systolic congestive heart failure.  The patient has a complex coronary history, status post CABG and multiple stents, most recently with bare-metal stent proximal RCA in 01/2017, status post multiple non-STEMIs secondary to in-stent restenosis, as well as essential hypertension, hyperlipidemia, type 2 diabetes, sleep apnea, multiple myeloma, currently undergoing chemotherapy, paroxysmal atrial fibrillation, currently only on aspirin and Plavix secondary to recent stent, and chronic systolic congestive heart failure.  The patient reports experiencing pressure-like chest discomfort earlier this week after eating a bowl of fast food chili with extra salt.  The chest discomfort resolved soon after taking antacids.  The next day after eating dumplings with extra salt the patient again experienced chest pressure that felt like an elephant sitting on his chest that did not resolve after antacids, and progressively worsened.  The pain was nonexertional, and was not associated with shortness of breath, radiation, nausea, or diaphoresis.  The patient denies a recent history of peripheral edema, orthopnea, or increased shortness of breath.  The patient was brought to [facility] ER last night for further evaluation.  The chest pain resolved by the time he arrived to the ER.  ECG revealed sinus tachycardia at a rate of 129 bpm with known left bundle branch block, with T wave inversions in V5 and V6, and ST depression in leads I, V5, and V6 without ST elevation.  The patient reports that he had not taken his beta-blocker as directed that day. Chest x-ray revealed interstitial pulmonary  edema.  Admission labs notable for troponin 0.04, followed by 0.45, creatinine 1.35, BUN 18, sodium 133.  The patient received IV Lasix with appropriate diuresis.  He reports feeling much better.  He denies recurrence of chest pain.  He denies shortness of breath.  He does have low back pain.  He denies palpitations or heart racing.  Of note, the patient underwent cardiac catheterization on 10/31/2017, which revealed patent LIMA to LAD, SVG to OM1, and 70-75% stenosis distal RCA, not amenable to PCI.  2D echocardiogram on 09/18/2017 revealed moderate-severely reduced left ventricular function with LVEF 30-35% with diffuse hypokinesis, with mild mitral regurgitation.  Review of systems complete and found to be negative unless listed above     Past Medical History:  Diagnosis Date  . Cancer ([place])    bone marrow, prostate cancer  . Diabetes mellitus without complication ([place])   . Hyperlipidemia   . MI (mitral incompetence)   . MI (myocardial infarction) [facility])     Past Surgical History:  Procedure Laterality Date  . AORTIC VALVE REPLACEMENT (AVR)/CORONARY ARTERY BYPASS GRAFTING (CABG)    . CORONARY ANGIOPLASTY WITH STENT PLACEMENT    . CORONARY BALLOON ANGIOPLASTY N/A 06/10/2017   Procedure: Coronary Balloon Angioplasty;  Surgeon: Wellington Hampshire, MD;  Location: [place] [facility];  Service: Cardiovascular;  Laterality: N/A;  . CORONARY STENT INTERVENTION N/A 01/28/2017   Procedure: Coronary Stent Intervention;  Surgeon: [name], MD;  Location: [place] [facility];  Service: Cardiovascular;  Laterality: N/A;  . CORONARY STENT INTERVENTION N/A 04/29/2017   Procedure: Coronary Stent Intervention;  Surgeon: [name], MD;  Location: [place] [facility];  Service: Cardiovascular;  Laterality: N/A;  . CORONARY STENT INTERVENTION N/A 09/19/2017  Procedure: CORONARY/GRAFT ANGIOGRAPHY;  Surgeon: Corey Skains, MD;  Location: [place] [facility];  Service: Cardiovascular;   Laterality: N/A;  . CORONARY STENT INTERVENTION N/A 09/19/2017   Procedure: CORONARY STENT INTERVENTION;  Surgeon: Wellington Hampshire, MD;  Location: [place] [facility];  Service: Cardiovascular;  Laterality: N/A;  . LEFT HEART CATH AND CORONARY ANGIOGRAPHY N/A 01/28/2017   Procedure: Left Heart Cath and Coronary Angiography;  Surgeon: [name], MD;  Location: [place] [facility];  Service: Cardiovascular;  Laterality: N/A;  . LEFT HEART CATH AND CORONARY ANGIOGRAPHY N/A 10/31/2017   Procedure: LEFT HEART CATH AND CORONARY ANGIOGRAPHY;  Surgeon: Teodoro Spray, MD;  Location: [place] [facility];  Service: Cardiovascular;  Laterality: N/A;  . LEFT HEART CATH AND CORS/GRAFTS ANGIOGRAPHY N/A 04/29/2017   Procedure: Left Heart Cath and Cors/Grafts Angiography;  Surgeon: [name], MD;  Location: [place] [facility];  Service: Cardiovascular;  Laterality: N/A;  . LEFT HEART CATH AND CORS/GRAFTS ANGIOGRAPHY N/A 06/10/2017   Procedure: Left Heart Cath and Cors/Grafts Angiography;  Surgeon: Corey Skains, MD;  Location: [place] [facility];  Service: Cardiovascular;  Laterality: N/A;  . LEFT HEART CATH AND CORS/GRAFTS ANGIOGRAPHY N/A 09/19/2017   Procedure: LEFT HEART CATH;  Surgeon: Corey Skains, MD;  Location: [place] [facility];  Service: Cardiovascular;  Laterality: N/A;    Medications Prior to Admission  Medication Sig Dispense Refill Last Dose  . acetaminophen (TYLENOL) 325 MG tablet Take 650 mg by mouth every 6 (six) hours as needed.   prn at prn  . acyclovir (ZOVIRAX) 200 MG capsule Take 200 mg by mouth 2 (two) times daily.   12/29/2017 at 2000  . aspirin EC 81 MG tablet Take 1 tablet (81 mg total) by mouth daily.   12/29/2017 at Unknown time  . carvedilol (COREG) 6.25 MG tablet Take 1 tablet (6.25 mg total) by mouth 2 (two) times daily with a meal. (Patient taking differently: Take 3.25 mg by mouth 2 (two) times daily with a meal. ) 60 tablet 0 12/29/2017 at 2000   . clopidogrel (PLAVIX) 75 MG tablet Take 1 tablet (75 mg total) by mouth daily with breakfast. 30 tablet 1 12/29/2017 at 2000  . dexamethasone (DECADRON) 2 MG tablet Take 8.5 mg by mouth See admin instructions. Take 8.5 mg on Thursday and Friday.   12/29/2017 at Unknown time  . finasteride (PROSCAR) 5 MG tablet Take 2.5 mg by mouth daily.    12/29/2017 at 2000  . furosemide (LASIX) 20 MG tablet Take 1 tablet (20 mg total) by mouth daily. 30 tablet 11 12/29/2017 at 2000  . lansoprazole (PREVACID) 30 MG capsule Take 30 mg by mouth 2 (two) times daily as needed.    prn at prn  . lisinopril (ZESTRIL) 2.5 MG tablet Take 1 tablet (2.5 mg total) by mouth daily. 30 tablet 11 12/29/2017 at 2000  . lovastatin (MEVACOR) 20 MG tablet Take 20 mg by mouth daily at 6 PM.   12/29/2017 at 2000  . metFORMIN (GLUCOPHAGE) 500 MG tablet Take 500 mg by mouth 2 (two) times daily with a meal.   12/29/2017 at 2000  . Multiple Vitamin (MULTI-VITAMINS) TABS Take 1 tablet by mouth daily.   12/29/2017 at 2000  . potassium chloride SA (K-DUR,KLOR-CON) 20 MEQ tablet Take 1 tablet (20 mEq total) by mouth daily. 30 tablet 0 12/29/2017 at 2000  . silver sulfADIAZINE (SILVADENE) 1 % cream Apply to affected area daily 50 g 1 prn  at prn   Social History   Socioeconomic History  . Marital status: Married    Spouse name: Not on file  . Number of children: Not on file  . Years of education: Not on file  . Highest education level: Not on file  Social Needs  . Financial resource strain: Not on file  . Food insecurity - worry: Not on file  . Food insecurity - inability: Not on file  . Transportation needs - medical: Not on file  . Transportation needs - non-medical: Not on file  Occupational History  . Not on file  Tobacco Use  . Smoking status: Former Smoker    Last attempt to quit: 2015    Years since quitting: 4.0  . Smokeless tobacco: Former Network engineer and Sexual Activity  . Alcohol use: No  . Drug use: No  . Sexual  activity: Not on file  Other Topics Concern  . Not on file  Social History Narrative  . Not on file    Family History  Problem Relation Age of Onset  . Heart disease Other       Review of systems complete and found to be negative unless listed above      PHYSICAL EXAM  General: Well developed, well nourished, in no acute distress, sitting upright in bed, pleasant HEENT:  Normocephalic and atramatic Neck:  No JVD.  Lungs: Normal effort of breathing on room air, no wheezing, bibasilar rhonchi Heart: HRRR . Normal S1 and S2 without gallops or murmurs.  Abdomen: Bowel sounds are positive, abdomen soft  Msk:  Back normal, gait not assessed. Normal strength and tone for age. Extremities: No clubbing, cyanosis or edema.   Neuro: Alert and oriented X 3. Psych:  Good affect, responds appropriately  Labs:   Lab Results  Component Value Date   WBC 9.8 12/30/2017   HGB 9.6 (L) 12/30/2017   HCT 28.7 (L) 12/30/2017   MCV 91.0 12/30/2017   PLT 110 (L) 12/30/2017    Recent Labs  Lab 12/30/17 0714  NA 133*  K 4.2  CL 101  CO2 22  BUN 18  CREATININE 1.35*  CALCIUM 9.1  GLUCOSE 103*   Lab Results  Component Value Date   CKTOTAL 600 (H) 07/08/2013   CKMB 44.7 (H) 07/08/2013   TROPONINI 0.45 (HH) 12/30/2017    Lab Results  Component Value Date   CHOL 110 07/08/2013   Lab Results  Component Value Date   HDL 30 (L) 07/08/2013   Lab Results  Component Value Date   LDLCALC 60 07/08/2013   Lab Results  Component Value Date   TRIG 101 07/08/2013   No results found for: CHOLHDL No results found for: LDLDIRECT    Radiology: Ct Abdomen Pelvis Wo Contrast  Result Date: 12/29/2017 CLINICAL DATA:  84 y/o M; chest pain with history of bone, prostate, and blood cancer on chemotherapy. EXAM: CT CHEST, ABDOMEN AND PELVIS WITHOUT CONTRAST TECHNIQUE: Multidetector CT imaging of the chest, abdomen and pelvis was performed following the standard protocol without IV contrast.  COMPARISON:  None. FINDINGS: CT CHEST FINDINGS Cardiovascular: Normal heart size. No pericardial effusion. Status post CABG with LIMA and saphenous grafts. Severe coronary artery calcification. Aortic valvular calcification associated with aortic stenosis. Normal caliber thoracic aorta and main pulmonary artery. Calcific atherosclerosis of aorta. Mediastinum/Nodes: No enlarged mediastinal, hilar, or axillary lymph nodes. Thyroid gland, trachea, and esophagus demonstrate no significant findings. Lungs/Pleura: Smooth interlobular septal thickening and mild peribronchial thickening compatible with  interstitial pulmonary edema. No alveolar pulmonary edema. No pleural effusion. No pneumothorax. Coarse reticulation of the lung peripheries greatest at lung bases, probably a component of mild pulmonary fibrosis. Musculoskeletal: Healed median sternotomy. Multiple chronic rib fractures bilaterally. No acute fracture or destructive bony lesion identified. CT ABDOMEN PELVIS FINDINGS Hepatobiliary: Liver segment 2 subcentimeter cyst. Otherwise no focal liver lesion identified. Cholelithiasis. No biliary ductal dilatation. Pancreas: Unremarkable. No pancreatic ductal dilatation or surrounding inflammatory changes. Spleen: Normal in size without focal abnormality. Adrenals/Urinary Tract: Adrenal glands are unremarkable. Kidneys are normal, without renal calculi, focal lesion, or hydronephrosis. Bladder is unremarkable. Stomach/Bowel: Stomach is within normal limits. Appendix appears normal. No evidence of bowel wall thickening, distention, or inflammatory changes. Mild sigmoid diverticulosis. Vascular/Lymphatic: Aortic atherosclerosis. No enlarged abdominal or pelvic lymph nodes. Reproductive: Mild prostate enlargement. Other: No abdominal wall hernia or abnormality. No abdominopelvic ascites. Musculoskeletal: No acute fracture or destructive bony lesion identified. Mild multilevel discogenic degenerative changes of the  thoracolumbar spine and moderate lower lumbar facet arthrosis. No high-grade bony canal stenosis. IMPRESSION: 1. Interstitial pulmonary edema. 2. Probable superimposed mild pulmonary fibrosis. 3. Severe coronary artery calcification.  Status post CABG. 4. Aortic valvular calcification associated with aortic stenosis. 5. Aortic atherosclerosis with extensive infrarenal calcification. 6. Cholelithiasis. 7. Sigmoid diverticulosis. Electronically Signed   By: Kristine Garbe M.D.   On: 12/29/2017 22:49   Dg Chest 2 View  Result Date: 12/29/2017 CLINICAL DATA:  Chest pain. History of MI, status post open heart surgery in 1989. EXAM: CHEST  2 VIEW COMPARISON:  Chest x-rays dated 10/28/2017 and 01/27/2017. FINDINGS: Heart size upper normal, stable. Overall cardiomediastinal silhouette is stable in size and configuration. Median sternotomy wires appear intact and stable in alignment. Coarse lung markings noted bilaterally, increased compared to previous study, most suggestive of interstitial edema superimposed on chronic interstitial lung disease/fibrosis. Lucency is seen along the medial aspects of the left hemidiaphragm, suspicious for free intraperitoneal air. IMPRESSION: 1. Lucency along the medial aspect of the left hemidiaphragm, of uncertain etiology, possibly free intraperitoneal air within the upper abdomen. Recommend CT abdomen for further characterization. 2. Increasingly coarse lung markings bilaterally, most likely interstitial edema superimposed on worsening chronic interstitial lung disease/fibrosis. These results were called by telephone at the time of interpretation on 12/29/2017 at 9:53 pm to Dr. Rudene Re , who verbally acknowledged these results. Electronically Signed   By: Franki Cabot M.D.   On: 12/29/2017 21:54   Ct Chest Wo Contrast  Result Date: 12/29/2017 CLINICAL DATA:  83 y/o M; chest pain with history of bone, prostate, and blood cancer on chemotherapy. EXAM: CT CHEST,  ABDOMEN AND PELVIS WITHOUT CONTRAST TECHNIQUE: Multidetector CT imaging of the chest, abdomen and pelvis was performed following the standard protocol without IV contrast. COMPARISON:  None. FINDINGS: CT CHEST FINDINGS Cardiovascular: Normal heart size. No pericardial effusion. Status post CABG with LIMA and saphenous grafts. Severe coronary artery calcification. Aortic valvular calcification associated with aortic stenosis. Normal caliber thoracic aorta and main pulmonary artery. Calcific atherosclerosis of aorta. Mediastinum/Nodes: No enlarged mediastinal, hilar, or axillary lymph nodes. Thyroid gland, trachea, and esophagus demonstrate no significant findings. Lungs/Pleura: Smooth interlobular septal thickening and mild peribronchial thickening compatible with interstitial pulmonary edema. No alveolar pulmonary edema. No pleural effusion. No pneumothorax. Coarse reticulation of the lung peripheries greatest at lung bases, probably a component of mild pulmonary fibrosis. Musculoskeletal: Healed median sternotomy. Multiple chronic rib fractures bilaterally. No acute fracture or destructive bony lesion identified. CT ABDOMEN PELVIS FINDINGS Hepatobiliary: Liver segment  2 subcentimeter cyst. Otherwise no focal liver lesion identified. Cholelithiasis. No biliary ductal dilatation. Pancreas: Unremarkable. No pancreatic ductal dilatation or surrounding inflammatory changes. Spleen: Normal in size without focal abnormality. Adrenals/Urinary Tract: Adrenal glands are unremarkable. Kidneys are normal, without renal calculi, focal lesion, or hydronephrosis. Bladder is unremarkable. Stomach/Bowel: Stomach is within normal limits. Appendix appears normal. No evidence of bowel wall thickening, distention, or inflammatory changes. Mild sigmoid diverticulosis. Vascular/Lymphatic: Aortic atherosclerosis. No enlarged abdominal or pelvic lymph nodes. Reproductive: Mild prostate enlargement. Other: No abdominal wall hernia or  abnormality. No abdominopelvic ascites. Musculoskeletal: No acute fracture or destructive bony lesion identified. Mild multilevel discogenic degenerative changes of the thoracolumbar spine and moderate lower lumbar facet arthrosis. No high-grade bony canal stenosis. IMPRESSION: 1. Interstitial pulmonary edema. 2. Probable superimposed mild pulmonary fibrosis. 3. Severe coronary artery calcification.  Status post CABG. 4. Aortic valvular calcification associated with aortic stenosis. 5. Aortic atherosclerosis with extensive infrarenal calcification. 6. Cholelithiasis. 7. Sigmoid diverticulosis. Electronically Signed   By: Kristine Garbe M.D.   On: 12/29/2017 22:49    EKG: Sinus rhythm, rate 72 bpm  ASSESSMENT AND PLAN:  1. Acute on chronic systolic CHF, secondary to dietary noncompliance, with improvement in symptoms after IV Lasix 2. Known complex coronary artery disease, status post CABG, multiple stents, most recently with bare-metal stent proximal RCA in 01/2017, status post multiple non-STEMIs secondary to in-stent restenosis, with recent chest pressure that has since resolved, with non-diagnostic ECG, with troponin 0.04, followed by 0.45. Currently on DAPT. 3. Paroxysmal atrial fibrillation, first noted while patient was en route to hospital by EMS, with recent 48-hr Holter monitor revealing one 5-beat atrial run.  4. AKI 5. Multiple myeloma, currently undergoing chemotherapy 6. Essential hypertension, blood pressure low normal 7. Hyperlipidemia, currently on lovastatin  Recommendations: 1. Agree with overall therapy 2. Continue Lasix with careful monitoring of renal status 3. Strongly encouraged dietary salt restriction. 4. Defer further cardiac diagnostics at this time 5. Continue to monitor cardiac enzymes 6. Further recommendations pending patient's initial course  Signed: Sharolyn Douglas 12/30/2017, 1:14 PM

## 2017-12-30 NOTE — H&P (Signed)
[facility] NAME: Dwayne Terry    MR#:  [id number]  DATE OF BIRTH:  09-19-1930  DATE OF ADMISSION:  12/29/2017  PRIMARY CARE PHYSICIAN: Kirk Ruths, MD   REQUESTING/REFERRING PHYSICIAN:   CHIEF COMPLAINT:  No chief complaint on file.   HISTORY OF PRESENT ILLNESS: Dwayne Terry  is a 84 y.o. male with a known history of bone marrow cancer, undergoing chemotherapy.  Patient also has history of CHF, diabetes type 2 and coronary artery disease status post MI and stentS placement in the past. Patient was brought to emergency room for acute onset of chest pain, described as retrosternal pressure, 7 out of 10 in severity without radiation.  Chest pain is worsening with exertion and is associated with shortness of breath and nausea.  Patient states this symptoms are similar to the pain he had during the heart attack.  No fever, chills or cough.  No nausea, vomiting, abdominal pain or diarrhea. Patient admits to being noncompliant with his diet and that he forgot to take his beta-blocker medication yesterday.  EKG was reviewed and noted with sinus tachycardia and old stable left bundle branch block, no acute changes.  Chest x-ray and chest CT show pulmonary edema.  Troponin level is slightly elevated at 0.04 and creatinine level is elevated at 1.5.  Blood sugar is elevated at 393. Patient is admitted for further evaluation and treatment.  PAST MEDICAL HISTORY:   Past Medical History:  Diagnosis Date  . Cancer ([place])    bone marrow, prostate cancer  . Diabetes mellitus without complication ([place])   . Hyperlipidemia   . MI (mitral incompetence)   . MI (myocardial infarction) ([place])     PAST SURGICAL HISTORY:  Past Surgical History:  Procedure Laterality Date  . AORTIC VALVE REPLACEMENT (AVR)/CORONARY ARTERY BYPASS GRAFTING (CABG)    . CORONARY ANGIOPLASTY WITH STENT PLACEMENT    . CORONARY BALLOON ANGIOPLASTY N/A 06/10/2017    Procedure: Coronary Balloon Angioplasty;  Surgeon: Wellington Hampshire, MD;  Location: [facility];  Service: Cardiovascular;  Laterality: N/A;  . CORONARY STENT INTERVENTION N/A 01/28/2017   Procedure: Coronary Stent Intervention;  Surgeon: [name], MD;  Location: [facility];  Service: Cardiovascular;  Laterality: N/A;  . CORONARY STENT INTERVENTION N/A 04/29/2017   Procedure: Coronary Stent Intervention;  Surgeon: [name], MD;  Location: [facility];  Service: Cardiovascular;  Laterality: N/A;  . CORONARY STENT INTERVENTION N/A 09/19/2017   Procedure: CORONARY/GRAFT ANGIOGRAPHY;  Surgeon: Corey Skains, MD;  Location: [facility];  Service: Cardiovascular;  Laterality: N/A;  . CORONARY STENT INTERVENTION N/A 09/19/2017   Procedure: CORONARY STENT INTERVENTION;  Surgeon: Wellington Hampshire, MD;  Location: [facility];  Service: Cardiovascular;  Laterality: N/A;  . LEFT HEART CATH AND CORONARY ANGIOGRAPHY N/A 01/28/2017   Procedure: Left Heart Cath and Coronary Angiography;  Surgeon: [name], MD;  Location: [facility];  Service: Cardiovascular;  Laterality: N/A;  . LEFT HEART CATH AND CORONARY ANGIOGRAPHY N/A 10/31/2017   Procedure: LEFT HEART CATH AND CORONARY ANGIOGRAPHY;  Surgeon: Teodoro Spray, MD;  Location: [facility];  Service: Cardiovascular;  Laterality: N/A;  . LEFT HEART CATH AND CORS/GRAFTS ANGIOGRAPHY N/A 04/29/2017   Procedure: Left Heart Cath and Cors/Grafts Angiography;  Surgeon: [name], MD;  Location: [facility];  Service: Cardiovascular;  Laterality: N/A;  . LEFT HEART CATH AND CORS/GRAFTS  ANGIOGRAPHY N/A 06/10/2017   Procedure: Left Heart Cath and Cors/Grafts Angiography;  Surgeon: Corey Skains, MD;  Location: [facility];  Service: Cardiovascular;  Laterality: N/A;  . LEFT HEART CATH AND CORS/GRAFTS ANGIOGRAPHY N/A 09/19/2017   Procedure: LEFT  HEART CATH;  Surgeon: Corey Skains, MD;  Location: [facility];  Service: Cardiovascular;  Laterality: N/A;    SOCIAL HISTORY:  Social History   Tobacco Use  . Smoking status: Former Smoker    Last attempt to quit: 2015    Years since quitting: 4.0  . Smokeless tobacco: Former Network engineer Use Topics  . Alcohol use: No    FAMILY HISTORY:  Family History  Problem Relation Age of Onset  . Heart disease Other     DRUG ALLERGIES:  Allergies  Allergen Reactions  . Antihistamines, Chlorpheniramine-Type Other (See Comments)    Reaction: unknown  . Nitroglycerin Other (See Comments)    Reaction: hypotension    REVIEW OF SYSTEMS:   CONSTITUTIONAL: No fever, fatigue or weakness.  EYES: No blurred or double vision.  EARS, NOSE, AND THROAT: No tinnitus or ear pain.  RESPIRATORY: No cough, shortness of breath, wheezing or hemoptysis.  CARDIOVASCULAR: No chest pain, orthopnea, edema.  GASTROINTESTINAL: No nausea, vomiting, diarrhea or abdominal pain.  GENITOURINARY: No dysuria, hematuria.  ENDOCRINE: No polyuria, nocturia,  HEMATOLOGY: No anemia, easy bruising or bleeding SKIN: No rash or lesion. MUSCULOSKELETAL: No joint pain or arthritis.   NEUROLOGIC: No tingling, numbness, weakness.  PSYCHIATRY: No anxiety or depression.   MEDICATIONS AT HOME:  Prior to Admission medications   Medication Sig Start Date End Date Taking? Authorizing Provider  acetaminophen (TYLENOL) 325 MG tablet Take 650 mg by mouth every 6 (six) hours as needed.   Yes [provider]  acyclovir (ZOVIRAX) 200 MG capsule Take 200 mg by mouth 2 (two) times daily.   Yes [provider]  aspirin EC 81 MG tablet Take 1 tablet (81 mg total) by mouth daily. 01/29/17  Yes Loletha Grayer, MD  carvedilol (COREG) 6.25 MG tablet Take 1 tablet (6.25 mg total) by mouth 2 (two) times daily with a meal. Patient taking differently: Take 3.25 mg by mouth 2 (two) times daily with a meal.   01/29/17  Yes Leslye Peer, Richard, MD  clopidogrel (PLAVIX) 75 MG tablet Take 1 tablet (75 mg total) by mouth daily with breakfast. 04/30/17  Yes Sainani, Belia Heman, MD  dexamethasone (DECADRON) 2 MG tablet Take 8.5 mg by mouth See admin instructions. Take 8.5 mg on Thursday and Friday.   Yes [provider]  finasteride (PROSCAR) 5 MG tablet Take 2.5 mg by mouth daily.    Yes [provider]  furosemide (LASIX) 20 MG tablet Take 1 tablet (20 mg total) by mouth daily. 09/20/17 09/20/18 Yes Dustin Flock, MD  lansoprazole (PREVACID) 30 MG capsule Take 30 mg by mouth 2 (two) times daily as needed.  11/03/17 11/03/18 Yes [provider]  lisinopril (ZESTRIL) 2.5 MG tablet Take 1 tablet (2.5 mg total) by mouth daily. 09/20/17 09/20/18 Yes Dustin Flock, MD  lovastatin (MEVACOR) 20 MG tablet Take 20 mg by mouth daily at 6 PM.   Yes [provider]  metFORMIN (GLUCOPHAGE) 500 MG tablet Take 500 mg by mouth 2 (two) times daily with a meal.   Yes [provider]  Multiple Vitamin (MULTI-VITAMINS) TABS Take 1 tablet by mouth daily.   Yes [provider]  potassium chloride SA (K-DUR,KLOR-CON) 20 MEQ  tablet Take 1 tablet (20 mEq total) by mouth daily. 09/20/17  Yes Dustin Flock, MD  silver sulfADIAZINE (SILVADENE) 1 % cream Apply to affected area daily 08/03/17 08/03/18 Yes Gaines, Marijo Conception, PA-C      PHYSICAL EXAMINATION:   VITAL SIGNS: Blood pressure (!) 108/53, pulse 69, temperature (!) 97.5 F (36.4 C), temperature source Oral, resp. rate 18, height 5\' 8"  (1.727 m), weight 71.7 kg (158 lb), SpO2 100 %.  GENERAL:  84 y.o.-year-old patient lying in the bed with no acute distress.  EYES: Pupils equal, round, reactive to light and accommodation. No scleral icterus. Extraocular muscles intact.  HEENT: Head atraumatic, normocephalic. Oropharynx and nasopharynx clear.  NECK:  Supple, no jugular venous distention. No thyroid enlargement, no tenderness.  LUNGS:  Normal breath sounds bilaterally, no wheezing, rales,rhonchi or crepitation. No use of accessory muscles of respiration.  CARDIOVASCULAR: S1, S2 normal. No murmurs, rubs, or gallops.  ABDOMEN: Soft, nontender, nondistended. Bowel sounds present. No organomegaly or mass.  EXTREMITIES: No pedal edema, cyanosis, or clubbing.  NEUROLOGIC: Cranial nerves II through XII are intact. Muscle strength 5/5 in all extremities. Sensation intact. Gait not checked.  PSYCHIATRIC: The patient is alert and oriented x 3.  SKIN: No obvious rash, lesion, or ulcer.   LABORATORY PANEL:   CBC Recent Labs  Lab 12/29/17 2112  WBC 5.1  HGB 11.3*  HCT 34.2*  PLT 147*  MCV 91.5  MCH 30.1  MCHC 32.9  RDW 16.6*   ------------------------------------------------------------------------------------------------------------------  Chemistries  Recent Labs  Lab 12/29/17 2112  NA 130*  K 4.5  CL 100*  CO2 18*  GLUCOSE 393*  BUN 18  CREATININE 1.54*  CALCIUM 9.1   ------------------------------------------------------------------------------------------------------------------ estimated creatinine clearance is 33.9 mL/min (A) (by C-G formula based on SCr of 1.54 mg/dL (H)). ------------------------------------------------------------------------------------------------------------------ No results for input(s): TSH, T4TOTAL, T3FREE, THYROIDAB in the last 72 hours.  Invalid input(s): FREET3   Coagulation profile Recent Labs  Lab 12/29/17 2112  INR 1.02   ------------------------------------------------------------------------------------------------------------------- No results for input(s): DDIMER in the last 72 hours. -------------------------------------------------------------------------------------------------------------------  Cardiac Enzymes Recent Labs  Lab 12/29/17 2112  TROPONINI 0.04*    ------------------------------------------------------------------------------------------------------------------ Invalid input(s): POCBNP  ---------------------------------------------------------------------------------------------------------------  Urinalysis    Component Value Date/Time   COLORURINE YELLOW 07/07/2013 1646   APPEARANCEUR CLEAR 07/07/2013 1646   LABSPEC 1.020 07/07/2013 1646   PHURINE 6.0 07/07/2013 1646   GLUCOSEU NEGATIVE 07/07/2013 1646   HGBUR NEGATIVE 07/07/2013 1646   BILIRUBINUR NEGATIVE 07/07/2013 1646   KETONESUR NEGATIVE 07/07/2013 1646   PROTEINUR NEGATIVE 07/07/2013 1646   NITRITE NEGATIVE 07/07/2013 1646   LEUKOCYTESUR 3+ 07/07/2013 1646     RADIOLOGY: Ct Abdomen Pelvis Wo Contrast  Result Date: 12/29/2017 CLINICAL DATA:  84 y/o M; chest pain with history of bone, prostate, and blood cancer on chemotherapy. EXAM: CT CHEST, ABDOMEN AND PELVIS WITHOUT CONTRAST TECHNIQUE: Multidetector CT imaging of the chest, abdomen and pelvis was performed following the standard protocol without IV contrast. COMPARISON:  None. FINDINGS: CT CHEST FINDINGS Cardiovascular: Normal heart size. No pericardial effusion. Status post CABG with LIMA and saphenous grafts. Severe coronary artery calcification. Aortic valvular calcification associated with aortic stenosis. Normal caliber thoracic aorta and main pulmonary artery. Calcific atherosclerosis of aorta. Mediastinum/Nodes: No enlarged mediastinal, hilar, or axillary lymph nodes. Thyroid gland, trachea, and esophagus demonstrate no significant findings. Lungs/Pleura: Smooth interlobular septal thickening and mild peribronchial thickening compatible with interstitial pulmonary edema. No alveolar pulmonary edema. No pleural effusion. No pneumothorax. Coarse reticulation of the lung peripheries  greatest at lung bases, probably a component of mild pulmonary fibrosis. Musculoskeletal: Healed median sternotomy. Multiple chronic rib  fractures bilaterally. No acute fracture or destructive bony lesion identified. CT ABDOMEN PELVIS FINDINGS Hepatobiliary: Liver segment 2 subcentimeter cyst. Otherwise no focal liver lesion identified. Cholelithiasis. No biliary ductal dilatation. Pancreas: Unremarkable. No pancreatic ductal dilatation or surrounding inflammatory changes. Spleen: Normal in size without focal abnormality. Adrenals/Urinary Tract: Adrenal glands are unremarkable. Kidneys are normal, without renal calculi, focal lesion, or hydronephrosis. Bladder is unremarkable. Stomach/Bowel: Stomach is within normal limits. Appendix appears normal. No evidence of bowel wall thickening, distention, or inflammatory changes. Mild sigmoid diverticulosis. Vascular/Lymphatic: Aortic atherosclerosis. No enlarged abdominal or pelvic lymph nodes. Reproductive: Mild prostate enlargement. Other: No abdominal wall hernia or abnormality. No abdominopelvic ascites. Musculoskeletal: No acute fracture or destructive bony lesion identified. Mild multilevel discogenic degenerative changes of the thoracolumbar spine and moderate lower lumbar facet arthrosis. No high-grade bony canal stenosis. IMPRESSION: 1. Interstitial pulmonary edema. 2. Probable superimposed mild pulmonary fibrosis. 3. Severe coronary artery calcification.  Status post CABG. 4. Aortic valvular calcification associated with aortic stenosis. 5. Aortic atherosclerosis with extensive infrarenal calcification. 6. Cholelithiasis. 7. Sigmoid diverticulosis. Electronically Signed   By: Kristine Garbe M.D.   On: 12/29/2017 22:49   Dg Chest 2 View  Result Date: 12/29/2017 CLINICAL DATA:  Chest pain. History of MI, status post open heart surgery in 1989. EXAM: CHEST  2 VIEW COMPARISON:  Chest x-rays dated 10/28/2017 and 01/27/2017. FINDINGS: Heart size upper normal, stable. Overall cardiomediastinal silhouette is stable in size and configuration. Median sternotomy wires appear intact and stable  in alignment. Coarse lung markings noted bilaterally, increased compared to previous study, most suggestive of interstitial edema superimposed on chronic interstitial lung disease/fibrosis. Lucency is seen along the medial aspects of the left hemidiaphragm, suspicious for free intraperitoneal air. IMPRESSION: 1. Lucency along the medial aspect of the left hemidiaphragm, of uncertain etiology, possibly free intraperitoneal air within the upper abdomen. Recommend CT abdomen for further characterization. 2. Increasingly coarse lung markings bilaterally, most likely interstitial edema superimposed on worsening chronic interstitial lung disease/fibrosis. These results were called by telephone at the time of interpretation on 12/29/2017 at 9:53 pm to Dr. Rudene Re , who verbally acknowledged these results. Electronically Signed   By: Franki Cabot M.D.   On: 12/29/2017 21:54   Ct Chest Wo Contrast  Result Date: 12/29/2017 CLINICAL DATA:  84 y/o M; chest pain with history of bone, prostate, and blood cancer on chemotherapy. EXAM: CT CHEST, ABDOMEN AND PELVIS WITHOUT CONTRAST TECHNIQUE: Multidetector CT imaging of the chest, abdomen and pelvis was performed following the standard protocol without IV contrast. COMPARISON:  None. FINDINGS: CT CHEST FINDINGS Cardiovascular: Normal heart size. No pericardial effusion. Status post CABG with LIMA and saphenous grafts. Severe coronary artery calcification. Aortic valvular calcification associated with aortic stenosis. Normal caliber thoracic aorta and main pulmonary artery. Calcific atherosclerosis of aorta. Mediastinum/Nodes: No enlarged mediastinal, hilar, or axillary lymph nodes. Thyroid gland, trachea, and esophagus demonstrate no significant findings. Lungs/Pleura: Smooth interlobular septal thickening and mild peribronchial thickening compatible with interstitial pulmonary edema. No alveolar pulmonary edema. No pleural effusion. No pneumothorax. Coarse  reticulation of the lung peripheries greatest at lung bases, probably a component of mild pulmonary fibrosis. Musculoskeletal: Healed median sternotomy. Multiple chronic rib fractures bilaterally. No acute fracture or destructive bony lesion identified. CT ABDOMEN PELVIS FINDINGS Hepatobiliary: Liver segment 2 subcentimeter cyst. Otherwise no focal liver lesion identified. Cholelithiasis. No biliary ductal dilatation. Pancreas: Unremarkable. No  pancreatic ductal dilatation or surrounding inflammatory changes. Spleen: Normal in size without focal abnormality. Adrenals/Urinary Tract: Adrenal glands are unremarkable. Kidneys are normal, without renal calculi, focal lesion, or hydronephrosis. Bladder is unremarkable. Stomach/Bowel: Stomach is within normal limits. Appendix appears normal. No evidence of bowel wall thickening, distention, or inflammatory changes. Mild sigmoid diverticulosis. Vascular/Lymphatic: Aortic atherosclerosis. No enlarged abdominal or pelvic lymph nodes. Reproductive: Mild prostate enlargement. Other: No abdominal wall hernia or abnormality. No abdominopelvic ascites. Musculoskeletal: No acute fracture or destructive bony lesion identified. Mild multilevel discogenic degenerative changes of the thoracolumbar spine and moderate lower lumbar facet arthrosis. No high-grade bony canal stenosis. IMPRESSION: 1. Interstitial pulmonary edema. 2. Probable superimposed mild pulmonary fibrosis. 3. Severe coronary artery calcification.  Status post CABG. 4. Aortic valvular calcification associated with aortic stenosis. 5. Aortic atherosclerosis with extensive infrarenal calcification. 6. Cholelithiasis. 7. Sigmoid diverticulosis. Electronically Signed   By: Kristine Garbe M.D.   On: 12/29/2017 22:49    EKG: Orders placed or performed during the hospital encounter of 12/29/17  . ED EKG within 10 minutes  . ED EKG within 10 minutes    IMPRESSION AND PLAN:  1.  Acute pulmonary edema.  We  will start IV Lasix.  We will continue to monitor patient on telemetry and follow troponin level.  Most recent echocardiogram, done within 6 months shows reduced ejection fraction at 30-35%. 2.  Systolic heart failure with acute exacerbation, continue medical management. 3.  Acute renal failure, creatinine is elevated at 1.54, likely related to pulmonary edema.  Will monitor kidney function closely and avoid nephrotoxic medications.  4.  Minimally elevated troponin level, likely related to pulmonary edema.  Will follow troponin level and monitor patient on telemetry.  5.  Uncontrolled diabetes type 2.  We will restart home medications and follow low-carb diet.  Will monitor blood sugars before meals and at bedtime. 6.  Bone marrow cancer, on chemotherapy, stable.  Continue to follow with oncology as outpatient.  All the records are reviewed and case discussed with ED provider. Management plans discussed with the patient, family and they are in agreement.  CODE STATUS:    Code Status Orders  (From admission, onward)        Start     Ordered   12/30/17 0221  Full code  Continuous     12/30/17 0220    Code Status History    Date Active Date Inactive Code Status Order ID Comments User Context   10/29/2017 02:28 10/31/2017 19:00 Full Code [id number]  Lance Coon, MD ED   09/17/2017 00:58 09/20/2017 15:24 Full Code [id number]  Lance Coon, MD Inpatient   06/10/2017 03:24 06/11/2017 16:13 Full Code [id number]  Harrie Foreman, MD Inpatient   04/29/2017 05:12 04/30/2017 14:37 Full Code [id number]  Harrie Foreman, MD Inpatient   01/27/2017 23:56 01/29/2017 14:30 Full Code [id number]  Hugelmeyer, Ubaldo Glassing, DO Inpatient    Advance Directive Documentation     Most Recent Value  Type of Advance Directive  Living will  Pre-existing out of facility DNR order (yellow form or pink MOST form)  No data  "MOST" Form in Place?  No data       TOTAL TIME TAKING CARE OF THIS PATIENT: 40 minutes.    Amelia Jo M.D on 12/30/2017 at 3:19 AM  Between 7am to 6pm - Pager - [phone]  After 6pm go to [email or web address] - password [email or web address] [facility]  [facility]  Office  [phone]  CC: Primary care physician; Frazier Richards  Viona Gilmore, MD

## 2017-12-31 LAB — CBC
HEMATOCRIT: 29.5 % — AB (ref 40.0–52.0)
HEMOGLOBIN: 9.9 g/dL — AB (ref 13.0–18.0)
MCH: 30.5 pg (ref 26.0–34.0)
MCHC: 33.6 g/dL (ref 32.0–36.0)
MCV: 90.7 fL (ref 80.0–100.0)
Platelets: 99 10*3/uL — ABNORMAL LOW (ref 150–440)
RBC: 3.26 MIL/uL — AB (ref 4.40–5.90)
RDW: 17.2 % — ABNORMAL HIGH (ref 11.5–14.5)
WBC: 9.3 10*3/uL (ref 3.8–10.6)

## 2017-12-31 LAB — GLUCOSE, CAPILLARY: Glucose-Capillary: 106 mg/dL — ABNORMAL HIGH (ref 65–99)

## 2017-12-31 MED ORDER — FUROSEMIDE 20 MG PO TABS: 40.0000 mg | ORAL_TABLET | Freq: Two times a day (BID) | ORAL | 11 refills | Status: DC

## 2017-12-31 MED ORDER — CARVEDILOL 3.125 MG PO TABS: 3.1250 mg | ORAL_TABLET | Freq: Two times a day (BID) | ORAL | 0 refills | Status: DC

## 2017-12-31 NOTE — Progress Notes (Signed)
Pt to be discharged today. Iv and tele removed disch instructions given to pt and wife. disch via w.c. Accompanied by family

## 2017-12-31 NOTE — Progress Notes (Signed)
[facility]  SUBJECTIVE: Patient laying in bed comfortably, denies chest pain or shortness of breath   Vitals:   12/30/17 1729 12/30/17 1940 12/31/17 0450 12/31/17 0852  BP: (!) 103/52 (!) 103/46 (!) 106/53 (!) 122/55  Pulse: 71 72 66 64  Resp: _0 Temp: 97.7 F (36.5 C) 98.2 F (36.8 C) 98.2 F (36.8 C) (!) 97.5 F (36.4 C)  TempSrc: Oral Oral Oral Oral  SpO2: 97% 100% 97% 96%  Weight:   69.7 kg (153 lb 11.2 oz)   Height:         Intake/Output Summary (Last 24 hours) at 12/31/2017 0914 Last data filed at 12/30/2017 0944 Gross per 24 hour  Intake 240 ml  Output -  Net 240 ml      PHYSICAL EXAM  General: Well developed, well nourished, in no acute distress HEENT:  Normocephalic and atramatic Neck:  No JVD.  Lungs: Clear bilaterally to auscultation and percussion. Heart: HRRR . Normal S1 and S2 without gallops or murmurs.  Abdomen: Bowel sounds are positive, abdomen soft and non-tender  Msk:  Back normal, normal gait. Normal strength and tone for age. Extremities: No clubbing, cyanosis or edema.   Neuro: Alert and oriented X 3. Psych:  Good affect, responds appropriately   LABS: Basic Metabolic Panel: Recent Labs    12/29/17 2112 12/30/17 0714  NA 130* 133*  K 4.5 4.2  CL 100* 101  CO2 18* 22  GLUCOSE 393* 103*  BUN 18 18  CREATININE 1.54* 1.35*  CALCIUM 9.1 9.1   Liver Function Tests: No results for input(s): AST, ALT, ALKPHOS, BILITOT, PROT, ALBUMIN in the last 72 hours. No results for input(s): LIPASE, AMYLASE in the last 72 hours. CBC: Recent Labs    12/30/17 0714 12/31/17 0536  WBC 9.8 9.3  HGB 9.6* 9.9*  HCT 28.7* 29.5*  MCV 91.0 90.7  PLT 110* 99*   Cardiac Enzymes: Recent Labs    12/29/17 2112 12/30/17 0714  TROPONINI 0.04* 0.45*   BNP: Invalid input(s): POCBNP D-Dimer: No results for input(s): DDIMER in the last 72 hours. Hemoglobin A1C: No results for input(s): HGBA1C in the last 72 hours. Fasting Lipid Panel: No  results for input(s): CHOL, HDL, LDLCALC, TRIG, CHOLHDL, LDLDIRECT in the last 72 hours. Thyroid Function Tests: No results for input(s): TSH, T4TOTAL, T3FREE, THYROIDAB in the last 72 hours.  Invalid input(s): FREET3 Anemia Panel: No results for input(s): VITAMINB12, FOLATE, FERRITIN, TIBC, IRON, RETICCTPCT in the last 72 hours.  Ct Abdomen Pelvis Wo Contrast  Result Date: 12/29/2017 CLINICAL DATA:  84 y/o M; chest pain with history of bone, prostate, and blood cancer on chemotherapy. EXAM: CT CHEST, ABDOMEN AND PELVIS WITHOUT CONTRAST TECHNIQUE: Multidetector CT imaging of the chest, abdomen and pelvis was performed following the standard protocol without IV contrast. COMPARISON:  None. FINDINGS: CT CHEST FINDINGS Cardiovascular: Normal heart size. No pericardial effusion. Status post CABG with LIMA and saphenous grafts. Severe coronary artery calcification. Aortic valvular calcification associated with aortic stenosis. Normal caliber thoracic aorta and main pulmonary artery. Calcific atherosclerosis of aorta. Mediastinum/Nodes: No enlarged mediastinal, hilar, or axillary lymph nodes. Thyroid gland, trachea, and esophagus demonstrate no significant findings. Lungs/Pleura: Smooth interlobular septal thickening and mild peribronchial thickening compatible with interstitial pulmonary edema. No alveolar pulmonary edema. No pleural effusion. No pneumothorax. Coarse reticulation of the lung peripheries greatest at lung bases, probably a component of mild pulmonary fibrosis. Musculoskeletal: Healed median sternotomy. Multiple chronic rib fractures bilaterally. No acute fracture  or destructive bony lesion identified. CT ABDOMEN PELVIS FINDINGS Hepatobiliary: Liver segment 2 subcentimeter cyst. Otherwise no focal liver lesion identified. Cholelithiasis. No biliary ductal dilatation. Pancreas: Unremarkable. No pancreatic ductal dilatation or surrounding inflammatory changes. Spleen: Normal in size without focal  abnormality. Adrenals/Urinary Tract: Adrenal glands are unremarkable. Kidneys are normal, without renal calculi, focal lesion, or hydronephrosis. Bladder is unremarkable. Stomach/Bowel: Stomach is within normal limits. Appendix appears normal. No evidence of bowel wall thickening, distention, or inflammatory changes. Mild sigmoid diverticulosis. Vascular/Lymphatic: Aortic atherosclerosis. No enlarged abdominal or pelvic lymph nodes. Reproductive: Mild prostate enlargement. Other: No abdominal wall hernia or abnormality. No abdominopelvic ascites. Musculoskeletal: No acute fracture or destructive bony lesion identified. Mild multilevel discogenic degenerative changes of the thoracolumbar spine and moderate lower lumbar facet arthrosis. No high-grade bony canal stenosis. IMPRESSION: 1. Interstitial pulmonary edema. 2. Probable superimposed mild pulmonary fibrosis. 3. Severe coronary artery calcification.  Status post CABG. 4. Aortic valvular calcification associated with aortic stenosis. 5. Aortic atherosclerosis with extensive infrarenal calcification. 6. Cholelithiasis. 7. Sigmoid diverticulosis. Electronically Signed   By: Kristine Garbe M.D.   On: 12/29/2017 22:49   Dg Chest 2 View  Result Date: 12/29/2017 CLINICAL DATA:  Chest pain. History of MI, status post open heart surgery in 1989. EXAM: CHEST  2 VIEW COMPARISON:  Chest x-rays dated 10/28/2017 and 01/27/2017. FINDINGS: Heart size upper normal, stable. Overall cardiomediastinal silhouette is stable in size and configuration. Median sternotomy wires appear intact and stable in alignment. Coarse lung markings noted bilaterally, increased compared to previous study, most suggestive of interstitial edema superimposed on chronic interstitial lung disease/fibrosis. Lucency is seen along the medial aspects of the left hemidiaphragm, suspicious for free intraperitoneal air. IMPRESSION: 1. Lucency along the medial aspect of the left hemidiaphragm, of  uncertain etiology, possibly free intraperitoneal air within the upper abdomen. Recommend CT abdomen for further characterization. 2. Increasingly coarse lung markings bilaterally, most likely interstitial edema superimposed on worsening chronic interstitial lung disease/fibrosis. These results were called by telephone at the time of interpretation on 12/29/2017 at 9:53 pm to Dr. Rudene Re , who verbally acknowledged these results. Electronically Signed   By: Franki Cabot M.D.   On: 12/29/2017 21:54   Ct Chest Wo Contrast  Result Date: 12/29/2017 CLINICAL DATA:  84 y/o M; chest pain with history of bone, prostate, and blood cancer on chemotherapy. EXAM: CT CHEST, ABDOMEN AND PELVIS WITHOUT CONTRAST TECHNIQUE: Multidetector CT imaging of the chest, abdomen and pelvis was performed following the standard protocol without IV contrast. COMPARISON:  None. FINDINGS: CT CHEST FINDINGS Cardiovascular: Normal heart size. No pericardial effusion. Status post CABG with LIMA and saphenous grafts. Severe coronary artery calcification. Aortic valvular calcification associated with aortic stenosis. Normal caliber thoracic aorta and main pulmonary artery. Calcific atherosclerosis of aorta. Mediastinum/Nodes: No enlarged mediastinal, hilar, or axillary lymph nodes. Thyroid gland, trachea, and esophagus demonstrate no significant findings. Lungs/Pleura: Smooth interlobular septal thickening and mild peribronchial thickening compatible with interstitial pulmonary edema. No alveolar pulmonary edema. No pleural effusion. No pneumothorax. Coarse reticulation of the lung peripheries greatest at lung bases, probably a component of mild pulmonary fibrosis. Musculoskeletal: Healed median sternotomy. Multiple chronic rib fractures bilaterally. No acute fracture or destructive bony lesion identified. CT ABDOMEN PELVIS FINDINGS Hepatobiliary: Liver segment 2 subcentimeter cyst. Otherwise no focal liver lesion identified.  Cholelithiasis. No biliary ductal dilatation. Pancreas: Unremarkable. No pancreatic ductal dilatation or surrounding inflammatory changes. Spleen: Normal in size without focal abnormality. Adrenals/Urinary Tract: Adrenal glands are unremarkable. Kidneys are normal,  without renal calculi, focal lesion, or hydronephrosis. Bladder is unremarkable. Stomach/Bowel: Stomach is within normal limits. Appendix appears normal. No evidence of bowel wall thickening, distention, or inflammatory changes. Mild sigmoid diverticulosis. Vascular/Lymphatic: Aortic atherosclerosis. No enlarged abdominal or pelvic lymph nodes. Reproductive: Mild prostate enlargement. Other: No abdominal wall hernia or abnormality. No abdominopelvic ascites. Musculoskeletal: No acute fracture or destructive bony lesion identified. Mild multilevel discogenic degenerative changes of the thoracolumbar spine and moderate lower lumbar facet arthrosis. No high-grade bony canal stenosis. IMPRESSION: 1. Interstitial pulmonary edema. 2. Probable superimposed mild pulmonary fibrosis. 3. Severe coronary artery calcification.  Status post CABG. 4. Aortic valvular calcification associated with aortic stenosis. 5. Aortic atherosclerosis with extensive infrarenal calcification. 6. Cholelithiasis. 7. Sigmoid diverticulosis. Electronically Signed   By: Kristine Garbe M.D.   On: 12/29/2017 22:49     Echo   TELEMETRY: Sinus rhythm at 72 bpm:  ASSESSMENT AND PLAN:  Active Problems:   Pulmonary edema    1.  Acute on chronic systolic congestive heart failure, secondary to dietary noncompliance, resolved after diuresis with intravenous furosemide 2.  Complex coronary artery disease, status post CABG, status post multiple coronary stents, most recently BMS proximal RCA 01/2017, currently without chest pain, with borderline elevated troponin 0 0.04, 0.45, likely due to demand supply ischemia secondary to CHF, in the absence of chest pain or new ECG  changes 3.  Paroxysmal atrial fibrillation, currently in sinus rhythm 4.  Multiple myeloma  Recommendations  1.  Continue current medical therapy 2.  Defer full dose anticoagulation 3.  Strongly encouraged patient to adhere to low-sodium no added salt diet 4.  Defer further cardiac diagnostics at this time 5.  Ambulate, if patient does well, may discharge home 6.  Follow-up as outpatient in 1 week   [name], MD, PhD, [facility] 12/31/2017 9:14 AM

## 2017-12-31 NOTE — Progress Notes (Signed)
[name] at [facility]                                                                                                                                                                                  Patient Demographics   Dwayne Terry, is a 84 y.o. male, DOB - Apr 02, 1930, ZOX:[id number]  Admit date - 12/29/2017   Admitting Physician Amelia Jo, MD  Outpatient Primary MD for the patient is Kirk Ruths, MD   LOS - 1  Subjective: Feels well today, no shortness of breath, stable for discharge, told him that he can take Lasix 40 mg twice daily for 5 days and then 40 mg daily after that and follow with cardiologist Dr. Marthe Patch and also be careful with salt intake.   Review of Systems:   CONSTITUTIONAL: No documented fever. No fatigue, weakness. No weight gain, no weight loss.  EYES: No blurry or double vision.  ENT: No tinnitus. No postnasal drip. No redness of the oropharynx.  RESPIRATORY: No cough, no wheeze, no hemoptysis.  Positive dyspnea.  CARDIOVASCULAR: Positive chest pain. No orthopnea. No palpitations. No syncope.  GASTROINTESTINAL: No nausea, no vomiting or diarrhea. No abdominal pain. No melena or hematochezia.  GENITOURINARY: No dysuria or hematuria.  ENDOCRINE: No polyuria or nocturia. No heat or cold intolerance.  HEMATOLOGY: No anemia. No bruising. No bleeding.  INTEGUMENTARY: No rashes. No lesions.  MUSCULOSKELETAL: No arthritis. No swelling. No gout.  NEUROLOGIC: No numbness, tingling, or ataxia. No seizure-type activity.  PSYCHIATRIC: No anxiety. No insomnia. No ADD.    Vitals:   Vitals:   12/30/17 1729 12/30/17 1940 12/31/17 0450 12/31/17 0852  BP: (!) 103/52 (!) 103/46 (!) 106/53 (!) 122/55  Pulse: 71 72 66 64  Resp: 18 18 18 14   Temp: 97.7 F (36.5 C) 98.2 F (36.8 C) 98.2 F (36.8 C) (!) 97.5 F (36.4 C)  TempSrc: Oral Oral Oral Oral  SpO2: 97% 100% 97% 96%  Weight:   69.7 kg (153 lb 11.2 oz)   Height:         Wt Readings from Last 3 Encounters:  12/31/17 69.7 kg (153 lb 11.2 oz)  12/05/17 74.2 kg (163 lb 9.6 oz)  10/31/17 72.6 kg (160 lb)     Intake/Output Summary (Last 24 hours) at 12/31/2017 1428 Last data filed at 12/31/2017 1014 Gross per 24 hour  Intake 480 ml  Output -  Net 480 ml    Physical Exam:   GENERAL: Pleasant-appearing in no apparent distress.  HEAD, EYES, EARS, NOSE AND THROAT: Atraumatic, normocephalic. Extraocular muscles are intact. Pupils equal and reactive to light. Sclerae anicteric. No conjunctival injection. No oro-pharyngeal erythema.  NECK: Supple. There is no jugular venous distention. No bruits, no lymphadenopathy, no thyromegaly.  HEART: Regular rate and rhythm,. No murmurs, no rubs, no clicks.  LUNGS: Clear to auscultation bilaterally. No rales or rhonchi. No wheezes.  ABDOMEN: Soft, flat, nontender, nondistended. Has good bowel sounds. No hepatosplenomegaly appreciated.  EXTREMITIES: No evidence of any cyanosis, clubbing, or peripheral edema.  +2 pedal and radial pulses bilaterally.  NEUROLOGIC: The patient is alert, awake, and oriented x3 with no focal motor or sensory deficits appreciated bilaterally.  SKIN: Moist and warm with no rashes appreciated.  Psych: Not anxious, depressed LN: No inguinal LN enlargement    Antibiotics   Anti-infectives (From admission, onward)   Start     Dose/Rate Route Frequency Ordered Stop   12/30/17 1000  acyclovir (ZOVIRAX) 200 MG capsule 200 mg  Status:  Discontinued     200 mg Oral 2 times daily 12/30/17 0220 12/30/17 0308   12/30/17 0315  acyclovir (ZOVIRAX) 200 MG capsule 200 mg     200 mg Oral 2 times daily 12/30/17 0308        Medications   Scheduled Meds: . acyclovir  200 mg Oral BID  . aspirin EC  81 mg Oral Daily  . carvedilol  3.125 mg Oral BID WC  . clopidogrel  75 mg Oral Q breakfast  . docusate sodium  100 mg Oral BID  . finasteride  2.5 mg Oral Daily  . furosemide  40 mg Intravenous BID  .  heparin  5,000 Units Subcutaneous Q8H  . lisinopril  2.5 mg Oral Daily  . metFORMIN  500 mg Oral BID WC  . multivitamin with minerals  1 tablet Oral Daily  . pantoprazole sodium  20 mg Oral Daily  . potassium chloride SA  20 mEq Oral Daily  . pravastatin  10 mg Oral q1800   Continuous Infusions: PRN Meds:.acetaminophen **OR** acetaminophen, bisacodyl, HYDROcodone-acetaminophen, ondansetron **OR** ondansetron (ZOFRAN) IV, traZODone   Data Review:   Micro Results No results found for this or any previous visit (from the past 240 hour(s)).  Radiology Reports Ct Abdomen Pelvis Wo Contrast  Result Date: 12/29/2017 CLINICAL DATA:  84 y/o M; chest pain with history of bone, prostate, and blood cancer on chemotherapy. EXAM: CT CHEST, ABDOMEN AND PELVIS WITHOUT CONTRAST TECHNIQUE: Multidetector CT imaging of the chest, abdomen and pelvis was performed following the standard protocol without IV contrast. COMPARISON:  None. FINDINGS: CT CHEST FINDINGS Cardiovascular: Normal heart size. No pericardial effusion. Status post CABG with LIMA and saphenous grafts. Severe coronary artery calcification. Aortic valvular calcification associated with aortic stenosis. Normal caliber thoracic aorta and main pulmonary artery. Calcific atherosclerosis of aorta. Mediastinum/Nodes: No enlarged mediastinal, hilar, or axillary lymph nodes. Thyroid gland, trachea, and esophagus demonstrate no significant findings. Lungs/Pleura: Smooth interlobular septal thickening and mild peribronchial thickening compatible with interstitial pulmonary edema. No alveolar pulmonary edema. No pleural effusion. No pneumothorax. Coarse reticulation of the lung peripheries greatest at lung bases, probably a component of mild pulmonary fibrosis. Musculoskeletal: Healed median sternotomy. Multiple chronic rib fractures bilaterally. No acute fracture or destructive bony lesion identified. CT ABDOMEN PELVIS FINDINGS Hepatobiliary: Liver segment 2  subcentimeter cyst. Otherwise no focal liver lesion identified. Cholelithiasis. No biliary ductal dilatation. Pancreas: Unremarkable. No pancreatic ductal dilatation or surrounding inflammatory changes. Spleen: Normal in size without focal abnormality. Adrenals/Urinary Tract: Adrenal glands are unremarkable. Kidneys are normal, without renal calculi, focal lesion, or hydronephrosis. Bladder is unremarkable. Stomach/Bowel: Stomach is within normal limits. Appendix appears normal. No  evidence of bowel wall thickening, distention, or inflammatory changes. Mild sigmoid diverticulosis. Vascular/Lymphatic: Aortic atherosclerosis. No enlarged abdominal or pelvic lymph nodes. Reproductive: Mild prostate enlargement. Other: No abdominal wall hernia or abnormality. No abdominopelvic ascites. Musculoskeletal: No acute fracture or destructive bony lesion identified. Mild multilevel discogenic degenerative changes of the thoracolumbar spine and moderate lower lumbar facet arthrosis. No high-grade bony canal stenosis. IMPRESSION: 1. Interstitial pulmonary edema. 2. Probable superimposed mild pulmonary fibrosis. 3. Severe coronary artery calcification.  Status post CABG. 4. Aortic valvular calcification associated with aortic stenosis. 5. Aortic atherosclerosis with extensive infrarenal calcification. 6. Cholelithiasis. 7. Sigmoid diverticulosis. Electronically Signed   By: Kristine Garbe M.D.   On: 12/29/2017 22:49   Dg Chest 2 View  Result Date: 12/29/2017 CLINICAL DATA:  Chest pain. History of MI, status post open heart surgery in 1989. EXAM: CHEST  2 VIEW COMPARISON:  Chest x-rays dated 10/28/2017 and 01/27/2017. FINDINGS: Heart size upper normal, stable. Overall cardiomediastinal silhouette is stable in size and configuration. Median sternotomy wires appear intact and stable in alignment. Coarse lung markings noted bilaterally, increased compared to previous study, most suggestive of interstitial edema  superimposed on chronic interstitial lung disease/fibrosis. Lucency is seen along the medial aspects of the left hemidiaphragm, suspicious for free intraperitoneal air. IMPRESSION: 1. Lucency along the medial aspect of the left hemidiaphragm, of uncertain etiology, possibly free intraperitoneal air within the upper abdomen. Recommend CT abdomen for further characterization. 2. Increasingly coarse lung markings bilaterally, most likely interstitial edema superimposed on worsening chronic interstitial lung disease/fibrosis. These results were called by telephone at the time of interpretation on 12/29/2017 at 9:53 pm to Dr. Rudene Re , who verbally acknowledged these results. Electronically Signed   By: Franki Cabot M.D.   On: 12/29/2017 21:54   Ct Chest Wo Contrast  Result Date: 12/29/2017 CLINICAL DATA:  84 y/o M; chest pain with history of bone, prostate, and blood cancer on chemotherapy. EXAM: CT CHEST, ABDOMEN AND PELVIS WITHOUT CONTRAST TECHNIQUE: Multidetector CT imaging of the chest, abdomen and pelvis was performed following the standard protocol without IV contrast. COMPARISON:  None. FINDINGS: CT CHEST FINDINGS Cardiovascular: Normal heart size. No pericardial effusion. Status post CABG with LIMA and saphenous grafts. Severe coronary artery calcification. Aortic valvular calcification associated with aortic stenosis. Normal caliber thoracic aorta and main pulmonary artery. Calcific atherosclerosis of aorta. Mediastinum/Nodes: No enlarged mediastinal, hilar, or axillary lymph nodes. Thyroid gland, trachea, and esophagus demonstrate no significant findings. Lungs/Pleura: Smooth interlobular septal thickening and mild peribronchial thickening compatible with interstitial pulmonary edema. No alveolar pulmonary edema. No pleural effusion. No pneumothorax. Coarse reticulation of the lung peripheries greatest at lung bases, probably a component of mild pulmonary fibrosis. Musculoskeletal: Healed median  sternotomy. Multiple chronic rib fractures bilaterally. No acute fracture or destructive bony lesion identified. CT ABDOMEN PELVIS FINDINGS Hepatobiliary: Liver segment 2 subcentimeter cyst. Otherwise no focal liver lesion identified. Cholelithiasis. No biliary ductal dilatation. Pancreas: Unremarkable. No pancreatic ductal dilatation or surrounding inflammatory changes. Spleen: Normal in size without focal abnormality. Adrenals/Urinary Tract: Adrenal glands are unremarkable. Kidneys are normal, without renal calculi, focal lesion, or hydronephrosis. Bladder is unremarkable. Stomach/Bowel: Stomach is within normal limits. Appendix appears normal. No evidence of bowel wall thickening, distention, or inflammatory changes. Mild sigmoid diverticulosis. Vascular/Lymphatic: Aortic atherosclerosis. No enlarged abdominal or pelvic lymph nodes. Reproductive: Mild prostate enlargement. Other: No abdominal wall hernia or abnormality. No abdominopelvic ascites. Musculoskeletal: No acute fracture or destructive bony lesion identified. Mild multilevel discogenic degenerative changes of the thoracolumbar  spine and moderate lower lumbar facet arthrosis. No high-grade bony canal stenosis. IMPRESSION: 1. Interstitial pulmonary edema. 2. Probable superimposed mild pulmonary fibrosis. 3. Severe coronary artery calcification.  Status post CABG. 4. Aortic valvular calcification associated with aortic stenosis. 5. Aortic atherosclerosis with extensive infrarenal calcification. 6. Cholelithiasis. 7. Sigmoid diverticulosis. Electronically Signed   By: Kristine Garbe M.D.   On: 12/29/2017 22:49     CBC Recent Labs  Lab 12/29/17 2112 12/30/17 0714 12/31/17 0536  WBC 5.1 9.8 9.3  HGB 11.3* 9.6* 9.9*  HCT 34.2* 28.7* 29.5*  PLT 147* 110* 99*  MCV 91.5 91.0 90.7  MCH 30.1 30.6 30.5  MCHC 32.9 33.7 33.6  RDW 16.6* 16.7* 17.2*    Chemistries  Recent Labs  Lab 12/29/17 2112 12/30/17 0714  NA 130* 133*  K 4.5 4.2   CL 100* 101  CO2 18* 22  GLUCOSE 393* 103*  BUN 18 18  CREATININE 1.54* 1.35*  CALCIUM 9.1 9.1   ------------------------------------------------------------------------------------------------------------------ estimated creatinine clearance is 38.7 mL/min (A) (by C-G formula based on SCr of 1.35 mg/dL (H)). ------------------------------------------------------------------------------------------------------------------ No results for input(s): HGBA1C in the last 72 hours. ------------------------------------------------------------------------------------------------------------------ No results for input(s): CHOL, HDL, LDLCALC, TRIG, CHOLHDL, LDLDIRECT in the last 72 hours. ------------------------------------------------------------------------------------------------------------------ No results for input(s): TSH, T4TOTAL, T3FREE, THYROIDAB in the last 72 hours.  Invalid input(s): FREET3 ------------------------------------------------------------------------------------------------------------------ No results for input(s): VITAMINB12, FOLATE, FERRITIN, TIBC, IRON, RETICCTPCT in the last 72 hours.  Coagulation profile Recent Labs  Lab 12/29/17 2112  INR 1.02    No results for input(s): DDIMER in the last 72 hours.  Cardiac Enzymes Recent Labs  Lab 12/29/17 2112 12/30/17 0714  TROPONINI 0.04* 0.45*   ------------------------------------------------------------------------------------------------------------------ Invalid input(s): POCBNP    Assessment & Plan   1.   Acute on chronic systolic CHF: C discharge home today with Lasix 40 mg p.o. twice daily for 5 days followed by 40 mg p.o. daily.  2.    Chest pain with elevated troponin patient with known coronary artery disease has had multiple caths , seen by cardiology, no further cardiac intervention, cleared by cardiology for discharge, patient eager to go home.  Discharge instructions on the computer.  Can continue  Plavix, statins.   Acute renal failure, creatinine is elevated at 1.54, likely related to pulmonary edema.    4.  Uncontrolled diabetes type 2.    Continue sliding scale insulin continue Metformin 5.  Bone marrow cancer, on chemotherapy, stable.  Continue to follow with oncology as outpatient. 6.  Hyperlipidemia continue Pravachol      Code Status Orders  (From admission, onward)        Start     Ordered   12/30/17 0221  Full code  Continuous     12/30/17 0220    Code Status History    Date Active Date Inactive Code Status Order ID Comments User Context   10/29/2017 02:28 10/31/2017 19:00 Full Code [id number]  Lance Coon, MD ED   09/17/2017 00:58 09/20/2017 15:24 Full Code [id number]  Lance Coon, MD Inpatient   06/10/2017 03:24 06/11/2017 16:13 Full Code [id number]  Harrie Foreman, MD Inpatient   04/29/2017 05:12 04/30/2017 14:37 Full Code [id number]  Harrie Foreman, MD Inpatient   01/27/2017 23:56 01/29/2017 14:30 Full Code [id number]  Harvie Bridge, DO Inpatient    Advance Directive Documentation     Most Recent Value  Type of Advance Directive  Living will  Pre-existing out of facility DNR order (yellow form or pink MOST form)  No data  "MOST" Form in Place?  No data           Consults cardiology   DVT Prophylaxis SCDs  Lab Results  Component Value Date   PLT 99 (L) 12/31/2017     Time Spent in minutes45 minutes  Greater than 50% of time spent in care coordination and counseling patient regarding the condition and plan of care.   Epifanio Lesches M.D on 12/31/2017 at 2:28 PM  Between 7am to 6pm - Pager - [phone]  After 6pm go to [email or web address] - password [email or web address]   Office  [phone]

## 2018-01-02 ENCOUNTER — Encounter: Payer: Medicare HMO | Attending: Cardiovascular Disease

## 2018-01-02 DIAGNOSIS — Z8546 Personal history of malignant neoplasm of prostate: Secondary | ICD-10-CM | POA: Insufficient documentation

## 2018-01-02 DIAGNOSIS — Z87891 Personal history of nicotine dependence: Secondary | ICD-10-CM | POA: Insufficient documentation

## 2018-01-02 DIAGNOSIS — I214 Non-ST elevation (NSTEMI) myocardial infarction: Secondary | ICD-10-CM | POA: Insufficient documentation

## 2018-01-02 DIAGNOSIS — Z955 Presence of coronary angioplasty implant and graft: Secondary | ICD-10-CM | POA: Insufficient documentation

## 2018-01-02 DIAGNOSIS — Z79899 Other long term (current) drug therapy: Secondary | ICD-10-CM | POA: Insufficient documentation

## 2018-01-02 DIAGNOSIS — E119 Type 2 diabetes mellitus without complications: Secondary | ICD-10-CM | POA: Insufficient documentation

## 2018-01-02 DIAGNOSIS — E785 Hyperlipidemia, unspecified: Secondary | ICD-10-CM | POA: Insufficient documentation

## 2018-01-02 DIAGNOSIS — Z7984 Long term (current) use of oral hypoglycemic drugs: Secondary | ICD-10-CM | POA: Insufficient documentation

## 2018-01-02 DIAGNOSIS — Z7982 Long term (current) use of aspirin: Secondary | ICD-10-CM | POA: Insufficient documentation

## 2018-01-05 ENCOUNTER — Telehealth: Payer: Self-pay

## 2018-01-05 NOTE — Telephone Encounter (Signed)
Sees cardiologist the 12th - will ask about continuing HT - he states he has no stamina at this time - will call next week after speaking with his Dr

## 2018-01-05 NOTE — Discharge Summary (Signed)
Dwayne Terry, is a 84 y.o. male  DOB 1930/05/28  MRN [id number].  Admission date:  12/29/2017  Admitting Physician  Amelia Jo, MD  Discharge Date:  01/05/2018   Primary MD  Kirk Ruths, MD  Recommendations for primary care physician for things to follow:   Follow-up with PCP in 1 week Follow-up with primary cardiologist in 1 week with Dr. Saralyn Pilar.   Admission Diagnosis  Acute pulmonary edema (HCC) [J81.0] Hyperglycemia [R73.9] AKI (acute kidney injury) ([place]) [N17.9] Chest pain, unspecified type [R07.9]   Discharge Diagnosis  Acute pulmonary edema ([place]) [J81.0] Hyperglycemia [R73.9] AKI (acute kidney injury) ([place]) [N17.9] Chest pain, unspecified type [R07.9]    Active Problems:   Pulmonary edema      Past Medical History:  Diagnosis Date  . Cancer ([place])    bone marrow, prostate cancer  . Diabetes mellitus without complication ([place])   . Hyperlipidemia   . MI (mitral incompetence)   . MI (myocardial infarction) [facility])     Past Surgical History:  Procedure Laterality Date  . AORTIC VALVE REPLACEMENT (AVR)/CORONARY ARTERY BYPASS GRAFTING (CABG)    . CORONARY ANGIOPLASTY WITH STENT PLACEMENT    . CORONARY BALLOON ANGIOPLASTY N/A 06/10/2017   Procedure: Coronary Balloon Angioplasty;  Surgeon: Wellington Hampshire, MD;  Location: [place] [facility];  Service: Cardiovascular;  Laterality: N/A;  . CORONARY STENT INTERVENTION N/A 01/28/2017   Procedure: Coronary Stent Intervention;  Surgeon: [name], MD;  Location: [place] [facility];  Service: Cardiovascular;  Laterality: N/A;  . CORONARY STENT INTERVENTION N/A 04/29/2017   Procedure: Coronary Stent Intervention;  Surgeon: [name], MD;  Location: [place] [facility];  Service: Cardiovascular;  Laterality: N/A;  . CORONARY STENT  INTERVENTION N/A 09/19/2017   Procedure: CORONARY/GRAFT ANGIOGRAPHY;  Surgeon: Corey Skains, MD;  Location: [place] [facility];  Service: Cardiovascular;  Laterality: N/A;  . CORONARY STENT INTERVENTION N/A 09/19/2017   Procedure: CORONARY STENT INTERVENTION;  Surgeon: Wellington Hampshire, MD;  Location: [place] [facility];  Service: Cardiovascular;  Laterality: N/A;  . LEFT HEART CATH AND CORONARY ANGIOGRAPHY N/A 01/28/2017   Procedure: Left Heart Cath and Coronary Angiography;  Surgeon: [name], MD;  Location: [place] [facility];  Service: Cardiovascular;  Laterality: N/A;  . LEFT HEART CATH AND CORONARY ANGIOGRAPHY N/A 10/31/2017   Procedure: LEFT HEART CATH AND CORONARY ANGIOGRAPHY;  Surgeon: Teodoro Spray, MD;  Location: [place] [facility];  Service: Cardiovascular;  Laterality: N/A;  . LEFT HEART CATH AND CORS/GRAFTS ANGIOGRAPHY N/A 04/29/2017   Procedure: Left Heart Cath and Cors/Grafts Angiography;  Surgeon: [name], MD;  Location: [place] [facility];  Service: Cardiovascular;  Laterality: N/A;  . LEFT HEART CATH AND CORS/GRAFTS ANGIOGRAPHY N/A 06/10/2017   Procedure: Left Heart Cath and Cors/Grafts Angiography;  Surgeon: Corey Skains, MD;  Location: [place] [facility];  Service: Cardiovascular;  Laterality: N/A;  . LEFT HEART CATH AND CORS/GRAFTS ANGIOGRAPHY N/A 09/19/2017   Procedure: LEFT HEART CATH;  Surgeon: Corey Skains, MD;  Location: [place] [facility];  Service: Cardiovascular;  Laterality: N/A;       History of present illness and  Hospital Course:     Kindly see H&P for history of present illness and admission details, please review complete Labs, Consult reports and Test reports for all details in brief  HPI  from the history and physical done on the day of admission 84 year old male patient with history of multiple  myeloma on chemotherapy, history of CHF, diabetes mellitus type 2, CAD status post CABG came in because of  sudden onset of chest pain, shortness of breath, chest x-ray showed pulmonary edema.  Patient also found to have elevated blood sugar up to 393.   Hospital Course  Acute on chronic systolic heart failure: Improved with IV Lasix, discharged home with Lasix 40 mg p.o. twice daily for 5 days followed by 40 mg p.o. daily.  Seen by cardiology Dr. Christella Noa. shortness of breath resolved after diuresis. #2 complex coronary artery disease status post CABG, multiple stents, most recent  bare-metal stent in RCA in March 2018, patient did not have chest pain.  Borderline elevation of troponins up to 0.04, 0.45 due to supply demand ischemia due to CHF.  No EKG changes.   He  is on aspirin, statins, beta-blockers Plavix, lisinopril: Continue them. #3 multiple myeloma 4.  Proximal atrial fibrillation: #5 diabetes mellitus type 2: Resume metformin,  #6 acute renal failure secondary to pulmonary edema ATN: Improved from 1.54-1.35.   Discharge Condition: Stable  Follow UP  Follow-up Information    Kirk Ruths, MD Follow up in 1 week(s).   Specialty:  Internal Medicine Contact information: [place] [address] [phone]        [name], MD Follow up in 1 week(s).   Specialty:  Cardiology Contact information: [facility] [place] [address] [phone]             Discharge Instructions  and  Discharge Medications      Allergies as of 12/31/2017      Reactions   Antihistamines, Chlorpheniramine-type Other (See Comments)   Reaction: unknown   Nitroglycerin Other (See Comments)   Reaction: hypotension      Medication List    STOP taking these medications   acetaminophen 325 MG tablet Commonly known as:  TYLENOL     TAKE these medications   acyclovir 200 MG capsule Commonly known as:  ZOVIRAX Take 200 mg by mouth 2 (two) times daily.   aspirin EC 81 MG  tablet Take 1 tablet (81 mg total) by mouth daily.   carvedilol 3.125 MG tablet Commonly known as:  COREG Take 1 tablet (3.125 mg total) by mouth 2 (two) times daily with a meal. What changed:    medication strength  how much to take   clopidogrel 75 MG tablet Commonly known as:  PLAVIX Take 1 tablet (75 mg total) by mouth daily with breakfast.   dexamethasone 2 MG tablet Commonly known as:  DECADRON Take 8.5 mg by mouth See admin instructions. Take 8.5 mg on Thursday and Friday.   finasteride 5 MG tablet Commonly known as:  PROSCAR Take 2.5 mg by mouth daily.   furosemide 20 MG tablet Commonly known as:  LASIX Take 2 tablets (40 mg total) by mouth 2 (two) times daily. Take 40 mg bid for 5 days followed by 40 mg daily What changed:    how much to take  when to take this  additional instructions   lansoprazole 30 MG capsule Commonly known as:  PREVACID Take 30 mg by mouth 2 (two) times daily as needed.   lisinopril 2.5 MG tablet Commonly known as:  ZESTRIL Take 1 tablet (2.5 mg total) by mouth daily.   lovastatin 20 MG tablet Commonly known as:  MEVACOR Take 20 mg by mouth daily at 6 PM.   metFORMIN 500 MG  tablet Commonly known as:  GLUCOPHAGE Take 500 mg by mouth 2 (two) times daily with a meal.   MULTI-VITAMINS Tabs Take 1 tablet by mouth daily.   potassium chloride SA 20 MEQ tablet Commonly known as:  K-DUR,KLOR-CON Take 1 tablet (20 mEq total) by mouth daily.   silver sulfADIAZINE 1 % cream Commonly known as:  SILVADENE Apply to affected area daily         Diet and Activity recommendation: See Discharge Instructions above   Consults obtained -cardiology   Major procedures and Radiology Reports - PLEASE review detailed and final reports for all details, in brief -      Ct Abdomen Pelvis Wo Contrast  Result Date: 12/29/2017 CLINICAL DATA:  84 y/o M; chest pain with history of bone, prostate, and blood cancer on chemotherapy. EXAM: CT  CHEST, ABDOMEN AND PELVIS WITHOUT CONTRAST TECHNIQUE: Multidetector CT imaging of the chest, abdomen and pelvis was performed following the standard protocol without IV contrast. COMPARISON:  None. FINDINGS: CT CHEST FINDINGS Cardiovascular: Normal heart size. No pericardial effusion. Status post CABG with LIMA and saphenous grafts. Severe coronary artery calcification. Aortic valvular calcification associated with aortic stenosis. Normal caliber thoracic aorta and main pulmonary artery. Calcific atherosclerosis of aorta. Mediastinum/Nodes: No enlarged mediastinal, hilar, or axillary lymph nodes. Thyroid gland, trachea, and esophagus demonstrate no significant findings. Lungs/Pleura: Smooth interlobular septal thickening and mild peribronchial thickening compatible with interstitial pulmonary edema. No alveolar pulmonary edema. No pleural effusion. No pneumothorax. Coarse reticulation of the lung peripheries greatest at lung bases, probably a component of mild pulmonary fibrosis. Musculoskeletal: Healed median sternotomy. Multiple chronic rib fractures bilaterally. No acute fracture or destructive bony lesion identified. CT ABDOMEN PELVIS FINDINGS Hepatobiliary: Liver segment 2 subcentimeter cyst. Otherwise no focal liver lesion identified. Cholelithiasis. No biliary ductal dilatation. Pancreas: Unremarkable. No pancreatic ductal dilatation or surrounding inflammatory changes. Spleen: Normal in size without focal abnormality. Adrenals/Urinary Tract: Adrenal glands are unremarkable. Kidneys are normal, without renal calculi, focal lesion, or hydronephrosis. Bladder is unremarkable. Stomach/Bowel: Stomach is within normal limits. Appendix appears normal. No evidence of bowel wall thickening, distention, or inflammatory changes. Mild sigmoid diverticulosis. Vascular/Lymphatic: Aortic atherosclerosis. No enlarged abdominal or pelvic lymph nodes. Reproductive: Mild prostate enlargement. Other: No abdominal wall hernia or  abnormality. No abdominopelvic ascites. Musculoskeletal: No acute fracture or destructive bony lesion identified. Mild multilevel discogenic degenerative changes of the thoracolumbar spine and moderate lower lumbar facet arthrosis. No high-grade bony canal stenosis. IMPRESSION: 1. Interstitial pulmonary edema. 2. Probable superimposed mild pulmonary fibrosis. 3. Severe coronary artery calcification.  Status post CABG. 4. Aortic valvular calcification associated with aortic stenosis. 5. Aortic atherosclerosis with extensive infrarenal calcification. 6. Cholelithiasis. 7. Sigmoid diverticulosis. Electronically Signed   By: Kristine Garbe M.D.   On: 12/29/2017 22:49   Dg Chest 2 View  Result Date: 12/29/2017 CLINICAL DATA:  Chest pain. History of MI, status post open heart surgery in 1989. EXAM: CHEST  2 VIEW COMPARISON:  Chest x-rays dated 10/28/2017 and 01/27/2017. FINDINGS: Heart size upper normal, stable. Overall cardiomediastinal silhouette is stable in size and configuration. Median sternotomy wires appear intact and stable in alignment. Coarse lung markings noted bilaterally, increased compared to previous study, most suggestive of interstitial edema superimposed on chronic interstitial lung disease/fibrosis. Lucency is seen along the medial aspects of the left hemidiaphragm, suspicious for free intraperitoneal air. IMPRESSION: 1. Lucency along the medial aspect of the left hemidiaphragm, of uncertain etiology, possibly free intraperitoneal air within the upper abdomen. Recommend CT abdomen for  further characterization. 2. Increasingly coarse lung markings bilaterally, most likely interstitial edema superimposed on worsening chronic interstitial lung disease/fibrosis. These results were called by telephone at the time of interpretation on 12/29/2017 at 9:53 pm to Dr. Rudene Re , who verbally acknowledged these results. Electronically Signed   By: Franki Cabot M.D.   On: 12/29/2017 21:54    Ct Chest Wo Contrast  Result Date: 12/29/2017 CLINICAL DATA:  84 y/o M; chest pain with history of bone, prostate, and blood cancer on chemotherapy. EXAM: CT CHEST, ABDOMEN AND PELVIS WITHOUT CONTRAST TECHNIQUE: Multidetector CT imaging of the chest, abdomen and pelvis was performed following the standard protocol without IV contrast. COMPARISON:  None. FINDINGS: CT CHEST FINDINGS Cardiovascular: Normal heart size. No pericardial effusion. Status post CABG with LIMA and saphenous grafts. Severe coronary artery calcification. Aortic valvular calcification associated with aortic stenosis. Normal caliber thoracic aorta and main pulmonary artery. Calcific atherosclerosis of aorta. Mediastinum/Nodes: No enlarged mediastinal, hilar, or axillary lymph nodes. Thyroid gland, trachea, and esophagus demonstrate no significant findings. Lungs/Pleura: Smooth interlobular septal thickening and mild peribronchial thickening compatible with interstitial pulmonary edema. No alveolar pulmonary edema. No pleural effusion. No pneumothorax. Coarse reticulation of the lung peripheries greatest at lung bases, probably a component of mild pulmonary fibrosis. Musculoskeletal: Healed median sternotomy. Multiple chronic rib fractures bilaterally. No acute fracture or destructive bony lesion identified. CT ABDOMEN PELVIS FINDINGS Hepatobiliary: Liver segment 2 subcentimeter cyst. Otherwise no focal liver lesion identified. Cholelithiasis. No biliary ductal dilatation. Pancreas: Unremarkable. No pancreatic ductal dilatation or surrounding inflammatory changes. Spleen: Normal in size without focal abnormality. Adrenals/Urinary Tract: Adrenal glands are unremarkable. Kidneys are normal, without renal calculi, focal lesion, or hydronephrosis. Bladder is unremarkable. Stomach/Bowel: Stomach is within normal limits. Appendix appears normal. No evidence of bowel wall thickening, distention, or inflammatory changes. Mild sigmoid diverticulosis.  Vascular/Lymphatic: Aortic atherosclerosis. No enlarged abdominal or pelvic lymph nodes. Reproductive: Mild prostate enlargement. Other: No abdominal wall hernia or abnormality. No abdominopelvic ascites. Musculoskeletal: No acute fracture or destructive bony lesion identified. Mild multilevel discogenic degenerative changes of the thoracolumbar spine and moderate lower lumbar facet arthrosis. No high-grade bony canal stenosis. IMPRESSION: 1. Interstitial pulmonary edema. 2. Probable superimposed mild pulmonary fibrosis. 3. Severe coronary artery calcification.  Status post CABG. 4. Aortic valvular calcification associated with aortic stenosis. 5. Aortic atherosclerosis with extensive infrarenal calcification. 6. Cholelithiasis. 7. Sigmoid diverticulosis. Electronically Signed   By: Kristine Garbe M.D.   On: 12/29/2017 22:49    Micro Results     No results found for this or any previous visit (from the past 240 hour(s)).     Today   Subjective:   Dwayne Terry today has no headache,no chest abdominal pain,no new weakness tingling or numbness, feels much better wants to go home today.   Objective:   Blood pressure (!) 122/55, pulse 64, temperature (!) 97.5 F (36.4 C), temperature source Oral, resp. rate 14, height '5\' 8"'$  (1.727 m), weight 69.7 kg (153 lb 11.2 oz), SpO2 96 %.  No intake or output data in the 24 hours ending 01/05/18 1346  Exam Awake Alert, Oriented x 3, No new F.N deficits, Normal affect [place].AT,PERRAL Supple Neck,No JVD, No cervical lymphadenopathy appriciated.  Symmetrical Chest wall movement, Good air movement bilaterally, CTAB RRR,No Gallops,Rubs or new Murmurs, No Parasternal Heave +ve B.Sounds, Abd Soft, Non tender, No organomegaly appriciated, No rebound -guarding or rigidity. No Cyanosis, Clubbing or edema, No new Rash or bruise  Data Review   CBC w Diff:  Lab Results  Component Value Date   WBC 9.3 12/31/2017   HGB 9.9 (L) 12/31/2017   HGB 13.8  07/10/2013   HCT 29.5 (L) 12/31/2017   HCT 37.9 (L) 07/10/2013   PLT 99 (L) 12/31/2017   PLT 93 (L) 07/10/2013   LYMPHOPCT 7 10/28/2017   LYMPHOPCT 27.9 07/09/2013   MONOPCT 5 10/28/2017   MONOPCT 8 07/10/2013   MONOPCT 17.4 07/09/2013   EOSPCT 0 10/28/2017   EOSPCT 0.2 07/09/2013   BASOPCT 0 10/28/2017   BASOPCT 0.4 07/09/2013    CMP:  Lab Results  Component Value Date   NA 133 (L) 12/30/2017   NA 136 07/10/2013   K 4.2 12/30/2017   K 4.2 07/10/2013   CL 101 12/30/2017   CL 106 07/10/2013   CO2 22 12/30/2017   CO2 27 07/10/2013   BUN 18 12/30/2017   BUN 16 07/10/2013   CREATININE 1.35 (H) 12/30/2017   CREATININE 0.97 07/10/2013   PROT 6.9 10/28/2017   PROT 7.8 07/07/2013   ALBUMIN 3.6 10/28/2017   ALBUMIN 3.2 (L) 07/07/2013   BILITOT 0.6 10/28/2017   BILITOT 0.4 07/07/2013   ALKPHOS 73 10/28/2017   ALKPHOS 59 07/07/2013   AST 30 10/28/2017   AST 22 07/07/2013   ALT 24 10/28/2017   ALT 20 07/07/2013  .   Total Time in preparing paper work, data evaluation and todays exam - 62 minutes  Epifanio Lesches M.D on 01/05/2018 at 1:46 PM    Note: This dictation was prepared with Dragon dictation along with smaller phrase technology. Any transcriptional errors that result from this process are unintentional.

## 2018-01-14 ENCOUNTER — Observation Stay
Admission: EM | Admit: 2018-01-14 | Discharge: 2018-01-14 | Disposition: A | Discharge status: Home / Self Care | Payer: Medicare HMO | Source: Home / Self Care | Attending: Emergency Medicine | Admitting: Emergency Medicine

## 2018-01-14 ENCOUNTER — Emergency Department: Payer: Medicare HMO

## 2018-01-14 ENCOUNTER — Other Ambulatory Visit: Payer: Self-pay

## 2018-01-14 DIAGNOSIS — Z7984 Long term (current) use of oral hypoglycemic drugs: Secondary | ICD-10-CM | POA: Insufficient documentation

## 2018-01-14 DIAGNOSIS — Z952 Presence of prosthetic heart valve: Secondary | ICD-10-CM | POA: Insufficient documentation

## 2018-01-14 DIAGNOSIS — R778 Other specified abnormalities of plasma proteins: Secondary | ICD-10-CM

## 2018-01-14 DIAGNOSIS — Z9889 Other specified postprocedural states: Secondary | ICD-10-CM | POA: Insufficient documentation

## 2018-01-14 DIAGNOSIS — I110 Hypertensive heart disease with heart failure: Secondary | ICD-10-CM | POA: Insufficient documentation

## 2018-01-14 DIAGNOSIS — N170 Acute kidney failure with tubular necrosis: Secondary | ICD-10-CM | POA: Diagnosis not present

## 2018-01-14 DIAGNOSIS — I480 Paroxysmal atrial fibrillation: Secondary | ICD-10-CM | POA: Insufficient documentation

## 2018-01-14 DIAGNOSIS — R079 Chest pain, unspecified: Secondary | ICD-10-CM | POA: Diagnosis present

## 2018-01-14 DIAGNOSIS — Z7952 Long term (current) use of systemic steroids: Secondary | ICD-10-CM | POA: Insufficient documentation

## 2018-01-14 DIAGNOSIS — E119 Type 2 diabetes mellitus without complications: Secondary | ICD-10-CM

## 2018-01-14 DIAGNOSIS — I447 Left bundle-branch block, unspecified: Secondary | ICD-10-CM | POA: Insufficient documentation

## 2018-01-14 DIAGNOSIS — Z8546 Personal history of malignant neoplasm of prostate: Secondary | ICD-10-CM | POA: Insufficient documentation

## 2018-01-14 DIAGNOSIS — Z7902 Long term (current) use of antithrombotics/antiplatelets: Secondary | ICD-10-CM | POA: Insufficient documentation

## 2018-01-14 DIAGNOSIS — I2510 Atherosclerotic heart disease of native coronary artery without angina pectoris: Secondary | ICD-10-CM | POA: Insufficient documentation

## 2018-01-14 DIAGNOSIS — E785 Hyperlipidemia, unspecified: Secondary | ICD-10-CM | POA: Insufficient documentation

## 2018-01-14 DIAGNOSIS — Z951 Presence of aortocoronary bypass graft: Secondary | ICD-10-CM | POA: Insufficient documentation

## 2018-01-14 DIAGNOSIS — I5022 Chronic systolic (congestive) heart failure: Secondary | ICD-10-CM

## 2018-01-14 DIAGNOSIS — Z7982 Long term (current) use of aspirin: Secondary | ICD-10-CM

## 2018-01-14 DIAGNOSIS — Z888 Allergy status to other drugs, medicaments and biological substances status: Secondary | ICD-10-CM

## 2018-01-14 DIAGNOSIS — C9000 Multiple myeloma not having achieved remission: Secondary | ICD-10-CM | POA: Insufficient documentation

## 2018-01-14 DIAGNOSIS — R7989 Other specified abnormal findings of blood chemistry: Secondary | ICD-10-CM

## 2018-01-14 DIAGNOSIS — Z79899 Other long term (current) drug therapy: Secondary | ICD-10-CM

## 2018-01-14 DIAGNOSIS — Z87891 Personal history of nicotine dependence: Secondary | ICD-10-CM

## 2018-01-14 DIAGNOSIS — I252 Old myocardial infarction: Secondary | ICD-10-CM

## 2018-01-14 DIAGNOSIS — R748 Abnormal levels of other serum enzymes: Secondary | ICD-10-CM

## 2018-01-14 DIAGNOSIS — R0789 Other chest pain: Secondary | ICD-10-CM

## 2018-01-14 DIAGNOSIS — Z955 Presence of coronary angioplasty implant and graft: Secondary | ICD-10-CM

## 2018-01-14 DIAGNOSIS — I255 Ischemic cardiomyopathy: Secondary | ICD-10-CM | POA: Insufficient documentation

## 2018-01-14 DIAGNOSIS — Z8249 Family history of ischemic heart disease and other diseases of the circulatory system: Secondary | ICD-10-CM | POA: Insufficient documentation

## 2018-01-14 DIAGNOSIS — I200 Unstable angina: Secondary | ICD-10-CM

## 2018-01-14 DIAGNOSIS — R55 Syncope and collapse: Secondary | ICD-10-CM | POA: Diagnosis not present

## 2018-01-14 LAB — CBC WITH DIFFERENTIAL/PLATELET
BASOS ABS: 0 10*3/uL (ref 0–0.1)
BASOS PCT: 0 %
EOS ABS: 0 10*3/uL (ref 0–0.7)
Eosinophils Relative: 0 %
HCT: 34.2 % — ABNORMAL LOW (ref 40.0–52.0)
Hemoglobin: 11.8 g/dL — ABNORMAL LOW (ref 13.0–18.0)
Lymphocytes Relative: 4 %
Lymphs Abs: 0.4 10*3/uL — ABNORMAL LOW (ref 1.0–3.6)
MCH: 30.8 pg (ref 26.0–34.0)
MCHC: 34.4 g/dL (ref 32.0–36.0)
MCV: 89.4 fL (ref 80.0–100.0)
MONO ABS: 0.5 10*3/uL (ref 0.2–1.0)
Monocytes Relative: 6 %
NEUTROS ABS: 8.7 10*3/uL — AB (ref 1.4–6.5)
NEUTROS PCT: 90 %
Platelets: 161 10*3/uL (ref 150–440)
RBC: 3.82 MIL/uL — ABNORMAL LOW (ref 4.40–5.90)
RDW: 17.4 % — AB (ref 11.5–14.5)
WBC: 9.7 10*3/uL (ref 3.8–10.6)

## 2018-01-14 LAB — COMPREHENSIVE METABOLIC PANEL
ALBUMIN: 3.8 g/dL (ref 3.5–5.0)
ALT: 26 U/L (ref 17–63)
ANION GAP: 9 (ref 5–15)
AST: 31 U/L (ref 15–41)
Alkaline Phosphatase: 70 U/L (ref 38–126)
BILIRUBIN TOTAL: 0.7 mg/dL (ref 0.3–1.2)
BUN: 24 mg/dL — ABNORMAL HIGH (ref 6–20)
CHLORIDE: 103 mmol/L (ref 101–111)
CO2: 19 mmol/L — AB (ref 22–32)
Calcium: 9.3 mg/dL (ref 8.9–10.3)
Creatinine, Ser: 1.27 mg/dL — ABNORMAL HIGH (ref 0.61–1.24)
GFR calc Af Amer: 58 mL/min — ABNORMAL LOW (ref >60)
GFR calc non Af Amer: 50 mL/min — ABNORMAL LOW (ref >60)
GLUCOSE: 218 mg/dL — AB (ref 65–99)
Potassium: 4.8 mmol/L (ref 3.5–5.1)
SODIUM: 131 mmol/L — AB (ref 135–145)
TOTAL PROTEIN: 7.5 g/dL (ref 6.5–8.1)

## 2018-01-14 LAB — LIPID PANEL
CHOL/HDL RATIO: 2.1 ratio
Cholesterol: 79 mg/dL (ref 0–200)
HDL: 38 mg/dL — AB (ref >40)
LDL Cholesterol: 27 mg/dL (ref 0–99)
Triglycerides: 72 mg/dL (ref <150)
VLDL: 14 mg/dL (ref 0–40)

## 2018-01-14 LAB — TROPONIN I
TROPONIN I: 0.29 ng/mL — AB (ref <0.03)
TROPONIN I: 0.27 ng/mL — AB (ref <0.03)
Troponin I: 0.05 ng/mL (ref <0.03)
Troponin I: 0.24 ng/mL (ref <0.03)

## 2018-01-14 LAB — GLUCOSE, CAPILLARY
GLUCOSE-CAPILLARY: 104 mg/dL — AB (ref 65–99)
Glucose-Capillary: 197 mg/dL — ABNORMAL HIGH (ref 65–99)

## 2018-01-14 LAB — BRAIN NATRIURETIC PEPTIDE: B Natriuretic Peptide: 249 pg/mL — ABNORMAL HIGH (ref 0.0–100.0)

## 2018-01-14 MED ORDER — HEPARIN SODIUM (PORCINE) 5000 UNIT/ML IJ SOLN
5000.0000 [IU] | Freq: Three times a day (TID) | INTRAMUSCULAR | Status: DC
  Administered 2018-01-14: 5000 [IU] via SUBCUTANEOUS
  Filled 2018-01-14: qty 1

## 2018-01-14 MED ORDER — FINASTERIDE 5 MG PO TABS
2.5000 mg | ORAL_TABLET | Freq: Every day | ORAL | Status: DC
  Administered 2018-01-14: 2.5 mg via ORAL
  Filled 2018-01-14: qty 0.5
  Filled 2018-01-14: qty 1

## 2018-01-14 MED ORDER — PANTOPRAZOLE SODIUM 40 MG PO TBEC
40.0000 mg | DELAYED_RELEASE_TABLET | Freq: Every day | ORAL | Status: DC
  Administered 2018-01-14: 40 mg via ORAL
  Filled 2018-01-14: qty 1

## 2018-01-14 MED ORDER — CARVEDILOL 3.125 MG PO TABS
3.1250 mg | ORAL_TABLET | Freq: Two times a day (BID) | ORAL | Status: DC
  Administered 2018-01-14: 3.125 mg via ORAL
  Filled 2018-01-14: qty 1

## 2018-01-14 MED ORDER — INSULIN ASPART 100 UNIT/ML ~~LOC~~ SOLN: 0.0000 [IU] | Freq: Every day | SUBCUTANEOUS | Status: DC

## 2018-01-14 MED ORDER — CLOPIDOGREL BISULFATE 75 MG PO TABS
75.0000 mg | ORAL_TABLET | Freq: Every day | ORAL | Status: DC
  Administered 2018-01-14: 75 mg via ORAL
  Filled 2018-01-14: qty 1

## 2018-01-14 MED ORDER — ACETAMINOPHEN 325 MG PO TABS: 650.0000 mg | ORAL_TABLET | ORAL | Status: DC | PRN

## 2018-01-14 MED ORDER — ALPRAZOLAM 0.25 MG PO TABS: 0.2500 mg | ORAL_TABLET | Freq: Two times a day (BID) | ORAL | 0 refills | Status: DC | PRN

## 2018-01-14 MED ORDER — FUROSEMIDE 40 MG PO TABS
40.0000 mg | ORAL_TABLET | Freq: Two times a day (BID) | ORAL | Status: DC
  Administered 2018-01-14: 40 mg via ORAL
  Filled 2018-01-14: qty 1

## 2018-01-14 MED ORDER — INSULIN ASPART 100 UNIT/ML ~~LOC~~ SOLN
0.0000 [IU] | Freq: Three times a day (TID) | SUBCUTANEOUS | Status: DC
  Administered 2018-01-14: 3 [IU] via SUBCUTANEOUS
  Filled 2018-01-14: qty 1

## 2018-01-14 MED ORDER — LISINOPRIL 5 MG PO TABS
2.5000 mg | ORAL_TABLET | Freq: Every day | ORAL | Status: DC
  Administered 2018-01-14: 2.5 mg via ORAL
  Filled 2018-01-14: qty 1

## 2018-01-14 MED ORDER — POTASSIUM CHLORIDE CRYS ER 20 MEQ PO TBCR
20.0000 meq | EXTENDED_RELEASE_TABLET | Freq: Every day | ORAL | Status: DC
  Administered 2018-01-14: 20 meq via ORAL
  Filled 2018-01-14: qty 1

## 2018-01-14 MED ORDER — PRAVASTATIN SODIUM 40 MG PO TABS: 40.0000 mg | ORAL_TABLET | Freq: Every day | ORAL | Status: DC

## 2018-01-14 MED ORDER — ASPIRIN 81 MG PO CHEW
324.0000 mg | CHEWABLE_TABLET | Freq: Once | ORAL | Status: AC
  Administered 2018-01-14: 324 mg via ORAL
  Filled 2018-01-14: qty 4

## 2018-01-14 MED ORDER — ONDANSETRON HCL 4 MG/2ML IJ SOLN: 4.0000 mg | Freq: Four times a day (QID) | INTRAMUSCULAR | Status: DC | PRN

## 2018-01-14 MED ORDER — ALPRAZOLAM 0.25 MG PO TABS: 0.2500 mg | ORAL_TABLET | Freq: Two times a day (BID) | ORAL | Status: DC | PRN

## 2018-01-14 MED ORDER — ACYCLOVIR 200 MG PO CAPS
200.0000 mg | ORAL_CAPSULE | Freq: Two times a day (BID) | ORAL | Status: DC
  Administered 2018-01-14: 200 mg via ORAL
  Filled 2018-01-14 (×3): qty 1

## 2018-01-14 MED ORDER — ASPIRIN EC 81 MG PO TBEC
81.0000 mg | DELAYED_RELEASE_TABLET | Freq: Every day | ORAL | Status: DC
  Administered 2018-01-14: 81 mg via ORAL
  Filled 2018-01-14: qty 1

## 2018-01-14 MED ORDER — MORPHINE SULFATE (PF) 2 MG/ML IV SOLN: 2.0000 mg | INTRAVENOUS | Status: DC | PRN

## 2018-01-14 MED ORDER — SILVER SULFADIAZINE 1 % EX CREA
TOPICAL_CREAM | Freq: Two times a day (BID) | CUTANEOUS | Status: DC
  Filled 2018-01-14: qty 85

## 2018-01-14 MED ORDER — GI COCKTAIL ~~LOC~~
30.0000 mL | Freq: Four times a day (QID) | ORAL | Status: DC | PRN
  Filled 2018-01-14: qty 30

## 2018-01-14 MED ORDER — ADULT MULTIVITAMIN W/MINERALS CH
1.0000 | ORAL_TABLET | Freq: Every day | ORAL | Status: DC
  Administered 2018-01-14: 1 via ORAL
  Filled 2018-01-14: qty 1

## 2018-01-14 NOTE — ED Provider Notes (Signed)
[facility] Emergency Department Provider Note   ____________________________________________   First MD Initiated Contact with Patient 01/14/18 0008     (approximate)  I have reviewed the triage vital signs and the nursing notes.   HISTORY  Chief Complaint Chest Pain    HPI Dwayne Terry is a 84 y.o. male brought to the ED from home via EMS with a chief complaint of chest pain.  Patient has a significant medical history including multiple myeloma on chemotherapy, complex CAD status post CABG with multiple stents, CHF, diabetes who presents with a chief complaint of chest pain.  Reports he received IV fluids yesterday at the cancer center.  Today he went to [employer] and feels like he walked around more than he should.  Approximately 10 PM while at rest patient developed onset of central chest pressure.  Symptoms associated with dizziness.  Denies associated diaphoresis, shortness of breath, nausea/vomiting, palpitations.  Denies recent fever, chills, abdominal pain, dysuria, diarrhea.  Denies recent travel or trauma.  Chest pain currently 1/10 which is improved from prior to arrival.   Past Medical History:  Diagnosis Date  . Cancer ([place])    bone marrow, prostate cancer  . Diabetes mellitus without complication ([place])   . Hyperlipidemia   . MI (mitral incompetence)   . MI (myocardial infarction) [employer])     Patient Active Problem List   Diagnosis Date Noted  . Pulmonary edema 12/30/2017  . HLD (hyperlipidemia) 09/16/2017  . Diabetes ([place]) 09/16/2017  . Chest pain 04/29/2017  . NSTEMI (non-ST elevated myocardial infarction) ([place]) 04/29/2017  . Presence of coronary angioplasty implant and graft 03/21/2017  . Chest pain, rule out acute myocardial infarction 01/27/2017  . Dyspnea on exertion 09/06/2014  . Malignant neoplasm of prostate ([place]) 05/09/2014  . Atherosclerotic heart disease of native coronary artery without angina pectoris 07/10/2013  .  Essential (primary) hypertension 07/10/2013    Past Surgical History:  Procedure Laterality Date  . AORTIC VALVE REPLACEMENT (AVR)/CORONARY ARTERY BYPASS GRAFTING (CABG)    . CORONARY ANGIOPLASTY WITH STENT PLACEMENT    . CORONARY BALLOON ANGIOPLASTY N/A 06/10/2017   Procedure: Coronary Balloon Angioplasty;  Surgeon: [place] Hampshire, MD;  Location: [facility];  Service: Cardiovascular;  Laterality: N/A;  . CORONARY STENT INTERVENTION N/A 01/28/2017   Procedure: Coronary Stent Intervention;  Surgeon: [name], MD;  Location: [facility];  Service: Cardiovascular;  Laterality: N/A;  . CORONARY STENT INTERVENTION N/A 04/29/2017   Procedure: Coronary Stent Intervention;  Surgeon: [name], MD;  Location: [place] [facility];  Service: Cardiovascular;  Laterality: N/A;  . CORONARY STENT INTERVENTION N/A 09/19/2017   Procedure: CORONARY/GRAFT ANGIOGRAPHY;  Surgeon: Corey Skains, MD;  Location: [place] [facility];  Service: Cardiovascular;  Laterality: N/A;  . CORONARY STENT INTERVENTION N/A 09/19/2017   Procedure: CORONARY STENT INTERVENTION;  Surgeon: [place] Hampshire, MD;  Location: [place] [facility];  Service: Cardiovascular;  Laterality: N/A;  . LEFT HEART CATH AND CORONARY ANGIOGRAPHY N/A 01/28/2017   Procedure: Left Heart Cath and Coronary Angiography;  Surgeon: [name], MD;  Location: [place] [facility];  Service: Cardiovascular;  Laterality: N/A;  . LEFT HEART CATH AND CORONARY ANGIOGRAPHY N/A 10/31/2017   Procedure: LEFT HEART CATH AND CORONARY ANGIOGRAPHY;  Surgeon: Teodoro Spray, MD;  Location: [place] [facility];  Service: Cardiovascular;  Laterality: N/A;  . LEFT HEART CATH AND CORS/GRAFTS ANGIOGRAPHY N/A 04/29/2017   Procedure: Left Heart Cath and  Cors/Grafts Angiography;  Surgeon: [name], MD;  Location: [place] [facility];  Service: Cardiovascular;  Laterality: N/A;  . LEFT HEART CATH AND CORS/GRAFTS  ANGIOGRAPHY N/A 06/10/2017   Procedure: Left Heart Cath and Cors/Grafts Angiography;  Surgeon: Corey Skains, MD;  Location: [place] [facility];  Service: Cardiovascular;  Laterality: N/A;  . LEFT HEART CATH AND CORS/GRAFTS ANGIOGRAPHY N/A 09/19/2017   Procedure: LEFT HEART CATH;  Surgeon: Corey Skains, MD;  Location: [place] [facility];  Service: Cardiovascular;  Laterality: N/A;    Prior to Admission medications   Medication Sig Start Date End Date Taking? Authorizing Provider  acyclovir (ZOVIRAX) 200 MG capsule Take 200 mg by mouth 2 (two) times daily.    [provider]  aspirin EC 81 MG tablet Take 1 tablet (81 mg total) by mouth daily. 01/29/17   Loletha Grayer, MD  carvedilol (COREG) 3.125 MG tablet Take 1 tablet (3.125 mg total) by mouth 2 (two) times daily with a meal. 12/31/17   Epifanio Lesches, MD  clopidogrel (PLAVIX) 75 MG tablet Take 1 tablet (75 mg total) by mouth daily with breakfast. 04/30/17   Henreitta Leber, MD  dexamethasone (DECADRON) 2 MG tablet Take 8.5 mg by mouth See admin instructions. Take 8.5 mg on Thursday and Friday.    [provider]  finasteride (PROSCAR) 5 MG tablet Take 2.5 mg by mouth daily.     [provider]  furosemide (LASIX) 20 MG tablet Take 2 tablets (40 mg total) by mouth 2 (two) times daily. Take 40 mg bid for 5 days followed by 40 mg daily 12/31/17 12/31/18  Epifanio Lesches, MD  lansoprazole (PREVACID) 30 MG capsule Take 30 mg by mouth 2 (two) times daily as needed.  11/03/17 11/03/18  [provider]  lisinopril (ZESTRIL) 2.5 MG tablet Take 1 tablet (2.5 mg total) by mouth daily. 09/20/17 09/20/18  Dustin Flock, MD  lovastatin (MEVACOR) 20 MG tablet Take 20 mg by mouth daily at 6 PM.    [provider]  metFORMIN (GLUCOPHAGE) 500 MG tablet Take 500 mg by mouth 2 (two) times daily with a meal.    [provider]  Multiple Vitamin (MULTI-VITAMINS) TABS Take 1 tablet by mouth daily.     [provider]  potassium chloride SA (K-DUR,KLOR-CON) 20 MEQ tablet Take 1 tablet (20 mEq total) by mouth daily. 09/20/17   Dustin Flock, MD  silver sulfADIAZINE (SILVADENE) 1 % cream Apply to affected area daily 08/03/17 08/03/18  Duanne Guess, PA-C    Allergies Antihistamines, chlorpheniramine-type and Nitroglycerin  Family History  Problem Relation Age of Onset  . Heart disease Other     Social History Social History   Tobacco Use  . Smoking status: Former Smoker    Last attempt to quit: 2015    Years since quitting: 4.1  . Smokeless tobacco: Former Network engineer Use Topics  . Alcohol use: No  . Drug use: No    Review of Systems  Constitutional: No fever/chills. Eyes: No visual changes. ENT: No sore throat. Cardiovascular: Positive for chest pain. Respiratory: Denies shortness of breath. Gastrointestinal: No abdominal pain.  No nausea, no vomiting.  No diarrhea.  No constipation. Genitourinary: Negative for dysuria. Musculoskeletal: Negative for back pain. Skin: Negative for rash. Neurological: Positive for dizziness.  Negative for headaches, focal weakness or numbness.   ____________________________________________   PHYSICAL EXAM:  VITAL SIGNS: ED Triage Vitals  Enc Vitals Group     BP  Pulse      Resp      Temp      Temp src      SpO2      Weight      Height      Head Circumference      Peak Flow      Pain Score      Pain Loc      Pain Edu?      Excl. in [place]?     Constitutional: Alert and oriented. Well appearing and in no acute distress. Eyes: Conjunctivae are normal. PERRL. EOMI. Head: Atraumatic. Nose: No congestion/rhinnorhea. Mouth/Throat: Mucous membranes are moist.  Oropharynx non-erythematous. Neck: No stridor.  No carotid bruits. Cardiovascular: Normal rate, regular rhythm. Grossly normal heart sounds.  Good peripheral circulation. Respiratory: Normal respiratory effort.  No retractions. Lungs  CTAB. Gastrointestinal: Soft and nontender. No distention. No abdominal bruits. No CVA tenderness. Musculoskeletal: No lower extremity tenderness nor edema.  No joint effusions. Neurologic:  Normal speech and language. No gross focal neurologic deficits are appreciated.  Skin:  Skin is warm, dry and intact. No rash noted. Psychiatric: Mood and affect are normal. Speech and behavior are normal.  ____________________________________________   LABS (all labs ordered are listed, but only abnormal results are displayed)  Labs Reviewed  CBC WITH DIFFERENTIAL/PLATELET - Abnormal; Notable for the following components:      Result Value   RBC 3.82 (*)    Hemoglobin 11.8 (*)    HCT 34.2 (*)    RDW 17.4 (*)    Neutro Abs 8.7 (*)    Lymphs Abs 0.4 (*)    All other components within normal limits  COMPREHENSIVE METABOLIC PANEL - Abnormal; Notable for the following components:   Sodium 131 (*)    CO2 19 (*)    Glucose, Bld 218 (*)    BUN 24 (*)    Creatinine, Ser 1.27 (*)    GFR calc non Af Amer 50 (*)    GFR calc Af Amer 58 (*)    All other components within normal limits  TROPONIN I - Abnormal; Notable for the following components:   Troponin I 0.05 (*)    All other components within normal limits  BRAIN NATRIURETIC PEPTIDE   ____________________________________________  EKG  ED ECG REPORT I, Tahjai Schetter J, the attending physician, personally viewed and interpreted this ECG.   Date: 01/14/2018  EKG Time: 0016  Rate: 87  Rhythm: normal EKG, normal sinus rhythm  Axis: LAD  Intervals:left bundle branch block  ST&T Change: Nonspecific No significant change from 10/31/2017 ____________________________________________  RADIOLOGY  ED MD interpretation:  No pneumonia; improved pulmonary edema  Official radiology report(s): Dg Chest Port 1 View  Result Date: 01/14/2018 CLINICAL DATA:  Central chest pain today. EXAM: PORTABLE CHEST 1 VIEW COMPARISON:  Radiographs and CT 12/29/2017  FINDINGS: Post median sternotomy. Unchanged cardiomegaly and mediastinal contours. Improved pulmonary edema from prior exam with mild residual. Peripheral reticular opacities most prominent at the right lung base consistent with pulmonary fibrosis. No confluent consolidation. No large pleural effusion or pneumothorax. IMPRESSION: 1. Improved pulmonary edema from prior with mild residual. Stable cardiomegaly. 2. Probable pulmonary fibrosis. Electronically Signed   By: Jeb Levering M.D.   On: 01/14/2018 00:42    ____________________________________________   PROCEDURES  Procedure(s) performed: None  Procedures  Critical Care performed: No  ____________________________________________   INITIAL IMPRESSION / ASSESSMENT AND PLAN / ED COURSE  As part of my medical decision making, I reviewed the  following data within the [place] History obtained from family, Nursing notes reviewed and incorporated, Labs reviewed, EKG interpreted, Old EKG reviewed, Old chart reviewed, Radiograph reviewed, Discussed with admitting physician and Notes from prior ED visits.   84 year old male with a complex history of CAD status post CABG with multiple stents who presents with chest pain. Differential diagnosis includes, but is not limited to, ACS, aortic dissection, pulmonary embolism, cardiac tamponade, pneumothorax, pneumonia, pericarditis, myocarditis, GI-related causes including esophagitis/gastritis, and musculoskeletal chest wall pain.    Review of chart reveals patient was recently hospitalized for CHF exacerbation 2 weeks ago.  EKG is stable from 10/2017.  Patient states nitroglycerin bottoms out his blood pressure.  States his PCP told him to avoid additional aspirin products on top of the baby aspirin he already takes.  He is not allergic or anaphylactic to aspirin.  Feel benefit outweighs risk in this case; will administer 4 baby aspirin.  Clinical Course as of Jan 14 107  Sat  Jan 14, 2018  0107 Updated patient and family members of laboratory results.  Patient is resting in no acute distress.  Discussed with hospitalist who will evaluate patient in the emergency department for admission.  [JS]    Clinical Course User Index [JS] Paulette Blanch, MD     ____________________________________________   FINAL CLINICAL IMPRESSION(S) / ED DIAGNOSES  Final diagnoses:  Chest pain, unspecified type  Unstable angina ([place])  Elevated troponin     ED Discharge Orders    None       Note:  This document was prepared using Dragon voice recognition software and may include unintentional dictation errors.    Paulette Blanch, MD 01/14/18 0700

## 2018-01-14 NOTE — ED Notes (Signed)
Patient is resting comfortably. 

## 2018-01-14 NOTE — Progress Notes (Signed)
[name] to be D/C'd Home per MD order. Patient given discharge teaching and paperwork regarding medications, diet, follow-up appointments and activity. Patient understanding verbalized. No questions or complaints at this time. Skin condition as charted. IV and telemetry removed prior to leaving.  No further needs by Care Management/Social Work. Prescription handed to patient. .   An After Visit Summary was printed and given to the patient. Caregiver/family present during discharge teaching.    Dwayne Terry

## 2018-01-14 NOTE — ED Triage Notes (Signed)
Pt to the ER via EMS for chest pain157/80, 94 to 95% on room air. Heart rate 97. Temp 97.8 oral.  PCP told him no ASA and Nitro makes his BP bottom out. Right side elevation on EKG.

## 2018-01-14 NOTE — ED Notes (Signed)
Date and time results received: 01/14/18 0104 (use smartphrase ".now" to insert current time)  Test: Troponin Critical Value: 0.05  Name of Provider Notified: Beather Arbour  Orders Received? Or Actions Taken?: md notified

## 2018-01-14 NOTE — Discharge Summary (Signed)
[facility] [facility] [place] NAME: Dwayne Terry    MR#:  [id number]  DATE OF BIRTH:  17-Sep-1930  DATE OF ADMISSION:  01/14/2018 ADMITTING PHYSICIAN: Gorden Harms, MD  DATE OF DISCHARGE: 01/14/2018  PRIMARY CARE PHYSICIAN: Kirk Ruths, MD    ADMISSION DIAGNOSIS:  Unstable angina (Dwayne Terry) [I20.0] Elevated troponin [R74.8] Chest pain, unspecified type [R07.9]  DISCHARGE DIAGNOSIS:  Active Problems:   Chest pain   SECONDARY DIAGNOSIS:   Past Medical History:  Diagnosis Date  . Cancer (Dwayne Terry)    bone marrow, prostate cancer  . Diabetes mellitus without complication (Dwayne Terry)   . Hyperlipidemia   . MI (mitral incompetence)   . MI (myocardial infarction) [facility])     HOSPITAL COURSE:   1 acute chest pain Noted severe coronary artery disease status post CABG, coronary artery stenting x6 -most recent bare-metal stent in RCA in March 2018, chronically elevated troponins, cardiomyopathy with ejection fraction 30-35%, December 2018 heart cath noted for inoperable coronary artery disease  ruled out acute coronary syndrome with cardiac enzymes x3 sets, Appreciated consult cardiology for expert opinion, DAPT, Coreg, lisinopril, statin therapy-check lipids in the morning, and continue close medical monitoring  As per cardiologist, not much medication options for him, had hypotention with Imdur in past. Pt is pain free now and walked around nurses station. Suggest to d/c and follow with cardiology clinic next week. Most recent echocardiogram noted for ejection fraction 30/35%  Heart cath:  Dist LM to Ost LAD lesion is 100% stenosed.  Prox Cx lesion is 100% stenosed.  Dist RCA lesion is 60% stenosed.  Prox RCA lesion is 30% stenosed.  Mid RCA lesion is 50% stenosed.  RPDA lesion is 65% stenosed.  Dist Graft to Insertion lesion is 60% stenosed.  The left ventricular ejection fraction is 50-55% by visual estimate.  There is no  aortic valve stenosis.  There is no mitral valve stenosis and no mitral valve prolapse evident.  Full metal jacket rca with non critical disease. LIMA to lad patent to small lad SVG to om1 patent Occluded lad and lcx Distal rda 70-75%. Not amenable to pci. Medical management.   2 chronic systolic heart failure/ischemic cardiomyopathy with ejection fraction 30-35% without exacerbation Stable Continue DAPT, Coreg, Lasix, lisinopril, statin therapy, potassium replacement, low-sodium/cardiac/ADA diet  3 chronically elevated troponins Stable Most likely secondary to extensive coronary artery disease, congestive heart failure, and chronic ischemic cardiomyopathy Plan of care as stated above  4 chronic multiple myeloma Stable Receives chemotherapy Will need to continue to follow-up with oncology status post discharge for continued management  5 chronic paroxysmal afib Stable  Continue home regiment   6 chronic diabetes mellitus type 2  continue aspirin, lisinopril, sliding scale insulin with Accu-Cheks per routine and diet per above     DISCHARGE CONDITIONS:   Stable.  CONSULTS OBTAINED:  Treatment Team:  Corey Skains, MD  DRUG ALLERGIES:   Allergies  Allergen Reactions  . Antihistamines, Chlorpheniramine-Type Other (See Comments)    Reaction: unknown  . Nitroglycerin Other (See Comments)    Reaction: hypotension    DISCHARGE MEDICATIONS:   Allergies as of 01/14/2018      Reactions   Antihistamines, Chlorpheniramine-type Other (See Comments)   Reaction: unknown   Nitroglycerin Other (See Comments)   Reaction: hypotension      Medication List    TAKE these medications   acyclovir 200 MG capsule Commonly known as:  ZOVIRAX Take 200 mg by  mouth 2 (two) times daily.   ALPRAZolam 0.25 MG tablet Commonly known as:  XANAX Take 1 tablet (0.25 mg total) by mouth 2 (two) times daily as needed for anxiety.   aspirin EC 81 MG tablet Take 1 tablet (81  mg total) by mouth daily.   carvedilol 3.125 MG tablet Commonly known as:  COREG Take 1 tablet (3.125 mg total) by mouth 2 (two) times daily with a meal.   clopidogrel 75 MG tablet Commonly known as:  PLAVIX Take 1 tablet (75 mg total) by mouth daily with breakfast.   dexamethasone 2 MG tablet Commonly known as:  DECADRON Take 8.5 mg by mouth See admin instructions. Take 8.5 mg on Thursday and Friday.   finasteride 5 MG tablet Commonly known as:  PROSCAR Take 2.5 mg by mouth daily.   furosemide 20 MG tablet Commonly known as:  LASIX Take 2 tablets (40 mg total) by mouth 2 (two) times daily. Take 40 mg bid for 5 days followed by 40 mg daily   lansoprazole 30 MG capsule Commonly known as:  PREVACID Take 30 mg by mouth 2 (two) times daily as needed.   lisinopril 2.5 MG tablet Commonly known as:  ZESTRIL Take 1 tablet (2.5 mg total) by mouth daily.   lovastatin 20 MG tablet Commonly known as:  MEVACOR Take 20 mg by mouth daily at 6 PM.   metFORMIN 500 MG tablet Commonly known as:  GLUCOPHAGE Take 500 mg by mouth 2 (two) times daily with a meal.   MULTI-VITAMINS Tabs Take 1 tablet by mouth daily.   potassium chloride SA 20 MEQ tablet Commonly known as:  K-DUR,KLOR-CON Take 1 tablet (20 mEq total) by mouth daily.   silver sulfADIAZINE 1 % cream Commonly known as:  SILVADENE Apply to affected area daily        DISCHARGE INSTRUCTIONS:    Follow with Cardiologist clinic in 2 weeks.  If you experience worsening of your admission symptoms, develop shortness of breath, life threatening emergency, suicidal or homicidal thoughts you must seek medical attention immediately by calling 911 or calling your MD immediately  if symptoms less severe.  You Must read complete instructions/literature along with all the possible adverse reactions/side effects for all the Medicines you take and that have been prescribed to you. Take any new Medicines after you have completely  understood and accept all the possible adverse reactions/side effects.   Please note  You were cared for by a hospitalist during your hospital stay. If you have any questions about your discharge medications or the care you received while you were in the hospital after you are discharged, you can call the unit and asked to speak with the hospitalist on call if the hospitalist that took care of you is not available. Once you are discharged, your primary care physician will handle any further medical issues. Please note that NO REFILLS for any discharge medications will be authorized once you are discharged, as it is imperative that you return to your primary care physician (or establish a relationship with a primary care physician if you do not have one) for your aftercare needs so that they can reassess your need for medications and monitor your lab values.    Today   CHIEF COMPLAINT:   Chief Complaint  Patient presents with  . Chest Pain    HISTORY OF PRESENT ILLNESS:  Dwayne Terry  is a 84 y.o. male with a known history of severe coronary artery disease status post CABG,  coronary artery stenting x6, cardiomyopathy with ejection fraction 30-35%, heart cath done in December 2018 noted for inoperable coronary artery disease, chronically elevated troponins, recently discharged from the hospital this month on the second for congestive heart failure exacerbation, now presenting with mid chest pressure/pain associated with dizziness, nausea, emesis, lasting 45 minutes, and clammy per wife, denies shortness of breath, in the emergency room patient has had elevated troponin 0 0.05, EKG noted for chronic left bundle branch block, chest x-ray noted for improve congestive heart failure/probable pulmonary fibrosis, BNP 249, patient is now been admitted for acute chest pain probable angina.   VITAL SIGNS:  Blood pressure (!) 110/55, pulse 70, temperature 97.7 F (36.5 C), temperature source Oral, resp.  rate 18, height '5\' 7"'$  (1.702 m), weight 68.1 kg (150 lb 1.6 oz), SpO2 98 %.  I/O:    Intake/Output Summary (Last 24 hours) at 01/14/2018 1236 Last data filed at 01/14/2018 0711 Gross per 24 hour  Intake -  Output 450 ml  Net -450 ml    PHYSICAL EXAMINATION:   GENERAL:  85 y.o.-year-old patient lying in the bed with no acute distress.  EYES: Pupils equal, round, reactive to light and accommodation. No scleral icterus. Extraocular muscles intact.  HEENT: Head atraumatic, normocephalic. Oropharynx and nasopharynx clear.  NECK:  Supple, no jugular venous distention. No thyroid enlargement, no tenderness.  LUNGS: Normal breath sounds bilaterally, no wheezing, rales,rhonchi or crepitation. No use of accessory muscles of respiration.  CARDIOVASCULAR: S1, S2 normal. No murmurs, rubs, or gallops.  ABDOMEN: Soft, nontender, nondistended. Bowel sounds present. No organomegaly or mass.  EXTREMITIES: No pedal edema, cyanosis, or clubbing.  NEUROLOGIC: Cranial nerves II through XII are intact. MAES. Gait not checked.  PSYCHIATRIC: The patient is alert and oriented x 3.  SKIN: No obvious rash, lesion, or ulcer.     DATA REVIEW:   CBC Recent Labs  Lab 01/14/18 0030  WBC 9.7  HGB 11.8*  HCT 34.2*  PLT 161    Chemistries  Recent Labs  Lab 01/14/18 0030  NA 131*  K 4.8  CL 103  CO2 19*  GLUCOSE 218*  BUN 24*  CREATININE 1.27*  CALCIUM 9.3  AST 31  ALT 26  ALKPHOS 70  BILITOT 0.7    Cardiac Enzymes Recent Labs  Lab 01/14/18 0938  TROPONINI 0.29*    Microbiology Results  No results found for this or any previous visit.  RADIOLOGY:  Dg Chest Port 1 View  Result Date: 01/14/2018 CLINICAL DATA:  Central chest pain today. EXAM: PORTABLE CHEST 1 VIEW COMPARISON:  Radiographs and CT 12/29/2017 FINDINGS: Post median sternotomy. Unchanged cardiomegaly and mediastinal contours. Improved pulmonary edema from prior exam with mild residual. Peripheral reticular opacities most  prominent at the right lung base consistent with pulmonary fibrosis. No confluent consolidation. No large pleural effusion or pneumothorax. IMPRESSION: 1. Improved pulmonary edema from prior with mild residual. Stable cardiomegaly. 2. Probable pulmonary fibrosis. Electronically Signed   By: Jeb Levering M.D.   On: 01/14/2018 00:42    EKG:   Orders placed or performed during the hospital encounter of 01/14/18  . ED EKG  . ED EKG      Management plans discussed with the patient, family and they are in agreement.  CODE STATUS:     Code Status Orders  (From admission, onward)        Start     Ordered   01/14/18 0336  Full code  Continuous     01/14/18 0335  Code Status History    Date Active Date Inactive Code Status Order ID Comments User Context   12/30/2017 02:20 12/31/2017 17:03 Full Code [id number]  Amelia Jo, MD Inpatient   10/29/2017 02:28 10/31/2017 19:00 Full Code [id number]  Lance Coon, MD ED   09/17/2017 00:58 09/20/2017 15:24 Full Code [id number]  Lance Coon, MD Inpatient   06/10/2017 03:24 06/11/2017 16:13 Full Code [id number]  Harrie Foreman, MD Inpatient   04/29/2017 05:12 04/30/2017 14:37 Full Code [id number]  Harrie Foreman, MD Inpatient   01/27/2017 23:56 01/29/2017 14:30 Full Code [id number]  Harvie Bridge, DO Inpatient    Advance Directive Documentation     Most Recent Value  Type of Advance Directive  Healthcare Power of Attorney  Pre-existing out of facility DNR order (yellow form or pink MOST form)  No data  "MOST" Form in Place?  No data      TOTAL TIME TAKING CARE OF THIS PATIENT: 35 minutes.    Vaughan Basta M.D on 01/14/2018 at 12:36 PM  Between 7am to 6pm - Pager - [phone]  After 6pm go to [email or web address] - password [email or web address] [facility]  Office  [phone]  CC: Primary care physician; Kirk Ruths, MD   Note: This dictation was prepared with Dragon dictation along with smaller phrase  technology. Any transcriptional errors that result from this process are unintentional.

## 2018-01-14 NOTE — Progress Notes (Signed)
Pt arrived via stretcher from ED with family at bedside. Pt A&O, with no complaints of pain at this time. Telemetry monitor applied and called to CCMD. Skin assessed by second Rn. Oriented to room, call bell, bed, phone. Booklet giiven

## 2018-01-14 NOTE — Plan of Care (Signed)
  Progressing Activity: Ability to tolerate increased activity will improve 01/14/2018 0527 - Progressing by Jeri Cos, RN Cardiac: Ability to achieve and maintain adequate cardiovascular perfusion will improve 01/14/2018 0527 - Progressing by Jeri Cos, RN

## 2018-01-14 NOTE — H&P (Signed)
[facility] [place] NAME: Dwayne Terry    MR#:  [id number]  DATE OF BIRTH:  1930-10-07  DATE OF ADMISSION:  01/14/2018  PRIMARY CARE PHYSICIAN: Kirk Ruths, MD   REQUESTING/REFERRING PHYSICIAN:   CHIEF COMPLAINT:   Chief Complaint  Patient presents with  . Chest Pain    HISTORY OF PRESENT ILLNESS: Dwayne Terry  is a 84 y.o. male with a known history severe coronary artery disease status post CABG, coronary artery stenting x6, cardiomyopathy with ejection fraction 30-35%, heart cath done in December 2018 noted for inoperable coronary artery disease, chronically elevated troponins, recently discharged from the hospital this month on the second for congestive heart failure exacerbation, now presenting with mid chest pressure/pain associated with dizziness, nausea, emesis, lasting 45 minutes, and clammy per wife, denies shortness of breath, in the emergency room patient has had elevated troponin 0 0.05, EKG noted for chronic left bundle branch block, chest x-ray noted for improve congestive heart failure/probable pulmonary fibrosis, BNP 249, patient is now been admitted for acute chest pain probable angina.  PAST MEDICAL HISTORY:   Past Medical History:  Diagnosis Date  . Cancer ([place])    bone marrow, prostate cancer  . Diabetes mellitus without complication ([place])   . Hyperlipidemia   . MI (mitral incompetence)   . MI (myocardial infarction) ([place])     PAST SURGICAL HISTORY:  Past Surgical History:  Procedure Laterality Date  . AORTIC VALVE REPLACEMENT (AVR)/CORONARY ARTERY BYPASS GRAFTING (CABG)    . CORONARY ANGIOPLASTY WITH STENT PLACEMENT    . CORONARY BALLOON ANGIOPLASTY N/A 06/10/2017   Procedure: Coronary Balloon Angioplasty;  Surgeon: Wellington Hampshire, MD;  Location: [facility];  Service: Cardiovascular;  Laterality: N/A;  . CORONARY STENT INTERVENTION N/A 01/28/2017   Procedure: Coronary Stent Intervention;   Surgeon: [name], MD;  Location: [facility];  Service: Cardiovascular;  Laterality: N/A;  . CORONARY STENT INTERVENTION N/A 04/29/2017   Procedure: Coronary Stent Intervention;  Surgeon: [name], MD;  Location: [facility];  Service: Cardiovascular;  Laterality: N/A;  . CORONARY STENT INTERVENTION N/A 09/19/2017   Procedure: CORONARY/GRAFT ANGIOGRAPHY;  Surgeon: Corey Skains, MD;  Location: [facility];  Service: Cardiovascular;  Laterality: N/A;  . CORONARY STENT INTERVENTION N/A 09/19/2017   Procedure: CORONARY STENT INTERVENTION;  Surgeon: Wellington Hampshire, MD;  Location: [facility];  Service: Cardiovascular;  Laterality: N/A;  . LEFT HEART CATH AND CORONARY ANGIOGRAPHY N/A 01/28/2017   Procedure: Left Heart Cath and Coronary Angiography;  Surgeon: [name], MD;  Location: [facility];  Service: Cardiovascular;  Laterality: N/A;  . LEFT HEART CATH AND CORONARY ANGIOGRAPHY N/A 10/31/2017   Procedure: LEFT HEART CATH AND CORONARY ANGIOGRAPHY;  Surgeon: Teodoro Spray, MD;  Location: [facility];  Service: Cardiovascular;  Laterality: N/A;  . LEFT HEART CATH AND CORS/GRAFTS ANGIOGRAPHY N/A 04/29/2017   Procedure: Left Heart Cath and Cors/Grafts Angiography;  Surgeon: [name], MD;  Location: [facility];  Service: Cardiovascular;  Laterality: N/A;  . LEFT HEART CATH AND CORS/GRAFTS ANGIOGRAPHY N/A 06/10/2017   Procedure: Left Heart Cath and Cors/Grafts Angiography;  Surgeon: Corey Skains, MD;  Location: [facility];  Service: Cardiovascular;  Laterality: N/A;  . LEFT HEART CATH AND CORS/GRAFTS ANGIOGRAPHY N/A 09/19/2017   Procedure: LEFT HEART CATH;  Surgeon: Corey Skains, MD;  Location: [facility];  Service: Cardiovascular;  Laterality: N/A;    SOCIAL HISTORY:  Social History   Tobacco Use  . Smoking status: Former Smoker    Last attempt to quit: 2015     Years since quitting: 4.1  . Smokeless tobacco: Former Network engineer Use Topics  . Alcohol use: No    FAMILY HISTORY:  HTN CAD  DRUG ALLERGIES:  Allergies  Allergen Reactions  . Antihistamines, Chlorpheniramine-Type Other (See Comments)    Reaction: unknown  . Nitroglycerin Other (See Comments)    Reaction: hypotension    REVIEW OF SYSTEMS:   CONSTITUTIONAL: No fever, fatigue or weakness.  EYES: No blurred or double vision.  EARS, NOSE, AND THROAT: No tinnitus or ear pain.  RESPIRATORY: No cough, shortness of breath, wheezing or hemoptysis.  CARDIOVASCULAR: No chest pain, orthopnea, edema.  GASTROINTESTINAL: No nausea, vomiting, diarrhea or abdominal pain.  GENITOURINARY: No dysuria, hematuria.  ENDOCRINE: No polyuria, nocturia,  HEMATOLOGY: No anemia, easy bruising or bleeding SKIN: No rash or lesion. MUSCULOSKELETAL: No joint pain or arthritis.   NEUROLOGIC: No tingling, numbness, weakness.  PSYCHIATRY: No anxiety or depression.   MEDICATIONS AT HOME:  Prior to Admission medications   Medication Sig Start Date End Date Taking? Authorizing Provider  acyclovir (ZOVIRAX) 200 MG capsule Take 200 mg by mouth 2 (two) times daily.   Yes [provider]  aspirin EC 81 MG tablet Take 1 tablet (81 mg total) by mouth daily. 01/29/17  Yes Loletha Grayer, MD  carvedilol (COREG) 3.125 MG tablet Take 1 tablet (3.125 mg total) by mouth 2 (two) times daily with a meal. 12/31/17  Yes Epifanio Lesches, MD  clopidogrel (PLAVIX) 75 MG tablet Take 1 tablet (75 mg total) by mouth daily with breakfast. 04/30/17  Yes Sainani, Belia Heman, MD  dexamethasone (DECADRON) 2 MG tablet Take 8.5 mg by mouth See admin instructions. Take 8.5 mg on Thursday and Friday.   Yes [provider]  finasteride (PROSCAR) 5 MG tablet Take 2.5 mg by mouth daily.    Yes [provider]  furosemide (LASIX) 20 MG tablet Take 2 tablets (40 mg total) by mouth 2 (two) times daily. Take 40 mg  bid for 5 days followed by 40 mg daily 12/31/17 12/31/18 Yes Epifanio Lesches, MD  lansoprazole (PREVACID) 30 MG capsule Take 30 mg by mouth 2 (two) times daily as needed.  11/03/17 11/03/18 Yes [provider]  lisinopril (ZESTRIL) 2.5 MG tablet Take 1 tablet (2.5 mg total) by mouth daily. 09/20/17 09/20/18 Yes Dustin Flock, MD  lovastatin (MEVACOR) 20 MG tablet Take 20 mg by mouth daily at 6 PM.   Yes [provider]  metFORMIN (GLUCOPHAGE) 500 MG tablet Take 500 mg by mouth 2 (two) times daily with a meal.   Yes [provider]  Multiple Vitamin (MULTI-VITAMINS) TABS Take 1 tablet by mouth daily.   Yes [provider]  potassium chloride SA (K-DUR,KLOR-CON) 20 MEQ tablet Take 1 tablet (20 mEq total) by mouth daily. 09/20/17  Yes Dustin Flock, MD  silver sulfADIAZINE (SILVADENE) 1 % cream Apply to affected area daily 08/03/17 08/03/18 Yes Gaines, Marijo Conception, PA-C      PHYSICAL EXAMINATION:   VITAL SIGNS: Blood pressure 118/69, pulse 90, resp. rate (!) 22, height _0  (1.727 m), weight 66.7 kg (147 lb), SpO2 95 %.  GENERAL:  84 y.o.-year-old patient lying in the bed with no acute distress.  EYES: Pupils equal, round, reactive to light and accommodation. No scleral icterus. Extraocular muscles  intact.  HEENT: Head atraumatic, normocephalic. Oropharynx and nasopharynx clear.  NECK:  Supple, no jugular venous distention. No thyroid enlargement, no tenderness.  LUNGS: Normal breath sounds bilaterally, no wheezing, rales,rhonchi or crepitation. No use of accessory muscles of respiration.  CARDIOVASCULAR: S1, S2 normal. No murmurs, rubs, or gallops.  ABDOMEN: Soft, nontender, nondistended. Bowel sounds present. No organomegaly or mass.  EXTREMITIES: No pedal edema, cyanosis, or clubbing.  NEUROLOGIC: Cranial nerves II through XII are intact. MAES. Gait not checked.  PSYCHIATRIC: The patient is alert and oriented x 3.  SKIN: No obvious rash, lesion, or ulcer.    LABORATORY PANEL:   CBC Recent Labs  Lab 01/14/18 0030  WBC 9.7  HGB 11.8*  HCT 34.2*  PLT 161  MCV 89.4  MCH 30.8  MCHC 34.4  RDW 17.4*  LYMPHSABS 0.4*  MONOABS 0.5  EOSABS 0.0  BASOSABS 0.0   ------------------------------------------------------------------------------------------------------------------  Chemistries  Recent Labs  Lab 01/14/18 0030  NA 131*  K 4.8  CL 103  CO2 19*  GLUCOSE 218*  BUN 24*  CREATININE 1.27*  CALCIUM 9.3  AST 31  ALT 26  ALKPHOS 70  BILITOT 0.7   ------------------------------------------------------------------------------------------------------------------ estimated creatinine clearance is 40.1 mL/min (A) (by C-G formula based on SCr of 1.27 mg/dL (H)). ------------------------------------------------------------------------------------------------------------------ No results for input(s): TSH, T4TOTAL, T3FREE, THYROIDAB in the last 72 hours.  Invalid input(s): FREET3   Coagulation profile No results for input(s): INR, PROTIME in the last 168 hours. ------------------------------------------------------------------------------------------------------------------- No results for input(s): DDIMER in the last 72 hours. -------------------------------------------------------------------------------------------------------------------  Cardiac Enzymes Recent Labs  Lab 01/14/18 0030  TROPONINI 0.05*   ------------------------------------------------------------------------------------------------------------------ Invalid input(s): POCBNP  ---------------------------------------------------------------------------------------------------------------  Urinalysis    Component Value Date/Time   COLORURINE YELLOW 07/07/2013 [place] 07/07/2013 1646   LABSPEC 1.020 07/07/2013 1646   PHURINE 6.0 07/07/2013 1646   GLUCOSEU NEGATIVE 07/07/2013 1646   HGBUR NEGATIVE 07/07/2013 1646   BILIRUBINUR  NEGATIVE 07/07/2013 1646   KETONESUR NEGATIVE 07/07/2013 1646   PROTEINUR NEGATIVE 07/07/2013 1646   NITRITE NEGATIVE 07/07/2013 1646   LEUKOCYTESUR 3+ 07/07/2013 1646     RADIOLOGY: Dg Chest Port 1 View  Result Date: 01/14/2018 CLINICAL DATA:  Central chest pain today. EXAM: PORTABLE CHEST 1 VIEW COMPARISON:  Radiographs and CT 12/29/2017 FINDINGS: Post median sternotomy. Unchanged cardiomegaly and mediastinal contours. Improved pulmonary edema from prior exam with mild residual. Peripheral reticular opacities most prominent at the right lung base consistent with pulmonary fibrosis. No confluent consolidation. No large pleural effusion or pneumothorax. IMPRESSION: 1. Improved pulmonary edema from prior with mild residual. Stable cardiomegaly. 2. Probable pulmonary fibrosis. Electronically Signed   By: Jeb Levering M.D.   On: 01/14/2018 00:42    EKG: Orders placed or performed during the hospital encounter of 01/14/18  . ED EKG  . ED EKG    IMPRESSION AND PLAN: 1 acute chest pain Noted severe coronary artery disease status post CABG, coronary artery stenting x6 -most recent  bare-metal stent in RCA in March 2018, chronically elevated troponins, cardiomyopathy with ejection fraction 30-35%, December 2018 heart cath noted for inoperable coronary artery disease Admit to regular nursing floor bed on our ACS protocol, rule out acute coronary syndrome with cardiac enzymes x3 sets, consult cardiology for expert opinion, DAPT, Coreg, lisinopril, statin therapy-check lipids in the morning, and continue close medical monitoring Most recent echocardiogram noted for ejection fraction 30/35%  Heart cath:  Dist LM to Ost LAD lesion is 100% stenosed.  Prox Cx lesion is  100% stenosed.  Dist RCA lesion is 60% stenosed.  Prox RCA lesion is 30% stenosed.  Mid RCA lesion is 50% stenosed.  RPDA lesion is 65% stenosed.  Dist Graft to Insertion lesion is 60% stenosed.  The left ventricular  ejection fraction is 50-55% by visual estimate.  There is no aortic valve stenosis.  There is no mitral valve stenosis and no mitral valve prolapse evident.   Full metal jacket rca with non critical disease. LIMA to lad patent to small lad SVG to om1 patent Occluded lad and lcx Distal rda 70-75%. Not amenable to pci. Medical management.   2 chronic systolic heart failure/ischemic cardiomyopathy with ejection fraction 30-35% without exacerbation Stable Continue DAPT, Coreg, Lasix, lisinopril, statin therapy, potassium replacement, low-sodium/cardiac/ADA diet  3 chronically elevated troponins Stable Most likely secondary to extensive coronary artery disease, congestive heart failure, and chronic ischemic cardiomyopathy Plan of care as stated above  4 chronic multiple myeloma Stable Receives chemotherapy Will need to continue to follow-up with oncology status post discharge for continued management  5 chronic paroxysmal afib Stable  Continue home regiment   6 chronic diabetes mellitus type 2  Hold metformin for now, continue aspirin, lisinopril, sliding scale insulin with Accu-Cheks per routine and diet per above   All the records are reviewed and case discussed with ED provider. Management plans discussed with the patient, family and they are in agreement.  CODE STATUS: Code Status History    Date Active Date Inactive Code Status Order ID Comments User Context   12/30/2017 02:20 12/31/2017 17:03 Full Code [id number]  Amelia Jo, MD Inpatient   10/29/2017 02:28 10/31/2017 19:00 Full Code [id number]  Lance Coon, MD ED   09/17/2017 00:58 09/20/2017 15:24 Full Code [id number]  Lance Coon, MD Inpatient   06/10/2017 03:24 06/11/2017 16:13 Full Code [id number]  Harrie Foreman, MD Inpatient   04/29/2017 05:12 04/30/2017 14:37 Full Code [id number]  Harrie Foreman, MD Inpatient   01/27/2017 23:56 01/29/2017 14:30 Full Code [id number]  Harvie Bridge, DO Inpatient    Advance  Directive Documentation     Most Recent Value  Type of Advance Directive  Healthcare Power of Attorney  Pre-existing out of facility DNR order (yellow form or pink MOST form)  No data  "MOST" Form in Place?  No data       TOTAL TIME TAKING CARE OF THIS PATIENT: 45 minutes.    Avel Peace Sherre Wooton M.D on 01/14/2018   Between 7am to 6pm - Pager - [phone]  After 6pm go to [email or web address] - password [email or web address] [facility]  Office  [phone]  CC: Primary care physician; Kirk Ruths, MD   Note: This dictation was prepared with Dragon dictation along with smaller phrase technology. Any transcriptional errors that result from this process are unintentional.

## 2018-01-14 NOTE — Progress Notes (Signed)
Dr. Nehemiah Massed aware of troponin level. No new orders.

## 2018-01-14 NOTE — Consult Note (Signed)
[facility] Consultation Note  Patient ID: DEMICHAEL TRAUM, MRN: [id number], DOB/AGE: 12/02/1929 84 y.o. Admit date: 01/14/2018   Date of Consult: 01/14/2018 Primary Physician: Kirk Ruths, MD Primary Cardiologist: Raynelle Chary  Chief Complaint:  Chief Complaint  Patient presents with  . Chest Pain   Reason for Consult: Chest pain  HPI: 84 y.o. male with known coronary artery disease status post coronary artery bypass graft and previous multiple stents in native arteries after further significant changes in bypass grafts having admission to the hot multiple times in the last several months for chest discomfort and possible elevated troponin.  The patient had distal right coronary artery intervention for which was not ideal and appears not to have further ability for further intervention.  The patient had long discussion with primary cardiologist for possible continued medication management.  With this the patient has done relatively well until last night when he had severe pressure in his entire chest radiation into his back with shortness of breath and was seen in the emergency room with an EKG of left bundle branch block unchanged.  Troponin has increased to 0.05 unchanged from before and currently not consistent with myocardial infarction.  He has been on appropriate medication management for moderate systolic dysfunction congestive heart failure including carvedilol and lisinopril.  No current apparent treatment with isosorbide although there is concerns of apparent hypotension when patient has used isosorbide in the past.  The patient had full relief of his symptoms over a short period of time and currently is pain-free with no evidence of congestive heart failure Past Medical History:  Diagnosis Date  . Cancer ([place])    bone marrow, prostate cancer  . Diabetes mellitus without complication ([place])   . Hyperlipidemia   . MI (mitral incompetence)   . MI (myocardial  infarction) [facility])       Surgical History:  Past Surgical History:  Procedure Laterality Date  . AORTIC VALVE REPLACEMENT (AVR)/CORONARY ARTERY BYPASS GRAFTING (CABG)    . CORONARY ANGIOPLASTY WITH STENT PLACEMENT    . CORONARY BALLOON ANGIOPLASTY N/A 06/10/2017   Procedure: Coronary Balloon Angioplasty;  Surgeon: Wellington Hampshire, MD;  Location: [place] [facility];  Service: Cardiovascular;  Laterality: N/A;  . CORONARY STENT INTERVENTION N/A 01/28/2017   Procedure: Coronary Stent Intervention;  Surgeon: [name], MD;  Location: [place] [facility];  Service: Cardiovascular;  Laterality: N/A;  . CORONARY STENT INTERVENTION N/A 04/29/2017   Procedure: Coronary Stent Intervention;  Surgeon: [name], MD;  Location: [place] [facility];  Service: Cardiovascular;  Laterality: N/A;  . CORONARY STENT INTERVENTION N/A 09/19/2017   Procedure: CORONARY/GRAFT ANGIOGRAPHY;  Surgeon: Corey Skains, MD;  Location: [place] [facility];  Service: Cardiovascular;  Laterality: N/A;  . CORONARY STENT INTERVENTION N/A 09/19/2017   Procedure: CORONARY STENT INTERVENTION;  Surgeon: Wellington Hampshire, MD;  Location: [place] [facility];  Service: Cardiovascular;  Laterality: N/A;  . LEFT HEART CATH AND CORONARY ANGIOGRAPHY N/A 01/28/2017   Procedure: Left Heart Cath and Coronary Angiography;  Surgeon: [name], MD;  Location: [place] [facility];  Service: Cardiovascular;  Laterality: N/A;  . LEFT HEART CATH AND CORONARY ANGIOGRAPHY N/A 10/31/2017   Procedure: LEFT HEART CATH AND CORONARY ANGIOGRAPHY;  Surgeon: Teodoro Spray, MD;  Location: [place] [facility];  Service: Cardiovascular;  Laterality: N/A;  . LEFT HEART CATH AND CORS/GRAFTS ANGIOGRAPHY N/A 04/29/2017   Procedure: Left Heart Cath and Cors/Grafts Angiography;  Surgeon: [name],  MD;  Location: [place] [facility];  Service: Cardiovascular;  Laterality: N/A;  . LEFT HEART CATH AND CORS/GRAFTS  ANGIOGRAPHY N/A 06/10/2017   Procedure: Left Heart Cath and Cors/Grafts Angiography;  Surgeon: Corey Skains, MD;  Location: [place] [facility];  Service: Cardiovascular;  Laterality: N/A;  . LEFT HEART CATH AND CORS/GRAFTS ANGIOGRAPHY N/A 09/19/2017   Procedure: LEFT HEART CATH;  Surgeon: Corey Skains, MD;  Location: [place] [facility];  Service: Cardiovascular;  Laterality: N/A;     Home Meds: Prior to Admission medications   Medication Sig Start Date End Date Taking? Authorizing Provider  acyclovir (ZOVIRAX) 200 MG capsule Take 200 mg by mouth 2 (two) times daily.   Yes [provider]  aspirin EC 81 MG tablet Take 1 tablet (81 mg total) by mouth daily. 01/29/17  Yes Loletha Grayer, MD  carvedilol (COREG) 3.125 MG tablet Take 1 tablet (3.125 mg total) by mouth 2 (two) times daily with a meal. 12/31/17  Yes Epifanio Lesches, MD  clopidogrel (PLAVIX) 75 MG tablet Take 1 tablet (75 mg total) by mouth daily with breakfast. 04/30/17  Yes Sainani, Belia Heman, MD  dexamethasone (DECADRON) 2 MG tablet Take 8.5 mg by mouth See admin instructions. Take 8.5 mg on Thursday and Friday.   Yes [provider]  finasteride (PROSCAR) 5 MG tablet Take 2.5 mg by mouth daily.    Yes [provider]  furosemide (LASIX) 20 MG tablet Take 2 tablets (40 mg total) by mouth 2 (two) times daily. Take 40 mg bid for 5 days followed by 40 mg daily 12/31/17 12/31/18 Yes Epifanio Lesches, MD  lansoprazole (PREVACID) 30 MG capsule Take 30 mg by mouth 2 (two) times daily as needed.  11/03/17 11/03/18 Yes [provider]  lisinopril (ZESTRIL) 2.5 MG tablet Take 1 tablet (2.5 mg total) by mouth daily. 09/20/17 09/20/18 Yes Dustin Flock, MD  lovastatin (MEVACOR) 20 MG tablet Take 20 mg by mouth daily at 6 PM.   Yes [provider]  metFORMIN (GLUCOPHAGE) 500 MG tablet Take 500 mg by mouth 2 (two) times daily with a meal.   Yes [provider]  Multiple Vitamin  (MULTI-VITAMINS) TABS Take 1 tablet by mouth daily.   Yes [provider]  potassium chloride SA (K-DUR,KLOR-CON) 20 MEQ tablet Take 1 tablet (20 mEq total) by mouth daily. 09/20/17  Yes Dustin Flock, MD  silver sulfADIAZINE (SILVADENE) 1 % cream Apply to affected area daily 08/03/17 08/03/18 Yes Duanne Guess, PA-C    Inpatient Medications:  . acyclovir  200 mg Oral BID  . aspirin EC  81 mg Oral Daily  . carvedilol  3.125 mg Oral BID WC  . clopidogrel  75 mg Oral Q breakfast  . finasteride  2.5 mg Oral Daily  . furosemide  40 mg Oral BID  . heparin  5,000 Units Subcutaneous Q8H  . insulin aspart  0-15 Units Subcutaneous TID WC  . insulin aspart  0-5 Units Subcutaneous QHS  . lisinopril  2.5 mg Oral Daily  . multivitamin with minerals  1 tablet Oral Daily  . pantoprazole  40 mg Oral Daily  . potassium chloride SA  20 mEq Oral Daily  . pravastatin  40 mg Oral q1800  . silver sulfADIAZINE   Topical BID     Allergies:  Allergies  Allergen Reactions  . Antihistamines, Chlorpheniramine-Type Other (See Comments)    Reaction: unknown  . Nitroglycerin Other (See Comments)    Reaction: hypotension  Social History   Socioeconomic History  . Marital status: Married    Spouse name: Not on file  . Number of children: Not on file  . Years of education: Not on file  . Highest education level: Not on file  Social Needs  . Financial resource strain: Not on file  . Food insecurity - worry: Not on file  . Food insecurity - inability: Not on file  . Transportation needs - medical: Not on file  . Transportation needs - non-medical: Not on file  Occupational History  . Not on file  Tobacco Use  . Smoking status: Former Smoker    Last attempt to quit: 2015    Years since quitting: 4.1  . Smokeless tobacco: Former Network engineer and Sexual Activity  . Alcohol use: No  . Drug use: No  . Sexual activity: Not on file  Other Topics Concern  . Not on file  Social History  Narrative  . Not on file     Family History  Problem Relation Age of Onset  . Heart disease Other      Review of Systems Positive for chest pain pressure Negative for: General:  chills, fever, night sweats or weight changes.  Cardiovascular: PND orthopnea syncope dizziness  Dermatological skin lesions rashes Respiratory: Cough congestion Urologic: Frequent urination urination at night and hematuria Abdominal: negative for nausea, vomiting, diarrhea, bright red blood per rectum, melena, or hematemesis Neurologic: negative for visual changes, and/or hearing changes  All other systems reviewed and are otherwise negative except as noted above.  Labs: Recent Labs    01/14/18 0030  TROPONINI 0.05*   Lab Results  Component Value Date   WBC 9.7 01/14/2018   HGB 11.8 (L) 01/14/2018   HCT 34.2 (L) 01/14/2018   MCV 89.4 01/14/2018   PLT 161 01/14/2018    Recent Labs  Lab 01/14/18 0030  NA 131*  K 4.8  CL 103  CO2 19*  BUN 24*  CREATININE 1.27*  CALCIUM 9.3  PROT 7.5  BILITOT 0.7  ALKPHOS 70  ALT 26  AST 31  GLUCOSE 218*   Lab Results  Component Value Date   CHOL 110 07/08/2013   HDL 30 (L) 07/08/2013   LDLCALC 60 07/08/2013   TRIG 101 07/08/2013   No results found for: DDIMER  Radiology/Studies:  Ct Abdomen Pelvis Wo Contrast  Result Date: 12/29/2017 CLINICAL DATA:  84 y/o M; chest pain with history of bone, prostate, and blood cancer on chemotherapy. EXAM: CT CHEST, ABDOMEN AND PELVIS WITHOUT CONTRAST TECHNIQUE: Multidetector CT imaging of the chest, abdomen and pelvis was performed following the standard protocol without IV contrast. COMPARISON:  None. FINDINGS: CT CHEST FINDINGS Cardiovascular: Normal heart size. No pericardial effusion. Status post CABG with LIMA and saphenous grafts. Severe coronary artery calcification. Aortic valvular calcification associated with aortic stenosis. Normal caliber thoracic aorta and main pulmonary artery. Calcific  atherosclerosis of aorta. Mediastinum/Nodes: No enlarged mediastinal, hilar, or axillary lymph nodes. Thyroid gland, trachea, and esophagus demonstrate no significant findings. Lungs/Pleura: Smooth interlobular septal thickening and mild peribronchial thickening compatible with interstitial pulmonary edema. No alveolar pulmonary edema. No pleural effusion. No pneumothorax. Coarse reticulation of the lung peripheries greatest at lung bases, probably a component of mild pulmonary fibrosis. Musculoskeletal: Healed median sternotomy. Multiple chronic rib fractures bilaterally. No acute fracture or destructive bony lesion identified. CT ABDOMEN PELVIS FINDINGS Hepatobiliary: Liver segment 2 subcentimeter cyst. Otherwise no focal liver lesion identified. Cholelithiasis. No biliary ductal dilatation. Pancreas: Unremarkable. No  pancreatic ductal dilatation or surrounding inflammatory changes. Spleen: Normal in size without focal abnormality. Adrenals/Urinary Tract: Adrenal glands are unremarkable. Kidneys are normal, without renal calculi, focal lesion, or hydronephrosis. Bladder is unremarkable. Stomach/Bowel: Stomach is within normal limits. Appendix appears normal. No evidence of bowel wall thickening, distention, or inflammatory changes. Mild sigmoid diverticulosis. Vascular/Lymphatic: Aortic atherosclerosis. No enlarged abdominal or pelvic lymph nodes. Reproductive: Mild prostate enlargement. Other: No abdominal wall hernia or abnormality. No abdominopelvic ascites. Musculoskeletal: No acute fracture or destructive bony lesion identified. Mild multilevel discogenic degenerative changes of the thoracolumbar spine and moderate lower lumbar facet arthrosis. No high-grade bony canal stenosis. IMPRESSION: 1. Interstitial pulmonary edema. 2. Probable superimposed mild pulmonary fibrosis. 3. Severe coronary artery calcification.  Status post CABG. 4. Aortic valvular calcification associated with aortic stenosis. 5. Aortic  atherosclerosis with extensive infrarenal calcification. 6. Cholelithiasis. 7. Sigmoid diverticulosis. Electronically Signed   By: Kristine Garbe M.D.   On: 12/29/2017 22:49   Dg Chest 2 View  Result Date: 12/29/2017 CLINICAL DATA:  Chest pain. History of MI, status post open heart surgery in 1989. EXAM: CHEST  2 VIEW COMPARISON:  Chest x-rays dated 10/28/2017 and 01/27/2017. FINDINGS: Heart size upper normal, stable. Overall cardiomediastinal silhouette is stable in size and configuration. Median sternotomy wires appear intact and stable in alignment. Coarse lung markings noted bilaterally, increased compared to previous study, most suggestive of interstitial edema superimposed on chronic interstitial lung disease/fibrosis. Lucency is seen along the medial aspects of the left hemidiaphragm, suspicious for free intraperitoneal air. IMPRESSION: 1. Lucency along the medial aspect of the left hemidiaphragm, of uncertain etiology, possibly free intraperitoneal air within the upper abdomen. Recommend CT abdomen for further characterization. 2. Increasingly coarse lung markings bilaterally, most likely interstitial edema superimposed on worsening chronic interstitial lung disease/fibrosis. These results were called by telephone at the time of interpretation on 12/29/2017 at 9:53 pm to Dr. Rudene Re , who verbally acknowledged these results. Electronically Signed   By: Franki Cabot M.D.   On: 12/29/2017 21:54   Ct Chest Wo Contrast  Result Date: 12/29/2017 CLINICAL DATA:  84 y/o M; chest pain with history of bone, prostate, and blood cancer on chemotherapy. EXAM: CT CHEST, ABDOMEN AND PELVIS WITHOUT CONTRAST TECHNIQUE: Multidetector CT imaging of the chest, abdomen and pelvis was performed following the standard protocol without IV contrast. COMPARISON:  None. FINDINGS: CT CHEST FINDINGS Cardiovascular: Normal heart size. No pericardial effusion. Status post CABG with LIMA and saphenous grafts.  Severe coronary artery calcification. Aortic valvular calcification associated with aortic stenosis. Normal caliber thoracic aorta and main pulmonary artery. Calcific atherosclerosis of aorta. Mediastinum/Nodes: No enlarged mediastinal, hilar, or axillary lymph nodes. Thyroid gland, trachea, and esophagus demonstrate no significant findings. Lungs/Pleura: Smooth interlobular septal thickening and mild peribronchial thickening compatible with interstitial pulmonary edema. No alveolar pulmonary edema. No pleural effusion. No pneumothorax. Coarse reticulation of the lung peripheries greatest at lung bases, probably a component of mild pulmonary fibrosis. Musculoskeletal: Healed median sternotomy. Multiple chronic rib fractures bilaterally. No acute fracture or destructive bony lesion identified. CT ABDOMEN PELVIS FINDINGS Hepatobiliary: Liver segment 2 subcentimeter cyst. Otherwise no focal liver lesion identified. Cholelithiasis. No biliary ductal dilatation. Pancreas: Unremarkable. No pancreatic ductal dilatation or surrounding inflammatory changes. Spleen: Normal in size without focal abnormality. Adrenals/Urinary Tract: Adrenal glands are unremarkable. Kidneys are normal, without renal calculi, focal lesion, or hydronephrosis. Bladder is unremarkable. Stomach/Bowel: Stomach is within normal limits. Appendix appears normal. No evidence of bowel wall thickening, distention, or inflammatory changes. Mild  sigmoid diverticulosis. Vascular/Lymphatic: Aortic atherosclerosis. No enlarged abdominal or pelvic lymph nodes. Reproductive: Mild prostate enlargement. Other: No abdominal wall hernia or abnormality. No abdominopelvic ascites. Musculoskeletal: No acute fracture or destructive bony lesion identified. Mild multilevel discogenic degenerative changes of the thoracolumbar spine and moderate lower lumbar facet arthrosis. No high-grade bony canal stenosis. IMPRESSION: 1. Interstitial pulmonary edema. 2. Probable  superimposed mild pulmonary fibrosis. 3. Severe coronary artery calcification.  Status post CABG. 4. Aortic valvular calcification associated with aortic stenosis. 5. Aortic atherosclerosis with extensive infrarenal calcification. 6. Cholelithiasis. 7. Sigmoid diverticulosis. Electronically Signed   By: Kristine Garbe M.D.   On: 12/29/2017 22:49   Dg Chest Port 1 View  Result Date: 01/14/2018 CLINICAL DATA:  Central chest pain today. EXAM: PORTABLE CHEST 1 VIEW COMPARISON:  Radiographs and CT 12/29/2017 FINDINGS: Post median sternotomy. Unchanged cardiomegaly and mediastinal contours. Improved pulmonary edema from prior exam with mild residual. Peripheral reticular opacities most prominent at the right lung base consistent with pulmonary fibrosis. No confluent consolidation. No large pleural effusion or pneumothorax. IMPRESSION: 1. Improved pulmonary edema from prior with mild residual. Stable cardiomegaly. 2. Probable pulmonary fibrosis. Electronically Signed   By: Jeb Levering M.D.   On: 01/14/2018 00:42    EKG: Normal sinus rhythm with left bundle branch block  Weights: Filed Weights   01/14/18 0020 01/14/18 0326  Weight: 147 lb (66.7 kg) 150 lb 1.6 oz (68.1 kg)     Physical Exam: Blood pressure (!) 141/66, pulse 75, temperature (!) 97.5 F (36.4 C), temperature source Oral, resp. rate 18, height 5\' 7"  (1.702 m), weight 150 lb 1.6 oz (68.1 kg), SpO2 98 %. Body mass index is 23.51 kg/m. General: Well developed, well nourished, in no acute distress. Head eyes ears nose throat: Normocephalic, atraumatic, sclera non-icteric, no xanthomas, nares are without discharge. No apparent thyromegaly and/or mass  Lungs: Normal respiratory effort.  no wheezes, few rales, no rhonchi.  Heart: RRR with normal S1 S2. no murmur gallop, no rub, PMI is normal size and placement, carotid upstroke normal without bruit, jugular venous pressure is normal Abdomen: Soft, non-tender, non-distended with  normoactive bowel sounds. No hepatomegaly. No rebound/guarding. No obvious abdominal masses. Abdominal aorta is normal size without bruit Extremities: No edema. no cyanosis, no clubbing, no ulcers  Peripheral : 2+ bilateral upper extremity pulses, 2+ bilateral femoral pulses, 2+ bilateral dorsal pedal pulse Neuro: Alert and oriented. No facial asymmetry. No focal deficit. Moves all extremities spontaneously. Musculoskeletal: Normal muscle tone without kyphosis Psych:  Responds to questions appropriately with a normal affect.    Assessment: 84 year old male with known coronary artery disease status post coronary bypass graft multiple stents and previous cardiomyopathy with sleep apnea diabetes and hypertension having chest pain and pressure without current evidence of myocardial infarction  Plan: 1.  Continue surveillance of myocardial infarction 2.  Continue carvedilol and lisinopril for hypertension control and potentially increase lisinopril as able for better hypertension control and cardiomyopathy treatment 3.  Avoid isosorbide at this time due to apparent hypotension and side effects in the past 4.  Furosemide for chronic systolic dysfunction congestive heart failure 5.  No further cardiac diagnostics necessary at this time 6.  Begin ambulation and follow for any further significant chest discomfort with consideration of other medication management if chest discomfort or concerns with possible calcium channel blocker and/or Ranexa if able 7.  Further diagnostic testing and treatment based on above  Signed, Corey Skains M.D. [facility] 01/14/2018, 6:43 AM

## 2018-01-16 ENCOUNTER — Inpatient Hospital Stay
Admission: EM | Admit: 2018-01-16 | Discharge: 2018-01-18 | DRG: 683 | Disposition: A | Discharge status: Home Health | Payer: Medicare HMO | Attending: Internal Medicine | Admitting: Internal Medicine

## 2018-01-16 ENCOUNTER — Other Ambulatory Visit: Payer: Self-pay

## 2018-01-16 ENCOUNTER — Inpatient Hospital Stay: Payer: Medicare HMO

## 2018-01-16 DIAGNOSIS — Z951 Presence of aortocoronary bypass graft: Secondary | ICD-10-CM | POA: Diagnosis not present

## 2018-01-16 DIAGNOSIS — I480 Paroxysmal atrial fibrillation: Secondary | ICD-10-CM | POA: Diagnosis present

## 2018-01-16 DIAGNOSIS — C9000 Multiple myeloma not having achieved remission: Secondary | ICD-10-CM | POA: Diagnosis present

## 2018-01-16 DIAGNOSIS — I951 Orthostatic hypotension: Secondary | ICD-10-CM | POA: Diagnosis present

## 2018-01-16 DIAGNOSIS — T502X5A Adverse effect of carbonic-anhydrase inhibitors, benzothiadiazides and other diuretics, initial encounter: Secondary | ICD-10-CM | POA: Diagnosis present

## 2018-01-16 DIAGNOSIS — I5022 Chronic systolic (congestive) heart failure: Secondary | ICD-10-CM | POA: Diagnosis present

## 2018-01-16 DIAGNOSIS — M79604 Pain in right leg: Secondary | ICD-10-CM | POA: Diagnosis present

## 2018-01-16 DIAGNOSIS — N179 Acute kidney failure, unspecified: Secondary | ICD-10-CM | POA: Diagnosis present

## 2018-01-16 DIAGNOSIS — I447 Left bundle-branch block, unspecified: Secondary | ICD-10-CM | POA: Diagnosis present

## 2018-01-16 DIAGNOSIS — Z7982 Long term (current) use of aspirin: Secondary | ICD-10-CM

## 2018-01-16 DIAGNOSIS — N170 Acute kidney failure with tubular necrosis: Principal | ICD-10-CM | POA: Diagnosis present

## 2018-01-16 DIAGNOSIS — Z7902 Long term (current) use of antithrombotics/antiplatelets: Secondary | ICD-10-CM

## 2018-01-16 DIAGNOSIS — Z79899 Other long term (current) drug therapy: Secondary | ICD-10-CM

## 2018-01-16 DIAGNOSIS — Z955 Presence of coronary angioplasty implant and graft: Secondary | ICD-10-CM | POA: Diagnosis not present

## 2018-01-16 DIAGNOSIS — E860 Dehydration: Secondary | ICD-10-CM | POA: Diagnosis present

## 2018-01-16 DIAGNOSIS — I110 Hypertensive heart disease with heart failure: Secondary | ICD-10-CM | POA: Diagnosis present

## 2018-01-16 DIAGNOSIS — E785 Hyperlipidemia, unspecified: Secondary | ICD-10-CM | POA: Diagnosis present

## 2018-01-16 DIAGNOSIS — E119 Type 2 diabetes mellitus without complications: Secondary | ICD-10-CM | POA: Diagnosis present

## 2018-01-16 DIAGNOSIS — R55 Syncope and collapse: Secondary | ICD-10-CM

## 2018-01-16 DIAGNOSIS — R52 Pain, unspecified: Secondary | ICD-10-CM

## 2018-01-16 DIAGNOSIS — Z87891 Personal history of nicotine dependence: Secondary | ICD-10-CM | POA: Diagnosis not present

## 2018-01-16 DIAGNOSIS — I252 Old myocardial infarction: Secondary | ICD-10-CM | POA: Diagnosis not present

## 2018-01-16 DIAGNOSIS — Z7984 Long term (current) use of oral hypoglycemic drugs: Secondary | ICD-10-CM

## 2018-01-16 DIAGNOSIS — Y92230 Patient room in hospital as the place of occurrence of the external cause: Secondary | ICD-10-CM | POA: Diagnosis present

## 2018-01-16 DIAGNOSIS — W19XXXA Unspecified fall, initial encounter: Secondary | ICD-10-CM | POA: Diagnosis present

## 2018-01-16 DIAGNOSIS — I2510 Atherosclerotic heart disease of native coronary artery without angina pectoris: Secondary | ICD-10-CM | POA: Diagnosis present

## 2018-01-16 DIAGNOSIS — Z888 Allergy status to other drugs, medicaments and biological substances status: Secondary | ICD-10-CM

## 2018-01-16 DIAGNOSIS — Z952 Presence of prosthetic heart valve: Secondary | ICD-10-CM | POA: Diagnosis not present

## 2018-01-16 DIAGNOSIS — Z8546 Personal history of malignant neoplasm of prostate: Secondary | ICD-10-CM | POA: Diagnosis not present

## 2018-01-16 LAB — COMPREHENSIVE METABOLIC PANEL
ALT: 25 U/L (ref 17–63)
AST: 32 U/L (ref 15–41)
Albumin: 3.5 g/dL (ref 3.5–5.0)
Alkaline Phosphatase: 62 U/L (ref 38–126)
Anion gap: 9 (ref 5–15)
BUN: 32 mg/dL — ABNORMAL HIGH (ref 6–20)
CHLORIDE: 102 mmol/L (ref 101–111)
CO2: 20 mmol/L — AB (ref 22–32)
CREATININE: 1.74 mg/dL — AB (ref 0.61–1.24)
Calcium: 9.7 mg/dL (ref 8.9–10.3)
GFR calc Af Amer: 39 mL/min — ABNORMAL LOW (ref >60)
GFR, EST NON AFRICAN AMERICAN: 34 mL/min — AB (ref >60)
Glucose, Bld: 134 mg/dL — ABNORMAL HIGH (ref 65–99)
POTASSIUM: 4.1 mmol/L (ref 3.5–5.1)
SODIUM: 131 mmol/L — AB (ref 135–145)
Total Bilirubin: 1.2 mg/dL (ref 0.3–1.2)
Total Protein: 7 g/dL (ref 6.5–8.1)

## 2018-01-16 LAB — TROPONIN I: TROPONIN I: 0.07 ng/mL — AB (ref <0.03)

## 2018-01-16 LAB — CBC
HCT: 32.5 % — ABNORMAL LOW (ref 40.0–52.0)
HEMOGLOBIN: 11 g/dL — AB (ref 13.0–18.0)
MCH: 30.2 pg (ref 26.0–34.0)
MCHC: 33.7 g/dL (ref 32.0–36.0)
MCV: 89.5 fL (ref 80.0–100.0)
PLATELETS: 117 10*3/uL — AB (ref 150–440)
RBC: 3.63 MIL/uL — ABNORMAL LOW (ref 4.40–5.90)
RDW: 17.2 % — ABNORMAL HIGH (ref 11.5–14.5)
WBC: 11.2 10*3/uL — ABNORMAL HIGH (ref 3.8–10.6)

## 2018-01-16 LAB — GLUCOSE, CAPILLARY
GLUCOSE-CAPILLARY: 100 mg/dL — AB (ref 65–99)
GLUCOSE-CAPILLARY: 110 mg/dL — AB (ref 65–99)

## 2018-01-16 MED ORDER — ACETAMINOPHEN 325 MG PO TABS: 650.0000 mg | ORAL_TABLET | Freq: Four times a day (QID) | ORAL | Status: DC | PRN

## 2018-01-16 MED ORDER — DEXAMETHASONE 0.5 MG PO TABS: 8.5000 mg | ORAL_TABLET | ORAL | Status: DC

## 2018-01-16 MED ORDER — ACETAMINOPHEN 650 MG RE SUPP: 650.0000 mg | Freq: Four times a day (QID) | RECTAL | Status: DC | PRN

## 2018-01-16 MED ORDER — CARVEDILOL 6.25 MG PO TABS
3.1250 mg | ORAL_TABLET | Freq: Two times a day (BID) | ORAL | Status: DC
  Administered 2018-01-17 – 2018-01-18 (×3): 3.125 mg via ORAL
  Filled 2018-01-16 (×4): qty 1

## 2018-01-16 MED ORDER — ALPRAZOLAM 0.25 MG PO TABS
0.2500 mg | ORAL_TABLET | Freq: Two times a day (BID) | ORAL | Status: DC | PRN
  Administered 2018-01-18: 0.25 mg via ORAL
  Filled 2018-01-16: qty 1

## 2018-01-16 MED ORDER — ADULT MULTIVITAMIN W/MINERALS CH
1.0000 | ORAL_TABLET | Freq: Every day | ORAL | Status: DC
  Administered 2018-01-16 – 2018-01-18 (×4): 1 via ORAL
  Filled 2018-01-16 (×3): qty 1

## 2018-01-16 MED ORDER — POTASSIUM CHLORIDE CRYS ER 20 MEQ PO TBCR
20.0000 meq | EXTENDED_RELEASE_TABLET | Freq: Every day | ORAL | Status: DC
  Administered 2018-01-17 – 2018-01-18 (×2): 20 meq via ORAL
  Filled 2018-01-16 (×2): qty 1

## 2018-01-16 MED ORDER — ENOXAPARIN SODIUM 40 MG/0.4ML ~~LOC~~ SOLN
30.0000 mg | SUBCUTANEOUS | Status: DC
  Administered 2018-01-16: 30 mg via SUBCUTANEOUS
  Filled 2018-01-16: qty 0.4

## 2018-01-16 MED ORDER — PRAVASTATIN SODIUM 10 MG PO TABS
10.0000 mg | ORAL_TABLET | Freq: Every day | ORAL | Status: DC
  Administered 2018-01-16 – 2018-01-17 (×2): 10 mg via ORAL
  Filled 2018-01-16 (×3): qty 1

## 2018-01-16 MED ORDER — SODIUM CHLORIDE 0.9 % IV BOLUS (SEPSIS)
500.0000 mL | Freq: Once | INTRAVENOUS | Status: AC
  Administered 2018-01-16: 500 mL via INTRAVENOUS

## 2018-01-16 MED ORDER — PANTOPRAZOLE SODIUM 40 MG PO TBEC
40.0000 mg | DELAYED_RELEASE_TABLET | Freq: Every day | ORAL | Status: DC
Start: 2018-01-17 — End: 2018-01-18
  Administered 2018-01-17 – 2018-01-18 (×2): 40 mg via ORAL
  Filled 2018-01-16 (×2): qty 1

## 2018-01-16 MED ORDER — ENOXAPARIN SODIUM 40 MG/0.4ML ~~LOC~~ SOLN: 40.0000 mg | SUBCUTANEOUS | Status: DC

## 2018-01-16 MED ORDER — SODIUM CHLORIDE 0.9 % IV SOLN
INTRAVENOUS | Status: DC
  Administered 2018-01-16: 16:00:00 via INTRAVENOUS

## 2018-01-16 MED ORDER — INSULIN ASPART 100 UNIT/ML ~~LOC~~ SOLN
0.0000 [IU] | Freq: Three times a day (TID) | SUBCUTANEOUS | Status: DC
Start: 2018-01-16 — End: 2018-01-18
  Administered 2018-01-17 – 2018-01-18 (×2): 1 [IU] via SUBCUTANEOUS
  Filled 2018-01-16 (×2): qty 1

## 2018-01-16 MED ORDER — ONDANSETRON HCL 4 MG/2ML IJ SOLN: 4.0000 mg | Freq: Four times a day (QID) | INTRAMUSCULAR | Status: DC | PRN

## 2018-01-16 MED ORDER — CLOPIDOGREL BISULFATE 75 MG PO TABS
75.0000 mg | ORAL_TABLET | Freq: Every day | ORAL | Status: DC
  Administered 2018-01-17 – 2018-01-18 (×2): 75 mg via ORAL
  Filled 2018-01-16 (×2): qty 1

## 2018-01-16 MED ORDER — BISACODYL 5 MG PO TBEC
5.0000 mg | DELAYED_RELEASE_TABLET | Freq: Every day | ORAL | Status: DC | PRN
  Administered 2018-01-17 – 2018-01-18 (×2): 5 mg via ORAL
  Filled 2018-01-16 (×2): qty 1

## 2018-01-16 MED ORDER — FINASTERIDE 5 MG PO TABS
2.5000 mg | ORAL_TABLET | Freq: Every day | ORAL | Status: DC
  Administered 2018-01-16 – 2018-01-18 (×3): 2.5 mg via ORAL
  Filled 2018-01-16 (×2): qty 1
  Filled 2018-01-16 (×3): qty 0.5

## 2018-01-16 MED ORDER — ASPIRIN EC 81 MG PO TBEC
81.0000 mg | DELAYED_RELEASE_TABLET | Freq: Every day | ORAL | Status: DC
  Administered 2018-01-16 – 2018-01-18 (×3): 81 mg via ORAL
  Filled 2018-01-16 (×3): qty 1

## 2018-01-16 MED ORDER — ACYCLOVIR 200 MG PO CAPS
200.0000 mg | ORAL_CAPSULE | Freq: Two times a day (BID) | ORAL | Status: DC
  Administered 2018-01-16 – 2018-01-18 (×5): 200 mg via ORAL
  Filled 2018-01-16 (×6): qty 1

## 2018-01-16 MED ORDER — ONDANSETRON HCL 4 MG PO TABS: 4.0000 mg | ORAL_TABLET | Freq: Four times a day (QID) | ORAL | Status: DC | PRN

## 2018-01-16 NOTE — Progress Notes (Signed)
Pharmacy Lovenox Dosing  84 y.o. male admitted with Loss of Consciousness . Patient ordered Lovenox 40 mg daily for VTE prophylaxis.   Filed Weights   01/16/18 0936  Weight: 150 lb (68 kg)    Body mass index is 23.49 kg/m.  Estimated Creatinine Clearance: 29 mL/min (A) (by C-G formula based on SCr of 1.74 mg/dL (H)).  Will adjust Lovenox dosing to 30 mg daily for renal insufficiency.   Ulice Dash D 01/16/2018 3:09 PM

## 2018-01-16 NOTE — ED Notes (Signed)
Date and time results received: 01/16/18 1024 (use smartphrase ".now" to insert current time)  Test: Troponin Critical Value: 0.07  Name of Provider Notified: Lenise Herald  Orders Received? Or Actions Taken?: no new orders at this time, EDP aware

## 2018-01-16 NOTE — ED Provider Notes (Signed)
[facility] Emergency Department Provider Note       Time seen: ----------------------------------------- 9:26 AM on 01/16/2018 -----------------------------------------   I have reviewed the triage vital signs and the nursing notes.  HISTORY   Chief Complaint No chief complaint on file.    HPI Dwayne Terry is a 84 y.o. male with a history of bone marrow cancer, prostate cancer, diabetes, hyperlipidemia, MI who presents to the ED for dizziness.  Patient arrives by EMS from home.  He had a syncopal event when he got up to go to the bathroom.  He is not complaining of pain from the fall.  Currently he is receiving weekly chemotherapy treatments at [facility] for multiple myeloma and has had poor appetite.  He is also been placed on higher doses of Lasix recently.  He was admitted in the hospital for chest pain.  Past Medical History:  Diagnosis Date  . Cancer ([place])    bone marrow, prostate cancer  . Diabetes mellitus without complication ([place])   . Hyperlipidemia   . MI (mitral incompetence)   . MI (myocardial infarction) [facility])     Patient Active Problem List   Diagnosis Date Noted  . Pulmonary edema 12/30/2017  . HLD (hyperlipidemia) 09/16/2017  . Diabetes ([place]) 09/16/2017  . Chest pain 04/29/2017  . NSTEMI (non-ST elevated myocardial infarction) ([place]) 04/29/2017  . Presence of coronary angioplasty implant and graft 03/21/2017  . Chest pain, rule out acute myocardial infarction 01/27/2017  . Dyspnea on exertion 09/06/2014  . Malignant neoplasm of prostate ([place]) 05/09/2014  . Atherosclerotic heart disease of native coronary artery without angina pectoris 07/10/2013  . Essential (primary) hypertension 07/10/2013    Past Surgical History:  Procedure Laterality Date  . AORTIC VALVE REPLACEMENT (AVR)/CORONARY ARTERY BYPASS GRAFTING (CABG)    . CORONARY ANGIOPLASTY WITH STENT PLACEMENT    . CORONARY BALLOON ANGIOPLASTY N/A 06/10/2017   Procedure:  Coronary Balloon Angioplasty;  Surgeon: Wellington Hampshire, MD;  Location: [place] CV LAB;  Service: Cardiovascular;  Laterality: N/A;  . CORONARY STENT INTERVENTION N/A 01/28/2017   Procedure: Coronary Stent Intervention;  Surgeon: [name], MD;  Location: [place] CV LAB;  Service: Cardiovascular;  Laterality: N/A;  . CORONARY STENT INTERVENTION N/A 04/29/2017   Procedure: Coronary Stent Intervention;  Surgeon: [name], MD;  Location: [place] CV LAB;  Service: Cardiovascular;  Laterality: N/A;  . CORONARY STENT INTERVENTION N/A 09/19/2017   Procedure: CORONARY/GRAFT ANGIOGRAPHY;  Surgeon: Corey Skains, MD;  Location: [place] CV LAB;  Service: Cardiovascular;  Laterality: N/A;  . CORONARY STENT INTERVENTION N/A 09/19/2017   Procedure: CORONARY STENT INTERVENTION;  Surgeon: Wellington Hampshire, MD;  Location: [place] CV LAB;  Service: Cardiovascular;  Laterality: N/A;  . LEFT HEART CATH AND CORONARY ANGIOGRAPHY N/A 01/28/2017   Procedure: Left Heart Cath and Coronary Angiography;  Surgeon: [name], MD;  Location: [place] CV LAB;  Service: Cardiovascular;  Laterality: N/A;  . LEFT HEART CATH AND CORONARY ANGIOGRAPHY N/A 10/31/2017   Procedure: LEFT HEART CATH AND CORONARY ANGIOGRAPHY;  Surgeon: Teodoro Spray, MD;  Location: [place] CV LAB;  Service: Cardiovascular;  Laterality: N/A;  . LEFT HEART CATH AND CORS/GRAFTS ANGIOGRAPHY N/A 04/29/2017   Procedure: Left Heart Cath and Cors/Grafts Angiography;  Surgeon: [name], MD;  Location: [place] CV LAB;  Service: Cardiovascular;  Laterality: N/A;  . LEFT HEART CATH AND CORS/GRAFTS ANGIOGRAPHY N/A 06/10/2017   Procedure: Left Heart Cath and Cors/Grafts Angiography;  Surgeon:  Corey Skains, MD;  Location: [place] CV LAB;  Service: Cardiovascular;  Laterality: N/A;  . LEFT HEART CATH AND CORS/GRAFTS ANGIOGRAPHY N/A 09/19/2017   Procedure: LEFT HEART CATH;   Surgeon: Corey Skains, MD;  Location: [place] CV LAB;  Service: Cardiovascular;  Laterality: N/A;    Allergies Antihistamines, chlorpheniramine-type and Nitroglycerin  Social History Social History   Tobacco Use  . Smoking status: Former Smoker    Last attempt to quit: 2015    Years since quitting: 4.1  . Smokeless tobacco: Former Network engineer Use Topics  . Alcohol use: No  . Drug use: No    Review of Systems Constitutional: Negative for fever. Cardiovascular: Negative for chest pain. Respiratory: Negative for shortness of breath. Gastrointestinal: Negative for abdominal pain, vomiting and diarrhea. Musculoskeletal: Negative for back pain. Skin: Negative for rash. Neurological: Negative for headaches, positive for weakness  All systems negative/normal/unremarkable except as stated in the HPI  ____________________________________________   PHYSICAL EXAM:  VITAL SIGNS: ED Triage Vitals  Enc Vitals Group     BP      Pulse      Resp      Temp      Temp src      SpO2      Weight      Height      Head Circumference      Peak Flow      Pain Score      Pain Loc      Pain Edu?      Excl. in [place]?     Constitutional: Alert and oriented. Well appearing and in no distress. Eyes: Conjunctivae are normal. Normal extraocular movements. ENT   Head: Normocephalic and atraumatic.   Nose: No congestion/rhinnorhea.   Mouth/Throat: Mucous membranes are moist.   Neck: No stridor. Cardiovascular: Normal rate, regular rhythm. No murmurs, rubs, or gallops. Respiratory: Normal respiratory effort without tachypnea nor retractions. Breath sounds are clear and equal bilaterally. No wheezes/rales/rhonchi. Gastrointestinal: Soft and nontender. Normal bowel sounds Musculoskeletal: Nontender with normal range of motion in extremities. No lower extremity tenderness nor edema. Neurologic:  Normal speech and language. No gross focal neurologic deficits are  appreciated.  Skin:  Skin is warm, dry and intact. No rash noted. Psychiatric: Mood and affect are normal. Speech and behavior are normal.  ____________________________________________  EKG: Interpreted by me.  Sinus rhythm the rate is 74 bpm, prolonged PR interval, wide QRS, left bundle branch block, long QT, normal axis  ____________________________________________  ED COURSE:  As part of my medical decision making, I reviewed the following data within the [place] History obtained from family if available, nursing notes, old chart and ekg, as well as notes from prior ED visits. Patient presented for syncope and likely dehydration, we will assess with labs and imaging as indicated at this time.   Procedures ____________________________________________   LABS (pertinent positives/negatives)  Labs Reviewed  CBC - Abnormal; Notable for the following components:      Result Value   WBC 11.2 (*)    RBC 3.63 (*)    Hemoglobin 11.0 (*)    HCT 32.5 (*)    RDW 17.2 (*)    Platelets 117 (*)    All other components within normal limits  TROPONIN I - Abnormal; Notable for the following components:   Troponin I 0.07 (*)    All other components within normal limits  COMPREHENSIVE METABOLIC PANEL - Abnormal; Notable for the following  components:   Sodium 131 (*)    CO2 20 (*)    Glucose, Bld 134 (*)    BUN 32 (*)    Creatinine, Ser 1.74 (*)    GFR calc non Af Amer 34 (*)    GFR calc Af Amer 39 (*)    All other components within normal limits   ____________________________________________  DIFFERENTIAL DIAGNOSIS   Dehydration, electrolyte abnormality, renal failure, MI, anemia  FINAL ASSESSMENT AND PLAN  Syncope, acute on chronic renal insufficiency   Plan: Patient had presented for be likely due to dehydration. Patient's labs did reveal worsening renal insufficiency likely from dehydration.  We gave him half liter IV fluid bolus with only marginal  improvement.  I will continue a slow IV infusion and discussed with the hospitalist for admission.   Laurence Aly, MD   Note: This note was generated in part or whole with voice recognition software. Voice recognition is usually quite accurate but there are transcription errors that can and very often do occur. I apologize for any typographical errors that were not detected and corrected.     Earleen Newport, MD 01/16/18 1255

## 2018-01-16 NOTE — H&P (Signed)
[place] [facility] [place] NAME: Dwayne Terry    MR#:  [id number]  DATE OF BIRTH:  1930-03-16  DATE OF ADMISSION:  01/16/2018  PRIMARY CARE PHYSICIAN: Kirk Ruths, MD   REQUESTING/REFERRING PHYSICIAN: Lenise Arena  CHIEF COMPLAINT: Dizziness, near syncope   Chief Complaint  Patient presents with  . Loss of Consciousness    HISTORY OF PRESENT ILLNESS:  Dwayne Terry  is a 84 y.o. male with a known history of multiple myeloma, prostate cancer, diabetes mellitus type 2, hyperlipidemia presents to ED with dizziness.  Patient had couple of things syncopal events at home.  Discharge from hospital 2 days ago with higher dose of Lasix.  Blood pressure was low here 97/48.  Patient has no headache.  No other complaints like shortness of breath.  Where patient had a fall and now complains of right leg pain.  PAST MEDICAL HISTORY:   Past Medical History:  Diagnosis Date  . Cancer ([place])    bone marrow, prostate cancer  . Diabetes mellitus without complication ([place])   . Hyperlipidemia   . MI (mitral incompetence)   . MI (myocardial infarction) ([place])     PAST SURGICAL HISTOIRY:   Past Surgical History:  Procedure Laterality Date  . AORTIC VALVE REPLACEMENT (AVR)/CORONARY ARTERY BYPASS GRAFTING (CABG)    . CORONARY ANGIOPLASTY WITH STENT PLACEMENT    . CORONARY BALLOON ANGIOPLASTY N/A 06/10/2017   Procedure: Coronary Balloon Angioplasty;  Surgeon: Wellington Hampshire, MD;  Location: [place] CV LAB;  Service: Cardiovascular;  Laterality: N/A;  . CORONARY STENT INTERVENTION N/A 01/28/2017   Procedure: Coronary Stent Intervention;  Surgeon: [name], MD;  Location: [place] CV LAB;  Service: Cardiovascular;  Laterality: N/A;  . CORONARY STENT INTERVENTION N/A 04/29/2017   Procedure: Coronary Stent Intervention;  Surgeon: [name], MD;  Location: [place] CV LAB;  Service: Cardiovascular;  Laterality:  N/A;  . CORONARY STENT INTERVENTION N/A 09/19/2017   Procedure: CORONARY/GRAFT ANGIOGRAPHY;  Surgeon: Corey Skains, MD;  Location: [place] CV LAB;  Service: Cardiovascular;  Laterality: N/A;  . CORONARY STENT INTERVENTION N/A 09/19/2017   Procedure: CORONARY STENT INTERVENTION;  Surgeon: Wellington Hampshire, MD;  Location: [place] CV LAB;  Service: Cardiovascular;  Laterality: N/A;  . LEFT HEART CATH AND CORONARY ANGIOGRAPHY N/A 01/28/2017   Procedure: Left Heart Cath and Coronary Angiography;  Surgeon: [name], MD;  Location: [place] CV LAB;  Service: Cardiovascular;  Laterality: N/A;  . LEFT HEART CATH AND CORONARY ANGIOGRAPHY N/A 10/31/2017   Procedure: LEFT HEART CATH AND CORONARY ANGIOGRAPHY;  Surgeon: Teodoro Spray, MD;  Location: [place] CV LAB;  Service: Cardiovascular;  Laterality: N/A;  . LEFT HEART CATH AND CORS/GRAFTS ANGIOGRAPHY N/A 04/29/2017   Procedure: Left Heart Cath and Cors/Grafts Angiography;  Surgeon: [name], MD;  Location: [place] CV LAB;  Service: Cardiovascular;  Laterality: N/A;  . LEFT HEART CATH AND CORS/GRAFTS ANGIOGRAPHY N/A 06/10/2017   Procedure: Left Heart Cath and Cors/Grafts Angiography;  Surgeon: Corey Skains, MD;  Location: [place] CV LAB;  Service: Cardiovascular;  Laterality: N/A;  . LEFT HEART CATH AND CORS/GRAFTS ANGIOGRAPHY N/A 09/19/2017   Procedure: LEFT HEART CATH;  Surgeon: Corey Skains, MD;  Location: [place] CV LAB;  Service: Cardiovascular;  Laterality: N/A;    SOCIAL HISTORY:   Social History   Tobacco Use  . Smoking status: Former Smoker    Last attempt to quit: 2015  Years since quitting: 4.1  . Smokeless tobacco: Former Network engineer Use Topics  . Alcohol use: No    FAMILY HISTORY:   Family History  Problem Relation Age of Onset  . Heart disease Other     DRUG ALLERGIES:   Allergies  Allergen Reactions  . Antihistamines, Chlorpheniramine-Type  Other (See Comments)    Reaction: unknown  . Nitroglycerin Other (See Comments)    Reaction: hypotension    REVIEW OF SYSTEMS:  CONSTITUTIONAL: No fever, dizziness today. EYES: No blurred or double vision.  EARS, NOSE, AND THROAT: No tinnitus or ear pain.  RESPIRATORY: No cough, shortness of breath, wheezing or hemoptysis.  CARDIOVASCULAR: No chest pain, orthopnea, edema.  GASTROINTESTINAL: No nausea, vomiting, diarrhea or abdominal pain.  GENITOURINARY: No dysuria, hematuria.  ENDOCRINE: No polyuria, nocturia,  HEMATOLOGY: No anemia, easy bruising or bleeding SKIN: No rash or lesion. MUSCULOSKELETAL: No joint pain or arthritis.   NEUROLOGIC: No tingling, numbness, weakness.  PSYCHIATRY: No anxiety or depression.   MEDICATIONS AT HOME:   Prior to Admission medications   Medication Sig Start Date End Date Taking? Authorizing Provider  acyclovir (ZOVIRAX) 200 MG capsule Take 200 mg by mouth 2 (two) times daily.   Yes [provider]  ALPRAZolam (XANAX) 0.25 MG tablet Take 1 tablet (0.25 mg total) by mouth 2 (two) times daily as needed for anxiety. 01/14/18  Yes Vaughan Basta, MD  aspirin EC 81 MG tablet Take 1 tablet (81 mg total) by mouth daily. 01/29/17  Yes Loletha Grayer, MD  carvedilol (COREG) 3.125 MG tablet Take 1 tablet (3.125 mg total) by mouth 2 (two) times daily with a meal. 12/31/17  Yes Epifanio Lesches, MD  clopidogrel (PLAVIX) 75 MG tablet Take 1 tablet (75 mg total) by mouth daily with breakfast. 04/30/17  Yes Sainani, Belia Heman, MD  dexamethasone (DECADRON) 2 MG tablet Take 8.5 mg by mouth See admin instructions. Take 8.5 mg on Thursday and Friday.   Yes [provider]  finasteride (PROSCAR) 5 MG tablet Take 2.5 mg by mouth daily.    Yes [provider]  furosemide (LASIX) 20 MG tablet Take 2 tablets (40 mg total) by mouth 2 (two) times daily. Take 40 mg bid for 5 days followed by 40 mg daily Patient taking differently: Take 40 mg by  mouth daily.  12/31/17 12/31/18 Yes Epifanio Lesches, MD  lisinopril (ZESTRIL) 2.5 MG tablet Take 1 tablet (2.5 mg total) by mouth daily. 09/20/17 09/20/18 Yes Dustin Flock, MD  lovastatin (MEVACOR) 20 MG tablet Take 20 mg by mouth daily at 6 PM.   Yes [provider]  metFORMIN (GLUCOPHAGE) 500 MG tablet Take 500 mg by mouth 2 (two) times daily with a meal.   Yes [provider]  Multiple Vitamin (MULTI-VITAMINS) TABS Take 1 tablet by mouth daily.   Yes [provider]  potassium chloride SA (K-DUR,KLOR-CON) 20 MEQ tablet Take 1 tablet (20 mEq total) by mouth daily. 09/20/17  Yes Dustin Flock, MD  lansoprazole (PREVACID) 30 MG capsule Take 30 mg by mouth 2 (two) times daily as needed.  11/03/17 11/03/18  [provider]  silver sulfADIAZINE (SILVADENE) 1 % cream Apply to affected area daily 08/03/17 08/03/18  Duanne Guess, PA-C      VITAL SIGNS:  Blood pressure (!) 97/48, pulse 65, temperature (!) 97.5 F (36.4 C), temperature source Oral, resp. rate 18, height '5\' 7"'$  (1.702 m), weight 68 kg (150 lb), SpO2 97 %.  PHYSICAL EXAMINATION:  GENERAL:  84 y.o.-year-old patient lying in the bed with no acute distress.  EYES: Pupils equal, round, reactive to light and accommodation. No scleral icterus. Extraocular muscles intact.  HEENT: Head atraumatic, normocephalic. Oropharynx and nasopharynx clear.  NECK:  Supple, no jugular venous distention. No thyroid enlargement, no tenderness.  LUNGS: Normal breath sounds bilaterally, no wheezing, rales,rhonchi or crepitation. No use of accessory muscles of respiration.  CARDIOVASCULAR: S1, S2 normal. No murmurs, rubs, or gallops.  ABDOMEN: Soft, nontender, nondistended. Bowel sounds present. No organomegaly or mass.  EXTREMITIES: No pedal edema, cyanosis, or clubbing.  Patient has tenderness in the right leg lower part. NEUROLOGIC: Cranial nerves II through XII are intact. Muscle strength 5/5 in all extremities.  Sensation intact. Gait not checked.  PSYCHIATRIC: The patient is alert and oriented x 3.  SKIN: No obvious rash, lesion, or ulcer.   LABORATORY PANEL:   CBC Recent Labs  Lab 01/16/18 0944  WBC 11.2*  HGB 11.0*  HCT 32.5*  PLT 117*   ------------------------------------------------------------------------------------------------------------------  Chemistries  Recent Labs  Lab 01/16/18 0944  NA 131*  K 4.1  CL 102  CO2 20*  GLUCOSE 134*  BUN 32*  CREATININE 1.74*  CALCIUM 9.7  AST 32  ALT 25  ALKPHOS 62  BILITOT 1.2   ------------------------------------------------------------------------------------------------------------------  Cardiac Enzymes Recent Labs  Lab 01/16/18 0944  TROPONINI 0.07*   ------------------------------------------------------------------------------------------------------------------  RADIOLOGY:  No results found.  EKG:   Orders placed or performed during the hospital encounter of 01/16/18  . ED EKG  . ED EKG    IMPRESSION AND PLAN:  84 year old male patient with multiple medical problems of essential hypertension, multiple myeloma, diabetes mellitus type 2, hyperlipidemia, recently discharged from hospital 2 days ago with Lasix 40 mg twice daily which is higher than his home dose comes in with dizziness, near syncope, fall. 1.  Near syncope and fall secondary to dehydration and overdiuresis: Monitor on telemetry. 2.  Acute renal failure with ATN secondary to dehydration, overdiuresis: Continue gentle hydration, previous echocardiogram showed EF 30%.  Continue liters of IV fluids.  Monitor kidney function closely.  Avoid diuretics, metformin.  And hold lisinopril as well. 3.  Multiple myeloma: The patient had a fall, complains of right leg pain: Check x-ray of the right leg to evaluate for tibia or fibula fracture. 4.  CAD triple stents. 5.  Chronic systolic heart failure with EF 30% with his acute renal failure, near syncope hold  Lasix and lisinopril and potassium also, continue Coreg, monitor on telemetry. 6.  History of multiple myeloma: Follow-up with oncology as an outpatient. 6.  Proximal atrial fibrillation: Controlled.    All the records are reviewed and case discussed with ED provider. Management plans discussed with the patient, family and they are in agreement.  CODE STATUS:full  TOTAL TIME TAKING CARE OF THIS PATIENT: 55 minutes.    Epifanio Lesches M.D on 01/16/2018 at 3:03 PM  Between 7am to 6pm - Pager - [phone]  After 6pm go to [email or web address] - password [email or web address] [facility]  [facility]  Office  [phone]  CC: Primary care physician; Kirk Ruths, MD  Note: This dictation was prepared with Dragon dictation along with smaller phrase technology. Any transcriptional errors that result from this process are unintentional.

## 2018-01-16 NOTE — ED Triage Notes (Signed)
Pt comes into the ED via EMS from home with c/o dizziness this morning and when he went to get up to go to the BR and had a syncopal episode on the carpeted floor. Denies any pain on arrival . Pt is currently getting weekly chem treatments at [facility] and has had a poor appetite lately. Was just here on Friday with dehydration

## 2018-01-17 ENCOUNTER — Encounter: Payer: Self-pay | Admitting: *Deleted

## 2018-01-17 LAB — BASIC METABOLIC PANEL
Anion gap: 9 (ref 5–15)
BUN: 25 mg/dL — AB (ref 6–20)
CO2: 20 mmol/L — ABNORMAL LOW (ref 22–32)
CREATININE: 1.22 mg/dL (ref 0.61–1.24)
Calcium: 8.9 mg/dL (ref 8.9–10.3)
Chloride: 103 mmol/L (ref 101–111)
GFR calc Af Amer: >60 mL/min (ref >60)
GFR calc non Af Amer: 52 mL/min — ABNORMAL LOW (ref >60)
GLUCOSE: 92 mg/dL (ref 65–99)
POTASSIUM: 4 mmol/L (ref 3.5–5.1)
SODIUM: 132 mmol/L — AB (ref 135–145)

## 2018-01-17 LAB — GLUCOSE, CAPILLARY
Glucose-Capillary: 140 mg/dL — ABNORMAL HIGH (ref 65–99)
Glucose-Capillary: 95 mg/dL (ref 65–99)
Glucose-Capillary: 110 mg/dL — ABNORMAL HIGH (ref 65–99)
Glucose-Capillary: 144 mg/dL — ABNORMAL HIGH (ref 65–99)

## 2018-01-17 LAB — CBC
HEMATOCRIT: 30.2 % — AB (ref 40.0–52.0)
Hemoglobin: 10.2 g/dL — ABNORMAL LOW (ref 13.0–18.0)
MCH: 30.2 pg (ref 26.0–34.0)
MCHC: 33.9 g/dL (ref 32.0–36.0)
MCV: 89.2 fL (ref 80.0–100.0)
PLATELETS: 85 10*3/uL — AB (ref 150–440)
RBC: 3.39 MIL/uL — ABNORMAL LOW (ref 4.40–5.90)
RDW: 17.1 % — AB (ref 11.5–14.5)
WBC: 8 10*3/uL (ref 3.8–10.6)

## 2018-01-17 MED ORDER — FUROSEMIDE 20 MG PO TABS: 20.0000 mg | ORAL_TABLET | Freq: Two times a day (BID) | ORAL | 1 refills | Status: DC

## 2018-01-17 MED ORDER — ENOXAPARIN SODIUM 40 MG/0.4ML ~~LOC~~ SOLN
40.0000 mg | SUBCUTANEOUS | Status: DC
  Administered 2018-01-17: 40 mg via SUBCUTANEOUS
  Filled 2018-01-17: qty 0.4

## 2018-01-17 NOTE — Plan of Care (Signed)
  Clinical Measurements: Ability to maintain clinical measurements within normal limits will improve 01/17/2018 1540 - Not Progressing by Daron Offer, RN Note Sodium level = only 132 today. Will continue to monitor lab values. Wenda Low [facility]

## 2018-01-17 NOTE — Discharge Summary (Signed)
Dwayne Terry, is a 84 y.o. male  DOB May 29, 1930  MRN [id number].  Admission date:  01/16/2018  Admitting Physician  Epifanio Lesches, MD  Discharge Date:  01/17/2018   Primary MD  Kirk Ruths, MD  Recommendations for primary care physician for things to follow:  Follow-up with PCP in 1 week   Admission Diagnosis  Syncope and collapse [R55] Dehydration [E86.0]   Discharge Diagnosis  Syncope and collapse [R55] Dehydration [E86.0]   Active Problems:   Acute renal failure (ARF) (HCC)      Past Medical History:  Diagnosis Date  . Cancer ([place])    bone marrow, prostate cancer  . Diabetes mellitus without complication ([place])   . Hyperlipidemia   . MI (mitral incompetence)   . MI (myocardial infarction) [facility])     Past Surgical History:  Procedure Laterality Date  . AORTIC VALVE REPLACEMENT (AVR)/CORONARY ARTERY BYPASS GRAFTING (CABG)    . CORONARY ANGIOPLASTY WITH STENT PLACEMENT    . CORONARY BALLOON ANGIOPLASTY N/A 06/10/2017   Procedure: Coronary Balloon Angioplasty;  Surgeon: Wellington Hampshire, MD;  Location: [place] [facility];  Service: Cardiovascular;  Laterality: N/A;  . CORONARY STENT INTERVENTION N/A 01/28/2017   Procedure: Coronary Stent Intervention;  Surgeon: [name], MD;  Location: [place] [facility];  Service: Cardiovascular;  Laterality: N/A;  . CORONARY STENT INTERVENTION N/A 04/29/2017   Procedure: Coronary Stent Intervention;  Surgeon: [name], MD;  Location: [place] [facility];  Service: Cardiovascular;  Laterality: N/A;  . CORONARY STENT INTERVENTION N/A 09/19/2017   Procedure: CORONARY/GRAFT ANGIOGRAPHY;  Surgeon: Corey Skains, MD;  Location: [place] [facility];  Service: Cardiovascular;  Laterality: N/A;  . CORONARY STENT INTERVENTION N/A 09/19/2017    Procedure: CORONARY STENT INTERVENTION;  Surgeon: Wellington Hampshire, MD;  Location: [place] [facility];  Service: Cardiovascular;  Laterality: N/A;  . LEFT HEART CATH AND CORONARY ANGIOGRAPHY N/A 01/28/2017   Procedure: Left Heart Cath and Coronary Angiography;  Surgeon: [name], MD;  Location: [place] [facility];  Service: Cardiovascular;  Laterality: N/A;  . LEFT HEART CATH AND CORONARY ANGIOGRAPHY N/A 10/31/2017   Procedure: LEFT HEART CATH AND CORONARY ANGIOGRAPHY;  Surgeon: Teodoro Spray, MD;  Location: [place] [facility];  Service: Cardiovascular;  Laterality: N/A;  . LEFT HEART CATH AND CORS/GRAFTS ANGIOGRAPHY N/A 04/29/2017   Procedure: Left Heart Cath and Cors/Grafts Angiography;  Surgeon: [name], MD;  Location: [place] [facility];  Service: Cardiovascular;  Laterality: N/A;  . LEFT HEART CATH AND CORS/GRAFTS ANGIOGRAPHY N/A 06/10/2017   Procedure: Left Heart Cath and Cors/Grafts Angiography;  Surgeon: Corey Skains, MD;  Location: [place] [facility];  Service: Cardiovascular;  Laterality: N/A;  . LEFT HEART CATH AND CORS/GRAFTS ANGIOGRAPHY N/A 09/19/2017   Procedure: LEFT HEART CATH;  Surgeon: Corey Skains, MD;  Location: [place] [facility];  Service: Cardiovascular;  Laterality: N/A;       History of present illness and  Hospital Course:     Kindly see H&P for history of present illness and admission details, please review complete Labs, Consult reports and Test reports for all details in brief  HPI  from the history and physical done on the day of admission 84 year old male patient discharged from hospital 2 days ago comes in with syncope, dehydration found to have acute kidney injury and admitted for the same.   Hospital Course  1. syncope with loss of consciousness secondary to dehydration: Admitted  to telemetry, received gentle hydration.  2.  Acute kidney injury secondary to overdiuresis causing dehydration,, syncope: Patient  was discharged with higher dose of Lasix at 40 mg of Lasix twice daily recently 2 days ago.  At this time he will be going home with Lasix 20 mg twice daily to avoid dehydration and syncope.  Acute kidney injury improved, creatinine is from 1.74-1.22.  3.  Chronic systolic heart failure with EF 30% by echo recently.  We will.  Continue Coreg, Plavix, low-dose diuretic, aspirin, statins, lisinopril.  4.  Diabetes mellitus type 2: Patient metformin was on hold on admission because of renal failure: But renal function is now better patient can resume metformin from tomorrow.  5. history of multiple myeloma: Patient had a fall, complaint of right leg pain: X-ray of the right tibia and fibula did not show any fracture. 6..  Deconditioning: Patient is planning to see if he can go to [facility] a physical therapy evaluation.       Discharge Condition:    Follow UP  Follow-up Information    Kirk Ruths, MD. Schedule an appointment as soon as possible for a visit in 1 week(s).   Specialty:  Internal Medicine Contact information: [address] [place] [address] [phone]             Discharge Instructions  and  Discharge Medications      Allergies as of 01/17/2018      Reactions   Antihistamines, Chlorpheniramine-type Other (See Comments)   Reaction: unknown   Nitroglycerin Other (See Comments)   Reaction: hypotension      Medication List    TAKE these medications   acyclovir 200 MG capsule Commonly known as:  ZOVIRAX Take 200 mg by mouth 2 (two) times daily.   ALPRAZolam 0.25 MG tablet Commonly known as:  XANAX Take 1 tablet (0.25 mg total) by mouth 2 (two) times daily as needed for anxiety.   aspirin EC 81 MG tablet Take 1 tablet (81 mg total) by mouth daily.   carvedilol 3.125 MG tablet Commonly known as:  COREG Take 1 tablet (3.125 mg total) by mouth 2 (two) times daily with a meal.   clopidogrel 75 MG  tablet Commonly known as:  PLAVIX Take 1 tablet (75 mg total) by mouth daily with breakfast.   dexamethasone 2 MG tablet Commonly known as:  DECADRON Take 8.5 mg by mouth See admin instructions. Take 8.5 mg on Thursday and Friday.   finasteride 5 MG tablet Commonly known as:  PROSCAR Take 2.5 mg by mouth daily.   furosemide 20 MG tablet Commonly known as:  LASIX Take 1 tablet (20 mg total) by mouth 2 (two) times daily. What changed:    how much to take  additional instructions   lansoprazole 30 MG capsule Commonly known as:  PREVACID Take 30 mg by mouth 2 (two) times daily as needed.   lisinopril 2.5 MG tablet Commonly known as:  ZESTRIL Take 1 tablet (2.5 mg total) by mouth daily.   lovastatin 20 MG tablet Commonly known as:  MEVACOR Take 20 mg by mouth daily at 6 PM.   metFORMIN 500 MG tablet Commonly known as:  GLUCOPHAGE Take 500 mg by mouth 2 (two) times daily with a meal.   MULTI-VITAMINS Tabs Take 1 tablet by mouth daily.   potassium chloride SA 20 MEQ tablet Commonly known as:  K-DUR,KLOR-CON Take 1 tablet (20 mEq total) by mouth daily.  silver sulfADIAZINE 1 % cream Commonly known as:  SILVADENE Apply to affected area daily         Diet and Activity recommendation: See Discharge Instructions above   Consults obtained -none   Major procedures and Radiology Reports - PLEASE review detailed and final reports for all details, in brief -      Ct Abdomen Pelvis Wo Contrast  Result Date: 12/29/2017 CLINICAL DATA:  84 y/o M; chest pain with history of bone, prostate, and blood cancer on chemotherapy. EXAM: CT CHEST, ABDOMEN AND PELVIS WITHOUT CONTRAST TECHNIQUE: Multidetector CT imaging of the chest, abdomen and pelvis was performed following the standard protocol without IV contrast. COMPARISON:  None. FINDINGS: CT CHEST FINDINGS Cardiovascular: Normal heart size. No pericardial effusion. Status post CABG with LIMA and saphenous grafts. Severe  coronary artery calcification. Aortic valvular calcification associated with aortic stenosis. Normal caliber thoracic aorta and main pulmonary artery. Calcific atherosclerosis of aorta. Mediastinum/Nodes: No enlarged mediastinal, hilar, or axillary lymph nodes. Thyroid gland, trachea, and esophagus demonstrate no significant findings. Lungs/Pleura: Smooth interlobular septal thickening and mild peribronchial thickening compatible with interstitial pulmonary edema. No alveolar pulmonary edema. No pleural effusion. No pneumothorax. Coarse reticulation of the lung peripheries greatest at lung bases, probably a component of mild pulmonary fibrosis. Musculoskeletal: Healed median sternotomy. Multiple chronic rib fractures bilaterally. No acute fracture or destructive bony lesion identified. CT ABDOMEN PELVIS FINDINGS Hepatobiliary: Liver segment 2 subcentimeter cyst. Otherwise no focal liver lesion identified. Cholelithiasis. No biliary ductal dilatation. Pancreas: Unremarkable. No pancreatic ductal dilatation or surrounding inflammatory changes. Spleen: Normal in size without focal abnormality. Adrenals/Urinary Tract: Adrenal glands are unremarkable. Kidneys are normal, without renal calculi, focal lesion, or hydronephrosis. Bladder is unremarkable. Stomach/Bowel: Stomach is within normal limits. Appendix appears normal. No evidence of bowel wall thickening, distention, or inflammatory changes. Mild sigmoid diverticulosis. Vascular/Lymphatic: Aortic atherosclerosis. No enlarged abdominal or pelvic lymph nodes. Reproductive: Mild prostate enlargement. Other: No abdominal wall hernia or abnormality. No abdominopelvic ascites. Musculoskeletal: No acute fracture or destructive bony lesion identified. Mild multilevel discogenic degenerative changes of the thoracolumbar spine and moderate lower lumbar facet arthrosis. No high-grade bony canal stenosis. IMPRESSION: 1. Interstitial pulmonary edema. 2. Probable superimposed mild  pulmonary fibrosis. 3. Severe coronary artery calcification.  Status post CABG. 4. Aortic valvular calcification associated with aortic stenosis. 5. Aortic atherosclerosis with extensive infrarenal calcification. 6. Cholelithiasis. 7. Sigmoid diverticulosis. Electronically Signed   By: Kristine Garbe M.D.   On: 12/29/2017 22:49   Dg Chest 2 View  Result Date: 12/29/2017 CLINICAL DATA:  Chest pain. History of MI, status post open heart surgery in 1989. EXAM: CHEST  2 VIEW COMPARISON:  Chest x-rays dated 10/28/2017 and 01/27/2017. FINDINGS: Heart size upper normal, stable. Overall cardiomediastinal silhouette is stable in size and configuration. Median sternotomy wires appear intact and stable in alignment. Coarse lung markings noted bilaterally, increased compared to previous study, most suggestive of interstitial edema superimposed on chronic interstitial lung disease/fibrosis. Lucency is seen along the medial aspects of the left hemidiaphragm, suspicious for free intraperitoneal air. IMPRESSION: 1. Lucency along the medial aspect of the left hemidiaphragm, of uncertain etiology, possibly free intraperitoneal air within the upper abdomen. Recommend CT abdomen for further characterization. 2. Increasingly coarse lung markings bilaterally, most likely interstitial edema superimposed on worsening chronic interstitial lung disease/fibrosis. These results were called by telephone at the time of interpretation on 12/29/2017 at 9:53 pm to Dr. Rudene Re , who verbally acknowledged these results. Electronically Signed   By: Cherlynn Kaiser  Enriqueta Shutter M.D.   On: 12/29/2017 21:54   Dg Tibia/fibula Right  Result Date: 01/16/2018 CLINICAL DATA:  Lower leg pain after fall. EXAM: RIGHT TIBIA AND FIBULA - 2 VIEW COMPARISON:  None. FINDINGS: There is no evidence of fracture or other focal bone lesions. Osteopenia. Vascular calcifications. IMPRESSION: No acute osseous abnormality. Electronically Signed   By: Titus Dubin M.D.   On: 01/16/2018 17:01   Ct Chest Wo Contrast  Result Date: 12/29/2017 CLINICAL DATA:  84 y/o M; chest pain with history of bone, prostate, and blood cancer on chemotherapy. EXAM: CT CHEST, ABDOMEN AND PELVIS WITHOUT CONTRAST TECHNIQUE: Multidetector CT imaging of the chest, abdomen and pelvis was performed following the standard protocol without IV contrast. COMPARISON:  None. FINDINGS: CT CHEST FINDINGS Cardiovascular: Normal heart size. No pericardial effusion. Status post CABG with LIMA and saphenous grafts. Severe coronary artery calcification. Aortic valvular calcification associated with aortic stenosis. Normal caliber thoracic aorta and main pulmonary artery. Calcific atherosclerosis of aorta. Mediastinum/Nodes: No enlarged mediastinal, hilar, or axillary lymph nodes. Thyroid gland, trachea, and esophagus demonstrate no significant findings. Lungs/Pleura: Smooth interlobular septal thickening and mild peribronchial thickening compatible with interstitial pulmonary edema. No alveolar pulmonary edema. No pleural effusion. No pneumothorax. Coarse reticulation of the lung peripheries greatest at lung bases, probably a component of mild pulmonary fibrosis. Musculoskeletal: Healed median sternotomy. Multiple chronic rib fractures bilaterally. No acute fracture or destructive bony lesion identified. CT ABDOMEN PELVIS FINDINGS Hepatobiliary: Liver segment 2 subcentimeter cyst. Otherwise no focal liver lesion identified. Cholelithiasis. No biliary ductal dilatation. Pancreas: Unremarkable. No pancreatic ductal dilatation or surrounding inflammatory changes. Spleen: Normal in size without focal abnormality. Adrenals/Urinary Tract: Adrenal glands are unremarkable. Kidneys are normal, without renal calculi, focal lesion, or hydronephrosis. Bladder is unremarkable. Stomach/Bowel: Stomach is within normal limits. Appendix appears normal. No evidence of bowel wall thickening, distention, or inflammatory  changes. Mild sigmoid diverticulosis. Vascular/Lymphatic: Aortic atherosclerosis. No enlarged abdominal or pelvic lymph nodes. Reproductive: Mild prostate enlargement. Other: No abdominal wall hernia or abnormality. No abdominopelvic ascites. Musculoskeletal: No acute fracture or destructive bony lesion identified. Mild multilevel discogenic degenerative changes of the thoracolumbar spine and moderate lower lumbar facet arthrosis. No high-grade bony canal stenosis. IMPRESSION: 1. Interstitial pulmonary edema. 2. Probable superimposed mild pulmonary fibrosis. 3. Severe coronary artery calcification.  Status post CABG. 4. Aortic valvular calcification associated with aortic stenosis. 5. Aortic atherosclerosis with extensive infrarenal calcification. 6. Cholelithiasis. 7. Sigmoid diverticulosis. Electronically Signed   By: Kristine Garbe M.D.   On: 12/29/2017 22:49   Dg Chest Port 1 View  Result Date: 01/14/2018 CLINICAL DATA:  Central chest pain today. EXAM: PORTABLE CHEST 1 VIEW COMPARISON:  Radiographs and CT 12/29/2017 FINDINGS: Post median sternotomy. Unchanged cardiomegaly and mediastinal contours. Improved pulmonary edema from prior exam with mild residual. Peripheral reticular opacities most prominent at the right lung base consistent with pulmonary fibrosis. No confluent consolidation. No large pleural effusion or pneumothorax. IMPRESSION: 1. Improved pulmonary edema from prior with mild residual. Stable cardiomegaly. 2. Probable pulmonary fibrosis. Electronically Signed   By: Jeb Levering M.D.   On: 01/14/2018 00:42    Micro Results    No results found for this or any previous visit (from the past 240 hour(s)).     Today   Subjective:   [name] today has no headache,no chest abdominal pain,no new weakness tingling or numbness, feels much better wants to go home today.   Objective:   Blood pressure 139/67, pulse 72, temperature 97.7 F (36.5  C), temperature source  Axillary, resp. rate 18, height '5\' 8"'$  (1.727 m), weight 67 kg (147 lb 11.2 oz), SpO2 100 %.   Intake/Output Summary (Last 24 hours) at 01/17/2018 1044 Last data filed at 01/17/2018 1031 Gross per 24 hour  Intake 1889.99 ml  Output 700 ml  Net 1189.99 ml    Exam Awake Alert, Oriented x 3, No new F.N deficits, Normal affect [place].AT,PERRAL Supple Neck,No JVD, No cervical lymphadenopathy appriciated.  Symmetrical Chest wall movement, Good air movement bilaterally, CTAB RRR,No Gallops,Rubs or new Murmurs, No Parasternal Heave +ve B.Sounds, Abd Soft, Non tender, No organomegaly appriciated, No rebound -guarding or rigidity. No Cyanosis, Clubbing or edema, No new Rash or bruise  Data Review   CBC w Diff:  Lab Results  Component Value Date   WBC 8.0 01/17/2018   HGB 10.2 (L) 01/17/2018   HGB 13.8 07/10/2013   HCT 30.2 (L) 01/17/2018   HCT 37.9 (L) 07/10/2013   PLT 85 (L) 01/17/2018   PLT 93 (L) 07/10/2013   LYMPHOPCT 4 01/14/2018   LYMPHOPCT 27.9 07/09/2013   MONOPCT 6 01/14/2018   MONOPCT 8 07/10/2013   MONOPCT 17.4 07/09/2013   EOSPCT 0 01/14/2018   EOSPCT 0.2 07/09/2013   BASOPCT 0 01/14/2018   BASOPCT 0.4 07/09/2013    CMP:  Lab Results  Component Value Date   NA 132 (L) 01/17/2018   NA 136 07/10/2013   K 4.0 01/17/2018   K 4.2 07/10/2013   CL 103 01/17/2018   CL 106 07/10/2013   CO2 20 (L) 01/17/2018   CO2 27 07/10/2013   BUN 25 (H) 01/17/2018   BUN 16 07/10/2013   CREATININE 1.22 01/17/2018   CREATININE 0.97 07/10/2013   PROT 7.0 01/16/2018   PROT 7.8 07/07/2013   ALBUMIN 3.5 01/16/2018   ALBUMIN 3.2 (L) 07/07/2013   BILITOT 1.2 01/16/2018   BILITOT 0.4 07/07/2013   ALKPHOS 62 01/16/2018   ALKPHOS 59 07/07/2013   AST 32 01/16/2018   AST 22 07/07/2013   ALT 25 01/16/2018   ALT 20 07/07/2013  .   Total Time in preparing paper work, data evaluation and todays exam - 35 minutes  Epifanio Lesches M.D on 01/17/2018 at 10:44 AM    Note: This  dictation was prepared with Dragon dictation along with smaller phrase technology. Any transcriptional errors that result from this process are unintentional.

## 2018-01-17 NOTE — Progress Notes (Signed)
Pharmacy Lovenox Dosing  84 y.o. male admitted with Loss of Consciousness . Patient ordered Lovenox 30 mg daily for VTE prophylaxis.   Filed Weights   01/16/18 0936 01/16/18 1553 01/17/18 0446  Weight: 150 lb (68 kg) 150 lb 0 oz (68 kg) 147 lb 11.2 oz (67 kg)    Body mass index is 22.46 kg/m.  Estimated Creatinine Clearance: 42 mL/min (by C-G formula based on SCr of 1.22 mg/dL).  Will adjust Lovenox dosing back to 40 mg daily for with ARF resolving.   Napoleon Form 01/17/2018 8:38 AM

## 2018-01-17 NOTE — Progress Notes (Signed)
84 year old who was discharged from hospital 2 days ago returned to ED with syncope with LOC secondary to  dehydration.  Admitted with dx of Acute Kidney Injury.    Active problem list this admission: 1. Syncope with LOC secondary to dehydration.   2. AKI secondary to over diuresis causing dehydration 3. Chronic systolic HF with EF of 68% by echo recently.  Patient remains on Coreg, plavix, low-dose diuretic, aspirin, statins, and lisinopril.   4. DM Type 2 5. History of multiple myeloma.   6. Deconditioning.  PT evaluation recommending STR.    Rounded on patient.  Patient had been participating in Cardiac Rehab, but has been unable to attend since his first day on December 12, 2017 due to chemo treatments and issues with his back.  In addition, patient has had repeated admissions (3) since 12/29/2017 to the hospital.  First two admissions were due to chest pain and the third as above.     Physical Therapist evaluated patient today and is recommending short term rehab for patient.    The 5 Steps of Living Better with Heart Failure were reviewed with patient.    1. Weigh daily and compare to previous day's weight. 2. Assess your symptoms and how your are feeling and take action if needed according to heart failure zones.      Reviewed the following information with patient:    *Discussed when to call the Dr= weight gain of >2-3lb overnight of 5lb in a week,    *Discussed yellow zone= call MD: weight gain of >2-3lb overnight of 5lb in a week, increased swelling,   increased SOB when lying down, chest discomfort, dizziness, increased fatigue   *Red Zone= call 911: struggle to breath, fainting or near fainting, significant chest pain   3.  Know your medications and take as prescribed. 4.  Diet - Patient reports following low sodium diet.  States he does not eat salt.   5.  Exercise - Patient not exercising and is unable to participate in cardiac rehab at this time.  Patient reported that he has all  the exercise equipment he possibly needs at home, but he is just unable to use it right now due to weakness and overall deconditioning.  Physical Therapy evaluated patient today and      recommended STR.  Patient reported that his BP dropped when he attempted to walk in the room with the therapist.    Patient, wife, and daughter thanked this nurse for stopping by and reviewing the above information.    Roanna Epley, RN, BSN, [facility] Cardiovascular and Pulmonary Nurse Navigator

## 2018-01-17 NOTE — Care Management (Signed)
Physical therapy informed CM that patient orthostatic with symptoms.  patient dropped his blood pressure 32 points systolic from lying to standing to the point patient could not participate fully with the therapist.  This finding has been reported to patient primary nurse

## 2018-01-17 NOTE — Progress Notes (Signed)
Physical  therapy recommended skilled nursing.

## 2018-01-17 NOTE — Care Management (Addendum)
CM informed that physical therapy has recommended skilled nursing facility placement.  Note is pending. Explained to patient's daughter that patient's insurance would have to approve the stay.  Obtained order for occupational therapy evaluation as [facility] usually requests an evaluation.  Patient is currently receiving chem for multiple myeloma.  Per patient's daughter, patient- once very very active, has been on a functional decline since October but over the past month or so, he is in a debilitated state.  CM reaching to attending regarding plan of care.

## 2018-01-17 NOTE — Evaluation (Signed)
Physical Therapy Evaluation Patient Details Name: Dwayne Terry MRN: [id number] DOB: 1930/11/03 Today's Date: 01/17/2018   History of Present Illness  Pt is an87 y.o.malewith a known history of multiple myeloma, prostate cancer, diabetes mellitus type 2, hyperlipidemia presents to ED with dizziness. Patient had couple of instances of syncopal events at home resulting in falls and RLE pain. Pt was discharged from hospital 2 days ago with higher dose of Lasix. Blood pressure was low here 97/48. Patient has no headache. No other complaints like shortness of breath.  Assessment includes:  Syncope with loss of consciousness secondary to dehydration, acute kidney injury secondary to overdiuresis, chronic CHF with EF 30%, DM II, multiple myeloma, and deconditioning.     Clinical Impression  Pt presents with deficits in strength, transfers, mobility, gait, and balance with activity tolerance unable to be assessed secondary to orthostatic hypotension as follows:  lying - 117/47 mmHg, sitting - 130/58 mmHg, standing - 98/55 mmHg.  Pt's SpO2 98-100% throughout session with HR 67-72 bpm.  Pt asymptomatic while in standing despite significant drop in systolic BP but pt returned to sitting and amb deferred secondary to recent history of syncopal episodes and falls.  During session pt was grossly weak and deconditioned and required significantly increase time and effort during bed mobility tasks and during sit to stand transfer from EOB.  Pt may require PT services in a SNF setting upon discharge to safely address above deficits for decreased caregiver assistance and eventual return to PLOF.      Follow Up Recommendations SNF    Equipment Recommendations  None recommended by PT    Recommendations for Other Services       Precautions / Restrictions Precautions Precautions: Fall Restrictions Weight Bearing Restrictions: No      Mobility  Bed Mobility Overal bed mobility: Modified  Independent             General bed mobility comments: Extra time and effort required for all bed mobility tasks but no physical assistance needed  Transfers Overall transfer level: Needs assistance Equipment used: Rolling walker (2 wheeled) Transfers: Sit to/from Stand Sit to Stand: Min guard;From elevated surface         General transfer comment: Effortful stand from elevated EOB but no physical assistance required  Ambulation/Gait             General Gait Details: NT this session secondary to significant drop in BP from sitting to standing per note  Stairs            Wheelchair Mobility    Modified Rankin (Stroke Patients Only)       Balance Overall balance assessment: Needs assistance Sitting-balance support: Bilateral upper extremity supported;Feet supported Sitting balance-Leahy Scale: Good     Standing balance support: Single extremity supported Standing balance-Leahy Scale: Good                               Pertinent Vitals/Pain Pain Assessment: No/denies pain    Home Living Family/patient expects to be discharged to:: Private residence Living Arrangements: Spouse/significant other Available Help at Discharge: Family;Friend(s);Available 24 hours/day Type of Home: House Home Access: Stairs to enter Entrance Stairs-Rails: Right;Left;Can reach both Entrance Stairs-Number of Steps: 1 Home Layout: One level Home Equipment: Walker - 2 wheels;Walker - 4 wheels;Cane - single point      Prior Function Level of Independence: Independent with assistive device(s)  Comments: Mod Ind amb with rollator limited community distances, Ind with ADLs, no other fall history other than current falls leading to this admission     Hand Dominance   Dominant Hand: Right    Extremity/Trunk Assessment   Upper Extremity Assessment Upper Extremity Assessment: Generalized weakness    Lower Extremity Assessment Lower Extremity  Assessment: Generalized weakness       Communication   Communication: No difficulties  Cognition Arousal/Alertness: Awake/alert Behavior During Therapy: WFL for tasks assessed/performed Overall Cognitive Status: Within Functional Limits for tasks assessed                                        General Comments      Exercises Total Joint Exercises Ankle Circles/Pumps: AROM;Both;10 reps Quad Sets: Both;10 reps;Strengthening Gluteal Sets: Strengthening;Both;10 reps Towel Squeeze: Strengthening;Both;10 reps Hip ABduction/ADduction: AROM;Both;10 reps Straight Leg Raises: AROM;Both;10 reps Long Arc Quad: AROM;Both;10 reps Knee Flexion: AROM;Both;10 reps Other Exercises Other Exercises: HEP education for BLE APs, QS, GS, and LAQs x 10 each 5-6x/day   Assessment/Plan    PT Assessment Patient needs continued PT services  PT Problem List Decreased strength;Decreased activity tolerance;Decreased balance;Decreased mobility       PT Treatment Interventions DME instruction;Gait training;Stair training;Functional mobility training;Balance training;Therapeutic exercise;Therapeutic activities;Patient/family education    PT Goals (Current goals can be found in the Care Plan section)  Acute Rehab PT Goals Patient Stated Goal: "To be strong enough to take care of myself" PT Goal Formulation: With patient Time For Goal Achievement: 01/30/18 Potential to Achieve Goals: Good    Frequency Min 2X/week   Barriers to discharge        Co-evaluation               AM-PAC PT "6 Clicks" Daily Activity  Outcome Measure Difficulty turning over in bed (including adjusting bedclothes, sheets and blankets)?: A Little Difficulty moving from lying on back to sitting on the side of the bed? : A Little Difficulty sitting down on and standing up from a chair with arms (e.g., wheelchair, bedside commode, etc,.)?: Unable Help needed moving to and from a bed to chair (including a  wheelchair)?: A Little Help needed walking in hospital room?: A Lot Help needed climbing 3-5 steps with a railing? : A Lot 6 Click Score: 14    End of Session Equipment Utilized During Treatment: Gait belt Activity Tolerance: Patient tolerated treatment well Patient left: in bed;with call bell/phone within reach;with bed alarm set Nurse Communication: Mobility status;Other (comment)(Orthostatic results) PT Visit Diagnosis: Muscle weakness (generalized) (M62.81);Difficulty in walking, not elsewhere classified (R26.2)    Time: 2426-8341 PT Time Calculation (min) (ACUTE ONLY): 41 min   Charges:   PT Evaluation $PT Eval Low Complexity: 1 Low PT Treatments $Therapeutic Exercise: 8-22 mins   PT G Codes:        DRoyetta Asal PT, DPT 01/17/18, 4:35 PM

## 2018-01-18 LAB — GLUCOSE, CAPILLARY
GLUCOSE-CAPILLARY: 109 mg/dL — AB (ref 65–99)
Glucose-Capillary: 138 mg/dL — ABNORMAL HIGH (ref 65–99)

## 2018-01-18 NOTE — Clinical Social Work Note (Signed)
CSW received referral for SNF.  Case discussed with case manager and plan is to discharge home with home health.  CSW to sign off please re-consult if social work needs arise.  Mackenze Grandison R. Arien Morine, MSW, LCSWA [phone]  

## 2018-01-18 NOTE — Progress Notes (Signed)
Cardiac Individual Treatment Plan  Patient Details  Name: Dwayne Terry MRN: [id number] Date of Birth: 03-31-1930 Referring Provider:     Cardiac Rehab from 12/05/2017 in [facility] Cardiac and Pulmonary Rehab  Referring Provider  Kathlyn Sacramento MD      Initial Encounter Date:    Cardiac Rehab from 12/05/2017 in [facility] Cardiac and Pulmonary Rehab  Date  12/05/17  Referring Provider  Kathlyn Sacramento MD      Visit Diagnosis: No diagnosis found.  Patient's Home Medications on Admission: No current facility-administered medications for this visit.  No current outpatient medications on file.  Facility-Administered Medications Ordered in Other Visits:  .  acetaminophen (TYLENOL) tablet 650 mg, 650 mg, Oral, Q6H PRN **OR** acetaminophen (TYLENOL) suppository 650 mg, 650 mg, Rectal, Q6H PRN, Epifanio Lesches, MD .  acyclovir (ZOVIRAX) 200 MG capsule 200 mg, 200 mg, Oral, BID, Epifanio Lesches, MD, 200 mg at 01/18/18 0903 .  ALPRAZolam Duanne Moron) tablet 0.25 mg, 0.25 mg, Oral, BID PRN, Epifanio Lesches, MD, 0.25 mg at 01/18/18 0903 .  aspirin EC tablet 81 mg, 81 mg, Oral, Daily, Epifanio Lesches, MD, 81 mg at 01/18/18 0903 .  bisacodyl (DULCOLAX) EC tablet 5 mg, 5 mg, Oral, Daily PRN, Epifanio Lesches, MD, 5 mg at 01/18/18 0908 .  carvedilol (COREG) tablet 3.125 mg, 3.125 mg, Oral, BID WC, Epifanio Lesches, MD, 3.125 mg at 01/18/18 0903 .  clopidogrel (PLAVIX) tablet 75 mg, 75 mg, Oral, Q breakfast, Epifanio Lesches, MD, 75 mg at 01/18/18 0903 .  [START ON 01/19/2018] dexamethasone (DECADRON) tablet 8.5 mg, 8.5 mg, Oral, Once per day on Thu Fri, [place], Lise Auer, MD .  enoxaparin (LOVENOX) injection 40 mg, 40 mg, Subcutaneous, Q24H, Epifanio Lesches, MD, 40 mg at 01/17/18 1509 .  finasteride (PROSCAR) tablet 2.5 mg, 2.5 mg, Oral, Daily, Epifanio Lesches, MD, 2.5 mg at 01/18/18 0903 .  insulin aspart (novoLOG) injection 0-9 Units, 0-9 Units, Subcutaneous, TID WC,  Epifanio Lesches, MD, 1 Units at 01/18/18 1211 .  multivitamin with minerals tablet 1 tablet, 1 tablet, Oral, Daily, Epifanio Lesches, MD, 1 tablet at 01/18/18 0903 .  ondansetron (ZOFRAN) tablet 4 mg, 4 mg, Oral, Q6H PRN **OR** ondansetron (ZOFRAN) injection 4 mg, 4 mg, Intravenous, Q6H PRN, Epifanio Lesches, MD .  pantoprazole (PROTONIX) EC tablet 40 mg, 40 mg, Oral, QAC breakfast, Epifanio Lesches, MD, 40 mg at 01/18/18 0902 .  potassium chloride SA (K-DUR,KLOR-CON) CR tablet 20 mEq, 20 mEq, Oral, Daily, Epifanio Lesches, MD, 20 mEq at 01/18/18 0902 .  pravastatin (PRAVACHOL) tablet 10 mg, 10 mg, Oral, q1800, Epifanio Lesches, MD, 10 mg at 01/17/18 1708  Past Medical History: Past Medical History:  Diagnosis Date  . Cancer ([place])    bone marrow, prostate cancer  . Diabetes mellitus without complication ([place])   . Hyperlipidemia   . MI (mitral incompetence)   . MI (myocardial infarction) ([place])     Tobacco Use: Social History   Tobacco Use  Smoking Status Former Smoker  . Last attempt to quit: 2015  . 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   Exercise Prescription Goal: Initial exercise prescription builds to 30-45 minutes a day of aerobic activity, 2-3 days per week.  Home exercise guidelines will be given to patient during program as part of exercise prescription that the participant will acknowledge.  Activity Barriers & Risk  Stratification: Activity Barriers & Cardiac Risk Stratification - 12/05/17 1255      Activity Barriers & Cardiac Risk Stratification   Activity Barriers  Back Problems;Balance Concerns;Assistive Device;Muscular Weakness;Deconditioning    Cardiac Risk Stratification  High       6 Minute Walk: 6 Minute Walk    Row Name 12/05/17 1418         6 Minute Walk   Phase  Initial     Distance  1068 feet     Walk Time  6 minutes     # of Rest Breaks  0     MPH  2.02     METS  1.79     RPE  13     VO2 Peak  6.27     Symptoms  No     Resting HR  71 bpm     Resting BP  132/74     Resting Oxygen Saturation   99 %     Exercise Oxygen Saturation  during 6 min walk  100 %     Max Ex. HR  96 bpm     Max Ex. BP  136/66     2 Minute Post BP  112/66        Oxygen Initial Assessment:   Oxygen Re-Evaluation:   Oxygen Discharge (Final Oxygen Re-Evaluation):   Initial Exercise Prescription: Initial Exercise Prescription - 12/05/17 1400      Date of Initial Exercise RX and Referring Provider   Date  12/05/17    Referring Provider  Kathlyn Sacramento MD      NuStep   Level  1    SPM  80    Minutes  15    METs  1.7      Arm Ergometer   Level  1    Watts  21    RPM  25    Minutes  15    METs  1.7      Track   Laps  25    Minutes  15    METs  2.15      Prescription Details   Frequency (times per week)  3    Duration  Progress to 45 minutes of aerobic exercise without signs/symptoms of physical distress      Intensity   THRR 40-80% of Max Heartrate  97-122    Ratings of Perceived Exertion  11-13    Perceived Dyspnea  0-4      Progression   Progression  Continue to progress workloads to maintain intensity without signs/symptoms of physical distress.      Resistance Training   Training Prescription  Yes    Weight  3 lbs    Reps  10-15       Perform Capillary Blood Glucose checks as needed.  Exercise Prescription Changes: Exercise Prescription Changes    Row Name  12/05/17 1200             Response to Exercise   Blood Pressure (Admit)  132/74  Blood Pressure (Exercise)  136/66       Blood Pressure (Exit)  112/66       Heart Rate (Admit)  71 bpm       Heart Rate (Exercise)  96 bpm       Heart Rate (Exit)  65 bpm       Oxygen Saturation (Admit)  99 %       Oxygen Saturation (Exit)  100 %       Rating of Perceived Exertion (Exercise)  13       Symptoms  none       Comments  walk test results          Exercise Comments: Exercise Comments    Row Name 12/12/17 1730           Exercise Comments  First full day of exercise!  Patient was oriented to gym and equipment including functions, settings, policies, and procedures.  Patient's individual exercise prescription and treatment plan were reviewed.  All starting workloads were established based on the results of the 6 minute walk test done at initial orientation visit.  The plan for exercise progression was also introduced and progression will be customized based on patient's performance and goals.          Exercise Goals and Review: Exercise Goals    Row Name 12/05/17 1425             Exercise Goals   Increase Physical Activity  Yes       Intervention  Provide advice, education, support and counseling about physical activity/exercise needs.;Develop an individualized exercise prescription for aerobic and resistive training based on initial evaluation findings, risk stratification, comorbidities and participant's personal goals.       Expected Outcomes  Achievement of increased cardiorespiratory fitness and enhanced flexibility, muscular endurance and strength shown through measurements of functional capacity and personal statement of participant.       Increase Strength and Stamina  Yes       Intervention  Provide advice, education, support and counseling about physical activity/exercise needs.;Develop an individualized exercise prescription for aerobic and resistive training based on  initial evaluation findings, risk stratification, comorbidities and participant's personal goals.       Expected Outcomes  Achievement of increased cardiorespiratory fitness and enhanced flexibility, muscular endurance and strength shown through measurements of functional capacity and personal statement of participant.       Able to understand and use rate of perceived exertion (RPE) scale  Yes       Intervention  Provide education and explanation on how to use RPE scale       Expected Outcomes  Short Term: Able to use RPE daily in rehab to express subjective intensity level;Long Term:  Able to use RPE to guide intensity level when exercising independently       Knowledge and understanding of Target Heart Rate Range (THRR)  Yes       Intervention  Provide education and explanation of THRR including how the numbers were predicted and where they are located for reference       Expected Outcomes  Short Term: Able to state/look up THRR;Long Term: Able to use THRR to govern intensity when exercising independently;Short Term: Able to use daily as guideline for intensity in rehab       Able to check pulse independently  Yes       Intervention  Provide education and demonstration on how to check pulse in carotid and radial arteries.;Review  the importance of being able to check your own pulse for safety during independent exercise       Expected Outcomes  Long Term: Able to check pulse independently and accurately;Short Term: Able to explain why pulse checking is important during independent exercise       Understanding of Exercise Prescription  Yes       Intervention  Provide education, explanation, and written materials on patient's individual exercise prescription       Expected Outcomes  Short Term: Able to explain program exercise prescription;Long Term: Able to explain home exercise prescription to exercise independently          Exercise Goals Re-Evaluation : Exercise Goals Re-Evaluation    Row Name  12/12/17 1731             Exercise Goal Re-Evaluation   Exercise Goals Review  Increase Physical Activity;Increase Strength and Stamina;Able to understand and use rate of perceived exertion (RPE) scale;Knowledge and understanding of Target Heart Rate Range (THRR)       Comments  Reviewed RPE scale, THR and program prescription with pt today.  Pt voiced understanding and was given a copy of goals to take home.        Expected Outcomes  Short: Use RPE daily to regulate intensity.  Long: Follow program prescription in THR.          Discharge Exercise Prescription (Final Exercise Prescription Changes): Exercise Prescription Changes - 12/05/17 1200      Response to Exercise   Blood Pressure (Admit)  132/74    Blood Pressure (Exercise)  136/66    Blood Pressure (Exit)  112/66    Heart Rate (Admit)  71 bpm    Heart Rate (Exercise)  96 bpm    Heart Rate (Exit)  65 bpm    Oxygen Saturation (Admit)  99 %    Oxygen Saturation (Exit)  100 %    Rating of Perceived Exertion (Exercise)  13    Symptoms  none    Comments  walk test results       Nutrition:  Target Goals: Understanding of nutrition guidelines, daily intake of sodium <1574m, cholesterol <2071m calories 30% from fat and 7% or less from saturated fats, daily to have 5 or more servings of fruits and vegetables.  Biometrics: Pre Biometrics - 12/05/17 1426      Pre Biometrics   Height  5' 7.2" (1.707 m)    Weight  163 lb 9.6 oz (74.2 kg)    Waist Circumference  35 inches    Hip Circumference  40 inches    Waist to Hip Ratio  0.88 %    BMI (Calculated)  25.47    Single Leg Stand  3.14 seconds        Nutrition Therapy Plan and Nutrition Goals: Nutrition Therapy & Goals - 12/05/17 1252      Intervention Plan   Intervention  Prescribe, educate and counsel regarding individualized specific dietary modifications aiming towards targeted core components such as weight, hypertension, lipid management, diabetes, heart failure  and other comorbidities.;Nutrition handout(s) given to patient.    Expected Outcomes  Short Term Goal: Understand basic principles of dietary content, such as calories, fat, sodium, cholesterol and nutrients.;Short Term Goal: A plan has been developed with personal nutrition goals set during dietitian appointment.;Long Term Goal: Adherence to prescribed nutrition plan.       Nutrition Assessments: Nutrition Assessments - 12/05/17 1219      MEDFICTS Scores   Pre Score  --  will bring paperwork to first day of class       Nutrition Goals Re-Evaluation:   Nutrition Goals Discharge (Final Nutrition Goals Re-Evaluation):   Psychosocial: Target Goals: Acknowledge presence or absence of significant depression and/or stress, maximize coping skills, provide positive support system. Participant is able to verbalize types and ability to use techniques and skills needed for reducing stress and depression.   Initial Review & Psychosocial Screening: Initial Psych Review & Screening - 12/05/17 1249      Initial Review   Current issues with  Current Sleep Concerns;Current Stress Concerns    Source of Stress Concerns  Chronic Illness;Family    Comments  Bill's wife is having memory issues and is causing stress at home. He also has chemo every Thursday for bone cancer, he states that his doctor told him cancer won't kill him but his heart will give out first. He says he is content with that, just trying to live his best life.       Family Dynamics   Good Support System?  Yes wife, daughter      Barriers   Psychosocial barriers to participate in program  There are no identifiable barriers or psychosocial needs.      Screening Interventions   Interventions  Yes;Encouraged to exercise;Program counselor consult    Expected Outcomes  Short Term goal: Utilizing psychosocial counselor, staff and physician to assist with identification of specific Stressors or current issues interfering with healing  process. Setting desired goal for each stressor or current issue identified.;Long Term Goal: Stressors or current issues are controlled or eliminated.;Short Term goal: Identification and review with participant of any Quality of Life or Depression concerns found by scoring the questionnaire.;Long Term goal: The participant improves quality of Life and PHQ9 Scores as seen by post scores and/or verbalization of changes       Quality of Life Scores:  Quality of Life - 12/05/17 1252      Quality of Life Scores   Health/Function Pre  -- will bring paperwork to first day of class      Scores of 19 and below usually indicate a poorer quality of life in these areas.  A difference of  2-3 points is a clinically meaningful difference.  A difference of 2-3 points in the total score of the Quality of Life Index has been associated with significant improvement in overall quality of life, self-image, physical symptoms, and general health in studies assessing change in quality of life.  PHQ-9: Recent Review Flowsheet Data    Depression screen [facility] 2/9 12/05/2017   Decreased Interest 0   Down, Depressed, Hopeless 0   PHQ - 2 Score 0   Altered sleeping 1   Tired, decreased energy 1   Change in appetite 0   Feeling bad or failure about yourself  0   Trouble concentrating 0   Moving slowly or fidgety/restless 0   Suicidal thoughts 0   PHQ-9 Score 2   Difficult doing work/chores Not difficult at all     Interpretation of Total Score  Total Score Depression Severity:  1-4 = Minimal depression, 5-9 = Mild depression, 10-14 = Moderate depression, 15-19 = Moderately severe depression, 20-27 = Severe depression   Psychosocial Evaluation and Intervention:   Psychosocial Re-Evaluation:   Psychosocial Discharge (Final Psychosocial Re-Evaluation):   Vocational Rehabilitation: Provide vocational rehab assistance to qualifying candidates.   Vocational Rehab Evaluation & Intervention: Vocational Rehab -  12/05/17 1254      Initial Vocational Rehab  Evaluation & Intervention   Assessment shows need for Vocational Rehabilitation  No       Education: Education Goals: Education classes will be provided on a variety of topics geared toward better understanding of heart health and risk factor modification. Participant will state understanding/return demonstration of topics presented as noted by education test scores.  Learning Barriers/Preferences: Learning Barriers/Preferences - 12/05/17 1253      Learning Barriers/Preferences   Learning Barriers  Hearing    Learning Preferences  Individual Instruction       Education Topics:  AED/CPR: - Group verbal and written instruction with the use of models to demonstrate the basic use of the AED with the basic ABC's of resuscitation.   General Nutrition Guidelines/Fats and Fiber: -Group instruction provided by verbal, written material, models and posters to present the general guidelines for heart healthy nutrition. Gives an explanation and review of dietary fats and fiber.   Cardiac Rehab from 05/26/2015 in [facility] Cardiac and Pulmonary Rehab  Date  05/19/15  Educator  CR  Instruction Review Code (retired)  2- meets goals/outcomes      Controlling Sodium/Reading Food Labels: -Group verbal and written material supporting the discussion of sodium use in heart healthy nutrition. Review and explanation with models, verbal and written materials for utilization of the food label.   Cardiac Rehab from 05/26/2015 in [facility] Cardiac and Pulmonary Rehab  Date  05/26/15  Educator  c. Joneen Caraway  Instruction Review Code (retired)  2- meets goals/outcomes      Exercise Physiology & General Exercise Guidelines: - Group verbal and written instruction with models to review the exercise physiology of the cardiovascular system and associated critical values. Provides general exercise guidelines with specific guidelines to those with heart or lung disease.     Aerobic Exercise & Resistance Training: - Gives group verbal and written instruction on the various components of exercise. Focuses on aerobic and resistive training programs and the benefits of this training and how to safely progress through these programs..   Cardiac Rehab from 05/26/2015 in [facility] Cardiac and Pulmonary Rehab  Date  04/21/15  Educator  Dorisann Frames  Instruction Review Code (retired)  2- Statistician, Balance, Mind/Body Relaxation: Provides group verbal/written instruction on the benefits of flexibility and balance training, including mind/body exercise modes such as yoga, pilates and tai chi.  Demonstration and skill practice provided.   Stress and Anxiety: - Provides group verbal and written instruction about the health risks of elevated stress and causes of high stress.  Discuss the correlation between heart/lung disease and anxiety and treatment options. Review healthy ways to manage with stress and anxiety.   Cardiac Rehab from 05/26/2015 in [facility] Cardiac and Pulmonary Rehab  Date  04/09/15  Educator  Lucianne Lei, LCSW  Instruction Review Code (retired)  2- meets goals/outcomes      Depression: - Provides group verbal and written instruction on the correlation between heart/lung disease and depressed mood, treatment options, and the stigmas associated with seeking treatment.   Anatomy & Physiology of the Heart: - Group verbal and written instruction and models provide basic cardiac anatomy and physiology, with the coronary electrical and arterial systems. Review of Valvular disease and Heart Failure   Cardiac Rehab from 05/26/2015 in [facility] Cardiac and Pulmonary Rehab  Date  05/05/15  Educator  SB  Instruction Review Code (retired)  2- meets goals/outcomes      Cardiac Procedures: - Group verbal and written instruction to review commonly  prescribed medications for heart disease. Reviews the medication, class of the drug, and side  effects. Includes the steps to properly store meds and maintain the prescription regimen. (beta blockers and nitrates)   Cardiac Medications I: - Group verbal and written instruction to review commonly prescribed medications for heart disease. Reviews the medication, class of the drug, and side effects. Includes the steps to properly store meds and maintain the prescription regimen.   Cardiac Rehab from 12/12/2017 in [facility] Cardiac and Pulmonary Rehab  Date  12/12/17  Educator  [facility]  Instruction Review Code  1- Verbalizes Understanding      Cardiac Medications II: -Group verbal and written instruction to review commonly prescribed medications for heart disease. Reviews the medication, class of the drug, and side effects. (all other drug classes)    Go Sex-Intimacy & Heart Disease, Get SMART - Goal Setting: - Group verbal and written instruction through game format to discuss heart disease and the return to sexual intimacy. Provides group verbal and written material to discuss and apply goal setting through the application of the S.M.A.R.T. Method.   Other Matters of the Heart: - Provides group verbal, written materials and models to describe Stable Angina and Peripheral Artery. Includes description of the disease process and treatment options available to the cardiac patient.   Exercise & Equipment Safety: - Individual verbal instruction and demonstration of equipment use and safety with use of the equipment.   Cardiac Rehab from 12/12/2017 in [facility] Cardiac and Pulmonary Rehab  Date  12/05/17  Educator  [facility]  Instruction Review Code  1- Verbalizes Understanding      Infection Prevention: - Provides verbal and written material to individual with discussion of infection control including proper hand washing and proper equipment cleaning during exercise session.   Cardiac Rehab from 12/12/2017 in [facility] Cardiac and Pulmonary Rehab  Date  12/05/17  Educator  [facility]  Instruction Review Code  1-  Verbalizes Understanding      Falls Prevention: - Provides verbal and written material to individual with discussion of falls prevention and safety.   Cardiac Rehab from 12/12/2017 in [place] Cardiac and Pulmonary Rehab  Date  12/05/17  Educator  [facility]  Instruction Review Code  1- Verbalizes Understanding      Diabetes: - Individual verbal and written instruction to review signs/symptoms of diabetes, desired ranges of glucose level fasting, after meals and with exercise. Acknowledge that pre and post exercise glucose checks will be done for 3 sessions at entry of program.   Cardiac Rehab from 12/12/2017 in [facility] Cardiac and Pulmonary Rehab  Date  12/05/17  Educator  [facility]  Instruction Review Code  1- Verbalizes Understanding      Know Your Numbers and Risk Factors: -Group verbal and written instruction about important numbers in your health.  Discussion of what are risk factors and how they play a role in the disease process.  Review of Cholesterol, Blood Pressure, Diabetes, and BMI and the role they play in your overall health.   Sleep Hygiene: -Provides group verbal and written instruction about how sleep can affect your health.  Define sleep hygiene, discuss sleep cycles and impact of sleep habits. Review good sleep hygiene tips.    Other: -Provides group and verbal instruction on various topics (see comments)   Knowledge Questionnaire Score: Knowledge Questionnaire Score - 12/05/17 1254      Knowledge Questionnaire Score   Pre Score  -- will bring questionnaire first day of class  Core Components/Risk Factors/Patient Goals at Admission: Personal Goals and Risk Factors at Admission - 12/05/17 1212      Core Components/Risk Factors/Patient Goals on Admission    Weight Management  Weight Maintenance;Yes    Intervention  Weight Management: Develop a combined nutrition and exercise program designed to reach desired caloric intake, while maintaining appropriate intake of  nutrient and fiber, sodium and fats, and appropriate energy expenditure required for the weight goal.;Weight Management: Provide education and appropriate resources to help participant work on and attain dietary goals. Bill wishes to stay around 160 lb     Admit Weight  163 lb (73.9 kg)    Goal Weight: Short Term  160 lb (72.6 kg)    Goal Weight: Long Term  158 lb (71.7 kg)    Expected Outcomes  Short Term: Continue to assess and modify interventions until short term weight is achieved;Long Term: Adherence to nutrition and physical activity/exercise program aimed toward attainment of established weight goal;Weight Maintenance: Understanding of the daily nutrition guidelines, which includes 25-35% calories from fat, 7% or less cal from saturated fats, less than '200mg'$  cholesterol, less than 1.5gm of sodium, & 5 or more servings of fruits and vegetables daily;Understanding recommendations for meals to include 15-35% energy as protein, 25-35% energy from fat, 35-60% energy from carbohydrates, less than '200mg'$  of dietary cholesterol, 20-35 gm of total fiber daily;Understanding of distribution of calorie intake throughout the day with the consumption of 4-5 meals/snacks    Diabetes  Yes    Intervention  Provide education about signs/symptoms and action to take for hypo/hyperglycemia.;Provide education about proper nutrition, including hydration, and aerobic/resistive exercise prescription along with prescribed medications to achieve blood glucose in normal ranges: Fasting glucose 65-99 mg/dL    Expected Outcomes  Short Term: Participant verbalizes understanding of the signs/symptoms and immediate care of hyper/hypoglycemia, proper foot care and importance of medication, aerobic/resistive exercise and nutrition plan for blood glucose control.;Long Term: Attainment of HbA1C < 7%.    Heart Failure  Yes    Intervention  Provide a combined exercise and nutrition program that is supplemented with education, support and  counseling about heart failure. Directed toward relieving symptoms such as shortness of breath, decreased exercise tolerance, and extremity edema.    Expected Outcomes  Improve functional capacity of life;Short term: Attendance in program 2-3 days a week with increased exercise capacity. Reported lower sodium intake. Reported increased fruit and vegetable intake. Reports medication compliance.;Short term: Daily weights obtained and reported for increase. Utilizing diuretic protocols set by physician.;Long term: Adoption of self-care skills and reduction of barriers for early signs and symptoms recognition and intervention leading to self-care maintenance.    Hypertension  Yes    Intervention  Provide education on lifestyle modifcations including regular physical activity/exercise, weight management, moderate sodium restriction and increased consumption of fresh fruit, vegetables, and low fat dairy, alcohol moderation, and smoking cessation.;Monitor prescription use compliance.    Expected Outcomes  Short Term: Continued assessment and intervention until BP is < 140/21m HG in hypertensive participants. < 130/841mHG in hypertensive participants with diabetes, heart failure or chronic kidney disease.;Long Term: Maintenance of blood pressure at goal levels.    Lipids  Yes    Intervention  Provide education and support for participant on nutrition & aerobic/resistive exercise along with prescribed medications to achieve LDL '70mg'$ , HDL >'40mg'$ .    Expected Outcomes  Short Term: Participant states understanding of desired cholesterol values and is compliant with medications prescribed. Participant is following exercise prescription and  nutrition guidelines.;Long Term: Cholesterol controlled with medications as prescribed, with individualized exercise RX and with personalized nutrition plan. Value goals: LDL < 73m, HDL > 40 mg.    Stress  Yes Wife of 61 years is starting to "lose memory" and he doesn't know why. He  also has bone cancer which he receives chemo weekly for.     Intervention  Offer individual and/or small group education and counseling on adjustment to heart disease, stress management and health-related lifestyle change. Teach and support self-help strategies.;Refer participants experiencing significant psychosocial distress to appropriate mental health specialists for further evaluation and treatment. When possible, include family members and significant others in education/counseling sessions.    Expected Outcomes  Short Term: Participant demonstrates changes in health-related behavior, relaxation and other stress management skills, ability to obtain effective social support, and compliance with psychotropic medications if prescribed.;Long Term: Emotional wellbeing is indicated by absence of clinically significant psychosocial distress or social isolation.       Core Components/Risk Factors/Patient Goals Review:    Core Components/Risk Factors/Patient Goals at Discharge (Final Review):    ITP Comments: ITP Comments    Row Name 12/05/17 1202 12/14/17 1545 12/21/17 1602 12/28/17 0644     ITP Comments  Med review completed. Initial ITP created. [facility]  Bill called to let uKoreaknow that he has not been feeling since Monday. He does not think he over did it, but wants to wait to return on Monday.   Mr. TKlughhas been out since last Monday.  He doesn't think that it was his exercise.  He is also getting chemo treatments on Thursdays.  He is having a hard time getting around due to back pain and would like to rest for a few weeks while he thinks more about the program.  We will follow up with him again on the week of Feb 4th.   30 Day review. Continue with ITP unless directed changes per Medical Director review.  New to program       Comments: discharge ITP

## 2018-01-18 NOTE — Care Management Important Message (Signed)
Important Message  Patient Details  Name: Dwayne Terry MRN: [id number] Date of Birth: Feb 24, 1930   Medicare Important Message Given:  N/A - LOS <3 / Initial given by admissions    Katrina Stack, RN 01/18/2018, 3:40 PM

## 2018-01-18 NOTE — Care Management (Signed)
Patient to discharge home with home health services.  His orthostasis is much improved.  Spoke with patient, wife and daughter.  Agency preference is [facility] and referral accepted for SN PT OT Aide and SW.  Obtained order for hospital bed.  Patient has a walker and BSC. Daughter transports patient weekly to [facility] for [place] for Hospital Bed  Patient has CHF This requires the head of the bed to be positioned in ways not feasible with a normal bed. He also has body pain from malignancy which makes it difficult for him to change position. Head must be elevated at least 30 degrees    A wedge will not elevate the head of the bed enough to alleviate or prevent sx

## 2018-01-18 NOTE — Progress Notes (Signed)
Physical Therapy Treatment Patient Details Name: Dwayne Terry MRN: [id number] DOB: November 22, 1930 Today's Date: 01/18/2018    History of Present Illness Pt is an87 y.o.malewith a known history of multiple myeloma, prostate cancer, diabetes mellitus type 2, hyperlipidemia presents to ED with dizziness. Patient had couple of instances of syncopal events at home resulting in falls and RLE pain. Pt was discharged from hospital 2 days ago with higher dose of Lasix. Blood pressure was low here 97/48. Patient has no headache. No other complaints like shortness of breath.  Assessment includes:  Syncope with loss of consciousness secondary to dehydration, acute kidney injury secondary to overdiuresis, chronic CHF with EF 30%, DM II, multiple myeloma, and deconditioning.     PT Comments    Pt presents with minor deficits in strength, transfers, mobility, gait, balance, and activity tolerance.  Orthostatic BP taken in sitting and standing only secondary to pt found in recliner.  SpO2 and HR WNL throughout with SpO2 >/= 98% and HR low 60s at rest and low 70s after amb.  BP in sitting 116/61 mmHg, BP in standing after 1 min 100/62 mmHg, in standing after 3 min 110/60 mmHg, after amb 200' 129/63 mmHg with no adverse symptoms at any time during session.  Pt will benefit from HHPT services upon discharge to safely address above deficits for decreased caregiver assistance and eventual return to PLOF.     Follow Up Recommendations  Home health PT     Equipment Recommendations  None recommended by PT    Recommendations for Other Services       Precautions / Restrictions Precautions Precautions: Fall Restrictions Weight Bearing Restrictions: No    Mobility  Bed Mobility Overal bed mobility: Modified Independent             General bed mobility comments: Extra time and effort with sup to/from sit but no physical assistance required  Transfers Overall transfer level: Needs  assistance Equipment used: Rolling walker (2 wheeled) Transfers: Sit to/from Stand Sit to Stand: Supervision         General transfer comment: Good stability and control with transfers  Ambulation/Gait Ambulation/Gait assistance: Supervision Ambulation Distance (Feet): 200 Feet Assistive device: Rolling walker (2 wheeled) Gait Pattern/deviations: Step-through pattern;Decreased step length - right;Decreased step length - left   Gait velocity interpretation: Below normal speed for age/gender General Gait Details: Slow cadence with short B step length but steady without LOB   Stairs            Wheelchair Mobility    Modified Rankin (Stroke Patients Only)       Balance Overall balance assessment: Needs assistance Sitting-balance support: Bilateral upper extremity supported;Feet supported Sitting balance-Leahy Scale: Good     Standing balance support: Bilateral upper extremity supported Standing balance-Leahy Scale: Good                              Cognition Arousal/Alertness: Awake/alert Behavior During Therapy: WFL for tasks assessed/performed Overall Cognitive Status: Within Functional Limits for tasks assessed                                        Exercises Total Joint Exercises Ankle Circles/Pumps: AROM;Both;5 reps;10 reps Towel Squeeze: Strengthening;Both;10 reps Long Arc Quad: AROM;Both;10 reps;15 reps Knee Flexion: AROM;Both;10 reps;15 reps Marching in Standing: AROM;Both;5 reps;10 reps Other Exercises Other Exercises: Standing B  heel raises x 10    General Comments General comments (skin integrity, edema, etc.): HR/O2 sats stable t/o session (HR 68-74, O2 on RA 97-100%), BP supine 108/53, sitting EOB <109mn 92/45, sitting 220m 111/53, standing <64m45m101/58, standing 2mi40m15/56, sitting in recliner after brief ambulation in room 123/60      Pertinent Vitals/Pain Pain Assessment: No/denies pain    Home Living  Family/patient expects to be discharged to:: Private residence Living Arrangements: Spouse/significant other Available Help at Discharge: Family;Friend(s);Available 24 hours/day Type of Home: House Home Access: Stairs to enter Entrance Stairs-Rails: Right;Left;Can reach both Home Layout: One level Home Equipment: WalkEnvironmental consultant wheels;Walker - 4 wheels;Cane - single point;Shower seat - built in;Hand held shower head      Prior Function Level of Independence: Independent with assistive device(s)      Comments: Mod Ind amb with rollator limited community distances, Ind with ADLs, no other fall history other than current falls leading to this admission   PT Goals (current goals can now be found in the care plan section) Acute Rehab PT Goals Patient Stated Goal: "To be strong enough to take care of myself" Progress towards PT goals: Progressing toward goals    Frequency    Min 2X/week      PT Plan Discharge plan needs to be updated;Current plan remains appropriate    Co-evaluation              AM-PAC PT "6 Clicks" Daily Activity  Outcome Measure                   End of Session Equipment Utilized During Treatment: Gait belt Activity Tolerance: Patient tolerated treatment well Patient left: in bed;with call bell/phone within reach;with bed alarm set;with family/visitor present Nurse Communication: Mobility status PT Visit Diagnosis: Muscle weakness (generalized) (M62.81);Difficulty in walking, not elsewhere classified (R26.2)     Time: [id number] Calculation (min) (ACUTE ONLY): 27 min  Charges:  $Gait Training: 8-22 mins $Therapeutic Exercise: 8-22 mins                    G Codes:       D. ScRoyetta Asal DPT 01/18/18, 2:48 PM

## 2018-01-18 NOTE — Evaluation (Signed)
Occupational Therapy Evaluation Patient Details Name: Dwayne Terry MRN: [id number] DOB: 20-Feb-1930 Today's Date: 01/18/2018    History of Present Illness Pt is an87 y.o.malewith a known history of multiple myeloma, prostate cancer, diabetes mellitus type 2, hyperlipidemia presents to ED with dizziness. Patient had couple of instances of syncopal events at home resulting in falls and RLE pain. Pt was discharged from hospital 2 days ago with higher dose of Lasix. Blood pressure was low here 97/48. Patient has no headache. No other complaints like shortness of breath.  Assessment includes:  Syncope with loss of consciousness secondary to dehydration, acute kidney injury secondary to overdiuresis, chronic CHF with EF 30%, DM II, multiple myeloma, and deconditioning.    Clinical Impression   Pt seen for OT evaluation this date. Pt was modified independent with rollator community distances, + driving, indep with basic ADL tasks. Pt resting in bed upon OT's arrival, endorsing feeling "a little whoozy" in bed. Orthostatics taken based on BP concerns previous date: HR/O2 sats stable t/o session (HR 68-74, O2 on RA 97-100%), BP supine 108/53, sitting EOB <43mn 92/45, sitting 278m 111/53, standing <53m7m101/58, standing 2mi35m15/56, sitting in recliner after brief ambulation in room 123/60. Pt noted feeling "whoozy" which didn't change with supine/sitting/standing. RN notified of vitals with mobility. Pt able to ambulate with min guard using RW. Pt near baseline for ADL tasks. Pt/spouse educated in home/routines modifications and falls prevention strategies to minimize risk of orthostatic hypotension. Pt/spouse verbalized understanding of all education/training provided. Pt will benefit from skilled OT services to address noted impairments and functional deficits while in the hospital. Do not anticipate any OT needs at discharge.     Follow Up Recommendations  No OT follow up    Equipment  Recommendations  None recommended by OT    Recommendations for Other Services       Precautions / Restrictions Precautions Precautions: Fall Restrictions Weight Bearing Restrictions: No      Mobility Bed Mobility Overal bed mobility: Modified Independent             General bed mobility comments: extra time, no LOB   Transfers Overall transfer level: Needs assistance Equipment used: Rolling walker (2 wheeled) Transfers: Sit to/from Stand Sit to Stand: Min guard         General transfer comment: initial VC for hand placement EOB to improve transfer, increased effort to perform    Balance Overall balance assessment: Needs assistance Sitting-balance support: Bilateral upper extremity supported;Feet supported Sitting balance-Leahy Scale: Good     Standing balance support: Bilateral upper extremity supported Standing balance-Leahy Scale: Good                             ADL either performed or assessed with clinical judgement   ADL Overall ADL's : Needs assistance/impaired             Lower Body Bathing: Sit to/from stand;Supervison/ safety       Lower Body Dressing: Sit to/from stand;Supervision/safety   Toilet Transfer: RW;Min guard;Ambulation;Comfort height toilet           Functional mobility during ADLs: Min guard;Rolling walker General ADL Comments: supervision to min guard level for safety due to pt endorsing feeling "a little whoozy" with mobility and BP drops (not as significantly as previous date during PT evaluation)     Vision Baseline Vision/History: Wears glasses Wears Glasses: Reading only Patient Visual Report: No change from baseline  Perception     Praxis      Pertinent Vitals/Pain Pain Assessment: No/denies pain     Hand Dominance Right   Extremity/Trunk Assessment Upper Extremity Assessment Upper Extremity Assessment: Generalized weakness   Lower Extremity Assessment Lower Extremity Assessment:  Generalized weakness   Cervical / Trunk Assessment Cervical / Trunk Assessment: Normal   Communication Communication Communication: No difficulties   Cognition Arousal/Alertness: Awake/alert Behavior During Therapy: WFL for tasks assessed/performed Overall Cognitive Status: Within Functional Limits for tasks assessed                                     General Comments  HR/O2 sats stable t/o session (HR 68-74, O2 on RA 97-100%), BP supine 108/53, sitting EOB <35mn 92/45, sitting 227m 111/53, standing <41m40m101/58, standing 2mi96m15/56, sitting in recliner after brief ambulation in room 123/60    Exercises Other Exercises Other Exercises: Pt/spouse instructed in falls prevention and home/routines modifications to minimize risk of orthostatic hypotension   Shoulder Instructions      Home Living Family/patient expects to be discharged to:: Private residence Living Arrangements: Spouse/significant other Available Help at Discharge: Family;Friend(s);Available 24 hours/day Type of Home: House Home Access: Stairs to enter EntrCenterPoint EnergySteps: 1 Entrance Stairs-Rails: Right;Left;Can reach both Home Layout: One level     Bathroom Shower/Tub: WalkOccupational psychologistndicapped height     Home Equipment: WalkEnvironmental consultant wheels;Walker - 4 wheels;Cane - single point;Shower seat - built in;Hand held shower head          Prior Functioning/Environment Level of Independence: Independent with assistive device(s)        Comments: Mod Ind amb with rollator limited community distances, Ind with ADLs, no other fall history other than current falls leading to this admission        OT Problem List: Decreased strength;Cardiopulmonary status limiting activity;Decreased activity tolerance      OT Treatment/Interventions: Self-care/ADL training;Balance training;Therapeutic exercise;Therapeutic activities;DME and/or AE instruction;Patient/family education     OT Goals(Current goals can be found in the care plan section) Acute Rehab OT Goals Patient Stated Goal: "To be strong enough to take care of myself" OT Goal Formulation: With patient Time For Goal Achievement: 02/01/18 Potential to Achieve Goals: Good ADL Goals Pt Will Transfer to Toilet: with supervision;ambulating(LRAD for amb, comfort height toilet, stable BP)  OT Frequency: Min 1X/week   Barriers to D/C:            Co-evaluation              AM-PAC PT "6 Clicks" Daily Activity     Outcome Measure Help from another person eating meals?: None Help from another person taking care of personal grooming?: None Help from another person toileting, which includes using toliet, bedpan, or urinal?: A Little Help from another person bathing (including washing, rinsing, drying)?: A Little Help from another person to put on and taking off regular upper body clothing?: None Help from another person to put on and taking off regular lower body clothing?: A Little 6 Click Score: 21   End of Session Equipment Utilized During Treatment: Gait belt;Rolling walker Nurse Communication: Mobility status  Activity Tolerance: Patient tolerated treatment well Patient left: in chair;with call bell/phone within reach;with chair alarm set;with family/visitor present  OT Visit Diagnosis: Other abnormalities of gait and mobility (R26.89);Muscle weakness (generalized) (M62.81);History of falling (Z91.81)  Time: 6773-7366 OT Time Calculation (min): 47 min Charges:  OT General Charges $OT Visit: 1 Visit OT Evaluation $OT Eval Low Complexity: 1 Low OT Treatments $Self Care/Home Management : 23-37 mins  Jeni Salles, MPH, MS, OTR/L ascom [phone] 01/18/18, 12:06 PM

## 2018-01-18 NOTE — Progress Notes (Signed)
Discharge instructions explained to pt and pts spouse/ verbalized an understanding/ iv and tele removed/ home health set up/ will transport off unit via wheelchair.

## 2018-01-18 NOTE — Progress Notes (Signed)
Patient feels much better, orthostatic hypotension also improved.  Patient should not be on Lasix for at least another 3 days and then resume after 3 days to help with symptoms of dizziness, orthostatic hypotension.  Discharge instructions are in the computer, discussed with patient about medicines.  Patient can go home with home physical therapy.  Instructions are in the computer.

## 2018-01-18 NOTE — Discharge Instructions (Signed)
Hold lasix for 3 days and resume at lasix 20 mg daily after

## 2018-04-07 ENCOUNTER — Emergency Department: Payer: Medicare HMO

## 2018-04-07 ENCOUNTER — Observation Stay
Admission: EM | Admit: 2018-04-07 | Discharge: 2018-04-08 | Disposition: A | Discharge status: Home / Self Care | Payer: Medicare HMO | Attending: Family Medicine | Admitting: Family Medicine

## 2018-04-07 ENCOUNTER — Other Ambulatory Visit: Payer: Self-pay

## 2018-04-07 ENCOUNTER — Observation Stay
Admit: 2018-04-07 | Discharge: 2018-04-07 | Disposition: A | Discharge status: Home / Self Care | Payer: Medicare HMO | Attending: Internal Medicine | Admitting: Internal Medicine

## 2018-04-07 DIAGNOSIS — Z7902 Long term (current) use of antithrombotics/antiplatelets: Secondary | ICD-10-CM | POA: Diagnosis not present

## 2018-04-07 DIAGNOSIS — I447 Left bundle-branch block, unspecified: Secondary | ICD-10-CM | POA: Insufficient documentation

## 2018-04-07 DIAGNOSIS — Z8546 Personal history of malignant neoplasm of prostate: Secondary | ICD-10-CM | POA: Diagnosis not present

## 2018-04-07 DIAGNOSIS — E119 Type 2 diabetes mellitus without complications: Secondary | ICD-10-CM | POA: Diagnosis not present

## 2018-04-07 DIAGNOSIS — Z952 Presence of prosthetic heart valve: Secondary | ICD-10-CM | POA: Diagnosis not present

## 2018-04-07 DIAGNOSIS — E782 Mixed hyperlipidemia: Secondary | ICD-10-CM | POA: Insufficient documentation

## 2018-04-07 DIAGNOSIS — I2510 Atherosclerotic heart disease of native coronary artery without angina pectoris: Secondary | ICD-10-CM | POA: Diagnosis not present

## 2018-04-07 DIAGNOSIS — R079 Chest pain, unspecified: Principal | ICD-10-CM | POA: Insufficient documentation

## 2018-04-07 DIAGNOSIS — Z87891 Personal history of nicotine dependence: Secondary | ICD-10-CM | POA: Insufficient documentation

## 2018-04-07 DIAGNOSIS — Z7982 Long term (current) use of aspirin: Secondary | ICD-10-CM | POA: Diagnosis not present

## 2018-04-07 DIAGNOSIS — I10 Essential (primary) hypertension: Secondary | ICD-10-CM | POA: Insufficient documentation

## 2018-04-07 DIAGNOSIS — Z79899 Other long term (current) drug therapy: Secondary | ICD-10-CM | POA: Diagnosis not present

## 2018-04-07 DIAGNOSIS — Z8579 Personal history of other malignant neoplasms of lymphoid, hematopoietic and related tissues: Secondary | ICD-10-CM | POA: Insufficient documentation

## 2018-04-07 DIAGNOSIS — Z955 Presence of coronary angioplasty implant and graft: Secondary | ICD-10-CM | POA: Diagnosis not present

## 2018-04-07 DIAGNOSIS — Z7984 Long term (current) use of oral hypoglycemic drugs: Secondary | ICD-10-CM | POA: Diagnosis not present

## 2018-04-07 DIAGNOSIS — I252 Old myocardial infarction: Secondary | ICD-10-CM | POA: Diagnosis not present

## 2018-04-07 LAB — COMPREHENSIVE METABOLIC PANEL
ALK PHOS: 61 U/L (ref 38–126)
ALT: 27 U/L (ref 17–63)
ANION GAP: 8 (ref 5–15)
AST: 35 U/L (ref 15–41)
Albumin: 3.5 g/dL (ref 3.5–5.0)
BUN: 14 mg/dL (ref 6–20)
CALCIUM: 9 mg/dL (ref 8.9–10.3)
CHLORIDE: 106 mmol/L (ref 101–111)
CO2: 21 mmol/L — AB (ref 22–32)
CREATININE: 1.18 mg/dL (ref 0.61–1.24)
GFR, EST NON AFRICAN AMERICAN: 54 mL/min — AB (ref >60)
Glucose, Bld: 127 mg/dL — ABNORMAL HIGH (ref 65–99)
Potassium: 4 mmol/L (ref 3.5–5.1)
SODIUM: 135 mmol/L (ref 135–145)
Total Bilirubin: 0.7 mg/dL (ref 0.3–1.2)
Total Protein: 7.6 g/dL (ref 6.5–8.1)

## 2018-04-07 LAB — CBC WITH DIFFERENTIAL/PLATELET
Basophils Absolute: 0 10*3/uL (ref 0–0.1)
Basophils Relative: 0 %
EOS ABS: 0 10*3/uL (ref 0–0.7)
EOS PCT: 1 %
HCT: 33.5 % — ABNORMAL LOW (ref 40.0–52.0)
Hemoglobin: 11.3 g/dL — ABNORMAL LOW (ref 13.0–18.0)
LYMPHS ABS: 0.6 10*3/uL — AB (ref 1.0–3.6)
LYMPHS PCT: 7 %
MCH: 31.2 pg (ref 26.0–34.0)
MCHC: 33.9 g/dL (ref 32.0–36.0)
MCV: 92.1 fL (ref 80.0–100.0)
MONOS PCT: 7 %
Monocytes Absolute: 0.6 10*3/uL (ref 0.2–1.0)
Neutro Abs: 7 10*3/uL — ABNORMAL HIGH (ref 1.4–6.5)
Neutrophils Relative %: 85 %
PLATELETS: 183 10*3/uL (ref 150–440)
RBC: 3.64 MIL/uL — ABNORMAL LOW (ref 4.40–5.90)
RDW: 17.8 % — ABNORMAL HIGH (ref 11.5–14.5)
WBC: 8.2 10*3/uL (ref 3.8–10.6)

## 2018-04-07 LAB — TROPONIN I

## 2018-04-07 MED ORDER — ASPIRIN EC 81 MG PO TBEC
81.0000 mg | DELAYED_RELEASE_TABLET | Freq: Every day | ORAL | Status: DC
  Administered 2018-04-08: 81 mg via ORAL
  Filled 2018-04-07: qty 1

## 2018-04-07 MED ORDER — PRAVASTATIN SODIUM 40 MG PO TABS
40.0000 mg | ORAL_TABLET | Freq: Every day | ORAL | Status: DC
Start: 2018-04-07 — End: 2018-04-08
  Administered 2018-04-07: 40 mg via ORAL
  Filled 2018-04-07: qty 1

## 2018-04-07 MED ORDER — ENOXAPARIN SODIUM 40 MG/0.4ML ~~LOC~~ SOLN
40.0000 mg | SUBCUTANEOUS | Status: DC
  Administered 2018-04-07: 40 mg via SUBCUTANEOUS
  Filled 2018-04-07: qty 0.4

## 2018-04-07 MED ORDER — ALPRAZOLAM 0.5 MG PO TABS: 0.2500 mg | ORAL_TABLET | Freq: Two times a day (BID) | ORAL | Status: DC | PRN

## 2018-04-07 MED ORDER — NITROGLYCERIN 0.4 MG SL SUBL: 0.4000 mg | SUBLINGUAL_TABLET | SUBLINGUAL | Status: DC | PRN

## 2018-04-07 MED ORDER — SODIUM CHLORIDE 0.9% FLUSH
3.0000 mL | Freq: Two times a day (BID) | INTRAVENOUS | Status: DC
  Administered 2018-04-07: 3 mL via INTRAVENOUS

## 2018-04-07 MED ORDER — PANTOPRAZOLE SODIUM 20 MG PO TBEC
20.0000 mg | DELAYED_RELEASE_TABLET | Freq: Every day | ORAL | Status: DC
  Filled 2018-04-07: qty 1

## 2018-04-07 MED ORDER — SODIUM CHLORIDE 0.9 % IV SOLN: 250.0000 mL | INTRAVENOUS | Status: DC | PRN

## 2018-04-07 MED ORDER — FINASTERIDE 5 MG PO TABS
2.5000 mg | ORAL_TABLET | Freq: Every day | ORAL | Status: DC
  Administered 2018-04-08: 2.5 mg via ORAL
  Filled 2018-04-07: qty 0.5
  Filled 2018-04-07: qty 1

## 2018-04-07 MED ORDER — LISINOPRIL 5 MG PO TABS
2.5000 mg | ORAL_TABLET | Freq: Every day | ORAL | Status: DC
  Administered 2018-04-08: 2.5 mg via ORAL
  Filled 2018-04-07: qty 1

## 2018-04-07 MED ORDER — ASPIRIN 300 MG RE SUPP: 300.0000 mg | RECTAL | Status: AC

## 2018-04-07 MED ORDER — SILVER SULFADIAZINE 1 % EX CREA
TOPICAL_CREAM | Freq: Every day | CUTANEOUS | Status: DC
  Administered 2018-04-08: 10:00:00 via TOPICAL
  Filled 2018-04-07: qty 85

## 2018-04-07 MED ORDER — TICAGRELOR 90 MG PO TABS
90.0000 mg | ORAL_TABLET | Freq: Two times a day (BID) | ORAL | Status: DC
  Administered 2018-04-07 – 2018-04-08 (×2): 90 mg via ORAL
  Filled 2018-04-07 (×2): qty 1

## 2018-04-07 MED ORDER — POTASSIUM CHLORIDE CRYS ER 20 MEQ PO TBCR
20.0000 meq | EXTENDED_RELEASE_TABLET | Freq: Every day | ORAL | Status: DC
  Administered 2018-04-08: 20 meq via ORAL
  Filled 2018-04-07: qty 1

## 2018-04-07 MED ORDER — SODIUM CHLORIDE 0.9% FLUSH: 3.0000 mL | INTRAVENOUS | Status: DC | PRN

## 2018-04-07 MED ORDER — DEXAMETHASONE 4 MG PO TABS: 8.5000 mg | ORAL_TABLET | ORAL | Status: DC

## 2018-04-07 MED ORDER — ACETAMINOPHEN 325 MG PO TABS: 650.0000 mg | ORAL_TABLET | ORAL | Status: DC | PRN

## 2018-04-07 MED ORDER — ASPIRIN EC 81 MG PO TBEC: 81.0000 mg | DELAYED_RELEASE_TABLET | Freq: Every day | ORAL | Status: DC

## 2018-04-07 MED ORDER — ACYCLOVIR 200 MG PO CAPS
200.0000 mg | ORAL_CAPSULE | Freq: Two times a day (BID) | ORAL | Status: DC
  Administered 2018-04-07 – 2018-04-08 (×2): 200 mg via ORAL
  Filled 2018-04-07 (×3): qty 1

## 2018-04-07 MED ORDER — CARVEDILOL 6.25 MG PO TABS
3.1250 mg | ORAL_TABLET | Freq: Two times a day (BID) | ORAL | Status: DC
  Administered 2018-04-08: 3.125 mg via ORAL
  Filled 2018-04-07 (×2): qty 1

## 2018-04-07 MED ORDER — ASPIRIN 81 MG PO CHEW
324.0000 mg | CHEWABLE_TABLET | ORAL | Status: AC
  Administered 2018-04-07: 324 mg via ORAL
  Filled 2018-04-07: qty 4

## 2018-04-07 MED ORDER — CLOPIDOGREL BISULFATE 75 MG PO TABS: 75.0000 mg | ORAL_TABLET | Freq: Every day | ORAL | Status: DC

## 2018-04-07 MED ORDER — ONDANSETRON HCL 4 MG/2ML IJ SOLN: 4.0000 mg | Freq: Four times a day (QID) | INTRAMUSCULAR | Status: DC | PRN

## 2018-04-07 MED ORDER — ADULT MULTIVITAMIN W/MINERALS CH
1.0000 | ORAL_TABLET | Freq: Every day | ORAL | Status: DC
  Administered 2018-04-08: 1 via ORAL
  Filled 2018-04-07 (×2): qty 1

## 2018-04-07 NOTE — Progress Notes (Signed)
Pt scheduled to have CT Angio, but did not have a 20g PIV in place. SWOT RN and IV team attempted, but neither were able to get one in. Notified CT and MD Pyreddy; MD said okay to cancel CT scan. Earleen Reaper, RN

## 2018-04-07 NOTE — H&P (Addendum)
[facility] [place] NAME: Dwayne Terry    MR#:  [id number]  DATE OF BIRTH:  Feb 21, 1930  DATE OF ADMISSION:  04/07/2018  PRIMARY CARE PHYSICIAN: Kirk Ruths, MD   REQUESTING/REFERRING PHYSICIAN:   CHIEF COMPLAINT:   Chief Complaint  Patient presents with  . Chest Pain    HISTORY OF PRESENT ILLNESS: Dwayne Terry  is a 84 y.o. male with a known history of CAD, multiple cardiac stents, myeloma presented to ER with chest pain. Pain started at 9am this morning. Pain is sharp in nature.It was 6 out of 10 on a scale o 1 to 10.Patient recently had stents placed two weeks ago and has been compliant on meds.No shortness of breath, orthopnea. Troponin negative in ER. EKG reviewed shows old LBBB.  PAST MEDICAL HISTORY:   Past Medical History:  Diagnosis Date  . Cancer ([place])    bone marrow, prostate cancer  . Diabetes mellitus without complication ([place])   . Hyperlipidemia   . MI (mitral incompetence)   . MI (myocardial infarction) ([place])     PAST SURGICAL HISTORY:  Past Surgical History:  Procedure Laterality Date  . AORTIC VALVE REPLACEMENT (AVR)/CORONARY ARTERY BYPASS GRAFTING (CABG)    . CORONARY ANGIOPLASTY WITH STENT PLACEMENT    . CORONARY BALLOON ANGIOPLASTY N/A 06/10/2017   Procedure: Coronary Balloon Angioplasty;  Surgeon: Wellington Hampshire, MD;  Location: [place] [facility];  Service: Cardiovascular;  Laterality: N/A;  . CORONARY STENT INTERVENTION N/A 01/28/2017   Procedure: Coronary Stent Intervention;  Surgeon: [name], MD;  Location: [place] [facility];  Service: Cardiovascular;  Laterality: N/A;  . CORONARY STENT INTERVENTION N/A 04/29/2017   Procedure: Coronary Stent Intervention;  Surgeon: [name], MD;  Location: [place] [facility];  Service: Cardiovascular;  Laterality: N/A;  . CORONARY STENT INTERVENTION N/A 09/19/2017   Procedure: CORONARY/GRAFT ANGIOGRAPHY;  Surgeon: Corey Skains, MD;  Location: [place] [facility];  Service: Cardiovascular;  Laterality: N/A;  . CORONARY STENT INTERVENTION N/A 09/19/2017   Procedure: CORONARY STENT INTERVENTION;  Surgeon: Wellington Hampshire, MD;  Location: [place] [facility];  Service: Cardiovascular;  Laterality: N/A;  . LEFT HEART CATH AND CORONARY ANGIOGRAPHY N/A 01/28/2017   Procedure: Left Heart Cath and Coronary Angiography;  Surgeon: [name], MD;  Location: [place] [facility];  Service: Cardiovascular;  Laterality: N/A;  . LEFT HEART CATH AND CORONARY ANGIOGRAPHY N/A 10/31/2017   Procedure: LEFT HEART CATH AND CORONARY ANGIOGRAPHY;  Surgeon: Teodoro Spray, MD;  Location: [place] [facility];  Service: Cardiovascular;  Laterality: N/A;  . LEFT HEART CATH AND CORS/GRAFTS ANGIOGRAPHY N/A 04/29/2017   Procedure: Left Heart Cath and Cors/Grafts Angiography;  Surgeon: [name], MD;  Location: [place] [facility];  Service: Cardiovascular;  Laterality: N/A;  . LEFT HEART CATH AND CORS/GRAFTS ANGIOGRAPHY N/A 06/10/2017   Procedure: Left Heart Cath and Cors/Grafts Angiography;  Surgeon: Corey Skains, MD;  Location: [place] [facility];  Service: Cardiovascular;  Laterality: N/A;  . LEFT HEART CATH AND CORS/GRAFTS ANGIOGRAPHY N/A 09/19/2017   Procedure: LEFT HEART CATH;  Surgeon: Corey Skains, MD;  Location: [place] [facility];  Service: Cardiovascular;  Laterality: N/A;    SOCIAL HISTORY:  Social History   Tobacco Use  . Smoking status: Former Smoker    Last attempt to quit: 2015    Years since quitting: 4.3  . Smokeless tobacco: Former Network engineer Use Topics  .  Alcohol use: No    FAMILY HISTORY:  Family History  Problem Relation Age of Onset  . Heart disease Other   . Heart disease Father     DRUG ALLERGIES:  Allergies  Allergen Reactions  . Antihistamines, Chlorpheniramine-Type Other (See Comments)    Reaction: unknown  . Nitroglycerin Other (See Comments)    Reaction:  hypotension    REVIEW OF SYSTEMS:   CONSTITUTIONAL: No fever, fatigue or weakness.  EYES: No blurred or double vision.  EARS, NOSE, AND THROAT: No tinnitus or ear pain.  RESPIRATORY: No cough, shortness of breath, wheezing or hemoptysis.  CARDIOVASCULAR: Had chest pain, orthopnea, edema.  GASTROINTESTINAL: No nausea, vomiting, diarrhea or abdominal pain.  GENITOURINARY: No dysuria, hematuria.  ENDOCRINE: No polyuria, nocturia,  HEMATOLOGY: No anemia, easy bruising or bleeding SKIN: No rash or lesion. MUSCULOSKELETAL: No joint pain or arthritis.   NEUROLOGIC: No tingling, numbness, weakness.  PSYCHIATRY: No anxiety or depression.   MEDICATIONS AT HOME:  Prior to Admission medications   Medication Sig Start Date End Date Taking? Authorizing Provider  acyclovir (ZOVIRAX) 200 MG capsule Take 200 mg by mouth 2 (two) times daily.    [provider]  ALPRAZolam Duanne Moron) 0.25 MG tablet Take 1 tablet (0.25 mg total) by mouth 2 (two) times daily as needed for anxiety. 01/14/18   Vaughan Basta, MD  aspirin EC 81 MG tablet Take 1 tablet (81 mg total) by mouth daily. 01/29/17   Loletha Grayer, MD  carvedilol (COREG) 3.125 MG tablet Take 1 tablet (3.125 mg total) by mouth 2 (two) times daily with a meal. 12/31/17   Epifanio Lesches, MD  clopidogrel (PLAVIX) 75 MG tablet Take 1 tablet (75 mg total) by mouth daily with breakfast. 04/30/17   Henreitta Leber, MD  dexamethasone (DECADRON) 2 MG tablet Take 8.5 mg by mouth See admin instructions. Take 8.5 mg on Thursday and Friday.    [provider]  finasteride (PROSCAR) 5 MG tablet Take 2.5 mg by mouth daily.     [provider]  lansoprazole (PREVACID) 30 MG capsule Take 30 mg by mouth 2 (two) times daily as needed.  11/03/17 11/03/18  [provider]  lisinopril (ZESTRIL) 2.5 MG tablet Take 1 tablet (2.5 mg total) by mouth daily. 09/20/17 09/20/18  Dustin Flock, MD  lovastatin (MEVACOR) 20 MG tablet Take 20  mg by mouth daily at 6 PM.    [provider]  metFORMIN (GLUCOPHAGE) 500 MG tablet Take 500 mg by mouth 2 (two) times daily with a meal.    [provider]  Multiple Vitamin (MULTI-VITAMINS) TABS Take 1 tablet by mouth daily.    [provider]  potassium chloride SA (K-DUR,KLOR-CON) 20 MEQ tablet Take 1 tablet (20 mEq total) by mouth daily. 09/20/17   Dustin Flock, MD  silver sulfADIAZINE (SILVADENE) 1 % cream Apply to affected area daily 08/03/17 08/03/18  Duanne Guess, PA-C      PHYSICAL EXAMINATION:   VITAL SIGNS: Blood pressure 128/70, pulse 75, temperature 98.4 F (36.9 C), temperature source Oral, resp. rate 20, SpO2 100 %.  GENERAL:  84 y.o.-year-old patient lying in the bed with no acute distress.  EYES: Pupils equal, round, reactive to light and accommodation. No scleral icterus. Extraocular muscles intact.  HEENT: Head atraumatic, normocephalic. Oropharynx and nasopharynx clear.  NECK:  Supple, no jugular venous distention. No thyroid enlargement, no tenderness.  LUNGS: Normal breath sounds bilaterally, no wheezing, rales,rhonchi or crepitation. No use of accessory muscles of respiration.  CARDIOVASCULAR: S1, S2 normal. No murmurs, rubs, or gallops.  ABDOMEN: Soft, nontender, nondistended. Bowel sounds present. No organomegaly or mass.  EXTREMITIES: No pedal edema, cyanosis, or clubbing.  NEUROLOGIC: Cranial nerves II through XII are intact. Muscle strength 5/5 in all extremities. Sensation intact. Gait not checked.  PSYCHIATRIC: The patient is alert and oriented x 3.  SKIN: No obvious rash, lesion, or ulcer.   LABORATORY PANEL:   CBC Recent Labs  Lab 04/07/18 1329  WBC 8.2  HGB 11.3*  HCT 33.5*  PLT 183  MCV 92.1  MCH 31.2  MCHC 33.9  RDW 17.8*  LYMPHSABS 0.6*  MONOABS 0.6  EOSABS 0.0  BASOSABS 0.0   ------------------------------------------------------------------------------------------------------------------  Chemistries   Recent Labs  Lab 04/07/18 1359  NA 135  K 4.0  CL 106  CO2 21*  GLUCOSE 127*  BUN 14  CREATININE 1.18  CALCIUM 9.0  AST 35  ALT 27  ALKPHOS 61  BILITOT 0.7   ------------------------------------------------------------------------------------------------------------------ CrCl cannot be calculated (Unknown ideal weight.). ------------------------------------------------------------------------------------------------------------------ No results for input(s): TSH, T4TOTAL, T3FREE, THYROIDAB in the last 72 hours.  Invalid input(s): FREET3   Coagulation profile No results for input(s): INR, PROTIME in the last 168 hours. ------------------------------------------------------------------------------------------------------------------- No results for input(s): DDIMER in the last 72 hours. -------------------------------------------------------------------------------------------------------------------  Cardiac Enzymes Recent Labs  Lab 04/07/18 1359  TROPONINI <0.03   ------------------------------------------------------------------------------------------------------------------ Invalid input(s): POCBNP  ---------------------------------------------------------------------------------------------------------------  Urinalysis    Component Value Date/Time   COLORURINE YELLOW 07/07/2013 [place] 07/07/2013 1646   LABSPEC 1.020 07/07/2013 1646   PHURINE 6.0 07/07/2013 1646   GLUCOSEU NEGATIVE 07/07/2013 1646   HGBUR NEGATIVE 07/07/2013 1646   BILIRUBINUR NEGATIVE 07/07/2013 1646   KETONESUR NEGATIVE 07/07/2013 1646   PROTEINUR NEGATIVE 07/07/2013 1646   NITRITE NEGATIVE 07/07/2013 1646   LEUKOCYTESUR 3+ 07/07/2013 1646     RADIOLOGY: Dg Chest 2 View  Result Date: 04/07/2018 CLINICAL DATA:  Chest pain today. EXAM: CHEST - 2 VIEW COMPARISON:  01/14/2018 FINDINGS: Postsurgical changes from CABG. Loop recorder overlies the left lower thorax.  Cardiomediastinal silhouette is normal. Mediastinal contours appear intact. Calcific atherosclerotic disease and tortuosity of the aorta. There is no evidence of focal airspace consolidation, pleural effusion or pneumothorax. Chronic interstitial lung changes. Osseous structures are without acute abnormality. Soft tissues are grossly normal. IMPRESSION: Chronic interstitial lung changes. Calcific atherosclerotic disease and tortuosity of the aorta. Electronically Signed   By: Fidela Salisbury M.D.   On: 04/07/2018 14:16    EKG: Orders placed or performed during the hospital encounter of 01/16/18  . ED EKG  . ED EKG    IMPRESSION AND PLAN:  Dwayne Terry  is a 84 y.o. male with a known history of CAD, multiple cardiac stents, myeloma presented to ER with chest pain.   - Chest pain Admit to telemetry under observation bed Cycle troponins Check echocardiogram Cardiology consult Prn nitrates and morphine for pain  - CAD with stents Resume aspirin and brilinta  -Diabetes mellitus type II Sliding scale coverage with insulin  -Hyperlipidemia Continue statin  -DVT prophylaxis with  lovenox  All the records are reviewed and case discussed with ED provider. Management plans discussed with the patient, family and they are in agreement.  CODE STATUS:Full code Code Status History    Date Active Date Inactive Code Status Order ID Comments User Context   01/16/2018 1352 01/18/2018 1914 Full Code [id number]  Epifanio Lesches, MD ED   01/14/2018 0335 01/14/2018 1754 Full Code [id number]  Salary, Holly Bodily  D, MD Inpatient   12/30/2017 0220 12/31/2017 1703 Full Code [id number]  Amelia Jo, MD Inpatient   10/29/2017 0228 10/31/2017 1900 Full Code [id number]  Lance Coon, MD ED   09/17/2017 0058 09/20/2017 1524 Full Code [id number]  Lance Coon, MD Inpatient   06/10/2017 0324 06/11/2017 1613 Full Code [id number]  Harrie Foreman, MD Inpatient   04/29/2017 0512 04/30/2017 1437 Full Code [id number]   Harrie Foreman, MD Inpatient   01/27/2017 2356 01/29/2017 1430 Full Code [id number]  Hugelmeyer, Ubaldo Glassing, DO Inpatient    Advance Directive Documentation     Most Recent Value  Type of Advance Directive  Healthcare Power of Attorney, Living will, Out of facility DNR (pink MOST or yellow form)  Pre-existing out of facility DNR order (yellow form or pink MOST form)  -  "MOST" Form in Place?  -       TOTAL TIME TAKING CARE OF THIS PATIENT: 51 minutes.    Saundra Shelling M.D on 04/07/2018 at 4:02 PM  Between 7am to 6pm - Pager - [phone]  After 6pm go to [email or web address] - password [email or web address] [facility]  [facility]  Office  [phone]  CC: Primary care physician; Kirk Ruths, MD

## 2018-04-07 NOTE — Care Management Obs Status (Signed)
[facility] NOTIFICATION   Patient Details  Name: Dwayne Terry MRN: [id number] Date of Birth: 1930-11-15   Medicare Observation Status Notification Given:  Yes    Marshell Garfinkel, RN 04/07/2018, 3:40 PM

## 2018-04-07 NOTE — Care Management (Signed)
RNCM met with patient and his wife of 7 years and their daughter to discuss [place] letter.  He is currently follow by [facility] home health through [facility].  Patient wishes to stay at [facility]. Unit clerk updated in ED. c

## 2018-04-07 NOTE — ED Triage Notes (Signed)
Pt arrived via POV to the EMS bay c/o of chest pain. Pt not in distress at this time and A&Ox3.

## 2018-04-07 NOTE — Progress Notes (Signed)
Advanced care plan.  Purpose of the Encounter: CODE STATUS  Parties in Attendance:Patient Patient's Decision Capacity:Good Subjective/Patient's story: Presented with chest pain Objective/Medical story Has history of CAD with multiple stents now with chest pain Goals of care determination:  Advance directives discussed Patient wants everything done for now which includes cardiac resuscitation, intubation and ventilator if need arises. CODE STATUS: Full code Time spent discussing advanced care planning: 16 minutes

## 2018-04-07 NOTE — ED Provider Notes (Signed)
[facility] Emergency Department Provider Note    First MD Initiated Contact with Patient 04/07/18 1314     (approximate)  I have reviewed the triage vital signs and the nursing notes.   HISTORY  Chief Complaint Chest Pain    HPI Dwayne Terry is a 84 y.o. male presents to the ER chief complaint of mid to left sternal chest pains started this morning while the patient was doing exercises with physical therapy.  Patient described as a pressure.  Has extensive cardiac history with recent admission to [facility] for stent placement just this past week.  Denies any shortness of breath.  Denies any pain at this moment.  States he took a pill to help with the pain but states it was not nitroglycerin but cannot remember what it was.  Denies any recent fevers.  No nausea or vomiting.  Says he also has a history of cancer and is on Brilinta and has not missed any doses of that.  Past Medical History:  Diagnosis Date  . Cancer ([place])    bone marrow, prostate cancer  . Diabetes mellitus without complication ([place])   . Hyperlipidemia   . MI (mitral incompetence)   . MI (myocardial infarction) ([place])    Family History  Problem Relation Age of Onset  . Heart disease Other    Past Surgical History:  Procedure Laterality Date  . AORTIC VALVE REPLACEMENT (AVR)/CORONARY ARTERY BYPASS GRAFTING (CABG)    . CORONARY ANGIOPLASTY WITH STENT PLACEMENT    . CORONARY BALLOON ANGIOPLASTY N/A 06/10/2017   Procedure: Coronary Balloon Angioplasty;  Surgeon: Wellington Hampshire, MD;  Location: [place] CV LAB;  Service: Cardiovascular;  Laterality: N/A;  . CORONARY STENT INTERVENTION N/A 01/28/2017   Procedure: Coronary Stent Intervention;  Surgeon: [name], MD;  Location: [place] CV LAB;  Service: Cardiovascular;  Laterality: N/A;  . CORONARY STENT INTERVENTION N/A 04/29/2017   Procedure: Coronary Stent Intervention;  Surgeon: [name], MD;   Location: [place] CV LAB;  Service: Cardiovascular;  Laterality: N/A;  . CORONARY STENT INTERVENTION N/A 09/19/2017   Procedure: CORONARY/GRAFT ANGIOGRAPHY;  Surgeon: Corey Skains, MD;  Location: [place] CV LAB;  Service: Cardiovascular;  Laterality: N/A;  . CORONARY STENT INTERVENTION N/A 09/19/2017   Procedure: CORONARY STENT INTERVENTION;  Surgeon: Wellington Hampshire, MD;  Location: [place] CV LAB;  Service: Cardiovascular;  Laterality: N/A;  . LEFT HEART CATH AND CORONARY ANGIOGRAPHY N/A 01/28/2017   Procedure: Left Heart Cath and Coronary Angiography;  Surgeon: [name], MD;  Location: [place] CV LAB;  Service: Cardiovascular;  Laterality: N/A;  . LEFT HEART CATH AND CORONARY ANGIOGRAPHY N/A 10/31/2017   Procedure: LEFT HEART CATH AND CORONARY ANGIOGRAPHY;  Surgeon: Teodoro Spray, MD;  Location: [place] CV LAB;  Service: Cardiovascular;  Laterality: N/A;  . LEFT HEART CATH AND CORS/GRAFTS ANGIOGRAPHY N/A 04/29/2017   Procedure: Left Heart Cath and Cors/Grafts Angiography;  Surgeon: [name], MD;  Location: [place] CV LAB;  Service: Cardiovascular;  Laterality: N/A;  . LEFT HEART CATH AND CORS/GRAFTS ANGIOGRAPHY N/A 06/10/2017   Procedure: Left Heart Cath and Cors/Grafts Angiography;  Surgeon: Corey Skains, MD;  Location: [place] [facility];  Service: Cardiovascular;  Laterality: N/A;  . LEFT HEART CATH AND CORS/GRAFTS ANGIOGRAPHY N/A 09/19/2017   Procedure: LEFT HEART CATH;  Surgeon: Corey Skains, MD;  Location: [place] CV LAB;  Service: Cardiovascular;  Laterality: N/A;   Patient Active Problem  List   Diagnosis Date Noted  . Acute renal failure (ARF) ([place]) 01/16/2018  . Pulmonary edema 12/30/2017  . HLD (hyperlipidemia) 09/16/2017  . Diabetes ([place]) 09/16/2017  . Chest pain 04/29/2017  . NSTEMI (non-ST elevated myocardial infarction) ([place]) 04/29/2017  . Presence of coronary angioplasty implant and graft  03/21/2017  . Chest pain, rule out acute myocardial infarction 01/27/2017  . Dyspnea on exertion 09/06/2014  . Malignant neoplasm of prostate ([place]) 05/09/2014  . Atherosclerotic heart disease of native coronary artery without angina pectoris 07/10/2013  . Essential (primary) hypertension 07/10/2013      Prior to Admission medications   Medication Sig Start Date End Date Taking? Authorizing Provider  acyclovir (ZOVIRAX) 200 MG capsule Take 200 mg by mouth 2 (two) times daily.    [provider]  ALPRAZolam Duanne Moron) 0.25 MG tablet Take 1 tablet (0.25 mg total) by mouth 2 (two) times daily as needed for anxiety. 01/14/18   Vaughan Basta, MD  aspirin EC 81 MG tablet Take 1 tablet (81 mg total) by mouth daily. 01/29/17   Loletha Grayer, MD  carvedilol (COREG) 3.125 MG tablet Take 1 tablet (3.125 mg total) by mouth 2 (two) times daily with a meal. 12/31/17   Epifanio Lesches, MD  clopidogrel (PLAVIX) 75 MG tablet Take 1 tablet (75 mg total) by mouth daily with breakfast. 04/30/17   Henreitta Leber, MD  dexamethasone (DECADRON) 2 MG tablet Take 8.5 mg by mouth See admin instructions. Take 8.5 mg on Thursday and Friday.    [provider]  finasteride (PROSCAR) 5 MG tablet Take 2.5 mg by mouth daily.     [provider]  lansoprazole (PREVACID) 30 MG capsule Take 30 mg by mouth 2 (two) times daily as needed.  11/03/17 11/03/18  [provider]  lisinopril (ZESTRIL) 2.5 MG tablet Take 1 tablet (2.5 mg total) by mouth daily. 09/20/17 09/20/18  Dustin Flock, MD  lovastatin (MEVACOR) 20 MG tablet Take 20 mg by mouth daily at 6 PM.    [provider]  metFORMIN (GLUCOPHAGE) 500 MG tablet Take 500 mg by mouth 2 (two) times daily with a meal.    [provider]  Multiple Vitamin (MULTI-VITAMINS) TABS Take 1 tablet by mouth daily.    [provider]  potassium chloride SA (K-DUR,KLOR-CON) 20 MEQ tablet Take 1 tablet (20 mEq total) by  mouth daily. 09/20/17   Dustin Flock, MD  silver sulfADIAZINE (SILVADENE) 1 % cream Apply to affected area daily 08/03/17 08/03/18  Duanne Guess, PA-C    Allergies Antihistamines, chlorpheniramine-type and Nitroglycerin    Social History Social History   Tobacco Use  . Smoking status: Former Smoker    Last attempt to quit: 2015    Years since quitting: 4.3  . Smokeless tobacco: Former Network engineer Use Topics  . Alcohol use: No  . Drug use: No    Review of Systems Patient denies headaches, rhinorrhea, blurry vision, numbness, shortness of breath, chest pain, edema, cough, abdominal pain, nausea, vomiting, diarrhea, dysuria, fevers, rashes or hallucinations unless otherwise stated above in HPI. ____________________________________________   PHYSICAL EXAM:  VITAL SIGNS: Vitals:   04/07/18 1409 04/07/18 1430  BP: 134/65 131/69  Pulse: 85 80  Resp: (!) 29 19  Temp:    SpO2: 100% 100%    Constitutional: Alert and oriented.  in no acute distress. Eyes: Conjunctivae are normal.  Head: Atraumatic. Nose: No congestion/rhinnorhea. Mouth/Throat: Mucous membranes are moist.   Neck: No stridor. Painless ROM.  Cardiovascular: Normal rate, regular rhythm. Grossly normal heart sounds.  Good peripheral circulation. Respiratory: Normal respiratory effort.  No retractions. Lungs CTAB. Gastrointestinal: Soft and nontender. No distention. No abdominal bruits. No CVA tenderness. Genitourinary:  Musculoskeletal: No lower extremity tenderness nor edema.  No joint effusions. Neurologic:  Normal speech and language. No gross focal neurologic deficits are appreciated. No facial droop Skin:  Skin is warm, dry and intact. No rash noted. Psychiatric: Mood and affect are normal. Speech and behavior are normal.  ____________________________________________   LABS (all labs ordered are listed, but only abnormal results are displayed)  Results for orders placed or performed during the  hospital encounter of 04/07/18 (from the past 24 hour(s))  CBC with Differential/Platelet     Status: Abnormal   Collection Time: 04/07/18  1:29 PM  Result Value Ref Range   WBC 8.2 3.8 - 10.6 K/uL   RBC 3.64 (L) 4.40 - 5.90 MIL/uL   Hemoglobin 11.3 (L) 13.0 - 18.0 g/dL   HCT 33.5 (L) 40.0 - 52.0 %   MCV 92.1 80.0 - 100.0 fL   MCH 31.2 26.0 - 34.0 pg   MCHC 33.9 32.0 - 36.0 g/dL   RDW 17.8 (H) 11.5 - 14.5 %   Platelets 183 150 - 440 K/uL   Neutrophils Relative % 85 %   Neutro Abs 7.0 (H) 1.4 - 6.5 K/uL   Lymphocytes Relative 7 %   Lymphs Abs 0.6 (L) 1.0 - 3.6 K/uL   Monocytes Relative 7 %   Monocytes Absolute 0.6 0.2 - 1.0 K/uL   Eosinophils Relative 1 %   Eosinophils Absolute 0.0 0 - 0.7 K/uL   Basophils Relative 0 %   Basophils Absolute 0.0 0 - 0.1 K/uL  Comprehensive metabolic panel     Status: Abnormal   Collection Time: 04/07/18  1:59 PM  Result Value Ref Range   Sodium 135 135 - 145 mmol/L   Potassium 4.0 3.5 - 5.1 mmol/L   Chloride 106 101 - 111 mmol/L   CO2 21 (L) 22 - 32 mmol/L   Glucose, Bld 127 (H) 65 - 99 mg/dL   BUN 14 6 - 20 mg/dL   Creatinine, Ser 1.18 0.61 - 1.24 mg/dL   Calcium 9.0 8.9 - 10.3 mg/dL   Total Protein 7.6 6.5 - 8.1 g/dL   Albumin 3.5 3.5 - 5.0 g/dL   AST 35 15 - 41 U/L   ALT 27 17 - 63 U/L   Alkaline Phosphatase 61 38 - 126 U/L   Total Bilirubin 0.7 0.3 - 1.2 mg/dL   GFR calc non Af Amer 54 (L) >60 mL/min   GFR calc Af Amer >60 >60 mL/min   Anion gap 8 5 - 15  Troponin I     Status: None   Collection Time: 04/07/18  1:59 PM  Result Value Ref Range   Troponin I <0.03 <0.03 ng/mL   ____________________________________________  EKG My review and personal interpretation at Time: 13:07   Indication: chest pain  Rate: 95  Rhythm: sinus Axis: normal  Other: lbbb, no significant changes from previous ____________________________________________  RADIOLOGY  I personally reviewed all radiographic images ordered to evaluate for the above  acute complaints and reviewed radiology reports and findings.  These findings were personally discussed with the patient.  Please see medical record for radiology report.  ____________________________________________   PROCEDURES  Procedure(s) performed:  Procedures    Critical Care performed: no ____________________________________________   INITIAL IMPRESSION / ASSESSMENT AND PLAN / ED  COURSE  Pertinent labs & imaging results that were available during my care of the patient were reviewed by me and considered in my medical decision making (see chart for details).  DDX: ACS, pericarditis, esophagitis, boerhaaves, pe, dissection, pna, bronchitis, costochondritis   Dwayne Terry is a 84 y.o. who presents to the ED with chest pain as described above status post recent cardiac cath.  Blood work is reassuring.  EKG shows nonspecific changes but is a left bundle branch which is not noted based on patient's risk factors presentation with acute pain will order CT angiogram to exclude PE or aneurysmal abnormality and admit patient to the hospital for further medical management.      As part of my medical decision making, I reviewed the following data within the [place] notes reviewed and incorporated, Labs reviewed, notes from prior ED visits and [place] Controlled Substance Database   ____________________________________________   FINAL CLINICAL IMPRESSION(S) / ED DIAGNOSES  Final diagnoses:  Chest pain, unspecified type      NEW MEDICATIONS STARTED DURING THIS VISIT:  New Prescriptions   No medications on file     Note:  This document was prepared using Dragon voice recognition software and may include unintentional dictation errors.    Merlyn Lot, MD 04/07/18 [phone]

## 2018-04-07 NOTE — Progress Notes (Signed)
*  PRELIMINARY RESULTS* Echocardiogram 2D Echocardiogram has been performed.  [place] 04/07/2018, 8:17 PM

## 2018-04-08 LAB — TROPONIN I: Troponin I: 0.06 ng/mL (ref <0.03)

## 2018-04-08 MED ORDER — ISOSORBIDE MONONITRATE ER 30 MG PO TB24: 15.0000 mg | ORAL_TABLET | Freq: Every day | ORAL | Status: DC

## 2018-04-08 MED ORDER — PANTOPRAZOLE SODIUM 40 MG PO PACK
20.0000 mg | PACK | Freq: Every day | ORAL | Status: DC
  Administered 2018-04-08: 20 mg via ORAL
  Filled 2018-04-08: qty 20

## 2018-04-08 MED ORDER — TICAGRELOR 90 MG PO TABS: 90.0000 mg | ORAL_TABLET | Freq: Two times a day (BID) | ORAL | 0 refills | Status: DC

## 2018-04-08 MED ORDER — ISOSORBIDE MONONITRATE ER 30 MG PO TB24: 15.0000 mg | ORAL_TABLET | Freq: Every day | ORAL | 0 refills | Status: DC

## 2018-04-08 NOTE — Progress Notes (Signed)
CRITICAL VALUE ALERT  Critical Value:  Troponin 0.06  Date & Time Notified:  5/11 0011  Provider Notified: Duane Boston, MD  Orders Received/Actions taken: No new orders given. Awaiting cardiology consult in AM.   Patient currently has no complaints of chest pain. Nursing staff will continue to monitor for any changes in patient status. Earleen Reaper, RN

## 2018-04-08 NOTE — Discharge Summary (Signed)
[place] [facility] [place] NAME: Dwayne Terry    MR#:  [id number]  DATE OF BIRTH:  06-21-1930  DATE OF ADMISSION:  04/07/2018 ADMITTING PHYSICIAN: Saundra Shelling, MD  DATE OF DISCHARGE: No discharge date for patient encounter.  PRIMARY CARE PHYSICIAN: Kirk Ruths, MD    ADMISSION DIAGNOSIS:  Chest pain, unspecified type [R07.9]  DISCHARGE DIAGNOSIS:  Active Problems:   Chest pain   SECONDARY DIAGNOSIS:   Past Medical History:  Diagnosis Date  . Cancer ([place])    bone marrow, prostate cancer  . Diabetes mellitus without complication ([place])   . Hyperlipidemia   . MI (mitral incompetence)   . MI (myocardial infarction) [facility])     HOSPITAL COURSE:  Dwayne Terry  is a 84 y.o. male with a known history of CAD, multiple cardiac stents, myeloma presented to ER with chest pain.   Acute recurrent chest pain Resolved Cardiology did see patient while in house/Dr. Nehemiah Massed recommending outpatient follow-up in 1 week/started on imdur, continue beta-blocker therapy, ACE inhibitor, echocardiogram done-report pending at the time of discharge, cardiac enzymes were unimpressive, and patient did well  History of CAD with stents Stable Continued aspirin and brilinta  Chronic diabetes mellitus type II Stable on current regiment  Chronic Hyperlipidemia Stable Continued statin therapy  DISCHARGE CONDITIONS:   stable  CONSULTS OBTAINED:  Treatment Team:  Corey Skains, MD  DRUG ALLERGIES:   Allergies  Allergen Reactions  . Antihistamines, Chlorpheniramine-Type Other (See Comments)    Reaction: unknown  . Nitroglycerin Other (See Comments)    Reaction: hypotension    DISCHARGE MEDICATIONS:   Allergies as of 04/08/2018      Reactions   Antihistamines, Chlorpheniramine-type Other (See Comments)   Reaction: unknown   Nitroglycerin Other (See Comments)   Reaction: hypotension      Medication List    STOP  taking these medications   clopidogrel 75 MG tablet Commonly known as:  PLAVIX     TAKE these medications   acyclovir 200 MG capsule Commonly known as:  ZOVIRAX Take 200 mg by mouth 2 (two) times daily.   ALPRAZolam 0.25 MG tablet Commonly known as:  XANAX Take 1 tablet (0.25 mg total) by mouth 2 (two) times daily as needed for anxiety.   aspirin EC 81 MG tablet Take 1 tablet (81 mg total) by mouth daily.   carvedilol 3.125 MG tablet Commonly known as:  COREG Take 1 tablet (3.125 mg total) by mouth 2 (two) times daily with a meal.   dexamethasone 2 MG tablet Commonly known as:  DECADRON Take 8.5 mg by mouth See admin instructions. Take 8.5 mg on Thursday and Friday.   finasteride 5 MG tablet Commonly known as:  PROSCAR Take 2.5 mg by mouth daily.   isosorbide mononitrate 30 MG 24 hr tablet Commonly known as:  IMDUR Take 0.5 tablets (15 mg total) by mouth daily.   lansoprazole 30 MG capsule Commonly known as:  PREVACID Take 30 mg by mouth 2 (two) times daily as needed.   lisinopril 2.5 MG tablet Commonly known as:  ZESTRIL Take 1 tablet (2.5 mg total) by mouth daily.   lovastatin 20 MG tablet Commonly known as:  MEVACOR Take 20 mg by mouth daily at 6 PM.   metFORMIN 500 MG tablet Commonly known as:  GLUCOPHAGE Take 500 mg by mouth 2 (two) times daily with a meal.   MULTI-VITAMINS Tabs Take 1 tablet by mouth daily.   potassium  chloride SA 20 MEQ tablet Commonly known as:  K-DUR,KLOR-CON Take 1 tablet (20 mEq total) by mouth daily.   silver sulfADIAZINE 1 % cream Commonly known as:  SILVADENE Apply to affected area daily   ticagrelor 90 MG Tabs tablet Commonly known as:  BRILINTA Take 1 tablet (90 mg total) by mouth 2 (two) times daily.        DISCHARGE INSTRUCTIONS:    If you experience worsening of your admission symptoms, develop shortness of breath, life threatening emergency, suicidal or homicidal thoughts you must seek medical attention  immediately by calling 911 or calling your MD immediately  if symptoms less severe.  You Must read complete instructions/literature along with all the possible adverse reactions/side effects for all the Medicines you take and that have been prescribed to you. Take any new Medicines after you have completely understood and accept all the possible adverse reactions/side effects.   Please note  You were cared for by a hospitalist during your hospital stay. If you have any questions about your discharge medications or the care you received while you were in the hospital after you are discharged, you can call the unit and asked to speak with the hospitalist on call if the hospitalist that took care of you is not available. Once you are discharged, your primary care physician will handle any further medical issues. Please note that NO REFILLS for any discharge medications will be authorized once you are discharged, as it is imperative that you return to your primary care physician (or establish a relationship with a primary care physician if you do not have one) for your aftercare needs so that they can reassess your need for medications and monitor your lab values.    Today   CHIEF COMPLAINT:   Chief Complaint  Patient presents with  . Chest Pain    HISTORY OF PRESENT ILLNESS:  84 y.o. male with a known history of CAD, multiple cardiac stents, myeloma presented to ER with chest pain. Pain started at 9am this morning. Pain is sharp in nature.It was 6 out of 10 on a scale o 1 to 10.Patient recently had stents placed two weeks ago and has been compliant on meds.No shortness of breath, orthopnea. Troponin negative in ER. EKG reviewed shows old LBBB.  VITAL SIGNS:  Blood pressure (!) 118/59, pulse 69, temperature 97.9 F (36.6 C), temperature source Oral, resp. rate 16, height 5\' 7"  (1.702 m), weight 61.2 kg (135 lb), SpO2 97 %.  I/O:    Intake/Output Summary (Last 24 hours) at 04/08/2018  1217 Last data filed at 04/08/2018 1014 Gross per 24 hour  Intake 480 ml  Output 1200 ml  Net -720 ml    PHYSICAL EXAMINATION:  GENERAL:  84 y.o.-year-old patient lying in the bed with no acute distress.  EYES: Pupils equal, round, reactive to light and accommodation. No scleral icterus. Extraocular muscles intact.  HEENT: Head atraumatic, normocephalic. Oropharynx and nasopharynx clear.  NECK:  Supple, no jugular venous distention. No thyroid enlargement, no tenderness.  LUNGS: Normal breath sounds bilaterally, no wheezing, rales,rhonchi or crepitation. No use of accessory muscles of respiration.  CARDIOVASCULAR: S1, S2 normal. No murmurs, rubs, or gallops.  ABDOMEN: Soft, non-tender, non-distended. Bowel sounds present. No organomegaly or mass.  EXTREMITIES: No pedal edema, cyanosis, or clubbing.  NEUROLOGIC: Cranial nerves II through XII are intact. Muscle strength 5/5 in all extremities. Sensation intact. Gait not checked.  PSYCHIATRIC: The patient is alert and oriented x 3.  SKIN: No obvious  rash, lesion, or ulcer.   DATA REVIEW:   CBC Recent Labs  Lab 04/07/18 1329  WBC 8.2  HGB 11.3*  HCT 33.5*  PLT 183    Chemistries  Recent Labs  Lab 04/07/18 1359  NA 135  K 4.0  CL 106  CO2 21*  GLUCOSE 127*  BUN 14  CREATININE 1.18  CALCIUM 9.0  AST 35  ALT 27  ALKPHOS 61  BILITOT 0.7    Cardiac Enzymes Recent Labs  Lab 04/07/18 1906  TROPONINI 0.06*    Microbiology Results  No results found for this or any previous visit.  RADIOLOGY:  Dg Chest 2 View  Result Date: 04/07/2018 CLINICAL DATA:  Chest pain today. EXAM: CHEST - 2 VIEW COMPARISON:  01/14/2018 FINDINGS: Postsurgical changes from CABG. Loop recorder overlies the left lower thorax. Cardiomediastinal silhouette is normal. Mediastinal contours appear intact. Calcific atherosclerotic disease and tortuosity of the aorta. There is no evidence of focal airspace consolidation, pleural effusion or  pneumothorax. Chronic interstitial lung changes. Osseous structures are without acute abnormality. Soft tissues are grossly normal. IMPRESSION: Chronic interstitial lung changes. Calcific atherosclerotic disease and tortuosity of the aorta. Electronically Signed   By: Fidela Salisbury M.D.   On: 04/07/2018 14:16    EKG:   Orders placed or performed during the hospital encounter of 01/16/18  . ED EKG  . ED EKG      Management plans discussed with the patient, family and they are in agreement.  CODE STATUS:     Code Status Orders  (From admission, onward)        Start     Ordered   04/07/18 1620  Full code  Continuous     04/07/18 1620    Code Status History    Date Active Date Inactive Code Status Order ID Comments User Context   01/16/2018 1352 01/18/2018 1914 Full Code [id number]  Epifanio Lesches, MD ED   01/14/2018 0335 01/14/2018 1754 Full Code [id number]  Bryan Omura, Avel Peace, MD Inpatient   12/30/2017 0220 12/31/2017 1703 Full Code [id number]  Amelia Jo, MD Inpatient   10/29/2017 0228 10/31/2017 1900 Full Code [id number]  Lance Coon, MD ED   09/17/2017 0058 09/20/2017 1524 Full Code [id number]  Lance Coon, MD Inpatient   06/10/2017 0324 06/11/2017 1613 Full Code [id number]  Harrie Foreman, MD Inpatient   04/29/2017 0512 04/30/2017 1437 Full Code [id number]  Harrie Foreman, MD Inpatient   01/27/2017 2356 01/29/2017 1430 Full Code [id number]  Hugelmeyer, Ubaldo Glassing, DO Inpatient    Advance Directive Documentation     Most Recent Value  Type of Advance Directive  Healthcare Power of Attorney, Living will  Pre-existing out of facility DNR order (yellow form or pink MOST form)  -  "MOST" Form in Place?  -      TOTAL TIME TAKING CARE OF THIS PATIENT: 35 minutes.    Avel Peace Honor Fairbank M.D on 04/08/2018 at 12:17 PM  Between 7am to 6pm - Pager - [phone]  After 6pm go to [email or web address] - password [email or web address] [place] [facility]  Office  [phone]  CC: Primary  care physician; Kirk Ruths, MD   Note: This dictation was prepared with Dragon dictation along with smaller phrase technology. Any transcriptional errors that result from this process are unintentional.

## 2018-04-08 NOTE — Consult Note (Signed)
[facility] Consultation Note  Patient ID: Dwayne Terry, MRN: [id number], DOB/AGE: May 29, 1930 84 y.o. Admit date: 04/07/2018   Date of Consult: 04/08/2018 Primary Physician: Kirk Ruths, MD Primary Cardiologist: Raynelle Chary  Chief Complaint:  Chief Complaint  Patient presents with  . Chest Pain   Reason for Consult: Chest pain  HPI: 84 y.o. male with known coronary artery disease diabetes hypertension who has had previous coronary bypass graft and recent stenting at [facility].  The patient has had improvements of physical activity and on appropriate medication management including dual antiplatelet therapy high intensity cholesterol therapy and ACE inhibitor carvedilol for hypertension control.  The patient has ambulated fairly well until he had significant episodes of left chest discomfort.  This was atypical in nature and was not associated with shortness of breath and did not respond to nitroglycerin.  The patient was seen in the emergency room and then had spontaneous relief of chest pain.  Currently he feels much better with no significant concerns at all.  He has not ambulated yet although claims that this is not his typical chest pain for coronary artery disease.  The patient does have an EKG unchanged at this point and a troponin level peaking out at 0.05.  Past Medical History:  Diagnosis Date  . Cancer ([place])    bone marrow, prostate cancer  . Diabetes mellitus without complication ([place])   . Hyperlipidemia   . MI (mitral incompetence)   . MI (myocardial infarction) [facility])       Surgical History:  Past Surgical History:  Procedure Laterality Date  . AORTIC VALVE REPLACEMENT (AVR)/CORONARY ARTERY BYPASS GRAFTING (CABG)    . CORONARY ANGIOPLASTY WITH STENT PLACEMENT    . CORONARY BALLOON ANGIOPLASTY N/A 06/10/2017   Procedure: Coronary Balloon Angioplasty;  Surgeon: Wellington Hampshire, MD;  Location: [place] [facility];  Service: Cardiovascular;   Laterality: N/A;  . CORONARY STENT INTERVENTION N/A 01/28/2017   Procedure: Coronary Stent Intervention;  Surgeon: [name], MD;  Location: [place] CV LAB;  Service: Cardiovascular;  Laterality: N/A;  . CORONARY STENT INTERVENTION N/A 04/29/2017   Procedure: Coronary Stent Intervention;  Surgeon: [name], MD;  Location: [place] CV LAB;  Service: Cardiovascular;  Laterality: N/A;  . CORONARY STENT INTERVENTION N/A 09/19/2017   Procedure: CORONARY/GRAFT ANGIOGRAPHY;  Surgeon: Corey Skains, MD;  Location: [place] CV LAB;  Service: Cardiovascular;  Laterality: N/A;  . CORONARY STENT INTERVENTION N/A 09/19/2017   Procedure: CORONARY STENT INTERVENTION;  Surgeon: Wellington Hampshire, MD;  Location: [place] CV LAB;  Service: Cardiovascular;  Laterality: N/A;  . LEFT HEART CATH AND CORONARY ANGIOGRAPHY N/A 01/28/2017   Procedure: Left Heart Cath and Coronary Angiography;  Surgeon: [name], MD;  Location: [place] [facility];  Service: Cardiovascular;  Laterality: N/A;  . LEFT HEART CATH AND CORONARY ANGIOGRAPHY N/A 10/31/2017   Procedure: LEFT HEART CATH AND CORONARY ANGIOGRAPHY;  Surgeon: Teodoro Spray, MD;  Location: [place] CV LAB;  Service: Cardiovascular;  Laterality: N/A;  . LEFT HEART CATH AND CORS/GRAFTS ANGIOGRAPHY N/A 04/29/2017   Procedure: Left Heart Cath and Cors/Grafts Angiography;  Surgeon: [name], MD;  Location: [place] CV LAB;  Service: Cardiovascular;  Laterality: N/A;  . LEFT HEART CATH AND CORS/GRAFTS ANGIOGRAPHY N/A 06/10/2017   Procedure: Left Heart Cath and Cors/Grafts Angiography;  Surgeon: Corey Skains, MD;  Location: [place] [facility];  Service: Cardiovascular;  Laterality: N/A;  . LEFT HEART CATH AND  CORS/GRAFTS ANGIOGRAPHY N/A 09/19/2017   Procedure: LEFT HEART CATH;  Surgeon: Corey Skains, MD;  Location: [place] [facility] LAB;  Service: Cardiovascular;  Laterality: N/A;     Home  Meds: Prior to Admission medications   Medication Sig Start Date End Date Taking? Authorizing Provider  acyclovir (ZOVIRAX) 200 MG capsule Take 200 mg by mouth 2 (two) times daily.    [provider]  ALPRAZolam Duanne Moron) 0.25 MG tablet Take 1 tablet (0.25 mg total) by mouth 2 (two) times daily as needed for anxiety. 01/14/18   Vaughan Basta, MD  aspirin EC 81 MG tablet Take 1 tablet (81 mg total) by mouth daily. 01/29/17   Loletha Grayer, MD  carvedilol (COREG) 3.125 MG tablet Take 1 tablet (3.125 mg total) by mouth 2 (two) times daily with a meal. 12/31/17   Epifanio Lesches, MD  clopidogrel (PLAVIX) 75 MG tablet Take 1 tablet (75 mg total) by mouth daily with breakfast. 04/30/17   Henreitta Leber, MD  dexamethasone (DECADRON) 2 MG tablet Take 8.5 mg by mouth See admin instructions. Take 8.5 mg on Thursday and Friday.    [provider]  finasteride (PROSCAR) 5 MG tablet Take 2.5 mg by mouth daily.     [provider]  lansoprazole (PREVACID) 30 MG capsule Take 30 mg by mouth 2 (two) times daily as needed.  11/03/17 11/03/18  [provider]  lisinopril (ZESTRIL) 2.5 MG tablet Take 1 tablet (2.5 mg total) by mouth daily. 09/20/17 09/20/18  Dustin Flock, MD  lovastatin (MEVACOR) 20 MG tablet Take 20 mg by mouth daily at 6 PM.    [provider]  metFORMIN (GLUCOPHAGE) 500 MG tablet Take 500 mg by mouth 2 (two) times daily with a meal.    [provider]  Multiple Vitamin (MULTI-VITAMINS) TABS Take 1 tablet by mouth daily.    [provider]  potassium chloride SA (K-DUR,KLOR-CON) 20 MEQ tablet Take 1 tablet (20 mEq total) by mouth daily. 09/20/17   Dustin Flock, MD  silver sulfADIAZINE (SILVADENE) 1 % cream Apply to affected area daily 08/03/17 08/03/18  Duanne Guess, PA-C    Inpatient Medications:  . acyclovir  200 mg Oral BID  . aspirin EC  81 mg Oral Daily  . carvedilol  3.125 mg Oral BID WC  . dexamethasone  8.5 mg  Oral See admin instructions  . enoxaparin (LOVENOX) injection  40 mg Subcutaneous Q24H  . finasteride  2.5 mg Oral Daily  . lisinopril  2.5 mg Oral Daily  . multivitamin with minerals  1 tablet Oral Daily  . pantoprazole sodium  20 mg Oral Daily  . potassium chloride SA  20 mEq Oral Daily  . pravastatin  40 mg Oral q1800  . silver sulfADIAZINE   Topical Daily  . sodium chloride flush  3 mL Intravenous Q12H  . ticagrelor  90 mg Oral BID   . sodium chloride      Allergies:  Allergies  Allergen Reactions  . Antihistamines, Chlorpheniramine-Type Other (See Comments)    Reaction: unknown  . Nitroglycerin Other (See Comments)    Reaction: hypotension    Social History   Socioeconomic History  . Marital status: Married    Spouse name: Not on file  . Number of children: Not on file  . Years of education: Not on file  . Highest education level: Not on file  Occupational History  . Occupation: retired  Scientific laboratory technician  . Financial resource strain: Not on file  .  Food insecurity:    Worry: Not on file    Inability: Not on file  . Transportation needs:    Medical: Not on file    Non-medical: Not on file  Tobacco Use  . Smoking status: Former Smoker    Last attempt to quit: 2015    Years since quitting: 4.3  . Smokeless tobacco: Former Network engineer and Sexual Activity  . Alcohol use: No  . Drug use: No  . Sexual activity: Not on file  Lifestyle  . Physical activity:    Days per week: Not on file    Minutes per session: Not on file  . Stress: Not on file  Relationships  . Social connections:    Talks on phone: Not on file    Gets together: Not on file    Attends religious service: Not on file    Active member of club or organization: Not on file    Attends meetings of clubs or organizations: Not on file    Relationship status: Not on file  . Intimate partner violence:    Fear of current or ex partner: Not on file    Emotionally abused: Not on file    Physically  abused: Not on file    Forced sexual activity: Not on file  Other Topics Concern  . Not on file  Social History Narrative  . Not on file     Family History  Problem Relation Age of Onset  . Heart disease Other   . Heart disease Father      Review of Systems Positive for chest pain Negative for: General:  chills, fever, night sweats or weight changes.  Cardiovascular: PND orthopnea syncope dizziness  Dermatological skin lesions rashes Respiratory: Cough congestion Urologic: Frequent urination urination at night and hematuria Abdominal: negative for nausea, vomiting, diarrhea, bright red blood per rectum, melena, or hematemesis Neurologic: negative for visual changes, and/or hearing changes  All other systems reviewed and are otherwise negative except as noted above.  Labs: Recent Labs    04/07/18 1359 04/07/18 1906  TROPONINI <0.03 0.06*   Lab Results  Component Value Date   WBC 8.2 04/07/2018   HGB 11.3 (L) 04/07/2018   HCT 33.5 (L) 04/07/2018   MCV 92.1 04/07/2018   PLT 183 04/07/2018    Recent Labs  Lab 04/07/18 1359  NA 135  K 4.0  CL 106  CO2 21*  BUN 14  CREATININE 1.18  CALCIUM 9.0  PROT 7.6  BILITOT 0.7  ALKPHOS 61  ALT 27  AST 35  GLUCOSE 127*   Lab Results  Component Value Date   CHOL 79 01/14/2018   HDL 38 (L) 01/14/2018   LDLCALC 27 01/14/2018   TRIG 72 01/14/2018   No results found for: DDIMER  Radiology/Studies:  Dg Chest 2 View  Result Date: 04/07/2018 CLINICAL DATA:  Chest pain today. EXAM: CHEST - 2 VIEW COMPARISON:  01/14/2018 FINDINGS: Postsurgical changes from CABG. Loop recorder overlies the left lower thorax. Cardiomediastinal silhouette is normal. Mediastinal contours appear intact. Calcific atherosclerotic disease and tortuosity of the aorta. There is no evidence of focal airspace consolidation, pleural effusion or pneumothorax. Chronic interstitial lung changes. Osseous structures are without acute abnormality. Soft tissues  are grossly normal. IMPRESSION: Chronic interstitial lung changes. Calcific atherosclerotic disease and tortuosity of the aorta. Electronically Signed   By: Fidela Salisbury M.D.   On: 04/07/2018 14:16    EKG: Mobile sinus rhythm  Weights: Filed Weights   04/07/18  1650 04/08/18 0443  Weight: 135 lb 9.6 oz (61.5 kg) 135 lb (61.2 kg)     Physical Exam: Blood pressure (!) 114/56, pulse 63, temperature 97.6 F (36.4 C), temperature source Oral, resp. rate 16, height 5\' 7"  (1.702 m), weight 135 lb (61.2 kg), SpO2 98 %. Body mass index is 21.14 kg/m. General: Well developed, well nourished, in no acute distress. Head eyes ears nose throat: Normocephalic, atraumatic, sclera non-icteric, no xanthomas, nares are without discharge. No apparent thyromegaly and/or mass  Lungs: Normal respiratory effort.  no wheezes, no rales, no rhonchi.  Heart: RRR with normal S1 S2. no murmur gallop, no rub, PMI is normal size and placement, carotid upstroke normal without bruit, jugular venous pressure is normal Abdomen: Soft, non-tender, non-distended with normoactive bowel sounds. No hepatomegaly. No rebound/guarding. No obvious abdominal masses. Abdominal aorta is normal size without bruit Extremities: No edema. no cyanosis, no clubbing, no ulcers  Peripheral : 2+ bilateral upper extremity pulses, 2+ bilateral femoral pulses, 2+ bilateral dorsal pedal pulse Neuro: Alert and oriented. No facial asymmetry. No focal deficit. Moves all extremities spontaneously. Musculoskeletal: Normal muscle tone without kyphosis Psych:  Responds to questions appropriately with a normal affect.    Assessment: 84 year old male with essential hypertension mixed hyperlipidemia coronary artery disease status post coronary bypass graft status post PCI and stent placement having atypical chest discomfort and no current evidence of true myocardial infarction  Plan: 1.  Begin ambulation and follow for improvements of symptoms and  further evaluation and treatment options for the possibility of true angina 2.  Continue beta-blocker ACE inhibitor for hypertension control with goal systolic blood pressure below 130 mm for coronary artery disease 3.  Dual antiplatelet therapy for recent stenting 4.  Consider isosorbide for chest pain and treatment of coronary artery disease and bypass graft changes 5.  Possible discharge home if ambulating well and no further significant symptoms with follow-up next week for further adjustments of medications  Signed, Corey Skains M.D. [facility] 04/08/2018, 7:01 AM

## 2018-04-08 NOTE — Progress Notes (Signed)
Pt to be discharged to home today. Iv and tele removed. disch instructions and prescrips given to pt and wife. disch via w.c. Accompanied by wife.

## 2018-04-09 LAB — ECHOCARDIOGRAM COMPLETE
Height: 67 in
Weight: 2169.6 oz

## 2018-08-19 ENCOUNTER — Encounter: Payer: Self-pay | Admitting: Emergency Medicine

## 2018-08-19 ENCOUNTER — Observation Stay
Admission: EM | Admit: 2018-08-19 | Discharge: 2018-08-20 | Disposition: A | Discharge status: Home / Self Care | Payer: Medicare HMO | Attending: Internal Medicine | Admitting: Internal Medicine

## 2018-08-19 ENCOUNTER — Other Ambulatory Visit: Payer: Self-pay

## 2018-08-19 ENCOUNTER — Emergency Department: Payer: Medicare HMO

## 2018-08-19 DIAGNOSIS — I2510 Atherosclerotic heart disease of native coronary artery without angina pectoris: Secondary | ICD-10-CM | POA: Diagnosis not present

## 2018-08-19 DIAGNOSIS — R079 Chest pain, unspecified: Secondary | ICD-10-CM | POA: Diagnosis present

## 2018-08-19 DIAGNOSIS — I252 Old myocardial infarction: Secondary | ICD-10-CM | POA: Diagnosis not present

## 2018-08-19 DIAGNOSIS — Z7984 Long term (current) use of oral hypoglycemic drugs: Secondary | ICD-10-CM | POA: Diagnosis not present

## 2018-08-19 DIAGNOSIS — I4891 Unspecified atrial fibrillation: Secondary | ICD-10-CM | POA: Insufficient documentation

## 2018-08-19 DIAGNOSIS — E119 Type 2 diabetes mellitus without complications: Secondary | ICD-10-CM | POA: Diagnosis not present

## 2018-08-19 DIAGNOSIS — C9000 Multiple myeloma not having achieved remission: Secondary | ICD-10-CM | POA: Diagnosis not present

## 2018-08-19 DIAGNOSIS — Z8249 Family history of ischemic heart disease and other diseases of the circulatory system: Secondary | ICD-10-CM | POA: Diagnosis not present

## 2018-08-19 DIAGNOSIS — Z951 Presence of aortocoronary bypass graft: Secondary | ICD-10-CM | POA: Insufficient documentation

## 2018-08-19 DIAGNOSIS — Z87891 Personal history of nicotine dependence: Secondary | ICD-10-CM | POA: Diagnosis not present

## 2018-08-19 DIAGNOSIS — Z888 Allergy status to other drugs, medicaments and biological substances status: Secondary | ICD-10-CM | POA: Insufficient documentation

## 2018-08-19 DIAGNOSIS — K219 Gastro-esophageal reflux disease without esophagitis: Secondary | ICD-10-CM | POA: Diagnosis not present

## 2018-08-19 DIAGNOSIS — R0789 Other chest pain: Secondary | ICD-10-CM | POA: Diagnosis not present

## 2018-08-19 DIAGNOSIS — I10 Essential (primary) hypertension: Secondary | ICD-10-CM | POA: Insufficient documentation

## 2018-08-19 DIAGNOSIS — E785 Hyperlipidemia, unspecified: Secondary | ICD-10-CM | POA: Diagnosis not present

## 2018-08-19 DIAGNOSIS — Z955 Presence of coronary angioplasty implant and graft: Secondary | ICD-10-CM | POA: Insufficient documentation

## 2018-08-19 DIAGNOSIS — Z66 Do not resuscitate: Secondary | ICD-10-CM | POA: Insufficient documentation

## 2018-08-19 DIAGNOSIS — Z952 Presence of prosthetic heart valve: Secondary | ICD-10-CM | POA: Diagnosis not present

## 2018-08-19 DIAGNOSIS — C61 Malignant neoplasm of prostate: Secondary | ICD-10-CM | POA: Diagnosis not present

## 2018-08-19 DIAGNOSIS — Z79899 Other long term (current) drug therapy: Secondary | ICD-10-CM | POA: Diagnosis not present

## 2018-08-19 DIAGNOSIS — I447 Left bundle-branch block, unspecified: Secondary | ICD-10-CM | POA: Insufficient documentation

## 2018-08-19 DIAGNOSIS — Z7982 Long term (current) use of aspirin: Secondary | ICD-10-CM | POA: Diagnosis not present

## 2018-08-19 LAB — COMPREHENSIVE METABOLIC PANEL
ALT: 16 U/L (ref 0–44)
AST: 26 U/L (ref 15–41)
Albumin: 3.5 g/dL (ref 3.5–5.0)
Alkaline Phosphatase: 62 U/L (ref 38–126)
Anion gap: 9 (ref 5–15)
BUN: 22 mg/dL (ref 8–23)
CO2: 20 mmol/L — ABNORMAL LOW (ref 22–32)
CREATININE: 1.22 mg/dL (ref 0.61–1.24)
Calcium: 8.5 mg/dL — ABNORMAL LOW (ref 8.9–10.3)
Chloride: 106 mmol/L (ref 98–111)
GFR calc Af Amer: >60 mL/min (ref >60)
GFR, EST NON AFRICAN AMERICAN: 52 mL/min — AB (ref >60)
Glucose, Bld: 349 mg/dL — ABNORMAL HIGH (ref 70–99)
POTASSIUM: 4.3 mmol/L (ref 3.5–5.1)
Sodium: 135 mmol/L (ref 135–145)
Total Bilirubin: 0.3 mg/dL (ref 0.3–1.2)
Total Protein: 6.9 g/dL (ref 6.5–8.1)

## 2018-08-19 LAB — GLUCOSE, CAPILLARY
GLUCOSE-CAPILLARY: 239 mg/dL — AB (ref 70–99)
Glucose-Capillary: 236 mg/dL — ABNORMAL HIGH (ref 70–99)

## 2018-08-19 LAB — CBC WITH DIFFERENTIAL/PLATELET
BASOS ABS: 0 10*3/uL (ref 0–0.1)
Basophils Relative: 0 %
EOS ABS: 0 10*3/uL (ref 0–0.7)
EOS PCT: 0 %
HCT: 27.6 % — ABNORMAL LOW (ref 40.0–52.0)
Hemoglobin: 9.2 g/dL — ABNORMAL LOW (ref 13.0–18.0)
Lymphocytes Relative: 3 %
Lymphs Abs: 0.4 10*3/uL — ABNORMAL LOW (ref 1.0–3.6)
MCH: 29.6 pg (ref 26.0–34.0)
MCHC: 33.4 g/dL (ref 32.0–36.0)
MCV: 88.4 fL (ref 80.0–100.0)
Monocytes Absolute: 0.3 10*3/uL (ref 0.2–1.0)
Monocytes Relative: 2 %
Neutro Abs: 13.8 10*3/uL — ABNORMAL HIGH (ref 1.4–6.5)
Neutrophils Relative %: 95 %
PLATELETS: 121 10*3/uL — AB (ref 150–440)
RBC: 3.12 MIL/uL — AB (ref 4.40–5.90)
RDW: 17.2 % — ABNORMAL HIGH (ref 11.5–14.5)
WBC: 14.5 10*3/uL — AB (ref 3.8–10.6)

## 2018-08-19 LAB — PROTIME-INR
INR: 1.07
PROTHROMBIN TIME: 13.8 s (ref 11.4–15.2)

## 2018-08-19 LAB — APTT: APTT: 25 s (ref 24–36)

## 2018-08-19 LAB — BRAIN NATRIURETIC PEPTIDE: B Natriuretic Peptide: 578 pg/mL — ABNORMAL HIGH (ref 0.0–100.0)

## 2018-08-19 LAB — TROPONIN I

## 2018-08-19 MED ORDER — ATORVASTATIN CALCIUM 20 MG PO TABS: 80.0000 mg | ORAL_TABLET | Freq: Every day | ORAL | Status: DC

## 2018-08-19 MED ORDER — ACETAMINOPHEN 325 MG PO TABS: 650.0000 mg | ORAL_TABLET | ORAL | Status: DC | PRN

## 2018-08-19 MED ORDER — ASPIRIN EC 81 MG PO TBEC
81.0000 mg | DELAYED_RELEASE_TABLET | Freq: Every day | ORAL | Status: DC
  Administered 2018-08-20: 81 mg via ORAL
  Filled 2018-08-19: qty 1

## 2018-08-19 MED ORDER — MORPHINE SULFATE (PF) 2 MG/ML IV SOLN: 2.0000 mg | INTRAVENOUS | Status: DC | PRN

## 2018-08-19 MED ORDER — ACYCLOVIR 200 MG PO CAPS
200.0000 mg | ORAL_CAPSULE | Freq: Two times a day (BID) | ORAL | Status: DC
  Administered 2018-08-19 – 2018-08-20 (×2): 200 mg via ORAL
  Filled 2018-08-19 (×3): qty 1

## 2018-08-19 MED ORDER — CARVEDILOL 3.125 MG PO TABS
3.1250 mg | ORAL_TABLET | Freq: Two times a day (BID) | ORAL | Status: DC
  Administered 2018-08-19 – 2018-08-20 (×2): 3.125 mg via ORAL
  Filled 2018-08-19 (×2): qty 1

## 2018-08-19 MED ORDER — FINASTERIDE 5 MG PO TABS: 2.5000 mg | ORAL_TABLET | Freq: Every day | ORAL | Status: DC

## 2018-08-19 MED ORDER — ONDANSETRON HCL 4 MG/2ML IJ SOLN: 4.0000 mg | Freq: Four times a day (QID) | INTRAMUSCULAR | Status: DC | PRN

## 2018-08-19 MED ORDER — PANTOPRAZOLE SODIUM 40 MG PO TBEC
40.0000 mg | DELAYED_RELEASE_TABLET | Freq: Every day | ORAL | Status: DC
  Administered 2018-08-20: 40 mg via ORAL
  Filled 2018-08-19: qty 1

## 2018-08-19 MED ORDER — TICAGRELOR 90 MG PO TABS
90.0000 mg | ORAL_TABLET | Freq: Two times a day (BID) | ORAL | Status: DC
  Administered 2018-08-19 – 2018-08-20 (×2): 90 mg via ORAL
  Filled 2018-08-19 (×2): qty 1

## 2018-08-19 MED ORDER — INSULIN ASPART 100 UNIT/ML ~~LOC~~ SOLN
0.0000 [IU] | Freq: Three times a day (TID) | SUBCUTANEOUS | Status: DC
  Administered 2018-08-19: 3 [IU] via SUBCUTANEOUS
  Filled 2018-08-19: qty 1

## 2018-08-19 MED ORDER — DEXAMETHASONE 4 MG PO TABS: 8.5000 mg | ORAL_TABLET | ORAL | Status: DC

## 2018-08-19 MED ORDER — ALPRAZOLAM 0.5 MG PO TABS: 0.2500 mg | ORAL_TABLET | Freq: Two times a day (BID) | ORAL | Status: DC | PRN

## 2018-08-19 MED ORDER — FINASTERIDE 5 MG PO TABS
2.5000 mg | ORAL_TABLET | Freq: Every day | ORAL | Status: DC
  Administered 2018-08-19: 2.5 mg via ORAL
  Filled 2018-08-19: qty 0.5
  Filled 2018-08-19: qty 1
  Filled 2018-08-19: qty 0.5

## 2018-08-19 MED ORDER — INSULIN ASPART 100 UNIT/ML ~~LOC~~ SOLN
0.0000 [IU] | Freq: Every day | SUBCUTANEOUS | Status: DC
  Administered 2018-08-19: 2 [IU] via SUBCUTANEOUS
  Filled 2018-08-19: qty 1

## 2018-08-19 MED ORDER — LISINOPRIL 5 MG PO TABS
2.5000 mg | ORAL_TABLET | Freq: Every day | ORAL | Status: DC
  Administered 2018-08-19 – 2018-08-20 (×2): 2.5 mg via ORAL
  Filled 2018-08-19 (×2): qty 1

## 2018-08-19 MED ORDER — METFORMIN HCL 500 MG PO TABS
500.0000 mg | ORAL_TABLET | Freq: Two times a day (BID) | ORAL | Status: DC
  Administered 2018-08-20: 500 mg via ORAL
  Filled 2018-08-19: qty 1

## 2018-08-19 MED ORDER — ISOSORBIDE MONONITRATE ER 30 MG PO TB24
15.0000 mg | ORAL_TABLET | Freq: Every day | ORAL | Status: DC
  Administered 2018-08-20: 15 mg via ORAL
  Filled 2018-08-19: qty 1

## 2018-08-19 MED ORDER — PRAVASTATIN SODIUM 40 MG PO TABS: 40.0000 mg | ORAL_TABLET | Freq: Every day | ORAL | Status: DC

## 2018-08-19 MED ORDER — ADULT MULTIVITAMIN W/MINERALS CH
1.0000 | ORAL_TABLET | Freq: Every day | ORAL | Status: DC
  Administered 2018-08-20: 1 via ORAL
  Filled 2018-08-19: qty 1

## 2018-08-19 MED ORDER — GI COCKTAIL ~~LOC~~
30.0000 mL | Freq: Four times a day (QID) | ORAL | Status: DC | PRN
  Administered 2018-08-19: 30 mL via ORAL
  Filled 2018-08-19 (×2): qty 30

## 2018-08-19 MED ORDER — POTASSIUM CHLORIDE CRYS ER 20 MEQ PO TBCR
20.0000 meq | EXTENDED_RELEASE_TABLET | Freq: Every day | ORAL | Status: DC
  Administered 2018-08-19 – 2018-08-20 (×2): 20 meq via ORAL
  Filled 2018-08-19 (×2): qty 1

## 2018-08-19 MED ORDER — ENOXAPARIN SODIUM 40 MG/0.4ML ~~LOC~~ SOLN
40.0000 mg | SUBCUTANEOUS | Status: DC
  Administered 2018-08-19: 40 mg via SUBCUTANEOUS
  Filled 2018-08-19: qty 0.4

## 2018-08-19 NOTE — ED Triage Notes (Signed)
pt presents via acems with c/o chest pressure at home non-radiating. N/vx1. Pt took lasix and zantac and told ems it relieved chest pressure. Left bundle branch block for ems, wheezing noted by ems. Chemo at [place] done yesterday. Bone marrow and protstate cancer.Per EMS patient states he is not on radiation, just injection chemotherapy.

## 2018-08-19 NOTE — Progress Notes (Signed)
Family Meeting Note  Advance Directive:yes  Today a meeting took place with the Patient.    The following clinical team members were present during this meeting:MD  The following were discussed:Patient's diagnosis: Chest pain with history of coronary artery disease status post stent placement in May, bone marrow and prostate cancer status post chemotherapy, other comorbidities as documented below and treatment plan of care discussed in detail with the patient.   Cancer ([place])     bone marrow, prostate cancer  . Diabetes mellitus without complication ([place])   . Hyperlipidemia   . MI (mitral incompetence)   . MI (myocardial infarction) ([place])      Patient's progosis: Unable to determine and Goals for treatment: DNR, daughter Darrin Nipper is HCPOA  Additional follow-up to be provided: Hospitalist, cardiology  Time spent during discussion:17 MIN  Nicholes Mango, MD

## 2018-08-19 NOTE — H&P (Signed)
[place] [facility] [employer] NAME: Dwayne Terry    MR#:  [id number]  DATE OF BIRTH:  07-04-1930  DATE OF ADMISSION:  08/19/2018  PRIMARY CARE PHYSICIAN: Kirk Ruths, MD   REQUESTING/REFERRING PHYSICIAN: Schuyler Amor, MD  CHIEF COMPLAINT:  Chest pain  HISTORY OF PRESENT ILLNESS:  Dwayne Terry  is a 84 y.o. male with a known history of coronary artery disease, history of left bundle branch block no EKG changes from previous EKG in May 2019 status post stent placement at [facility] in May 2019 , bone marrow and prostate cancer on chemotherapy is presenting to the ED with a chief complaint of nonradiating midsternal chest pain which lasted for 2 hours.  Patient denies any shortness of breath or cough or diaphoresis.  During my examination he is resting comfortably.  Initial troponin is negative.  Daughter at bedside PAST MEDICAL HISTORY:   Past Medical History:  Diagnosis Date  . Cancer ([place])    bone marrow, prostate cancer  . Diabetes mellitus without complication ([place])   . Hyperlipidemia   . MI (mitral incompetence)   . MI (myocardial infarction) ([place])     PAST SURGICAL HISTOIRY:   Past Surgical History:  Procedure Laterality Date  . AORTIC VALVE REPLACEMENT (AVR)/CORONARY ARTERY BYPASS GRAFTING (CABG)    . CORONARY ANGIOPLASTY WITH STENT PLACEMENT    . CORONARY BALLOON ANGIOPLASTY N/A 06/10/2017   Procedure: Coronary Balloon Angioplasty;  Surgeon: Wellington Hampshire, MD;  Location: [place] [facility] LAB;  Service: Cardiovascular;  Laterality: N/A;  . CORONARY STENT INTERVENTION N/A 01/28/2017   Procedure: Coronary Stent Intervention;  Surgeon: [name], MD;  Location: [place] [facility];  Service: Cardiovascular;  Laterality: N/A;  . CORONARY STENT INTERVENTION N/A 04/29/2017   Procedure: Coronary Stent Intervention;  Surgeon: [name], MD;  Location: [place] [facility];  Service: Cardiovascular;   Laterality: N/A;  . CORONARY STENT INTERVENTION N/A 09/19/2017   Procedure: CORONARY/GRAFT ANGIOGRAPHY;  Surgeon: Corey Skains, MD;  Location: [place] [facility];  Service: Cardiovascular;  Laterality: N/A;  . CORONARY STENT INTERVENTION N/A 09/19/2017   Procedure: CORONARY STENT INTERVENTION;  Surgeon: Wellington Hampshire, MD;  Location: [place] [facility];  Service: Cardiovascular;  Laterality: N/A;  . LEFT HEART CATH AND CORONARY ANGIOGRAPHY N/A 01/28/2017   Procedure: Left Heart Cath and Coronary Angiography;  Surgeon: [name], MD;  Location: [place] [facility];  Service: Cardiovascular;  Laterality: N/A;  . LEFT HEART CATH AND CORONARY ANGIOGRAPHY N/A 10/31/2017   Procedure: LEFT HEART CATH AND CORONARY ANGIOGRAPHY;  Surgeon: Teodoro Spray, MD;  Location: [place] [facility];  Service: Cardiovascular;  Laterality: N/A;  . LEFT HEART CATH AND CORS/GRAFTS ANGIOGRAPHY N/A 04/29/2017   Procedure: Left Heart Cath and Cors/Grafts Angiography;  Surgeon: [name], MD;  Location: [place] [facility];  Service: Cardiovascular;  Laterality: N/A;  . LEFT HEART CATH AND CORS/GRAFTS ANGIOGRAPHY N/A 06/10/2017   Procedure: Left Heart Cath and Cors/Grafts Angiography;  Surgeon: Corey Skains, MD;  Location: [place] [facility];  Service: Cardiovascular;  Laterality: N/A;  . LEFT HEART CATH AND CORS/GRAFTS ANGIOGRAPHY N/A 09/19/2017   Procedure: LEFT HEART CATH;  Surgeon: Corey Skains, MD;  Location: [place] [facility];  Service: Cardiovascular;  Laterality: N/A;    SOCIAL HISTORY:   Social History   Tobacco Use  . Smoking status: Former Smoker    Last attempt to quit: 2015  Years since quitting: 4.7  . Smokeless tobacco: Former Network engineer Use Topics  . Alcohol use: No    FAMILY HISTORY:   Family History  Problem Relation Age of Onset  . Heart disease Other   . Heart disease Father     DRUG ALLERGIES:   Allergies  Allergen Reactions  .  Antihistamines, Chlorpheniramine-Type Other (See Comments)    Reaction: unknown  . Nitroglycerin Other (See Comments)    Reaction: hypotension    REVIEW OF SYSTEMS:  CONSTITUTIONAL: No fever, fatigue or weakness.  EYES: No blurred or double vision.  EARS, NOSE, AND THROAT: No tinnitus or ear pain.  RESPIRATORY: No cough, shortness of breath, wheezing or hemoptysis.  CARDIOVASCULAR: No chest pain, orthopnea, edema.  GASTROINTESTINAL: No nausea, vomiting, diarrhea or abdominal pain.  GENITOURINARY: No dysuria, hematuria.  ENDOCRINE: No polyuria, nocturia,  HEMATOLOGY: No anemia, easy bruising or bleeding SKIN: No rash or lesion. MUSCULOSKELETAL: No joint pain or arthritis.   NEUROLOGIC: No tingling, numbness, weakness.  PSYCHIATRY: No anxiety or depression.   MEDICATIONS AT HOME:   Prior to Admission medications   Medication Sig Start Date End Date Taking? Authorizing Provider  acyclovir (ZOVIRAX) 200 MG capsule Take 200 mg by mouth 2 (two) times daily.    [provider]  ALPRAZolam Duanne Moron) 0.25 MG tablet Take 1 tablet (0.25 mg total) by mouth 2 (two) times daily as needed for anxiety. 01/14/18   Vaughan Basta, MD  aspirin EC 81 MG tablet Take 1 tablet (81 mg total) by mouth daily. 01/29/17   Loletha Grayer, MD  atorvastatin (LIPITOR) 80 MG tablet Take 1 tablet by mouth daily. 05/18/18   [provider]  carvedilol (COREG) 3.125 MG tablet Take 1 tablet (3.125 mg total) by mouth 2 (two) times daily with a meal. 12/31/17   Epifanio Lesches, MD  dexamethasone (DECADRON) 2 MG tablet Take 8.5 mg by mouth See admin instructions. Take 8.5 mg on Thursday and Friday.    [provider]  finasteride (PROSCAR) 5 MG tablet Take 2.5 mg by mouth daily.     [provider]  isosorbide mononitrate (IMDUR) 30 MG 24 hr tablet Take 0.5 tablets (15 mg total) by mouth daily. 04/08/18   Salary, Avel Peace, MD  lansoprazole (PREVACID) 30 MG capsule Take 30 mg by  mouth 2 (two) times daily as needed.  11/03/17 11/03/18  [provider]  lisinopril (ZESTRIL) 2.5 MG tablet Take 1 tablet (2.5 mg total) by mouth daily. 09/20/17 09/20/18  Dustin Flock, MD  lovastatin (MEVACOR) 20 MG tablet Take 20 mg by mouth daily at 6 PM.    [provider]  metFORMIN (GLUCOPHAGE) 500 MG tablet Take 500 mg by mouth 2 (two) times daily with a meal.    [provider]  Multiple Vitamin (MULTI-VITAMINS) TABS Take 1 tablet by mouth daily.    [provider]  potassium chloride SA (K-DUR,KLOR-CON) 20 MEQ tablet Take 1 tablet (20 mEq total) by mouth daily. 09/20/17   Dustin Flock, MD  ticagrelor (BRILINTA) 90 MG TABS tablet Take 1 tablet (90 mg total) by mouth 2 (two) times daily. 04/08/18   Salary, Avel Peace, MD      VITAL SIGNS:  Blood pressure (!) 144/92, pulse 60, temperature 97.8 F (36.6 C), temperature source Oral, resp. rate 18, height 5\' 7"  (1.702 m), weight 68 kg, SpO2 98 %.  PHYSICAL EXAMINATION:  GENERAL:  84 y.o.-year-old patient lying in the bed with no acute distress.  EYES: Pupils  equal, round, reactive to light and accommodation. No scleral icterus. Extraocular muscles intact.  HEENT: Head atraumatic, normocephalic. Oropharynx and nasopharynx clear.  NECK:  Supple, no jugular venous distention. No thyroid enlargement, no tenderness.  LUNGS: Normal breath sounds bilaterally, no wheezing, rales,rhonchi or crepitation. No use of accessory muscles of respiration.  CARDIOVASCULAR: S1, S2 normal. No murmurs, rubs, or gallops.  No anterior chest wall tenderness on palpation ABDOMEN: Soft, nontender, nondistended. Bowel sounds present.  EXTREMITIES: No pedal edema, cyanosis, or clubbing.  NEUROLOGIC: Cranial nerves II through XII are intact. Muscle strength at his baseline in all extremities. Sensation intact. Gait not checked.  PSYCHIATRIC: The patient is alert and oriented x 3.  SKIN: No obvious rash, lesion, or ulcer.    LABORATORY PANEL:   CBC Recent Labs  Lab 08/19/18 1315  WBC 14.5*  HGB 9.2*  HCT 27.6*  PLT 121*   ------------------------------------------------------------------------------------------------------------------  Chemistries  Recent Labs  Lab 08/19/18 1315  NA 135  K 4.3  CL 106  CO2 20*  GLUCOSE 349*  BUN 22  CREATININE 1.22  CALCIUM 8.5*  AST 26  ALT 16  ALKPHOS 62  BILITOT 0.3   ------------------------------------------------------------------------------------------------------------------  Cardiac Enzymes Recent Labs  Lab 08/19/18 1315  TROPONINI <0.03   ------------------------------------------------------------------------------------------------------------------  RADIOLOGY:  Dg Chest Port 1 View  Result Date: 08/19/2018 CLINICAL DATA:  Chest pain. History of myocardial infarction and bypass. History of prostate cancer. EXAM: PORTABLE CHEST 1 VIEW COMPARISON:  Radiographs 04/07/2018 and 01/14/2018.  CT 12/29/2017. FINDINGS: 1329 hours. The heart size and mediastinal contours are stable status post median sternotomy and CABG. Loop recorder overlies the left ventricle. There is stable mild chronic interstitial prominence throughout the lungs. No edema, confluent airspace opacity, pleural effusion or pneumothorax. No acute osseous findings are seen. IMPRESSION: Stable postoperative chest.  No acute cardiopulmonary process. Electronically Signed   By: Richardean Sale M.D.   On: 08/19/2018 13:49    EKG:   Orders placed or performed during the hospital encounter of 08/19/18  . EKG 12-Lead  . EKG 12-Lead  . ED EKG  . ED EKG    IMPRESSION AND PLAN:    Chest pain with history of coronary artery disease status post stent placement in May 2019 Admit to telemetry under observation status Initial troponin is negative and patient is symptom-free during my examination. Cycle troponins Oxygen as needed, nitroglycerin as needed Aspirin beta-blocker and  statin Morphine as needed for severe pain [facility] cardiology consult is placed  History of coronary artery disease status post stent placement with history of old left bundle branch block Continue home medication Coreg, aspirin, Brilinta, lisinopril, statin and Imdur  Diabetes mellitus Sliding scale insulin and hold metformin for now  Hypertension continue Coreg and lisinopril and titrate as needed  Hyperlipidemia continue statin   All the records are reviewed and case discussed with ED provider. Management plans discussed with the patient, family and they are in agreement.  CODE STATUS: dnr   TOTAL TIME TAKING CARE OF THIS PATIENT: 42  minutes.   Note: This dictation was prepared with Dragon dictation along with smaller phrase technology. Any transcriptional errors that result from this process are unintentional.  Nicholes Mango M.D on 08/19/2018 at 3:15 PM  Between 7am to 6pm - Pager - [phone]  After 6pm go to [email or web address] - password [email or web address] [facility]  [facility]  Office  [phone]  CC: Primary care physician; Kirk Ruths, MD

## 2018-08-19 NOTE — ED Provider Notes (Signed)
[facility] Emergency Department Provider Note  ____________________________________________   I have reviewed the triage vital signs and the nursing notes. Where available I have reviewed prior notes and, if possible and indicated, outside hospital notes.    HISTORY  Chief Complaint Chest Pain    HPI Dwayne Terry is a 84 y.o. male  With a history of CAD also with bone marrow and prostate cancer he gets chemotherapeutic intervention, presents today complaining of having had chest pain.  Nonradiating, he describes it as significant at the time.  Gradual in onset.  Feels like prior ACS.  Did have stents placed in his heart in May.  Patient states he relaxed and sat down on the couch and it gradually went away.  He is pain-free now.  This is more than he usually has, usually goes away in a short period of time.  It lasted for 2 hours.  He was not diaphoretic.  It was not pleuritic no recent leg swelling, no fevers no chills no cough.  No abdominal pain.  It was a pressure-like discomfort that started at rest.    Past Medical History:  Diagnosis Date  . Cancer ([place])    bone marrow, prostate cancer  . Diabetes mellitus without complication ([place])   . Hyperlipidemia   . MI (mitral incompetence)   . [facility]     Patient Active Problem List   Diagnosis Date Noted  . Acute renal failure (ARF) ([place]) 01/16/2018  . Pulmonary edema 12/30/2017  . HLD (hyperlipidemia) 09/16/2017  . Diabetes ([place]) 09/16/2017  . Chest pain 04/29/2017  . NSTEMI (non-ST elevated myocardial infarction) ([place]) 04/29/2017  . Presence of coronary angioplasty implant and graft 03/21/2017  . Chest pain, rule out acute myocardial infarction 01/27/2017  . Dyspnea on exertion 09/06/2014  . Malignant neoplasm of prostate ([place]) 05/09/2014  . Atherosclerotic heart disease of native coronary artery without angina pectoris 07/10/2013  . Essential (primary) hypertension  07/10/2013    Past Surgical History:  Procedure Laterality Date  . AORTIC VALVE REPLACEMENT (AVR)/CORONARY ARTERY BYPASS GRAFTING (CABG)    . CORONARY ANGIOPLASTY WITH STENT PLACEMENT    . CORONARY BALLOON ANGIOPLASTY N/A 06/10/2017   Procedure: Coronary Balloon Angioplasty;  Surgeon: Wellington Hampshire, MD;  Location: [place] [facility];  Service: Cardiovascular;  Laterality: N/A;  . CORONARY STENT INTERVENTION N/A 01/28/2017   Procedure: Coronary Stent Intervention;  Surgeon: [name], MD;  Location: [place] [facility];  Service: Cardiovascular;  Laterality: N/A;  . CORONARY STENT INTERVENTION N/A 04/29/2017   Procedure: Coronary Stent Intervention;  Surgeon: [name], MD;  Location: [place] [facility];  Service: Cardiovascular;  Laterality: N/A;  . CORONARY STENT INTERVENTION N/A 09/19/2017   Procedure: CORONARY/GRAFT ANGIOGRAPHY;  Surgeon: Corey Skains, MD;  Location: [place] [facility];  Service: Cardiovascular;  Laterality: N/A;  . CORONARY STENT INTERVENTION N/A 09/19/2017   Procedure: CORONARY STENT INTERVENTION;  Surgeon: Wellington Hampshire, MD;  Location: [place] [facility];  Service: Cardiovascular;  Laterality: N/A;  . LEFT HEART CATH AND CORONARY ANGIOGRAPHY N/A 01/28/2017   Procedure: Left Heart Cath and Coronary Angiography;  Surgeon: [name], MD;  Location: [place] [facility];  Service: Cardiovascular;  Laterality: N/A;  . LEFT HEART CATH AND CORONARY ANGIOGRAPHY N/A 10/31/2017   Procedure: LEFT HEART CATH AND CORONARY ANGIOGRAPHY;  Surgeon: Teodoro Spray, MD;  Location: [place] [facility];  Service: Cardiovascular;  Laterality: N/A;  . LEFT HEART CATH  AND CORS/GRAFTS ANGIOGRAPHY N/A 04/29/2017   Procedure: Left Heart Cath and Cors/Grafts Angiography;  Surgeon: [name], MD;  Location: [place] [facility];  Service: Cardiovascular;  Laterality: N/A;  . LEFT HEART CATH AND CORS/GRAFTS ANGIOGRAPHY N/A 06/10/2017    Procedure: Left Heart Cath and Cors/Grafts Angiography;  Surgeon: Corey Skains, MD;  Location: [place] [facility];  Service: Cardiovascular;  Laterality: N/A;  . LEFT HEART CATH AND CORS/GRAFTS ANGIOGRAPHY N/A 09/19/2017   Procedure: LEFT HEART CATH;  Surgeon: Corey Skains, MD;  Location: [place] [facility];  Service: Cardiovascular;  Laterality: N/A;    Prior to Admission medications   Medication Sig Start Date End Date Taking? Authorizing Provider  acyclovir (ZOVIRAX) 200 MG capsule Take 200 mg by mouth 2 (two) times daily.    [provider]  ALPRAZolam Duanne Moron) 0.25 MG tablet Take 1 tablet (0.25 mg total) by mouth 2 (two) times daily as needed for anxiety. 01/14/18   Vaughan Basta, MD  aspirin EC 81 MG tablet Take 1 tablet (81 mg total) by mouth daily. 01/29/17   Loletha Grayer, MD  carvedilol (COREG) 3.125 MG tablet Take 1 tablet (3.125 mg total) by mouth 2 (two) times daily with a meal. 12/31/17   Epifanio Lesches, MD  dexamethasone (DECADRON) 2 MG tablet Take 8.5 mg by mouth See admin instructions. Take 8.5 mg on Thursday and Friday.    [provider]  finasteride (PROSCAR) 5 MG tablet Take 2.5 mg by mouth daily.     [provider]  isosorbide mononitrate (IMDUR) 30 MG 24 hr tablet Take 0.5 tablets (15 mg total) by mouth daily. 04/08/18   Salary, Avel Peace, MD  lansoprazole (PREVACID) 30 MG capsule Take 30 mg by mouth 2 (two) times daily as needed.  11/03/17 11/03/18  [provider]  lisinopril (ZESTRIL) 2.5 MG tablet Take 1 tablet (2.5 mg total) by mouth daily. 09/20/17 09/20/18  Dustin Flock, MD  lovastatin (MEVACOR) 20 MG tablet Take 20 mg by mouth daily at 6 PM.    [provider]  metFORMIN (GLUCOPHAGE) 500 MG tablet Take 500 mg by mouth 2 (two) times daily with a meal.    [provider]  Multiple Vitamin (MULTI-VITAMINS) TABS Take 1 tablet by mouth daily.    [provider]  potassium chloride SA  (K-DUR,KLOR-CON) 20 MEQ tablet Take 1 tablet (20 mEq total) by mouth daily. 09/20/17   Dustin Flock, MD  ticagrelor (BRILINTA) 90 MG TABS tablet Take 1 tablet (90 mg total) by mouth 2 (two) times daily. 04/08/18   Salary, Avel Peace, MD    Allergies Antihistamines, chlorpheniramine-type and Nitroglycerin  Family History  Problem Relation Age of Onset  . Heart disease Other   . Heart disease Father     Social History Social History   Tobacco Use  . Smoking status: Former Smoker    Last attempt to quit: 2015    Years since quitting: 4.7  . Smokeless tobacco: Former Network engineer Use Topics  . Alcohol use: No  . Drug use: No    Review of Systems Constitutional: No fever/chills Eyes: No visual changes. ENT: No sore throat. No stiff neck no neck pain Cardiovascular: Denies chest pain. Respiratory: Denies shortness of breath. Gastrointestinal:   no vomiting.  No diarrhea.  No constipation. Genitourinary: Negative for dysuria. Musculoskeletal: Negative lower extremity swelling Skin: Negative for rash. Neurological: Negative for severe headaches, focal weakness or numbness.   ____________________________________________   PHYSICAL EXAM:  VITAL SIGNS: ED Triage Vitals  Enc Vitals Group     BP 08/19/18 1311 (!) 158/68     Pulse Rate 08/19/18 1311 65     Resp 08/19/18 1311 20     Temp 08/19/18 1311 97.8 F (36.6 C)     Temp Source 08/19/18 1311 Oral     SpO2 08/19/18 1311 97 %     Weight 08/19/18 1308 150 lb (68 kg)     Height 08/19/18 1308 5\' 7"  (1.702 m)     Head Circumference --      Peak Flow --      Pain Score 08/19/18 1308 0     Pain Loc --      Pain Edu? --      Excl. in [place]? --     Constitutional: Alert and oriented. Well appearing and in no acute distress. Eyes: Conjunctivae are normal Head: Atraumatic HEENT: No congestion/rhinnorhea. Mucous membranes are moist.  Oropharynx non-erythematous Neck:   Nontender with no meningismus, no masses, no  stridor Cardiovascular: Normal rate, regular rhythm. Grossly normal heart sounds.  Good peripheral circulation. Respiratory: Normal respiratory effort.  No retractions. Lungs CTAB. Abdominal: Soft and nontender. No distention. No guarding no rebound Back:  There is no focal tenderness or step off.  there is no midline tenderness there are no lesions noted. there is no CVA tenderness Musculoskeletal: No lower extremity tenderness, no upper extremity tenderness. No joint effusions, no DVT signs strong distal pulses no edema Neurologic:  Normal speech and language. No gross focal neurologic deficits are appreciated.  Skin:  Skin is warm, dry and intact. No rash noted. Psychiatric: Mood and affect are normal. Speech and behavior are normal.  ____________________________________________   LABS (all labs ordered are listed, but only abnormal results are displayed)  Labs Reviewed  COMPREHENSIVE METABOLIC PANEL - Abnormal; Notable for the following components:      Result Value   CO2 20 (*)    Glucose, Bld 349 (*)    Calcium 8.5 (*)    GFR calc non Af Amer 52 (*)    All other components within normal limits  CBC WITH DIFFERENTIAL/PLATELET - Abnormal; Notable for the following components:   WBC 14.5 (*)    RBC 3.12 (*)    Hemoglobin 9.2 (*)    HCT 27.6 (*)    RDW 17.2 (*)    Platelets 121 (*)    Neutro Abs 13.8 (*)    Lymphs Abs 0.4 (*)    All other components within normal limits  BRAIN NATRIURETIC PEPTIDE - Abnormal; Notable for the following components:   B Natriuretic Peptide 578.0 (*)    All other components within normal limits  PROTIME-INR  APTT  TROPONIN I    Pertinent labs  results that were available during my care of the patient were reviewed by me and considered in my medical decision making (see chart for details). ____________________________________________  EKG  I personally interpreted any EKGs ordered by me or triage Sinus rhythm rate 60 bpm, left bundle branch  block which is old.   ____________________________________________  RADIOLOGY  Pertinent labs & imaging results that were available during my care of the patient were reviewed by me and considered in my medical decision making (see chart for details). If possible, patient and/or family made aware of any abnormal findings.  Dg Chest Port 1 View  Result Date: 08/19/2018 CLINICAL DATA:  Chest pain. History of myocardial infarction and bypass. History of prostate cancer. EXAM: PORTABLE  CHEST 1 VIEW COMPARISON:  Radiographs 04/07/2018 and 01/14/2018.  CT 12/29/2017. FINDINGS: 1329 hours. The heart size and mediastinal contours are stable status post median sternotomy and CABG. Loop recorder overlies the left ventricle. There is stable mild chronic interstitial prominence throughout the lungs. No edema, confluent airspace opacity, pleural effusion or pneumothorax. No acute osseous findings are seen. IMPRESSION: Stable postoperative chest.  No acute cardiopulmonary process. Electronically Signed   By: Richardean Sale M.D.   On: 08/19/2018 13:49   ____________________________________________    PROCEDURES  Procedure(s) performed: None  Procedures  Critical Care performed: None  ____________________________________________   INITIAL IMPRESSION / ASSESSMENT AND PLAN / ED COURSE  Pertinent labs & imaging results that were available during my care of the patient were reviewed by me and considered in my medical decision making (see chart for details).  With left-sided chest pain that lasted for 2 hours is gone now, does have a history of bundle branch block which limits interpretation of EKG.  Significant risk factors for ACS including prior stents placed in May.  We will admit him for observation.  Nothing to suggest PE or dissection at this time abdomen is benign as well    ____________________________________________   FINAL CLINICAL IMPRESSION(S) / ED DIAGNOSES  Final diagnoses:   Chest pain  Chest pain, unspecified type      This chart was dictated using voice recognition software.  Despite best efforts to proofread,  errors can occur which can change meaning.      Schuyler Amor, MD 08/19/18 1440

## 2018-08-20 LAB — TROPONIN I: Troponin I: <0.03 ng/mL (ref <0.03)

## 2018-08-20 LAB — GLUCOSE, CAPILLARY: Glucose-Capillary: 117 mg/dL — ABNORMAL HIGH (ref 70–99)

## 2018-08-20 NOTE — Progress Notes (Signed)
RN removed IV.  Patient will leave with his wife in a private vehicle.  Phillis Knack, RN

## 2018-08-20 NOTE — Care Management Obs Status (Signed)
[place] NOTIFICATION   Patient Details  Name: Dwayne Terry MRN: [id number] Date of Birth: 1929-12-06   Medicare Observation Status Notification Given:  No  Discharge order placed in < 24hr of being placed on observation  Katrina Stack, RN 08/20/2018, 11:31 AM

## 2018-08-20 NOTE — Consult Note (Signed)
Cardiology Consultation Note    Patient ID: Dwayne Terry, MRN: [id number], DOB/AGE: 07-06-1930 84 y.o. Admit date: 08/19/2018   Date of Consult: 08/20/2018 Primary Physician: Kirk Ruths, MD Primary Cardiologist: Dr. Saralyn Pilar  Chief Complaint: chest pain Reason for Consultation: chest pain Requesting MD: Dr. Fritzi Mandes  HPI: Dwayne Terry is a 84 y.o. male with history of coronary artery disease with cardiac catheterization done in March of thousand 18 which revealed an occluded proximal LAD, occluded ostial left circumflex, 95% proximal RCA with a patent LIMA to the LAD, patent saphenous vein graft to D1.  He underwent PCI of the proximal RCA with a 3.0 x 15 mm bare-metal vision stent.  His ejection fraction was normal.  He was readmitted in June 2018 with a non-ST elevation myocardial infarction.  Cardiac cath revealed a 95% in-stent restenosis of the proximal RCA and underwent successful PCI with placement of a 3.0 x 8 mm Xience Alpine stent with PTCA of the mid RCA.  He was again readmitted in July 2018 for a non-ST elevation myocardial infarction.  Underwent cardiac catheterization which again revealed in-stent restenosis of the proximal and mid RCA.  He underwent PTCA.  The patient was again admitted October 2018 with a non-ST elevation myocardial infarction and atrial fibrillation.  He underwent placement of a 3.25 x 15 mm Xience Sierra stent in the mid RCA.  He was again admitted in December 2018 with chest pain and cardiac catheterization revealed a patent left internal mammary to the LAD, patent saphenous vein graft to the OM1, 70 to 75% distal RCA not felt to be amenable to PCI.  He has been diagnosed also with multiple myeloma.  He is undergoing chemotherapy at the [facility].  He presented to the emergency room with complaints of midsternal chest pain which persisted for approximately 2 hours.  He has ruled out for a myocardial infarction.  Past Medical  History:  Diagnosis Date  . Cancer ([place])    bone marrow, prostate cancer  . Diabetes mellitus without complication ([place])   . Hyperlipidemia   . MI (mitral incompetence)   . MI (myocardial infarction) [facility])       Surgical History:  Past Surgical History:  Procedure Laterality Date  . AORTIC VALVE REPLACEMENT (AVR)/CORONARY ARTERY BYPASS GRAFTING (CABG)    . CORONARY ANGIOPLASTY WITH STENT PLACEMENT    . CORONARY BALLOON ANGIOPLASTY N/A 06/10/2017   Procedure: Coronary Balloon Angioplasty;  Surgeon: Wellington Hampshire, MD;  Location: [place] [facility];  Service: Cardiovascular;  Laterality: N/A;  . CORONARY STENT INTERVENTION N/A 01/28/2017   Procedure: Coronary Stent Intervention;  Surgeon: [name], MD;  Location: [place] [facility];  Service: Cardiovascular;  Laterality: N/A;  . CORONARY STENT INTERVENTION N/A 04/29/2017   Procedure: Coronary Stent Intervention;  Surgeon: [name], MD;  Location: [place] [facility];  Service: Cardiovascular;  Laterality: N/A;  . CORONARY STENT INTERVENTION N/A 09/19/2017   Procedure: CORONARY/GRAFT ANGIOGRAPHY;  Surgeon: Corey Skains, MD;  Location: [place] [facility];  Service: Cardiovascular;  Laterality: N/A;  . CORONARY STENT INTERVENTION N/A 09/19/2017   Procedure: CORONARY STENT INTERVENTION;  Surgeon: Wellington Hampshire, MD;  Location: [place] [facility];  Service: Cardiovascular;  Laterality: N/A;  . LEFT HEART CATH AND CORONARY ANGIOGRAPHY N/A 01/28/2017   Procedure: Left Heart Cath and Coronary Angiography;  Surgeon: [name], MD;  Location: [place] [facility];  Service: Cardiovascular;  Laterality: N/A;  .  LEFT HEART CATH AND CORONARY ANGIOGRAPHY N/A 10/31/2017   Procedure: LEFT HEART CATH AND CORONARY ANGIOGRAPHY;  Surgeon: Teodoro Spray, MD;  Location: [place] [facility];  Service: Cardiovascular;  Laterality: N/A;  . LEFT HEART CATH AND CORS/GRAFTS ANGIOGRAPHY N/A 04/29/2017   Procedure: Left  Heart Cath and Cors/Grafts Angiography;  Surgeon: [name], MD;  Location: [place] [facility];  Service: Cardiovascular;  Laterality: N/A;  . LEFT HEART CATH AND CORS/GRAFTS ANGIOGRAPHY N/A 06/10/2017   Procedure: Left Heart Cath and Cors/Grafts Angiography;  Surgeon: Corey Skains, MD;  Location: [place] [facility];  Service: Cardiovascular;  Laterality: N/A;  . LEFT HEART CATH AND CORS/GRAFTS ANGIOGRAPHY N/A 09/19/2017   Procedure: LEFT HEART CATH;  Surgeon: Corey Skains, MD;  Location: [place] [facility];  Service: Cardiovascular;  Laterality: N/A;     Home Meds: Prior to Admission medications   Medication Sig Start Date End Date Taking? Authorizing Provider  acyclovir (ZOVIRAX) 200 MG capsule Take 200 mg by mouth 2 (two) times daily.    [provider]  ALPRAZolam Duanne Moron) 0.25 MG tablet Take 1 tablet (0.25 mg total) by mouth 2 (two) times daily as needed for anxiety. 01/14/18   Vaughan Basta, MD  aspirin EC 81 MG tablet Take 1 tablet (81 mg total) by mouth daily. 01/29/17   Loletha Grayer, MD  atorvastatin (LIPITOR) 80 MG tablet Take 1 tablet by mouth daily. 05/18/18   [provider]  carvedilol (COREG) 3.125 MG tablet Take 1 tablet (3.125 mg total) by mouth 2 (two) times daily with a meal. 12/31/17   Epifanio Lesches, MD  dexamethasone (DECADRON) 2 MG tablet Take 8.5 mg by mouth See admin instructions. Take 8.5 mg on Thursday and Friday.    [provider]  finasteride (PROSCAR) 5 MG tablet Take 2.5 mg by mouth daily.     [provider]  isosorbide mononitrate (IMDUR) 30 MG 24 hr tablet Take 0.5 tablets (15 mg total) by mouth daily. 04/08/18   Salary, Avel Peace, MD  lansoprazole (PREVACID) 30 MG capsule Take 30 mg by mouth 2 (two) times daily as needed.  11/03/17 11/03/18  [provider]  lisinopril (ZESTRIL) 2.5 MG tablet Take 1 tablet (2.5 mg total) by mouth daily. 09/20/17 09/20/18  Dustin Flock, MD   lovastatin (MEVACOR) 20 MG tablet Take 20 mg by mouth daily at 6 PM.    [provider]  metFORMIN (GLUCOPHAGE) 500 MG tablet Take 500 mg by mouth 2 (two) times daily with a meal.    [provider]  Multiple Vitamin (MULTI-VITAMINS) TABS Take 1 tablet by mouth daily.    [provider]  potassium chloride SA (K-DUR,KLOR-CON) 20 MEQ tablet Take 1 tablet (20 mEq total) by mouth daily. 09/20/17   Dustin Flock, MD  ticagrelor (BRILINTA) 90 MG TABS tablet Take 1 tablet (90 mg total) by mouth 2 (two) times daily. 04/08/18   Salary, Avel Peace, MD    Inpatient Medications:  . acyclovir  200 mg Oral BID  . aspirin EC  81 mg Oral Daily  . atorvastatin  80 mg Oral Daily  . carvedilol  3.125 mg Oral BID WC  . [START ON 08/24/2018] dexamethasone  8.5 mg Oral Once per day on Thu Fri  . enoxaparin (LOVENOX) injection  40 mg Subcutaneous Q24H  . finasteride  2.5 mg Oral QHS  . insulin aspart  0-5 Units Subcutaneous QHS  . insulin aspart  0-9 Units Subcutaneous TID WC  .  isosorbide mononitrate  15 mg Oral Daily  . lisinopril  2.5 mg Oral Daily  . metFORMIN  500 mg Oral BID WC  . multivitamin with minerals  1 tablet Oral Daily  . pantoprazole  40 mg Oral Daily  . potassium chloride SA  20 mEq Oral Daily  . ticagrelor  90 mg Oral BID     Allergies:  Allergies  Allergen Reactions  . Antihistamines, Chlorpheniramine-Type Other (See Comments)    Reaction: unknown  . Nitroglycerin Other (See Comments)    Reaction: hypotension    Social History   Socioeconomic History  . Marital status: Married    Spouse name: Not on file  . Number of children: Not on file  . Years of education: Not on file  . Highest education level: Not on file  Occupational History  . Occupation: retired  Scientific laboratory technician  . Financial resource strain: Not on file  . Food insecurity:    Worry: Not on file    Inability: Not on file  . Transportation needs:    Medical: Not on file     Non-medical: Not on file  Tobacco Use  . Smoking status: Former Smoker    Last attempt to quit: 2015    Years since quitting: 4.7  . Smokeless tobacco: Former Network engineer and Sexual Activity  . Alcohol use: No  . Drug use: No  . Sexual activity: Not on file  Lifestyle  . Physical activity:    Days per week: Not on file    Minutes per session: Not on file  . Stress: Not on file  Relationships  . Social connections:    Talks on phone: Not on file    Gets together: Not on file    Attends religious service: Not on file    Active member of club or organization: Not on file    Attends meetings of clubs or organizations: Not on file    Relationship status: Not on file  . Intimate partner violence:    Fear of current or ex partner: Not on file    Emotionally abused: Not on file    Physically abused: Not on file    Forced sexual activity: Not on file  Other Topics Concern  . Not on file  Social History Narrative  . Not on file     Family History  Problem Relation Age of Onset  . Heart disease Other   . Heart disease Father      Review of Systems: A 12-system review of systems was performed and is negative except as noted in the HPI.  Labs: Recent Labs    08/19/18 1315 08/19/18 1546 08/19/18 2056 08/20/18 0247  TROPONINI <0.03 <0.03 <0.03 <0.03   Lab Results  Component Value Date   WBC 14.5 (H) 08/19/2018   HGB 9.2 (L) 08/19/2018   HCT 27.6 (L) 08/19/2018   MCV 88.4 08/19/2018   PLT 121 (L) 08/19/2018    Recent Labs  Lab 08/19/18 1315  NA 135  K 4.3  CL 106  CO2 20*  BUN 22  CREATININE 1.22  CALCIUM 8.5*  PROT 6.9  BILITOT 0.3  ALKPHOS 62  ALT 16  AST 26  GLUCOSE 349*   Lab Results  Component Value Date   CHOL 79 01/14/2018   HDL 38 (L) 01/14/2018   LDLCALC 27 01/14/2018   TRIG 72 01/14/2018   No results found for: DDIMER  Radiology/Studies:  Dg Chest Port 1 View  Result Date:  08/19/2018 CLINICAL DATA:  Chest pain. History of  myocardial infarction and bypass. History of prostate cancer. EXAM: PORTABLE CHEST 1 VIEW COMPARISON:  Radiographs 04/07/2018 and 01/14/2018.  CT 12/29/2017. FINDINGS: 1329 hours. The heart size and mediastinal contours are stable status post median sternotomy and CABG. Loop recorder overlies the left ventricle. There is stable mild chronic interstitial prominence throughout the lungs. No edema, confluent airspace opacity, pleural effusion or pneumothorax. No acute osseous findings are seen. IMPRESSION: Stable postoperative chest.  No acute cardiopulmonary process. Electronically Signed   By: Richardean Sale M.D.   On: 08/19/2018 13:49    Wt Readings from Last 3 Encounters:  08/20/18 73.9 kg  04/08/18 61.2 kg  01/18/18 64.8 kg    EKG: Sinus rhythm with left bundle branch block  Physical Exam:  Blood pressure (!) 151/57, pulse (!) 57, temperature 97.7 F (36.5 C), temperature source Oral, resp. rate 18, height '5\' 7"'  (1.702 m), weight 73.9 kg, SpO2 100 %. Body mass index is 25.53 kg/m. General: Well developed, well nourished, in no acute distress. Head: Normocephalic, atraumatic, sclera non-icteric, no xanthomas, nares are without discharge.  Neck: Negative for carotid bruits. JVD not elevated. Lungs: Clear bilaterally to auscultation without wheezes, rales, or rhonchi. Breathing is unlabored. Heart: RRR with S1 S2. No murmurs, rubs, or gallops appreciated. Abdomen: Soft, non-tender, non-distended with normoactive bowel sounds. No hepatomegaly. No rebound/guarding. No obvious abdominal masses. Msk:  Strength and tone appear normal for age. Extremities: No clubbing or cyanosis. No edema.  Distal pedal pulses are 2+ and equal bilaterally. Neuro: Alert and oriented X 3. No facial asymmetry. No focal deficit. Moves all extremities spontaneously. Psych:  Responds to questions appropriately with a normal affect.     Assessment and Plan  Patient with history of coronary disease status post  coronary bypass grafting and PCI of the RCA on multiple occasions.  Presented with chest pain atypical for angina.  Patient is ruled out for myocardial infarction.  EKG shows left bundle.  Pain was relieved with GI cocktail.  He is doing well with no further chest pain.  At this point I think he is stable from a cardiac standpoint.  Would ambulate and consider discharge on current regimen.  Recommend follow-up with Dr. Saralyn Pilar in 1 week.  Signed, Teodoro Spray MD 08/20/2018, 8:43 AM Pager: [phone]

## 2018-08-20 NOTE — Discharge Summary (Signed)
Dwayne Terry NAME: Dwayne Terry    MR#:  [id number]  DATE OF BIRTH:  01/01/1930  DATE OF ADMISSION:  08/19/2018 ADMITTING PHYSICIAN: Nicholes Mango, MD  DATE OF DISCHARGE: 89/22/2019  PRIMARY CARE PHYSICIAN: Kirk Ruths, MD    ADMISSION DIAGNOSIS:  Chest pain [R07.9] Chest pain, unspecified type [R07.9]  DISCHARGE DIAGNOSIS:  Atypical chest pain GERD CAD s/p most recent stent in may 2019  SECONDARY DIAGNOSIS:   Past Medical History:  Diagnosis Date  . Cancer ([place])    bone marrow, prostate cancer  . Diabetes mellitus without complication ([place])   . Hyperlipidemia   . MI (mitral incompetence)   . MI (myocardial infarction) [facility])     HOSPITAL COURSE:  Dwayne Terry  is a 84 y.o. male with a known history of coronary artery disease, history of left bundle branch block no EKG changes from previous EKG in May 2019 status post stent placement at [facility] in May 2019 , bone marrow and prostate cancer on chemotherapy is presenting to the ED with a chief complaint of nonradiating midsternal chest pain which lasted for 2 hours  *Chest pain with historyof coronary artery disease status post stent placement in May 2019 - troponin x3  is negative and patient is symptom-free  during my examination. -sats good - nitroglycerin as needed -Aspirin, brilinta, ace inhibitor, beta-blocker and statin Morphine as needed for severe pain k[facility] cardiology consult appreciated. No further w/u needed Felt better after GI cocktail  *History of coronary artery disease status post stent placement with history of old left bundle branch block Continue home medication Coreg, aspirin, Brilinta, lisinopril, statin and Imdur  *Diabetes mellitus Sliding scale insulin  -pt takes metformin as needed  *Hypertension continue Coreg and lisinopril and titrate as needed  *Hyperlipidemia continue statin  Overall stable Ok to d/c from  cardiology standpoint CONSULTS OBTAINED:  Treatment Team:  Teodoro Spray, MD  DRUG ALLERGIES:   Allergies  Allergen Reactions  . Antihistamines, Chlorpheniramine-Type Other (See Comments)    Reaction: unknown  . Nitroglycerin Other (See Comments)    Reaction: hypotension    DISCHARGE MEDICATIONS:   Allergies as of 08/20/2018      Reactions   Antihistamines, Chlorpheniramine-type Other (See Comments)   Reaction: unknown   Nitroglycerin Other (See Comments)   Reaction: hypotension      Medication List    STOP taking these medications   lovastatin 20 MG tablet Commonly known as:  MEVACOR   metFORMIN 500 MG tablet Commonly known as:  GLUCOPHAGE   potassium chloride SA 20 MEQ tablet Commonly known as:  K-DUR,KLOR-CON     TAKE these medications   acyclovir 200 MG capsule Commonly known as:  ZOVIRAX Take 200 mg by mouth 2 (two) times daily.   ALPRAZolam 0.25 MG tablet Commonly known as:  XANAX Take 1 tablet (0.25 mg total) by mouth 2 (two) times daily as needed for anxiety.   aspirin EC 81 MG tablet Take 1 tablet (81 mg total) by mouth daily.   atorvastatin 80 MG tablet Commonly known as:  LIPITOR Take 1 tablet by mouth daily.   carvedilol 3.125 MG tablet Commonly known as:  COREG Take 1 tablet (3.125 mg total) by mouth 2 (two) times daily with a meal.   dexamethasone 2 MG tablet Commonly known as:  DECADRON Take 8.5 mg by mouth See admin instructions. Take 8.5 mg on Thursday and Friday.   finasteride 5 MG  tablet Commonly known as:  PROSCAR Take 2.5 mg by mouth daily.   isosorbide mononitrate 30 MG 24 hr tablet Commonly known as:  IMDUR Take 0.5 tablets (15 mg total) by mouth daily.   lansoprazole 30 MG capsule Commonly known as:  PREVACID Take 30 mg by mouth 2 (two) times daily as needed.   lisinopril 2.5 MG tablet Commonly known as:  PRINIVIL,ZESTRIL Take 1 tablet (2.5 mg total) by mouth daily.   MULTI-VITAMINS Tabs Take 1 tablet by mouth  daily.   ticagrelor 90 MG Tabs tablet Commonly known as:  BRILINTA Take 1 tablet (90 mg total) by mouth 2 (two) times daily.       If you experience worsening of your admission symptoms, develop shortness of breath, life threatening emergency, suicidal or homicidal thoughts you must seek medical attention immediately by calling 911 or calling your MD immediately  if symptoms less severe.  You Must read complete instructions/literature along with all the possible adverse reactions/side effects for all the Medicines you take and that have been prescribed to you. Take any new Medicines after you have completely understood and accept all the possible adverse reactions/side effects.   Please note  You were cared for by a hospitalist during your hospital stay. If you have any questions about your discharge medications or the care you received while you were in the hospital after you are discharged, you can call the unit and asked to speak with the hospitalist on call if the hospitalist that took care of you is not available. Once you are discharged, your primary care physician will handle any further medical issues. Please note that NO REFILLS for any discharge medications will be authorized once you are discharged, as it is imperative that you return to your primary care physician (or establish a relationship with a primary care physician if you do not have one) for your aftercare needs so that they can reassess your need for medications and monitor your lab values. Today   SUBJECTIVE   Doing well  VITAL SIGNS:  Blood pressure (!) 151/57, pulse (!) 57, temperature 97.7 F (36.5 C), temperature source Oral, resp. rate 18, height 5\' 7"  (1.702 m), weight 73.9 kg, SpO2 100 %.  I/O:    Intake/Output Summary (Last 24 hours) at 08/20/2018 1021 Last data filed at 08/20/2018 1016 Gross per 24 hour  Intake 240 ml  Output 2000 ml  Net -1760 ml    PHYSICAL EXAMINATION:  GENERAL:  84 y.o.-year-old  patient lying in the bed with no acute distress.  EYES: Pupils equal, round, reactive to light and accommodation. No scleral icterus. Extraocular muscles intact.  HEENT: Head atraumatic, normocephalic. Oropharynx and nasopharynx clear.  NECK:  Supple, no jugular venous distention. No thyroid enlargement, no tenderness.  LUNGS: Normal breath sounds bilaterally, no wheezing, rales,rhonchi or crepitation. No use of accessory muscles of respiration.  CARDIOVASCULAR: S1, S2 normal. No murmurs, rubs, or gallops.  ABDOMEN: Soft, non-tender, non-distended. Bowel sounds present. No organomegaly or mass.  EXTREMITIES: No pedal edema, cyanosis, or clubbing.  NEUROLOGIC: Cranial nerves II through XII are intact. Muscle strength 5/5 in all extremities. Sensation intact. Gait not checked.  PSYCHIATRIC: The patient is alert and oriented x 3.  SKIN: No obvious rash, lesion, or ulcer.   DATA REVIEW:   CBC  Recent Labs  Lab 08/19/18 1315  WBC 14.5*  HGB 9.2*  HCT 27.6*  PLT 121*    Chemistries  Recent Labs  Lab 08/19/18 1315  NA 135  K 4.3  CL 106  CO2 20*  GLUCOSE 349*  BUN 22  CREATININE 1.22  CALCIUM 8.5*  AST 26  ALT 16  ALKPHOS 62  BILITOT 0.3    Microbiology Results   No results found for this or any previous visit (from the past 240 hour(s)).  RADIOLOGY:  Dg Chest Port 1 View  Result Date: 08/19/2018 CLINICAL DATA:  Chest pain. History of myocardial infarction and bypass. History of prostate cancer. EXAM: PORTABLE CHEST 1 VIEW COMPARISON:  Radiographs 04/07/2018 and 01/14/2018.  CT 12/29/2017. FINDINGS: 1329 hours. The heart size and mediastinal contours are stable status post median sternotomy and CABG. Loop recorder overlies the left ventricle. There is stable mild chronic interstitial prominence throughout the lungs. No edema, confluent airspace opacity, pleural effusion or pneumothorax. No acute osseous findings are seen. IMPRESSION: Stable postoperative chest.  No acute  cardiopulmonary process. Electronically Signed   By: Richardean Sale M.D.   On: 08/19/2018 13:49     Management plans discussed with the patient, family and they are in agreement.  CODE STATUS:     Code Status Orders  (From admission, onward)         Start     Ordered   08/19/18 1555  Do not attempt resuscitation (DNR)  Continuous    Question Answer Comment  In the event of cardiac or respiratory ARREST Do not call a "code blue"   In the event of cardiac or respiratory ARREST Do not perform Intubation, CPR, defibrillation or ACLS   In the event of cardiac or respiratory ARREST Use medication by any route, position, wound care, and other measures to relive pain and suffering. May use oxygen, suction and manual treatment of airway obstruction as needed for comfort.   Comments RN may pronounce      08/19/18 1555        Code Status History    Date Active Date Inactive Code Status Order ID Comments User Context   08/19/2018 1512 08/19/2018 1555 DNR [id number]  Nicholes Mango, MD ED   04/07/2018 1620 04/08/2018 1645 Full Code [id number]  Saundra Shelling, MD ED   01/16/2018 1352 01/18/2018 1914 Full Code [id number]  Epifanio Lesches, MD ED   01/14/2018 0335 01/14/2018 1754 Full Code [id number]  Salary, Avel Peace, MD Inpatient   12/30/2017 0220 12/31/2017 1703 Full Code [id number]  Amelia Jo, MD Inpatient   10/29/2017 0228 10/31/2017 1900 Full Code [id number]  Lance Coon, MD ED   09/17/2017 0058 09/20/2017 1524 Full Code [id number]  Lance Coon, MD Inpatient   06/10/2017 0324 06/11/2017 1613 Full Code [id number]  Harrie Foreman, MD Inpatient   04/29/2017 0512 04/30/2017 1437 Full Code [id number]  Harrie Foreman, MD Inpatient   01/27/2017 2356 01/29/2017 1430 Full Code [id number]  Hugelmeyer, Ubaldo Glassing, DO Inpatient    Advance Directive Documentation     Most Recent Value  Type of Advance Directive  Healthcare Power of Attorney  Pre-existing out of facility DNR order (yellow form or pink MOST form)  -   "MOST" Form in Place?  -      TOTAL TIME TAKING CARE OF THIS PATIENT: *40* minutes.    Fritzi Mandes M.D on 08/20/2018 at 10:21 AM  Between 7am to 6pm - Pager - [phone] After 6pm go to [email or web address] - password [email or web address] [place] [facility]  Office  [phone]  CC: Primary care physician; Kirk Ruths, MD

## 2018-09-15 ENCOUNTER — Other Ambulatory Visit: Payer: Self-pay

## 2018-09-15 ENCOUNTER — Encounter: Payer: Self-pay | Admitting: Emergency Medicine

## 2018-09-15 ENCOUNTER — Emergency Department: Payer: Medicare HMO

## 2018-09-15 ENCOUNTER — Inpatient Hospital Stay
Admission: EM | Admit: 2018-09-15 | Discharge: 2018-09-19 | DRG: 281 | Disposition: A | Discharge status: Home Health | Payer: Medicare HMO | Attending: Internal Medicine | Admitting: Internal Medicine

## 2018-09-15 DIAGNOSIS — I447 Left bundle-branch block, unspecified: Secondary | ICD-10-CM | POA: Diagnosis present

## 2018-09-15 DIAGNOSIS — Z888 Allergy status to other drugs, medicaments and biological substances status: Secondary | ICD-10-CM

## 2018-09-15 DIAGNOSIS — C9000 Multiple myeloma not having achieved remission: Secondary | ICD-10-CM | POA: Diagnosis present

## 2018-09-15 DIAGNOSIS — F419 Anxiety disorder, unspecified: Secondary | ICD-10-CM | POA: Diagnosis present

## 2018-09-15 DIAGNOSIS — N183 Chronic kidney disease, stage 3 (moderate): Secondary | ICD-10-CM | POA: Diagnosis present

## 2018-09-15 DIAGNOSIS — Z951 Presence of aortocoronary bypass graft: Secondary | ICD-10-CM

## 2018-09-15 DIAGNOSIS — E782 Mixed hyperlipidemia: Secondary | ICD-10-CM | POA: Diagnosis present

## 2018-09-15 DIAGNOSIS — R079 Chest pain, unspecified: Secondary | ICD-10-CM | POA: Diagnosis not present

## 2018-09-15 DIAGNOSIS — I214 Non-ST elevation (NSTEMI) myocardial infarction: Secondary | ICD-10-CM | POA: Diagnosis not present

## 2018-09-15 DIAGNOSIS — Z87891 Personal history of nicotine dependence: Secondary | ICD-10-CM

## 2018-09-15 DIAGNOSIS — E1122 Type 2 diabetes mellitus with diabetic chronic kidney disease: Secondary | ICD-10-CM | POA: Diagnosis present

## 2018-09-15 DIAGNOSIS — I252 Old myocardial infarction: Secondary | ICD-10-CM

## 2018-09-15 DIAGNOSIS — Z952 Presence of prosthetic heart valve: Secondary | ICD-10-CM

## 2018-09-15 DIAGNOSIS — I2510 Atherosclerotic heart disease of native coronary artery without angina pectoris: Secondary | ICD-10-CM | POA: Diagnosis present

## 2018-09-15 DIAGNOSIS — Z7982 Long term (current) use of aspirin: Secondary | ICD-10-CM

## 2018-09-15 DIAGNOSIS — Z66 Do not resuscitate: Secondary | ICD-10-CM | POA: Diagnosis present

## 2018-09-15 DIAGNOSIS — Z955 Presence of coronary angioplasty implant and graft: Secondary | ICD-10-CM

## 2018-09-15 DIAGNOSIS — Z7902 Long term (current) use of antithrombotics/antiplatelets: Secondary | ICD-10-CM

## 2018-09-15 DIAGNOSIS — Z8249 Family history of ischemic heart disease and other diseases of the circulatory system: Secondary | ICD-10-CM

## 2018-09-15 DIAGNOSIS — K219 Gastro-esophageal reflux disease without esophagitis: Secondary | ICD-10-CM | POA: Diagnosis present

## 2018-09-15 DIAGNOSIS — C61 Malignant neoplasm of prostate: Secondary | ICD-10-CM | POA: Diagnosis present

## 2018-09-15 DIAGNOSIS — C7951 Secondary malignant neoplasm of bone: Secondary | ICD-10-CM | POA: Diagnosis present

## 2018-09-15 LAB — BASIC METABOLIC PANEL
ANION GAP: 8 (ref 5–15)
BUN: 28 mg/dL — ABNORMAL HIGH (ref 8–23)
CALCIUM: 8.7 mg/dL — AB (ref 8.9–10.3)
CHLORIDE: 108 mmol/L (ref 98–111)
CO2: 21 mmol/L — AB (ref 22–32)
Creatinine, Ser: 1.35 mg/dL — ABNORMAL HIGH (ref 0.61–1.24)
GFR calc non Af Amer: 46 mL/min — ABNORMAL LOW (ref >60)
GFR, EST AFRICAN AMERICAN: 53 mL/min — AB (ref >60)
Glucose, Bld: 316 mg/dL — ABNORMAL HIGH (ref 70–99)
Potassium: 4.3 mmol/L (ref 3.5–5.1)
SODIUM: 137 mmol/L (ref 135–145)

## 2018-09-15 LAB — CBC
HCT: 29 % — ABNORMAL LOW (ref 39.0–52.0)
Hemoglobin: 9.4 g/dL — ABNORMAL LOW (ref 13.0–17.0)
MCH: 28.2 pg (ref 26.0–34.0)
MCHC: 32.4 g/dL (ref 30.0–36.0)
MCV: 87.1 fL (ref 80.0–100.0)
NRBC: 0 % (ref 0.0–0.2)
Platelets: 118 10*3/uL — ABNORMAL LOW (ref 150–400)
RBC: 3.33 MIL/uL — AB (ref 4.22–5.81)
RDW: 15.9 % — ABNORMAL HIGH (ref 11.5–15.5)
WBC: 13.1 10*3/uL — AB (ref 4.0–10.5)

## 2018-09-15 LAB — TROPONIN I: Troponin I: 0.05 ng/mL (ref <0.03)

## 2018-09-15 MED ORDER — ONDANSETRON HCL 4 MG/2ML IJ SOLN
4.0000 mg | Freq: Once | INTRAMUSCULAR | Status: AC
  Administered 2018-09-15: 4 mg via INTRAVENOUS
  Filled 2018-09-15: qty 2

## 2018-09-15 NOTE — ED Provider Notes (Signed)
[facility] Emergency Department Provider Note  Time seen: 10:28 PM  I have reviewed the triage vital signs and the nursing notes.   HISTORY  Chief Complaint Nausea; Emesis; and Chest Pain    HPI Dwayne Terry is a 84 y.o. male with a past medical history of diabetes, hyperlipidemia, renal insufficiency, chest pain, multiple MIs in the past, status post multiple stents patient states greater than 10 stents, status post CABG, presents to the emergency department for chest pain nausea vomiting.  According to the patient around 10:00 this morning he developed a mild to moderate dull chest pressure along with nausea and vomiting.  Estimates he has vomited nearly 15 times today, states several of which appeared to have blood streaking although states the most recent vomit appeared clear.  Denies any increased shortness of breath although states chronic shortness of breath.  Denies any diaphoresis.  Describes her chest discomfort as mild currently.   Past Medical History:  Diagnosis Date  . Cancer ([place])    bone marrow, prostate cancer  . Diabetes mellitus without complication ([place])   . Hyperlipidemia   . MI (mitral incompetence)   . MI (myocardial infarction) [facility])     Patient Active Problem List   Diagnosis Date Noted  . Acute renal failure (ARF) ([place]) 01/16/2018  . Pulmonary edema 12/30/2017  . HLD (hyperlipidemia) 09/16/2017  . Diabetes ([place]) 09/16/2017  . Chest pain 04/29/2017  . NSTEMI (non-ST elevated myocardial infarction) ([place]) 04/29/2017  . Presence of coronary angioplasty implant and graft 03/21/2017  . Chest pain, rule out acute myocardial infarction 01/27/2017  . Dyspnea on exertion 09/06/2014  . Malignant neoplasm of prostate ([place]) 05/09/2014  . Atherosclerotic heart disease of native coronary artery without angina pectoris 07/10/2013  . Essential (primary) hypertension 07/10/2013    Past Surgical History:  Procedure Laterality Date  .  AORTIC VALVE REPLACEMENT (AVR)/CORONARY ARTERY BYPASS GRAFTING (CABG)    . CORONARY ANGIOPLASTY WITH STENT PLACEMENT    . CORONARY BALLOON ANGIOPLASTY N/A 06/10/2017   Procedure: Coronary Balloon Angioplasty;  Surgeon: Wellington Hampshire, MD;  Location: [place] [facility];  Service: Cardiovascular;  Laterality: N/A;  . CORONARY STENT INTERVENTION N/A 01/28/2017   Procedure: Coronary Stent Intervention;  Surgeon: [name], MD;  Location: [place] [facility];  Service: Cardiovascular;  Laterality: N/A;  . CORONARY STENT INTERVENTION N/A 04/29/2017   Procedure: Coronary Stent Intervention;  Surgeon: [name], MD;  Location: [place] [facility];  Service: Cardiovascular;  Laterality: N/A;  . CORONARY STENT INTERVENTION N/A 09/19/2017   Procedure: CORONARY/GRAFT ANGIOGRAPHY;  Surgeon: Corey Skains, MD;  Location: [place] [facility];  Service: Cardiovascular;  Laterality: N/A;  . CORONARY STENT INTERVENTION N/A 09/19/2017   Procedure: CORONARY STENT INTERVENTION;  Surgeon: Wellington Hampshire, MD;  Location: [place] [facility];  Service: Cardiovascular;  Laterality: N/A;  . LEFT HEART CATH AND CORONARY ANGIOGRAPHY N/A 01/28/2017   Procedure: Left Heart Cath and Coronary Angiography;  Surgeon: [name], MD;  Location: [place] [facility];  Service: Cardiovascular;  Laterality: N/A;  . LEFT HEART CATH AND CORONARY ANGIOGRAPHY N/A 10/31/2017   Procedure: LEFT HEART CATH AND CORONARY ANGIOGRAPHY;  Surgeon: Teodoro Spray, MD;  Location: [place] [facility];  Service: Cardiovascular;  Laterality: N/A;  . LEFT HEART CATH AND CORS/GRAFTS ANGIOGRAPHY N/A 04/29/2017   Procedure: Left Heart Cath and Cors/Grafts Angiography;  Surgeon: [name], MD;  Location: [place] [facility];  Service: Cardiovascular;  Laterality:  N/A;  . LEFT HEART CATH AND CORS/GRAFTS ANGIOGRAPHY N/A 06/10/2017   Procedure: Left Heart Cath and Cors/Grafts Angiography;  Surgeon: Corey Skains, MD;  Location: [place] [facility];  Service: Cardiovascular;  Laterality: N/A;  . LEFT HEART CATH AND CORS/GRAFTS ANGIOGRAPHY N/A 09/19/2017   Procedure: LEFT HEART CATH;  Surgeon: Corey Skains, MD;  Location: [place] [facility];  Service: Cardiovascular;  Laterality: N/A;    Prior to Admission medications   Medication Sig Start Date End Date Taking? Authorizing Provider  acyclovir (ZOVIRAX) 200 MG capsule Take 200 mg by mouth 2 (two) times daily.    [provider]  ALPRAZolam Duanne Moron) 0.25 MG tablet Take 1 tablet (0.25 mg total) by mouth 2 (two) times daily as needed for anxiety. 01/14/18   Vaughan Basta, MD  aspirin EC 81 MG tablet Take 1 tablet (81 mg total) by mouth daily. 01/29/17   Loletha Grayer, MD  atorvastatin (LIPITOR) 80 MG tablet Take 1 tablet by mouth daily. 05/18/18   [provider]  carvedilol (COREG) 3.125 MG tablet Take 1 tablet (3.125 mg total) by mouth 2 (two) times daily with a meal. 12/31/17   Epifanio Lesches, MD  dexamethasone (DECADRON) 2 MG tablet Take 8.5 mg by mouth See admin instructions. Take 8.5 mg on Thursday and Friday.    [provider]  finasteride (PROSCAR) 5 MG tablet Take 2.5 mg by mouth daily.     [provider]  isosorbide mononitrate (IMDUR) 30 MG 24 hr tablet Take 0.5 tablets (15 mg total) by mouth daily. 04/08/18   Salary, Avel Peace, MD  lansoprazole (PREVACID) 30 MG capsule Take 30 mg by mouth 2 (two) times daily as needed.  11/03/17 11/03/18  [provider]  lisinopril (ZESTRIL) 2.5 MG tablet Take 1 tablet (2.5 mg total) by mouth daily. 09/20/17 09/20/18  Dustin Flock, MD  Multiple Vitamin (MULTI-VITAMINS) TABS Take 1 tablet by mouth daily.    [provider]  ticagrelor (BRILINTA) 90 MG TABS tablet Take 1 tablet (90 mg total) by mouth 2 (two) times daily. 04/08/18   Salary, Avel Peace, MD    Allergies  Allergen Reactions  . Antihistamines, Chlorpheniramine-Type Other (See  Comments)    Reaction: unknown  . Nitroglycerin Other (See Comments)    Reaction: hypotension    Family History  Problem Relation Age of Onset  . Heart disease Other   . Heart disease Father     Social History Social History   Tobacco Use  . Smoking status: Former Smoker    Last attempt to quit: 2015    Years since quitting: 4.7  . Smokeless tobacco: Former Network engineer Use Topics  . Alcohol use: No  . Drug use: No    Review of Systems Constitutional: Negative for fever. Cardiovascular: Positive for chest pain since this morning Respiratory: Chronic shortness of breath. Gastrointestinal: Negative for abdominal pain.  Positive for nausea vomiting. Musculoskeletal: Negative for musculoskeletal complaints Neurological: Negative for headache All other ROS negative  ____________________________________________   PHYSICAL EXAM:  VITAL SIGNS: ED Triage Vitals  Enc Vitals Group     BP 09/15/18 2216 (!) 154/60     Pulse Rate 09/15/18 2216 64     Resp 09/15/18 2216 18     Temp 09/15/18 2216 97.8 F (36.6 C)     Temp Source 09/15/18 2216 Oral     SpO2 09/15/18 2216 99 %     Weight 09/15/18 2217 153 lb (69.4 kg)  Height 09/15/18 2217 5' 7.5" (1.715 m)     Head Circumference --      Peak Flow --      Pain Score 09/15/18 2217 1     Pain Loc --      Pain Edu? --      Excl. in [place]? --    Constitutional: Alert and oriented. Well appearing and in no distress. Eyes: Normal exam ENT   Head: Normocephalic and atraumatic.   Mouth/Throat: Mucous membranes are moist. Cardiovascular: Normal rate, regular rhythm. Respiratory: Normal respiratory effort without tachypnea nor retractions. Breath sounds are clear  Gastrointestinal: Soft and nontender. No distention.   Musculoskeletal: Nontender with normal range of motion in all extremities. No lower extremity tenderness Neurologic:  Normal speech and language. No gross focal neurologic deficits  Skin:  Skin is warm,  dry and intact.  Psychiatric: Mood and affect are normal.   ____________________________________________    EKG  EKG reviewed and interpreted by myself shows a normal sinus rhythm at 67 bpm with a widened QRS, left axis deviation, slight QTC prolongation, morphology consistent with left bundle branch block.  Morphology largely unchanged from prior EKG 08/19/2018.  ____________________________________________    RADIOLOGY  Chest x-ray negative  ____________________________________________   INITIAL IMPRESSION / ASSESSMENT AND PLAN / ED COURSE  Pertinent labs & imaging results that were available during my care of the patient were reviewed by me and considered in my medical decision making (see chart for details).  Patient presents the emergency department for chest pain nausea vomiting since 10:00 this morning.  Patient has a long well-established cardiac history, differential would be concerning for ACS, differential would also include enteritis, gastroenteritis, gastritis, chest wall pain, pneumonia, pneumothorax.  We will check labs, obtain a two-view chest x-ray.  Reassuringly patient's EKG is largely unchanged from prior.  Patient's work-up shows a mild leukocytosis of 13,000, H&H is anemic although unchanged from baseline.  Troponin is slightly elevated 0.05 however his baseline appears to be normal.  Given high risk chest pain with very minimally elevated troponin we will admit to the hospital for further treatment.  After discussing this with the patient he states his care is now at the [place] in [place].  He would like to be transferred to the [place] if possible.  I have filled out a [place] transfer packet for the patient.  Patient care signed out to oncoming physician.  ____________________________________________   FINAL CLINICAL IMPRESSION(S) / ED DIAGNOSES  Chest pain Nausea vomiting    Harvest Dark, MD 09/15/18 2326

## 2018-09-15 NOTE — ED Triage Notes (Signed)
EMS pt to rm 26 from home with report of n/v today. Reports he has vomited numerous times. Reports earlier the emesis looked bloody but the last few times it has been clear. Pt also reports he has chest pressure over the last few days.

## 2018-09-16 DIAGNOSIS — I2510 Atherosclerotic heart disease of native coronary artery without angina pectoris: Secondary | ICD-10-CM | POA: Diagnosis present

## 2018-09-16 DIAGNOSIS — K219 Gastro-esophageal reflux disease without esophagitis: Secondary | ICD-10-CM | POA: Diagnosis present

## 2018-09-16 DIAGNOSIS — Z87891 Personal history of nicotine dependence: Secondary | ICD-10-CM | POA: Diagnosis not present

## 2018-09-16 DIAGNOSIS — F419 Anxiety disorder, unspecified: Secondary | ICD-10-CM | POA: Diagnosis present

## 2018-09-16 DIAGNOSIS — Z888 Allergy status to other drugs, medicaments and biological substances status: Secondary | ICD-10-CM | POA: Diagnosis not present

## 2018-09-16 DIAGNOSIS — E782 Mixed hyperlipidemia: Secondary | ICD-10-CM | POA: Diagnosis present

## 2018-09-16 DIAGNOSIS — Z951 Presence of aortocoronary bypass graft: Secondary | ICD-10-CM | POA: Diagnosis not present

## 2018-09-16 DIAGNOSIS — C61 Malignant neoplasm of prostate: Secondary | ICD-10-CM | POA: Diagnosis present

## 2018-09-16 DIAGNOSIS — I447 Left bundle-branch block, unspecified: Secondary | ICD-10-CM | POA: Diagnosis present

## 2018-09-16 DIAGNOSIS — Z8249 Family history of ischemic heart disease and other diseases of the circulatory system: Secondary | ICD-10-CM | POA: Diagnosis not present

## 2018-09-16 DIAGNOSIS — Z7982 Long term (current) use of aspirin: Secondary | ICD-10-CM | POA: Diagnosis not present

## 2018-09-16 DIAGNOSIS — C9000 Multiple myeloma not having achieved remission: Secondary | ICD-10-CM | POA: Diagnosis present

## 2018-09-16 DIAGNOSIS — N183 Chronic kidney disease, stage 3 (moderate): Secondary | ICD-10-CM | POA: Diagnosis present

## 2018-09-16 DIAGNOSIS — C7951 Secondary malignant neoplasm of bone: Secondary | ICD-10-CM | POA: Diagnosis present

## 2018-09-16 DIAGNOSIS — Z952 Presence of prosthetic heart valve: Secondary | ICD-10-CM | POA: Diagnosis not present

## 2018-09-16 DIAGNOSIS — E1122 Type 2 diabetes mellitus with diabetic chronic kidney disease: Secondary | ICD-10-CM | POA: Diagnosis present

## 2018-09-16 DIAGNOSIS — R079 Chest pain, unspecified: Secondary | ICD-10-CM | POA: Diagnosis present

## 2018-09-16 DIAGNOSIS — I252 Old myocardial infarction: Secondary | ICD-10-CM | POA: Diagnosis not present

## 2018-09-16 DIAGNOSIS — I214 Non-ST elevation (NSTEMI) myocardial infarction: Secondary | ICD-10-CM | POA: Diagnosis present

## 2018-09-16 DIAGNOSIS — Z955 Presence of coronary angioplasty implant and graft: Secondary | ICD-10-CM | POA: Diagnosis not present

## 2018-09-16 DIAGNOSIS — Z66 Do not resuscitate: Secondary | ICD-10-CM | POA: Diagnosis present

## 2018-09-16 DIAGNOSIS — Z7902 Long term (current) use of antithrombotics/antiplatelets: Secondary | ICD-10-CM | POA: Diagnosis not present

## 2018-09-16 LAB — CBC
HCT: 28.1 % — ABNORMAL LOW (ref 39.0–52.0)
HEMOGLOBIN: 9.1 g/dL — AB (ref 13.0–17.0)
MCH: 28.3 pg (ref 26.0–34.0)
MCHC: 32.4 g/dL (ref 30.0–36.0)
MCV: 87.5 fL (ref 80.0–100.0)
Platelets: 108 10*3/uL — ABNORMAL LOW (ref 150–400)
RBC: 3.21 MIL/uL — AB (ref 4.22–5.81)
RDW: 16.1 % — ABNORMAL HIGH (ref 11.5–15.5)
WBC: 11.9 10*3/uL — AB (ref 4.0–10.5)
nRBC: 0 % (ref 0.0–0.2)

## 2018-09-16 LAB — HEPARIN LEVEL (UNFRACTIONATED): HEPARIN UNFRACTIONATED: 0.38 [IU]/mL (ref 0.30–0.70)

## 2018-09-16 LAB — GLUCOSE, CAPILLARY
Glucose-Capillary: 139 mg/dL — ABNORMAL HIGH (ref 70–99)
Glucose-Capillary: 163 mg/dL — ABNORMAL HIGH (ref 70–99)
Glucose-Capillary: 126 mg/dL — ABNORMAL HIGH (ref 70–99)
Glucose-Capillary: 157 mg/dL — ABNORMAL HIGH (ref 70–99)

## 2018-09-16 LAB — TROPONIN I
Troponin I: 0.26 ng/mL (ref <0.03)
Troponin I: 0.24 ng/mL (ref <0.03)
Troponin I: 0.22 ng/mL (ref <0.03)

## 2018-09-16 LAB — TSH: TSH: 0.272 u[IU]/mL — ABNORMAL LOW (ref 0.350–4.500)

## 2018-09-16 LAB — PROTIME-INR
INR: 1.1
Prothrombin Time: 14.1 seconds (ref 11.4–15.2)

## 2018-09-16 LAB — APTT: aPTT: 24 seconds (ref 24–36)

## 2018-09-16 MED ORDER — ACYCLOVIR 200 MG PO CAPS
200.0000 mg | ORAL_CAPSULE | Freq: Two times a day (BID) | ORAL | Status: DC
  Administered 2018-09-16 – 2018-09-19 (×6): 200 mg via ORAL
  Filled 2018-09-16 (×7): qty 1

## 2018-09-16 MED ORDER — SODIUM CHLORIDE 0.9% FLUSH
3.0000 mL | Freq: Two times a day (BID) | INTRAVENOUS | Status: DC
  Administered 2018-09-16 – 2018-09-19 (×4): 3 mL via INTRAVENOUS

## 2018-09-16 MED ORDER — ATORVASTATIN CALCIUM 20 MG PO TABS
80.0000 mg | ORAL_TABLET | Freq: Every day | ORAL | Status: DC
  Administered 2018-09-16 – 2018-09-19 (×4): 80 mg via ORAL
  Filled 2018-09-16 (×4): qty 4

## 2018-09-16 MED ORDER — MORPHINE SULFATE (PF) 2 MG/ML IV SOLN: 2.0000 mg | INTRAVENOUS | Status: DC | PRN

## 2018-09-16 MED ORDER — ENOXAPARIN SODIUM 40 MG/0.4ML ~~LOC~~ SOLN: 40.0000 mg | SUBCUTANEOUS | Status: DC

## 2018-09-16 MED ORDER — ALPRAZOLAM 0.5 MG PO TABS: 0.2500 mg | ORAL_TABLET | Freq: Two times a day (BID) | ORAL | Status: DC | PRN

## 2018-09-16 MED ORDER — ACETAMINOPHEN 650 MG RE SUPP: 650.0000 mg | Freq: Four times a day (QID) | RECTAL | Status: DC | PRN

## 2018-09-16 MED ORDER — INSULIN ASPART 100 UNIT/ML ~~LOC~~ SOLN
0.0000 [IU] | SUBCUTANEOUS | Status: DC
  Administered 2018-09-16 (×2): 1 [IU] via SUBCUTANEOUS
  Administered 2018-09-16: 2 [IU] via SUBCUTANEOUS
  Filled 2018-09-16 (×3): qty 1

## 2018-09-16 MED ORDER — HEPARIN (PORCINE) IN NACL 100-0.45 UNIT/ML-% IJ SOLN
750.0000 [IU]/h | INTRAMUSCULAR | Status: DC
  Administered 2018-09-16 – 2018-09-17 (×2): 750 [IU]/h via INTRAVENOUS
  Filled 2018-09-16 (×2): qty 250

## 2018-09-16 MED ORDER — ONDANSETRON HCL 4 MG PO TABS: 4.0000 mg | ORAL_TABLET | Freq: Four times a day (QID) | ORAL | Status: DC | PRN

## 2018-09-16 MED ORDER — MORPHINE SULFATE (PF) 2 MG/ML IV SOLN: 1.0000 mg | INTRAVENOUS | Status: DC | PRN

## 2018-09-16 MED ORDER — DOCUSATE SODIUM 100 MG PO CAPS
100.0000 mg | ORAL_CAPSULE | Freq: Two times a day (BID) | ORAL | Status: DC
  Administered 2018-09-16 – 2018-09-19 (×6): 100 mg via ORAL
  Filled 2018-09-16 (×7): qty 1

## 2018-09-16 MED ORDER — ACETAMINOPHEN 325 MG PO TABS: 650.0000 mg | ORAL_TABLET | Freq: Four times a day (QID) | ORAL | Status: DC | PRN

## 2018-09-16 MED ORDER — INSULIN ASPART 100 UNIT/ML ~~LOC~~ SOLN
0.0000 [IU] | Freq: Three times a day (TID) | SUBCUTANEOUS | Status: DC
  Administered 2018-09-16: 2 [IU] via SUBCUTANEOUS
  Administered 2018-09-17 – 2018-09-18 (×2): 1 [IU] via SUBCUTANEOUS
  Filled 2018-09-16 (×2): qty 1

## 2018-09-16 MED ORDER — ONDANSETRON HCL 4 MG/2ML IJ SOLN: 4.0000 mg | Freq: Four times a day (QID) | INTRAMUSCULAR | Status: DC | PRN

## 2018-09-16 MED ORDER — INSULIN ASPART 100 UNIT/ML ~~LOC~~ SOLN
0.0000 [IU] | Freq: Once | SUBCUTANEOUS | Status: DC
  Filled 2018-09-16: qty 1

## 2018-09-16 MED ORDER — FINASTERIDE 5 MG PO TABS
2.5000 mg | ORAL_TABLET | Freq: Every day | ORAL | Status: DC
  Administered 2018-09-16 – 2018-09-19 (×4): 2.5 mg via ORAL
  Filled 2018-09-16 (×3): qty 0.5
  Filled 2018-09-16 (×3): qty 1
  Filled 2018-09-16: qty 0.5
  Filled 2018-09-16: qty 1

## 2018-09-16 MED ORDER — ASPIRIN EC 81 MG PO TBEC
81.0000 mg | DELAYED_RELEASE_TABLET | Freq: Every day | ORAL | Status: DC
  Administered 2018-09-16 – 2018-09-19 (×4): 81 mg via ORAL
  Filled 2018-09-16 (×5): qty 1

## 2018-09-16 MED ORDER — HEPARIN BOLUS VIA INFUSION
3000.0000 [IU] | Freq: Once | INTRAVENOUS | Status: AC
  Administered 2018-09-16: 3000 [IU] via INTRAVENOUS
  Filled 2018-09-16: qty 3000

## 2018-09-16 MED ORDER — CARVEDILOL 3.125 MG PO TABS
3.1250 mg | ORAL_TABLET | Freq: Two times a day (BID) | ORAL | Status: DC
  Administered 2018-09-16 – 2018-09-19 (×7): 3.125 mg via ORAL
  Filled 2018-09-16 (×7): qty 1

## 2018-09-16 MED ORDER — TICAGRELOR 90 MG PO TABS
90.0000 mg | ORAL_TABLET | Freq: Two times a day (BID) | ORAL | Status: DC
  Administered 2018-09-16 – 2018-09-19 (×7): 90 mg via ORAL
  Filled 2018-09-16 (×7): qty 1

## 2018-09-16 MED ORDER — LISINOPRIL 5 MG PO TABS
2.5000 mg | ORAL_TABLET | Freq: Every day | ORAL | Status: DC
  Administered 2018-09-16 – 2018-09-19 (×4): 2.5 mg via ORAL
  Filled 2018-09-16 (×4): qty 1

## 2018-09-16 NOTE — Plan of Care (Signed)

## 2018-09-16 NOTE — ED Notes (Signed)
Pt and family updated on delay with process for transfer to the [place]. Pt states he is ok being admitted here. Dr Alfred Levins informed.

## 2018-09-16 NOTE — Consult Note (Signed)
[facility] Consultation Note  Patient ID: Dwayne Terry, MRN: [id number], DOB/AGE: 07-27-1930 84 y.o. Admit date: 09/15/2018   Date of Consult: 09/16/2018 Primary Physician: Kirk Ruths, MD Primary Cardiologist: Raynelle Chary  Chief Complaint:  Chief Complaint  Patient presents with  . Nausea  . Emesis  . Chest Pain   Reason for Consult: Chest pain  HPI: 84 y.o. male with known coronary artery disease status post coronary bypass graft and apparent aortic valve replacement in 1998.  Since then he has had multiple issues with his right coronary artery graft and stented in the right coronary artery with a non-ST elevation myocardial infarction.  In 2018 he had another issue and had multiple stents in his right coronary artery.  Since then has been on appropriate medication management for hypertension hyperlipidemia and chronic kidney disease stage III.  He has had treatment for paroxysmal nonvalvular atrial fibrillation as well.  Currently is not on anticoagulation.  He has had 2 days of waxing and waning chest pain and shortness of breath but 5 hours yesterday they have constant chest pressure and pain radiating into his back.  When arriving to the hospital he had an EKG showing normal sinus rhythm with left bundle branch block unchanged from before and the peak troponin 0 0.26.  This consistent with non-ST elevation myocardial infarction.  Currently he is on appropriate medication management and feeling better  Past Medical History:  Diagnosis Date  . Cancer ([place])    bone marrow, prostate cancer  . Diabetes mellitus without complication ([place])   . Hyperlipidemia   . MI (mitral incompetence)   . MI (myocardial infarction) [facility])       Surgical History:  Past Surgical History:  Procedure Laterality Date  . AORTIC VALVE REPLACEMENT (AVR)/CORONARY ARTERY BYPASS GRAFTING (CABG)    . CORONARY ANGIOPLASTY WITH STENT PLACEMENT    . CORONARY BALLOON ANGIOPLASTY N/A  06/10/2017   Procedure: Coronary Balloon Angioplasty;  Surgeon: Wellington Hampshire, MD;  Location: [place] [facility];  Service: Cardiovascular;  Laterality: N/A;  . CORONARY STENT INTERVENTION N/A 01/28/2017   Procedure: Coronary Stent Intervention;  Surgeon: [name], MD;  Location: [place] [facility];  Service: Cardiovascular;  Laterality: N/A;  . CORONARY STENT INTERVENTION N/A 04/29/2017   Procedure: Coronary Stent Intervention;  Surgeon: [name], MD;  Location: [place] [facility];  Service: Cardiovascular;  Laterality: N/A;  . CORONARY STENT INTERVENTION N/A 09/19/2017   Procedure: CORONARY/GRAFT ANGIOGRAPHY;  Surgeon: Corey Skains, MD;  Location: [place] [facility];  Service: Cardiovascular;  Laterality: N/A;  . CORONARY STENT INTERVENTION N/A 09/19/2017   Procedure: CORONARY STENT INTERVENTION;  Surgeon: Wellington Hampshire, MD;  Location: [place] [facility];  Service: Cardiovascular;  Laterality: N/A;  . LEFT HEART CATH AND CORONARY ANGIOGRAPHY N/A 01/28/2017   Procedure: Left Heart Cath and Coronary Angiography;  Surgeon: [name], MD;  Location: [place] [facility];  Service: Cardiovascular;  Laterality: N/A;  . LEFT HEART CATH AND CORONARY ANGIOGRAPHY N/A 10/31/2017   Procedure: LEFT HEART CATH AND CORONARY ANGIOGRAPHY;  Surgeon: Teodoro Spray, MD;  Location: [place] [facility];  Service: Cardiovascular;  Laterality: N/A;  . LEFT HEART CATH AND CORS/GRAFTS ANGIOGRAPHY N/A 04/29/2017   Procedure: Left Heart Cath and Cors/Grafts Angiography;  Surgeon: [name], MD;  Location: [place] [facility];  Service: Cardiovascular;  Laterality: N/A;  . LEFT HEART CATH AND CORS/GRAFTS ANGIOGRAPHY N/A 06/10/2017   Procedure: Left Heart Cath  and Cors/Grafts Angiography;  Surgeon: Corey Skains, MD;  Location: [place] [facility];  Service: Cardiovascular;  Laterality: N/A;  . LEFT HEART CATH AND CORS/GRAFTS ANGIOGRAPHY N/A 09/19/2017    Procedure: LEFT HEART CATH;  Surgeon: Corey Skains, MD;  Location: [place] [facility];  Service: Cardiovascular;  Laterality: N/A;     Home Meds: Prior to Admission medications   Medication Sig Start Date End Date Taking? Authorizing Provider  acyclovir (ZOVIRAX) 200 MG capsule Take 200 mg by mouth 2 (two) times daily.    [provider]  ALPRAZolam Duanne Moron) 0.25 MG tablet Take 1 tablet (0.25 mg total) by mouth 2 (two) times daily as needed for anxiety. 01/14/18   Vaughan Basta, MD  aspirin EC 81 MG tablet Take 1 tablet (81 mg total) by mouth daily. 01/29/17   Loletha Grayer, MD  atorvastatin (LIPITOR) 80 MG tablet Take 1 tablet by mouth daily. 05/18/18   [provider]  carvedilol (COREG) 3.125 MG tablet Take 1 tablet (3.125 mg total) by mouth 2 (two) times daily with a meal. 12/31/17   Epifanio Lesches, MD  dexamethasone (DECADRON) 2 MG tablet Take 8.5 mg by mouth See admin instructions. Take 8.5 mg on Thursday and Friday.    [provider]  finasteride (PROSCAR) 5 MG tablet Take 2.5 mg by mouth daily.     [provider]  isosorbide mononitrate (IMDUR) 30 MG 24 hr tablet Take 0.5 tablets (15 mg total) by mouth daily. 04/08/18   Salary, Avel Peace, MD  lansoprazole (PREVACID) 30 MG capsule Take 30 mg by mouth 2 (two) times daily as needed.  11/03/17 11/03/18  [provider]  lisinopril (ZESTRIL) 2.5 MG tablet Take 1 tablet (2.5 mg total) by mouth daily. 09/20/17 09/20/18  Dustin Flock, MD  Multiple Vitamin (MULTI-VITAMINS) TABS Take 1 tablet by mouth daily.    [provider]  ticagrelor (BRILINTA) 90 MG TABS tablet Take 1 tablet (90 mg total) by mouth 2 (two) times daily. 04/08/18   Salary, Avel Peace, MD    Inpatient Medications:  . aspirin EC  81 mg Oral Daily  . atorvastatin  80 mg Oral Daily  . carvedilol  3.125 mg Oral BID WC  . docusate sodium  100 mg Oral BID  . enoxaparin (LOVENOX) injection  40 mg Subcutaneous  Q24H  . finasteride  2.5 mg Oral Daily  . insulin aspart  0-5 Units Subcutaneous Once  . insulin aspart  0-9 Units Subcutaneous Q4H  . lisinopril  2.5 mg Oral Daily  . ticagrelor  90 mg Oral BID     Allergies:  Allergies  Allergen Reactions  . Antihistamines, Chlorpheniramine-Type Other (See Comments)    Reaction: unknown  . Nitroglycerin Other (See Comments)    Reaction: hypotension    Social History   Socioeconomic History  . Marital status: Married    Spouse name: Not on file  . Number of children: Not on file  . Years of education: Not on file  . Highest education level: Not on file  Occupational History  . Occupation: retired  Scientific laboratory technician  . Financial resource strain: Not on file  . Food insecurity:    Worry: Not on file    Inability: Not on file  . Transportation needs:    Medical: Not on file    Non-medical: Not on file  Tobacco Use  . Smoking status: Former Smoker    Last attempt to quit: 2015    Years since quitting:  4.8  . Smokeless tobacco: Former Network engineer and Sexual Activity  . Alcohol use: No  . Drug use: No  . Sexual activity: Not on file  Lifestyle  . Physical activity:    Days per week: Not on file    Minutes per session: Not on file  . Stress: Not on file  Relationships  . Social connections:    Talks on phone: Not on file    Gets together: Not on file    Attends religious service: Not on file    Active member of club or organization: Not on file    Attends meetings of clubs or organizations: Not on file    Relationship status: Not on file  . Intimate partner violence:    Fear of current or ex partner: Not on file    Emotionally abused: Not on file    Physically abused: Not on file    Forced sexual activity: Not on file  Other Topics Concern  . Not on file  Social History Narrative  . Not on file     Family History  Problem Relation Age of Onset  . Heart disease Other   . Heart disease Father      Review of  Systems Positive for chest pain pressure Negative for: General:  chills, fever, night sweats or weight changes.  Cardiovascular: PND orthopnea syncope dizziness  Dermatological skin lesions rashes Respiratory: Cough congestion Urologic: Frequent urination urination at night and hematuria Abdominal: negative for nausea, vomiting, diarrhea, bright red blood per rectum, melena, or hematemesis Neurologic: negative for visual changes, and/or hearing changes  All other systems reviewed and are otherwise negative except as noted above.  Labs: Recent Labs    09/15/18 2221 09/16/18 0732  TROPONINI 0.05* 0.26*   Lab Results  Component Value Date   WBC 13.1 (H) 09/15/2018   HGB 9.4 (L) 09/15/2018   HCT 29.0 (L) 09/15/2018   MCV 87.1 09/15/2018   PLT 118 (L) 09/15/2018    Recent Labs  Lab 09/15/18 2221  NA 137  K 4.3  CL 108  CO2 21*  BUN 28*  CREATININE 1.35*  CALCIUM 8.7*  GLUCOSE 316*   Lab Results  Component Value Date   CHOL 79 01/14/2018   HDL 38 (L) 01/14/2018   LDLCALC 27 01/14/2018   TRIG 72 01/14/2018   No results found for: DDIMER  Radiology/Studies:  Dg Chest 2 View  Result Date: 09/15/2018 CLINICAL DATA:  Nausea, vomiting and chest pain EXAM: CHEST - 2 VIEW COMPARISON:  08/19/2018 FINDINGS: Remote median sternotomy for CABG. Diffuse interstitial coarsening. No pulmonary edema or focal airspace consolidation. No pleural effusion or pneumothorax. IMPRESSION: No active cardiopulmonary disease. Electronically Signed   By: Ulyses Jarred M.D.   On: 09/15/2018 22:48   Dg Chest Port 1 View  Result Date: 08/19/2018 CLINICAL DATA:  Chest pain. History of myocardial infarction and bypass. History of prostate cancer. EXAM: PORTABLE CHEST 1 VIEW COMPARISON:  Radiographs 04/07/2018 and 01/14/2018.  CT 12/29/2017. FINDINGS: 1329 hours. The heart size and mediastinal contours are stable status post median sternotomy and CABG. Loop recorder overlies the left ventricle. There  is stable mild chronic interstitial prominence throughout the lungs. No edema, confluent airspace opacity, pleural effusion or pneumothorax. No acute osseous findings are seen. IMPRESSION: Stable postoperative chest.  No acute cardiopulmonary process. Electronically Signed   By: Richardean Sale M.D.   On: 08/19/2018 13:49    EKG: Normal sinus rhythm with left bundle branch block  Weights: Filed Weights   09/15/18 2217 09/16/18 0437  Weight: 69.4 kg 65.4 kg     Physical Exam: Blood pressure (!) 156/56, pulse 61, temperature 97.7 F (36.5 C), temperature source Oral, resp. rate 18, height 5\' 7"  (1.702 m), weight 65.4 kg, SpO2 99 %. Body mass index is 22.58 kg/m. General: Well developed, well nourished, in no acute distress. Head eyes ears nose throat: Normocephalic, atraumatic, sclera non-icteric, no xanthomas, nares are without discharge. No apparent thyromegaly and/or mass  Lungs: Normal respiratory effort.  no wheezes, no rales, no rhonchi.  Heart: RRR with normal S1 S2.  2+ murmur gallop, no rub, PMI is normal size and placement, carotid upstroke normal without bruit, jugular venous pressure is normal Abdomen: Soft, non-tender, non-distended with normoactive bowel sounds. No hepatomegaly. No rebound/guarding. No obvious abdominal masses. Abdominal aorta is normal size without bruit Extremities: No edema. no cyanosis, no clubbing, no ulcers  Peripheral : 2+ bilateral upper extremity pulses, 2+ bilateral femoral pulses, 2+ bilateral dorsal pedal pulse Neuro: Alert and oriented. No facial asymmetry. No focal deficit. Moves all extremities spontaneously. Musculoskeletal: Normal muscle tone without kyphosis Psych:  Responds to questions appropriately with a normal affect.    Assessment: 84 year old male with known coronary disease status post coronary bypass graft previous stenting myocardial infarction essential hypertension mixed hyperlipidemia chronic kidney disease stage III with  abnormal EKG and progressive symptoms consistent with non-ST elevation myocardial infarction  Plan: 1.  Continue serial ECG and enzymes to assess extent of myocardial infarction 2.  Continue heparin aspirin and Brilinta for further risk reduction of myocardial infarction 3.  High intensity lisinopril and beta-blocker for cardiovascular disease and hypertension control 4.  Proceed to cardiac catheterization to assess coronary artery disease bypass graft disease and further treatment thereof is necessary.  Patient understands risk and benefits of cardiac catheterization.  This includes the possibility of death stroke heart attack infection bleeding blood clot.  He is at low risk for conscious sedation  Signed, Corey Skains M.D. [facility] 09/16/2018, 8:29 AM

## 2018-09-16 NOTE — Progress Notes (Signed)
Significantly elevated troponin and chest pain.  Will start patient on heparin drip  Will need cardiac catheterization on Monday. Tele.

## 2018-09-16 NOTE — ED Notes (Signed)
Several attempts made to fax packet to [place] which failed multiple times/ Email also tried / Per Rise Paganini at [employer] are down at this time/ Md aware

## 2018-09-16 NOTE — Progress Notes (Signed)
[facility] for heparin Indication: chest pain/ACS  Allergies  Allergen Reactions  . Antihistamines, Chlorpheniramine-Type Other (See Comments)    Reaction: unknown  . Nitroglycerin Other (See Comments)    Reaction: hypotension    Patient Measurements: Height: 5\' 7"  (170.2 cm) Weight: 144 lb 3.2 oz (65.4 kg) IBW/kg (Calculated) : 66.1 Heparin Dosing Weight: 65.4 kg  Vital Signs: Temp: 97.7 F (36.5 C) (10/19 0734) Temp Source: Oral (10/19 0734) BP: 156/56 (10/19 0734) Pulse Rate: 61 (10/19 0734)  Labs: Recent Labs    09/15/18 2221 09/16/18 0732 09/16/18 1057 09/16/18 1437  HGB 9.4*  --   --  9.1*  HCT 29.0*  --   --  28.1*  PLT 118*  --   --  108*  APTT  --   --   --  24  LABPROT  --   --   --  14.1  INR  --   --   --  1.10  CREATININE 1.35*  --   --   --   TROPONINI 0.05* 0.26* 0.24*  --     Estimated Creatinine Clearance: 36.3 mL/min (A) (by C-G formula based on SCr of 1.35 mg/dL (H)).   Medical History: Past Medical History:  Diagnosis Date  . Cancer ([place])    bone marrow, prostate cancer  . Diabetes mellitus without complication ([place])   . Hyperlipidemia   . MI (mitral incompetence)   . MI (myocardial infarction) [facility])     Assessment: 84 year old male who presented with chest pain. Troponins elevated on admission. Patient taking ticagrelor and aspirin PTA, no anticoagulation. Pharmacy consulted for heparin dosing.  Goal of Therapy:  Heparin level 0.3-0.7 units/ml Monitor platelets by anticoagulation protocol: Yes   Plan:  Will give heparin 3000 unit bolus Start heparin drip at 750 units/hr Heparin level ordered for 2300 CBC with morning labs  Tawnya Crook, PharmD Pharmacy Resident  09/16/2018 4:04 PM

## 2018-09-16 NOTE — ED Notes (Signed)
Several attempts made to fax packet to [place] which failed multiple times/ Email also tried / Per Rise Paganini at [employer] are down at this time/ Md aware  IVC rescinded/ Pt admitted medically

## 2018-09-16 NOTE — Care Management Obs Status (Signed)
[facility] NOTIFICATION   Patient Details  Name: Dwayne Terry MRN: [id number] Date of Birth: 01-15-1930   Medicare Observation Status Notification Given:  Yes    [name] [name], RN 09/16/2018, 11:21 AM

## 2018-09-16 NOTE — Progress Notes (Signed)
Advance care planning  Purpose of Encounter NSTEMI, Code status discussion  Parties in Attendance Patient and wife  Patients Decisional capacity Alert and awake. Able to make medical desicions  Discussed regarding patient's risk factors and non-ST elevation MI.  Prognosis and treatment plan.  All questions answered.  Discussed regarding CODE STATUS and patient tells me that he does not want resuscitation or intubation  Do Not resuscitate and DO NOT INTUBATE  Time spent - 17 minutes

## 2018-09-16 NOTE — ED Notes (Signed)
Several attempts made to fax packet to [place] which failed multiple times/ Email also tried / Per Rise Paganini at [employer] are down at this time/ Md aware/   Previous note contain an error correction made

## 2018-09-16 NOTE — ED Provider Notes (Signed)
[facility] Will consult hospitalist for admission per Dr. Lucilla Lame plan.   Rudene Re, MD 09/16/18 [phone]

## 2018-09-16 NOTE — H&P (Signed)
Dwayne Terry is an 84 y.o. male.   Chief Complaint: Chest pain HPI: The patient with past medical history of metastatic prostate cancer, diabetes, hyperlipidemia and coronary artery disease status post CABG and multiple angioplasty and stent placement presents the emergency department complaining of chest pain.  The patient's pain occurred while he was walking around his room.  He has not been feeling well all day and has had some cough as well as one episode of nonbloody nonbilious emesis.  He denies fever.  His chest pain was associated with mild shortness of breath at onset has now improved.  First troponin was mildly elevated.  At the time of this dictation his second troponin has increased fourfold to 0.26 ng/mL.  Due to his cardiac risk factors as well as increase in troponin's emergency department staff, hospitalist service for admission.  Past Medical History:  Diagnosis Date  . Cancer ([place])    bone marrow, prostate cancer  . Diabetes mellitus without complication ([place])   . Hyperlipidemia   . MI (mitral incompetence)   . MI (myocardial infarction) [facility])     Past Surgical History:  Procedure Laterality Date  . AORTIC VALVE REPLACEMENT (AVR)/CORONARY ARTERY BYPASS GRAFTING (CABG)    . CORONARY ANGIOPLASTY WITH STENT PLACEMENT    . CORONARY BALLOON ANGIOPLASTY N/A 06/10/2017   Procedure: Coronary Balloon Angioplasty;  Surgeon: Wellington Hampshire, MD;  Location: [place] [facility];  Service: Cardiovascular;  Laterality: N/A;  . CORONARY STENT INTERVENTION N/A 01/28/2017   Procedure: Coronary Stent Intervention;  Surgeon: [name], MD;  Location: [place] [facility];  Service: Cardiovascular;  Laterality: N/A;  . CORONARY STENT INTERVENTION N/A 04/29/2017   Procedure: Coronary Stent Intervention;  Surgeon: [name], MD;  Location: [place] [facility];  Service: Cardiovascular;  Laterality: N/A;  . CORONARY STENT INTERVENTION N/A 09/19/2017   Procedure:  CORONARY/GRAFT ANGIOGRAPHY;  Surgeon: Corey Skains, MD;  Location: [place] [facility];  Service: Cardiovascular;  Laterality: N/A;  . CORONARY STENT INTERVENTION N/A 09/19/2017   Procedure: CORONARY STENT INTERVENTION;  Surgeon: Wellington Hampshire, MD;  Location: [place] [facility];  Service: Cardiovascular;  Laterality: N/A;  . LEFT HEART CATH AND CORONARY ANGIOGRAPHY N/A 01/28/2017   Procedure: Left Heart Cath and Coronary Angiography;  Surgeon: [name], MD;  Location: [place] [facility];  Service: Cardiovascular;  Laterality: N/A;  . LEFT HEART CATH AND CORONARY ANGIOGRAPHY N/A 10/31/2017   Procedure: LEFT HEART CATH AND CORONARY ANGIOGRAPHY;  Surgeon: Teodoro Spray, MD;  Location: [place] [facility];  Service: Cardiovascular;  Laterality: N/A;  . LEFT HEART CATH AND CORS/GRAFTS ANGIOGRAPHY N/A 04/29/2017   Procedure: Left Heart Cath and Cors/Grafts Angiography;  Surgeon: [name], MD;  Location: [place] [facility];  Service: Cardiovascular;  Laterality: N/A;  . LEFT HEART CATH AND CORS/GRAFTS ANGIOGRAPHY N/A 06/10/2017   Procedure: Left Heart Cath and Cors/Grafts Angiography;  Surgeon: Corey Skains, MD;  Location: [place] [facility];  Service: Cardiovascular;  Laterality: N/A;  . LEFT HEART CATH AND CORS/GRAFTS ANGIOGRAPHY N/A 09/19/2017   Procedure: LEFT HEART CATH;  Surgeon: Corey Skains, MD;  Location: [place] [facility];  Service: Cardiovascular;  Laterality: N/A;    Family History  Problem Relation Age of Onset  . Heart disease Other   . Heart disease Father    Social History:  reports that he quit smoking about 4 years ago. He has quit using smokeless tobacco. He reports that he does  not drink alcohol or use drugs.  Allergies:  Allergies  Allergen Reactions  . Antihistamines, Chlorpheniramine-Type Other (See Comments)    Reaction: unknown  . Nitroglycerin Other (See Comments)    Reaction: hypotension    Medications Prior to  Admission  Medication Sig Dispense Refill  . acyclovir (ZOVIRAX) 200 MG capsule Take 200 mg by mouth 2 (two) times daily.    Marland Kitchen ALPRAZolam (XANAX) 0.25 MG tablet Take 1 tablet (0.25 mg total) by mouth 2 (two) times daily as needed for anxiety. 30 tablet 0  . aspirin EC 81 MG tablet Take 1 tablet (81 mg total) by mouth daily.    Marland Kitchen atorvastatin (LIPITOR) 80 MG tablet Take 1 tablet by mouth daily.    . carvedilol (COREG) 3.125 MG tablet Take 1 tablet (3.125 mg total) by mouth 2 (two) times daily with a meal. 30 tablet 0  . dexamethasone (DECADRON) 2 MG tablet Take 8.5 mg by mouth See admin instructions. Take 8.5 mg on Thursday and Friday.    . finasteride (PROSCAR) 5 MG tablet Take 2.5 mg by mouth daily.     . isosorbide mononitrate (IMDUR) 30 MG 24 hr tablet Take 0.5 tablets (15 mg total) by mouth daily. 30 tablet 0  . lansoprazole (PREVACID) 30 MG capsule Take 30 mg by mouth 2 (two) times daily as needed.     Marland Kitchen lisinopril (ZESTRIL) 2.5 MG tablet Take 1 tablet (2.5 mg total) by mouth daily. 30 tablet 11  . Multiple Vitamin (MULTI-VITAMINS) TABS Take 1 tablet by mouth daily.    . ticagrelor (BRILINTA) 90 MG TABS tablet Take 1 tablet (90 mg total) by mouth 2 (two) times daily. 60 tablet 0    Results for orders placed or performed during the hospital encounter of 09/15/18 (from the past 48 hour(s))  Basic metabolic panel     Status: Abnormal   Collection Time: 09/15/18 10:21 PM  Result Value Ref Range   Sodium 137 135 - 145 mmol/L   Potassium 4.3 3.5 - 5.1 mmol/L   Chloride 108 98 - 111 mmol/L   CO2 21 (L) 22 - 32 mmol/L   Glucose, Bld 316 (H) 70 - 99 mg/dL   BUN 28 (H) 8 - 23 mg/dL   Creatinine, Ser 1.35 (H) 0.61 - 1.24 mg/dL   Calcium 8.7 (L) 8.9 - 10.3 mg/dL   GFR calc non Af Amer 46 (L) >60 mL/min   GFR calc Af Amer 53 (L) >60 mL/min    Comment: (NOTE) The eGFR has been calculated using the CKD EPI equation. This calculation has not been validated in all clinical situations. eGFR's  persistently <60 mL/min signify possible Chronic Kidney Disease.    Anion gap 8 5 - 15    Comment: Performed at [facility], [address] [place], [place] [address]  CBC     Status: Abnormal   Collection Time: 09/15/18 10:21 PM  Result Value Ref Range   WBC 13.1 (H) 4.0 - 10.5 K/uL   RBC 3.33 (L) 4.22 - 5.81 MIL/uL   Hemoglobin 9.4 (L) 13.0 - 17.0 g/dL   HCT 29.0 (L) 39.0 - 52.0 %   MCV 87.1 80.0 - 100.0 fL   MCH 28.2 26.0 - 34.0 pg   MCHC 32.4 30.0 - 36.0 g/dL   RDW 15.9 (H) 11.5 - 15.5 %   Platelets 118 (L) 150 - 400 K/uL    Comment: Immature Platelet Fraction may be clinically indicated, consider ordering this additional test [id number]  nRBC 0.0 0.0 - 0.2 %    Comment: Performed at [facility], [address] [place], [place] [address]  Troponin I     Status: Abnormal   Collection Time: 09/15/18 10:21 PM  Result Value Ref Range   Troponin I 0.05 (HH) <0.03 ng/mL    Comment: CRITICAL RESULT CALLED TO, READ BACK BY AND VERIFIED WITH ANN CALES AT 2316 09/15/2018.  TFK Performed at [facility], [address] [place], [place] [address]   TSH     Status: Abnormal   Collection Time: 09/16/18  7:32 AM  Result Value Ref Range   TSH 0.272 (L) 0.350 - 4.500 uIU/mL    Comment: Performed by a 3rd Generation assay with a functional sensitivity of <=0.01 uIU/mL. Performed at [facility], [address] [place], [place] [address]   Troponin I     Status: Abnormal   Collection Time: 09/16/18  7:32 AM  Result Value Ref Range   Troponin I 0.26 (HH) <0.03 ng/mL    Comment: CRITICAL VALUE NOTED. VALUE IS CONSISTENT WITH PREVIOUSLY REPORTED/CALLED VALUE SNJ Performed at [facility], [place], [address] [place], [place] [address]   Glucose, capillary     Status: Abnormal   Collection Time: 09/16/18  7:33 AM  Result Value Ref Range   Glucose-Capillary 139 (H) 70 - 99 mg/dL   Dg Chest 2 View  Result Date: 09/15/2018 CLINICAL  DATA:  Nausea, vomiting and chest pain EXAM: CHEST - 2 VIEW COMPARISON:  08/19/2018 FINDINGS: Remote median sternotomy for CABG. Diffuse interstitial coarsening. No pulmonary edema or focal airspace consolidation. No pleural effusion or pneumothorax. IMPRESSION: No active cardiopulmonary disease. Electronically Signed   By: Ulyses Jarred M.D.   On: 09/15/2018 22:48    Review of Systems  Constitutional: Positive for malaise/fatigue. Negative for chills and fever.  HENT: Negative for sore throat and tinnitus.   Eyes: Negative for blurred vision and redness.  Respiratory: Positive for cough. Negative for shortness of breath.   Cardiovascular: Negative for chest pain, palpitations, orthopnea and PND.  Gastrointestinal: Positive for vomiting. Negative for abdominal pain, diarrhea and nausea.  Genitourinary: Negative for dysuria, frequency and urgency.  Musculoskeletal: Negative for joint pain and myalgias.  Skin: Negative for rash.       No lesions  Neurological: Negative for speech change, focal weakness and weakness.  Endo/Heme/Allergies: Does not bruise/bleed easily.       No temperature intolerance  Psychiatric/Behavioral: Negative for depression and suicidal ideas.    Blood pressure (!) 156/56, pulse 61, temperature 97.7 F (36.5 C), temperature source Oral, resp. rate 18, height '5\' 7"'  (1.702 m), weight 65.4 kg, SpO2 99 %. Physical Exam  Vitals reviewed. Constitutional: He is oriented to person, place, and time. He appears well-developed and well-nourished.  HENT:  Head: Normocephalic and atraumatic.  Mouth/Throat: Oropharynx is clear and moist.  Eyes: Pupils are equal, round, and reactive to light. Conjunctivae and EOM are normal. No scleral icterus.  Neck: Normal range of motion. Neck supple. No JVD present. No tracheal deviation present. No thyromegaly present.  Cardiovascular: Normal rate, regular rhythm and normal heart sounds. Exam reveals no gallop and no friction rub.  No  murmur heard. Respiratory: Effort normal and breath sounds normal. No respiratory distress.  GI: Soft. Bowel sounds are normal. He exhibits no distension. There is no tenderness.  Genitourinary:  Genitourinary Comments: Deferred  Musculoskeletal: Normal range of motion. He exhibits no edema.  Lymphadenopathy:    He has  no cervical adenopathy.  Neurological: He is alert and oriented to person, place, and time. No cranial nerve deficit.  Skin: Skin is warm and dry. No rash noted. He is not diaphoretic. No erythema.  Psychiatric: He has a normal mood and affect. His behavior is normal. Judgment and thought content normal.     Assessment/Plan This is an 84 year old male admitted for chest pain. 1.  Chest pain: The patient is pain-free at this time however his troponin is increasing.  Await cardiology recommendations.  Continue to follow cardiac biomarkers.  Monitor telemetry. 2.  Coronary artery disease: Continue aspirin and Brilinta. 3.  Hypertension: Acceptable for age but elevated for ACS risk.  Continue lisinopril and carvedilol.  Labetalol as needed 4.  Prostate cancer: With bone metastases.  Treatment per oncology regimen. Continue finasteride 5.  Diabetes mellitus type 2: Hold oral hypoglycemic agents.  Sliding scale insulin while hospitalized 6.  Hyperlipidemia: Continue statin therapy 7.  DVT prophylaxis: Lovenox 8.  GI prophylaxis: None The patient is a full code.  Time spent on admission orders and patient care approximately 45 minutes  Harrie Foreman, MD 09/16/2018, 8:54 AM

## 2018-09-17 LAB — CBC
HEMATOCRIT: 26.1 % — AB (ref 39.0–52.0)
HEMOGLOBIN: 8.3 g/dL — AB (ref 13.0–17.0)
MCH: 27.7 pg (ref 26.0–34.0)
MCHC: 31.8 g/dL (ref 30.0–36.0)
MCV: 87 fL (ref 80.0–100.0)
NRBC: 0 % (ref 0.0–0.2)
Platelets: 87 10*3/uL — ABNORMAL LOW (ref 150–400)
RBC: 3 MIL/uL — AB (ref 4.22–5.81)
RDW: 16.1 % — AB (ref 11.5–15.5)
WBC: 9.7 10*3/uL (ref 4.0–10.5)

## 2018-09-17 LAB — GLUCOSE, CAPILLARY
Glucose-Capillary: 109 mg/dL — ABNORMAL HIGH (ref 70–99)
Glucose-Capillary: 124 mg/dL — ABNORMAL HIGH (ref 70–99)
Glucose-Capillary: 114 mg/dL — ABNORMAL HIGH (ref 70–99)
Glucose-Capillary: 140 mg/dL — ABNORMAL HIGH (ref 70–99)

## 2018-09-17 LAB — HEPARIN LEVEL (UNFRACTIONATED): Heparin Unfractionated: 0.37 IU/mL (ref 0.30–0.70)

## 2018-09-17 MED ORDER — SODIUM CHLORIDE 0.9 % WEIGHT BASED INFUSION
3.0000 mL/kg/h | INTRAVENOUS | Status: AC
  Administered 2018-09-18: 3 mL/kg/h via INTRAVENOUS

## 2018-09-17 MED ORDER — ASPIRIN 81 MG PO CHEW
81.0000 mg | CHEWABLE_TABLET | ORAL | Status: AC
  Administered 2018-09-18: 81 mg via ORAL
  Filled 2018-09-17: qty 1

## 2018-09-17 MED ORDER — SODIUM CHLORIDE 0.9 % WEIGHT BASED INFUSION: 1.0000 mL/kg/h | INTRAVENOUS | Status: DC

## 2018-09-17 MED ORDER — SODIUM CHLORIDE 0.9% FLUSH
3.0000 mL | Freq: Two times a day (BID) | INTRAVENOUS | Status: DC
  Administered 2018-09-17 – 2018-09-18 (×3): 3 mL via INTRAVENOUS

## 2018-09-17 MED ORDER — SODIUM CHLORIDE 0.9 % IV SOLN: 250.0000 mL | INTRAVENOUS | Status: DC | PRN

## 2018-09-17 MED ORDER — SODIUM CHLORIDE 0.9% FLUSH: 3.0000 mL | INTRAVENOUS | Status: DC | PRN

## 2018-09-17 NOTE — Plan of Care (Signed)

## 2018-09-17 NOTE — Progress Notes (Signed)
[facility] [place] NAME: Dwayne Terry    MR#:  [id number]  DATE OF BIRTH:  11-Jul-1930  SUBJECTIVE:  CHIEF COMPLAINT:   Chief Complaint  Patient presents with  . Nausea  . Emesis  . Chest Pain   No further chest pain today.  On heparin drip.  Waiting for cardiac catheterization.  REVIEW OF SYSTEMS:    Review of Systems  Constitutional: Positive for malaise/fatigue. Negative for chills and fever.  HENT: Negative for sore throat.   Eyes: Negative for blurred vision, double vision and pain.  Respiratory: Negative for cough, hemoptysis, shortness of breath and wheezing.   Cardiovascular: Positive for chest pain. Negative for palpitations, orthopnea and leg swelling.  Gastrointestinal: Negative for abdominal pain, constipation, diarrhea, heartburn, nausea and vomiting.  Genitourinary: Negative for dysuria and hematuria.  Musculoskeletal: Negative for back pain and joint pain.  Skin: Negative for rash.  Neurological: Negative for sensory change, speech change, focal weakness and headaches.  Endo/Heme/Allergies: Does not bruise/bleed easily.  Psychiatric/Behavioral: Negative for depression. The patient is not nervous/anxious.     DRUG ALLERGIES:   Allergies  Allergen Reactions  . Antihistamines, Chlorpheniramine-Type Other (See Comments)    Reaction: unknown  . Nitroglycerin Other (See Comments)    Reaction: hypotension    VITALS:  Blood pressure (!) 121/47, pulse 63, temperature (!) 97.4 F (36.3 C), temperature source Oral, resp. rate 20, height 5\' 7"  (1.702 m), weight 67.7 kg, SpO2 98 %.  PHYSICAL EXAMINATION:   Physical Exam  GENERAL:  84 y.o.-year-old patient lying in the bed with no acute distress.  EYES: Pupils equal, round, reactive to light and accommodation. No scleral icterus. Extraocular muscles intact.  HEENT: Head atraumatic, normocephalic. Oropharynx and nasopharynx clear.  NECK:  Supple, no jugular venous  distention. No thyroid enlargement, no tenderness.  LUNGS: Normal breath sounds bilaterally, no wheezing, rales, rhonchi. No use of accessory muscles of respiration.  CARDIOVASCULAR: S1, S2 normal. No murmurs, rubs, or gallops.  ABDOMEN: Soft, nontender, nondistended. Bowel sounds present. No organomegaly or mass.  EXTREMITIES: No cyanosis, clubbing or edema b/l.    NEUROLOGIC: Cranial nerves II through XII are intact. No focal Motor or sensory deficits b/l.   PSYCHIATRIC: The patient is alert and oriented x 3.  SKIN: No obvious rash, lesion, or ulcer.   LABORATORY PANEL:   CBC Recent Labs  Lab 09/17/18 0559  WBC 9.7  HGB 8.3*  HCT 26.1*  PLT 87*   ------------------------------------------------------------------------------------------------------------------ Chemistries  Recent Labs  Lab 09/15/18 2221  NA 137  K 4.3  CL 108  CO2 21*  GLUCOSE 316*  BUN 28*  CREATININE 1.35*  CALCIUM 8.7*   ------------------------------------------------------------------------------------------------------------------  Cardiac Enzymes Recent Labs  Lab 09/16/18 1652  TROPONINI 0.22*   ------------------------------------------------------------------------------------------------------------------  RADIOLOGY:  Dg Chest 2 View  Result Date: 09/15/2018 CLINICAL DATA:  Nausea, vomiting and chest pain EXAM: CHEST - 2 VIEW COMPARISON:  08/19/2018 FINDINGS: Remote median sternotomy for CABG. Diffuse interstitial coarsening. No pulmonary edema or focal airspace consolidation. No pleural effusion or pneumothorax. IMPRESSION: No active cardiopulmonary disease. Electronically Signed   By: Ulyses Jarred M.D.   On: 09/15/2018 22:48     ASSESSMENT AND PLAN:   * NSTEMI ON ASA, brillinta. Heparin drip Statin  * HTN - Well controlled Continue home meds  *Prostate cancer with mets.  Outpatient follow-up with oncology  * diabetes mellitus type 2.  Sliding scale insulin  All the  records are reviewed and  case discussed with Care Management/Social Worker Management plans discussed with the patient, family and they are in agreement.  CODE STATUS: DNR  DVT Prophylaxis: SCDs  TOTAL TIME TAKING CARE OF THIS PATIENT: 30 minutes.   POSSIBLE D/C IN 1-2 DAYS, DEPENDING ON CLINICAL CONDITION.  Leia Alf Keefe Zawistowski M.D on 09/17/2018 at 12:16 PM  Between 7am to 6pm - Pager - [phone]  After 6pm go to [email or web address] - password [email or web address] [facility]  Office  [phone]  CC: Primary care physician; Kirk Ruths, MD  Note: This dictation was prepared with Dragon dictation along with smaller phrase technology. Any transcriptional errors that result from this process are unintentional.

## 2018-09-17 NOTE — Progress Notes (Addendum)
Dwayne Terry for heparin Indication: chest pain/ACS  Allergies  Allergen Reactions  . Antihistamines, Chlorpheniramine-Type Other (See Comments)    Reaction: unknown  . Nitroglycerin Other (See Comments)    Reaction: hypotension    Patient Measurements: Height: 5\' 7"  (170.2 cm) Weight: 144 lb 3.2 oz (65.4 kg) IBW/kg (Calculated) : 66.1 Heparin Dosing Weight: 65.4 kg  Vital Signs: Temp: 97.5 F (36.4 C) (10/19 1947) Temp Source: Oral (10/19 1947) BP: 133/53 (10/19 1947) Pulse Rate: 65 (10/19 1947)  Labs: Recent Labs    09/15/18 2221 09/16/18 0732 09/16/18 1057 09/16/18 1437 09/16/18 1652 09/16/18 2257  HGB 9.4*  --   --  9.1*  --   --   HCT 29.0*  --   --  28.1*  --   --   PLT 118*  --   --  108*  --   --   APTT  --   --   --  24  --   --   LABPROT  --   --   --  14.1  --   --   INR  --   --   --  1.10  --   --   HEPARINUNFRC  --   --   --   --   --  0.38  CREATININE 1.35*  --   --   --   --   --   TROPONINI 0.05* 0.26* 0.24*  --  0.22*  --     Estimated Creatinine Clearance: 36.3 mL/min (A) (by C-G formula based on SCr of 1.35 mg/dL (H)).   Medical History: Past Medical History:  Diagnosis Date  . Cancer ([place])    bone marrow, prostate cancer  . Diabetes mellitus without complication ([place])   . Hyperlipidemia   . MI (mitral incompetence)   . MI (myocardial infarction) [facility])     Assessment: 84 year old male who presented with chest pain. Troponins elevated on admission. Patient taking ticagrelor and aspirin PTA, no anticoagulation. Pharmacy consulted for heparin dosing.  Goal of Therapy:  Heparin level 0.3-0.7 units/ml Monitor platelets by anticoagulation protocol: Yes   Plan:  Will give heparin 3000 unit bolus Start heparin drip at 750 units/hr Heparin level ordered for 2300 CBC with morning labs  10/19 PM heparin level 0.38. Continue current regimen. Recheck heparin level and CBC with tomorrow AM labs.  10/20  AM heparin level 0.37. Continue current regimen. Recheck heparin level and CBC with tomorrow AM labs.  Kairah Leoni S, PharmD 09/17/2018 1:00 AM

## 2018-09-17 NOTE — Progress Notes (Signed)
[facility] Encounter Note  Patient: Dwayne Terry / Admit Date: 09/15/2018 / Date of Encounter: 09/17/2018, 7:01 AM   Subjective: Has full resolution of chest discomfort and pressure.  Troponin peaked at 0.24 possibly consistent with non-ST elevation myocardial infarction.  Patient does have anemia and chronic kidney disease which may have some bearing on his chest pain and pressure as well.  The patient has been on appropriate medication management for previous coronary artery disease cardiovascular disease including Brilinta aspirin for recent PCI and stent placement  Review of Systems: Positive for: Shortness of breath Negative for: Vision change, hearing change, syncope, dizziness, nausea, vomiting,diarrhea, bloody stool, stomach pain, cough, congestion, diaphoresis, urinary frequency, urinary pain,skin lesions, skin rashes Others previously listed  Objective: Telemetry: Normal sinus rhythm Physical Exam: Blood pressure (!) 108/50, pulse 63, temperature 98.5 F (36.9 C), temperature source Oral, resp. rate 19, height 5\' 7"  (1.702 m), weight 67.7 kg, SpO2 96 %. Body mass index is 23.38 kg/m. General: Well developed, well nourished, in no acute distress. Head: Normocephalic, atraumatic, sclera non-icteric, no xanthomas, nares are without discharge. Neck: No apparent masses Lungs: Normal respirations with no wheezes, no rhonchi, no rales , no crackles   Heart: Regular rate and rhythm, normal S1 S2, no murmur, no rub, no gallop, PMI is normal size and placement, carotid upstroke normal without bruit, jugular venous pressure normal Abdomen: Soft, non-tender, non-distended with normoactive bowel sounds. No hepatosplenomegaly. Abdominal aorta is normal size without bruit Extremities: No edema, no clubbing, no cyanosis, no ulcers,  Peripheral: 2+ radial, 2+ femoral, 2+ dorsal pedal pulses Neuro: Alert and oriented. Moves all extremities spontaneously. Psych:   Responds to questions appropriately with a normal affect.   Intake/Output Summary (Last 24 hours) at 09/17/2018 0701 Last data filed at 09/17/2018 0411 Gross per 24 hour  Intake 219.66 ml  Output 600 ml  Net -380.34 ml    Inpatient Medications:  . acyclovir  200 mg Oral BID  . aspirin EC  81 mg Oral Daily  . atorvastatin  80 mg Oral Daily  . carvedilol  3.125 mg Oral BID WC  . docusate sodium  100 mg Oral BID  . finasteride  2.5 mg Oral Daily  . insulin aspart  0-5 Units Subcutaneous Once  . insulin aspart  0-9 Units Subcutaneous TID AC & HS  . lisinopril  2.5 mg Oral Daily  . sodium chloride flush  3 mL Intravenous Q12H  . ticagrelor  90 mg Oral BID   Infusions:  . heparin 750 Units/hr (09/17/18 0411)    Labs: Recent Labs    09/15/18 2221  NA 137  K 4.3  CL 108  CO2 21*  GLUCOSE 316*  BUN 28*  CREATININE 1.35*  CALCIUM 8.7*   No results for input(s): AST, ALT, ALKPHOS, BILITOT, PROT, ALBUMIN in the last 72 hours. Recent Labs    09/16/18 1437 09/17/18 0559  WBC 11.9* 9.7  HGB 9.1* 8.3*  HCT 28.1* 26.1*  MCV 87.5 87.0  PLT 108* 87*   Recent Labs    09/15/18 2221 09/16/18 0732 09/16/18 1057 09/16/18 1652  TROPONINI 0.05* 0.26* 0.24* 0.22*   Invalid input(s): POCBNP No results for input(s): HGBA1C in the last 72 hours.   Weights: Filed Weights   09/15/18 2217 09/16/18 0437 09/17/18 0617  Weight: 69.4 kg 65.4 kg 67.7 kg     Radiology/Studies:  Dg Chest 2 View  Result Date: 09/15/2018 CLINICAL DATA:  Nausea, vomiting and chest pain EXAM: CHEST -  2 VIEW COMPARISON:  08/19/2018 FINDINGS: Remote median sternotomy for CABG. Diffuse interstitial coarsening. No pulmonary edema or focal airspace consolidation. No pleural effusion or pneumothorax. IMPRESSION: No active cardiopulmonary disease. Electronically Signed   By: Ulyses Jarred M.D.   On: 09/15/2018 22:48   Dg Chest Port 1 View  Result Date: 08/19/2018 CLINICAL DATA:  Chest pain. History of  myocardial infarction and bypass. History of prostate cancer. EXAM: PORTABLE CHEST 1 VIEW COMPARISON:  Radiographs 04/07/2018 and 01/14/2018.  CT 12/29/2017. FINDINGS: 1329 hours. The heart size and mediastinal contours are stable status post median sternotomy and CABG. Loop recorder overlies the left ventricle. There is stable mild chronic interstitial prominence throughout the lungs. No edema, confluent airspace opacity, pleural effusion or pneumothorax. No acute osseous findings are seen. IMPRESSION: Stable postoperative chest.  No acute cardiopulmonary process. Electronically Signed   By: Richardean Sale M.D.   On: 08/19/2018 13:49     Assessment and Recommendation  84 y.o. male with known coronary artery disease status post coronary bypass graft PCI and stent placement of right coronary artery essential hypertension mixed hyperlipidemia chronic kidney disease stage III and bundle branch block with chest pain and pressure and elevated troponin consistent with non-ST elevation myocardial infarction 1.  Continue heparin aspirin and Brilinta for previous stenting as well as non-ST elevation microinfarction 2.  Continue ACE inhibitor beta-blocker for cardiovascular disease number 3.  Continue high intensity cholesterol therapy 4.  Continue procession to cardiac catheterization tomorrow for further evaluation and treatment options of cardiovascular issues and decision making thereafter  Signed, Serafina Royals M.D. FACC

## 2018-09-18 ENCOUNTER — Encounter
Admission: EM | Disposition: A | Discharge status: Home Health | Payer: Self-pay | Source: Home / Self Care | Attending: Internal Medicine

## 2018-09-18 HISTORY — PX: LEFT HEART CATH AND CORS/GRAFTS ANGIOGRAPHY: CATH118250

## 2018-09-18 LAB — GLUCOSE, CAPILLARY
Glucose-Capillary: 153 mg/dL — ABNORMAL HIGH (ref 70–99)
Glucose-Capillary: 107 mg/dL — ABNORMAL HIGH (ref 70–99)
Glucose-Capillary: 107 mg/dL — ABNORMAL HIGH (ref 70–99)
Glucose-Capillary: 87 mg/dL (ref 70–99)
Glucose-Capillary: 95 mg/dL (ref 70–99)
Glucose-Capillary: 125 mg/dL — ABNORMAL HIGH (ref 70–99)

## 2018-09-18 LAB — HEPARIN LEVEL (UNFRACTIONATED): HEPARIN UNFRACTIONATED: 0.32 [IU]/mL (ref 0.30–0.70)

## 2018-09-18 LAB — CBC
HEMATOCRIT: 25.9 % — AB (ref 39.0–52.0)
HEMOGLOBIN: 8.3 g/dL — AB (ref 13.0–17.0)
MCH: 27.8 pg (ref 26.0–34.0)
MCHC: 32 g/dL (ref 30.0–36.0)
MCV: 86.6 fL (ref 80.0–100.0)
Platelets: 74 10*3/uL — ABNORMAL LOW (ref 150–400)
RBC: 2.99 MIL/uL — AB (ref 4.22–5.81)
RDW: 15.9 % — ABNORMAL HIGH (ref 11.5–15.5)
WBC: 8.8 10*3/uL (ref 4.0–10.5)
nRBC: 0 % (ref 0.0–0.2)

## 2018-09-18 SURGERY — LEFT HEART CATH AND CORS/GRAFTS ANGIOGRAPHY
Anesthesia: Moderate Sedation

## 2018-09-18 MED ORDER — HEPARIN SODIUM (PORCINE) 5000 UNIT/ML IJ SOLN
5000.0000 [IU] | Freq: Three times a day (TID) | INTRAMUSCULAR | Status: DC
  Administered 2018-09-18 – 2018-09-19 (×2): 5000 [IU] via SUBCUTANEOUS
  Filled 2018-09-18 (×3): qty 1

## 2018-09-18 MED ORDER — IOPAMIDOL (ISOVUE-300) INJECTION 61%
INTRAVENOUS | Status: DC | PRN
  Administered 2018-09-18: 150 mL via INTRA_ARTERIAL

## 2018-09-18 MED ORDER — MIDAZOLAM HCL 2 MG/2ML IJ SOLN
INTRAMUSCULAR | Status: AC
  Filled 2018-09-18: qty 2

## 2018-09-18 MED ORDER — OXYCODONE-ACETAMINOPHEN 5-325 MG PO TABS: 1.0000 | ORAL_TABLET | ORAL | Status: DC | PRN

## 2018-09-18 MED ORDER — HEPARIN (PORCINE) IN NACL 1000-0.9 UT/500ML-% IV SOLN
INTRAVENOUS | Status: AC
  Filled 2018-09-18: qty 1000

## 2018-09-18 MED ORDER — FENTANYL CITRATE (PF) 100 MCG/2ML IJ SOLN
INTRAMUSCULAR | Status: AC
  Filled 2018-09-18: qty 2

## 2018-09-18 MED ORDER — MIDAZOLAM HCL 2 MG/2ML IJ SOLN
INTRAMUSCULAR | Status: DC | PRN
  Administered 2018-09-18: 1 mg via INTRAVENOUS

## 2018-09-18 SURGICAL SUPPLY — 12 items
CATH ANGIO 5F JB2 100CM (CATHETERS) ×2 IMPLANT
CATH INFINITI 5 FR IM (CATHETERS) ×2 IMPLANT
CATH INFINITI 5FR ANG PIGTAIL (CATHETERS) ×2 IMPLANT
CATH INFINITI 5FR JL4 (CATHETERS) ×2 IMPLANT
CATH INFINITI JR4 5F (CATHETERS) ×2 IMPLANT
DEVICE CLOSURE MYNXGRIP 5F (Vascular Products) ×2 IMPLANT
KIT MANI 3VAL PERCEP (MISCELLANEOUS) ×3 IMPLANT
NDL PERC 18GX7CM (NEEDLE) IMPLANT
NEEDLE PERC 18GX7CM (NEEDLE) ×3 IMPLANT
PACK CARDIAC CATH (CUSTOM PROCEDURE TRAY) ×3 IMPLANT
SHEATH AVANTI 5FR X 11CM (SHEATH) ×2 IMPLANT
WIRE GUIDERIGHT .035X150 (WIRE) ×2 IMPLANT

## 2018-09-18 NOTE — Progress Notes (Addendum)
[facility] [place] NAME: Dwayne Terry    MR#:  [id number]  DATE OF BIRTH:  January 21, 1930  SUBJECTIVE:  CHIEF COMPLAINT:   Chief Complaint  Patient presents with  . Nausea  . Emesis  . Chest Pain   Patient seen and evaluated today this morning No new episodes of chest pain Due for cardiac catheterization   REVIEW OF SYSTEMS:    Review of Systems  Constitutional: Positive for malaise/fatigue. Negative for chills and fever.  HENT: Negative for sore throat.   Eyes: Negative for blurred vision, double vision and pain.  Respiratory: Negative for cough, hemoptysis, shortness of breath and wheezing.   Cardiovascular: Positive for chest pain. Negative for palpitations, orthopnea and leg swelling.  Gastrointestinal: Negative for abdominal pain, constipation, diarrhea, heartburn, nausea and vomiting.  Genitourinary: Negative for dysuria and hematuria.  Musculoskeletal: Negative for back pain and joint pain.  Skin: Negative for rash.  Neurological: Negative for sensory change, speech change, focal weakness and headaches.  Endo/Heme/Allergies: Does not bruise/bleed easily.  Psychiatric/Behavioral: Negative for depression. The patient is not nervous/anxious.     DRUG ALLERGIES:   Allergies  Allergen Reactions  . Antihistamines, Chlorpheniramine-Type Other (See Comments)    Reaction: unknown  . Nitroglycerin Other (See Comments)    Reaction: hypotension    VITALS:  Blood pressure 115/60, pulse 61, temperature 97.6 F (36.4 C), temperature source Oral, resp. rate 14, height 5\' 7"  (1.702 m), weight 66.8 kg, SpO2 100 %.  PHYSICAL EXAMINATION:   Physical Exam  GENERAL:  84 y.o.-year-old patient lying in the bed with no acute distress.  EYES: Pupils equal, round, reactive to light and accommodation. No scleral icterus. Extraocular muscles intact.  HEENT: Head atraumatic, normocephalic. Oropharynx and nasopharynx clear.  NECK:  Supple, no  jugular venous distention. No thyroid enlargement, no tenderness.  LUNGS: Normal breath sounds bilaterally, no wheezing, rales, rhonchi. No use of accessory muscles of respiration.  CARDIOVASCULAR: S1, S2 normal. No murmurs, rubs, or gallops.  ABDOMEN: Soft, nontender, nondistended. Bowel sounds present. No organomegaly or mass.  EXTREMITIES: No cyanosis, clubbing or edema b/l.    NEUROLOGIC: Cranial nerves II through XII are intact. No focal Motor or sensory deficits b/l.   PSYCHIATRIC: The patient is alert and oriented x 3.  SKIN: No obvious rash, lesion, or ulcer.   LABORATORY PANEL:   CBC Recent Labs  Lab 09/18/18 0423  WBC 8.8  HGB 8.3*  HCT 25.9*  PLT 74*   ------------------------------------------------------------------------------------------------------------------ Chemistries  Recent Labs  Lab 09/15/18 2221  NA 137  K 4.3  CL 108  CO2 21*  GLUCOSE 316*  BUN 28*  CREATININE 1.35*  CALCIUM 8.7*   ------------------------------------------------------------------------------------------------------------------  Cardiac Enzymes Recent Labs  Lab 09/16/18 1652  TROPONINI 0.22*   ------------------------------------------------------------------------------------------------------------------  RADIOLOGY:  No results found.   ASSESSMENT AND PLAN:   - NSTEMI Dual antiplatelet therapy High intensity statin Status post cardiac cath and stents placement today  - HTN - Well controlled Continue home meds  -Prostate cancer with mets.  Outpatient follow-up with oncology  -diabetes mellitus type 2.  Sliding scale insulin  -DVT prophylaxis with subcu heparin    All the records are reviewed and case discussed with Care Management/Social Worker Management plans discussed with the patient, family and they are in agreement.  CODE STATUS: DNR  DVT Prophylaxis: SCDs  TOTAL TIME TAKING CARE OF THIS PATIENT: 34 minutes.   POSSIBLE D/C IN 1-2 DAYS,  DEPENDING ON CLINICAL  CONDITION.  Saundra Shelling M.D on 09/18/2018 at 3:18 PM  Between 7am to 6pm - Pager - [phone]  After 6pm go to [email or web address] - password [email or web address] [facility]  Office  [phone]  CC: Primary care physician; Kirk Ruths, MD  Note: This dictation was prepared with Dragon dictation along with smaller phrase technology. Any transcriptional errors that result from this process are unintentional.

## 2018-09-18 NOTE — Progress Notes (Signed)
Patient's groin clipped for cardiology procedure this morning. Will endorse.

## 2018-09-18 NOTE — Consult Note (Signed)
ANTICOAGULATION CONSULT NOTE - Initial Consult  Pharmacy Consult for subcutaneous heparin for VTE prophylaxis Indication: VTE prophylaxis  Allergies  Allergen Reactions  . Antihistamines, Chlorpheniramine-Type Other (See Comments)    Reaction: unknown  . Nitroglycerin Other (See Comments)    Reaction: hypotension    Patient Measurements: Height: 5\' 7"  (170.2 cm) Weight: 147 lb 4.3 oz (66.8 kg) IBW/kg (Calculated) : 66.1 Heparin Dosing Weight: 65.4  Vital Signs: Temp: 97.6 F (36.4 C) (10/21 1206) Temp Source: Oral (10/21 1206) BP: 115/60 (10/21 1500) Pulse Rate: 61 (10/21 1500)  Labs: Recent Labs    09/15/18 2221 09/16/18 0732 09/16/18 1057 09/16/18 1437 09/16/18 1652 09/16/18 2257 09/17/18 0559 09/18/18 0423  HGB 9.4*  --   --  9.1*  --   --  8.3* 8.3*  HCT 29.0*  --   --  28.1*  --   --  26.1* 25.9*  PLT 118*  --   --  108*  --   --  87* 74*  APTT  --   --   --  24  --   --   --   --   LABPROT  --   --   --  14.1  --   --   --   --   INR  --   --   --  1.10  --   --   --   --   HEPARINUNFRC  --   --   --   --   --  0.38 0.37 0.32  CREATININE 1.35*  --   --   --   --   --   --   --   TROPONINI 0.05* 0.26* 0.24*  --  0.22*  --   --   --     Estimated Creatinine Clearance: 36.7 mL/min (A) (by C-G formula based on SCr of 1.35 mg/dL (H)).   Medical History: Past Medical History:  Diagnosis Date  . Cancer ([place])    bone marrow, prostate cancer  . Diabetes mellitus without complication ([place])   . Hyperlipidemia   . MI (mitral incompetence)   . MI (myocardial infarction) ([place])     Medications:    Assessment: 84 year old male who presented with chest pain. Troponins elevated on admission. Patient taking ticagrelor and aspirin PTA, no anticoagulation. Pharmacy consulted for heparin VTE dosing.  Goal of Therapy:  Monitor platelets by anticoagulation protocol: Yes   Plan:  Heparin 5000 units subcutaneously every 8 hours. Continue to monitor H&H and  platelets  Forrest Moron, PharmD 09/18/2018,3:48 PM

## 2018-09-18 NOTE — Progress Notes (Signed)
[facility] Encounter Note  Patient: Dwayne Terry / Admit Date: 09/15/2018 / Date of Encounter: 09/18/2018, 3:32 PM   Subjective: Has full resolution of chest discomfort and pressure.  Troponin peaked at 0.24 possibly consistent with non-ST elevation myocardial infarction.  Patient does have anemia and chronic kidney disease which may have some bearing on his chest pain and pressure as well.  The patient has had multiple myeloma with episodes of chest discomfort most consistent with gastroesophageal reflux as well factoring into his current symptoms.  The patient has been on appropriate medication management for previous coronary artery disease cardiovascular disease including Brilinta aspirin for recent PCI and stent placement Cardiac catheterization showing inferior hypokinesis with ejection fraction of 40% And has patent graft to left anterior descending artery and diagonal artery Patient has patent long stent throughout the entire right coronary artery with distal stenosis of 75% of the PDA possibly consistent with symptoms the patient is having unable to perform PCI to small artery  Review of Systems: Positive for: Shortness of breath Negative for: Vision change, hearing change, syncope, dizziness, nausea, vomiting,diarrhea, bloody stool, stomach pain, cough, congestion, diaphoresis, urinary frequency, urinary pain,skin lesions, skin rashes Others previously listed  Objective: Telemetry: Normal sinus rhythm Physical Exam: Blood pressure 115/60, pulse 61, temperature 97.6 F (36.4 C), temperature source Oral, resp. rate 14, height _0  (1.702 m), weight 66.8 kg, SpO2 100 %. Body mass index is 23.07 kg/m. General: Well developed, well nourished, in no acute distress. Head: Normocephalic, atraumatic, sclera non-icteric, no xanthomas, nares are without discharge. Neck: No apparent masses Lungs: Normal respirations with no wheezes, no rhonchi, no rales , no  crackles   Heart: Regular rate and rhythm, normal S1 S2, no murmur, no rub, no gallop, PMI is normal size and placement, carotid upstroke normal without bruit, jugular venous pressure normal Abdomen: Soft, non-tender, non-distended with normoactive bowel sounds. No hepatosplenomegaly. Abdominal aorta is normal size without bruit Extremities: No edema, no clubbing, no cyanosis, no ulcers,  Peripheral: 2+ radial, 2+ femoral, 2+ dorsal pedal pulses Neuro: Alert and oriented. Moves all extremities spontaneously. Psych:  Responds to questions appropriately with a normal affect.   Intake/Output Summary (Last 24 hours) at 09/18/2018 1532 Last data filed at 09/18/2018 1027 Gross per 24 hour  Intake 197.62 ml  Output 0 ml  Net 197.62 ml    Inpatient Medications:  . acyclovir  200 mg Oral BID  . aspirin EC  81 mg Oral Daily  . atorvastatin  80 mg Oral Daily  . carvedilol  3.125 mg Oral BID WC  . docusate sodium  100 mg Oral BID  . finasteride  2.5 mg Oral Daily  . insulin aspart  0-5 Units Subcutaneous Once  . insulin aspart  0-9 Units Subcutaneous TID AC & HS  . lisinopril  2.5 mg Oral Daily  . sodium chloride flush  3 mL Intravenous Q12H  . ticagrelor  90 mg Oral BID   Infusions:    Labs: Recent Labs    09/15/18 2221  NA 137  K 4.3  CL 108  CO2 21*  GLUCOSE 316*  BUN 28*  CREATININE 1.35*  CALCIUM 8.7*   No results for input(s): AST, ALT, ALKPHOS, BILITOT, PROT, ALBUMIN in the last 72 hours. Recent Labs    09/17/18 0559 09/18/18 0423  WBC 9.7 8.8  HGB 8.3* 8.3*  HCT 26.1* 25.9*  MCV 87.0 86.6  PLT 87* 74*   Recent Labs    09/15/18 2221  09/16/18 0732 09/16/18 1057 09/16/18 1652  TROPONINI 0.05* 0.26* 0.24* 0.22*   Invalid input(s): POCBNP No results for input(s): HGBA1C in the last 72 hours.   Weights: Filed Weights   09/17/18 0617 09/18/18 0253 09/18/18 1206  Weight: 67.7 kg 66.8 kg 66.8 kg     Radiology/Studies:  Dg Chest 2 View  Result Date:  09/15/2018 CLINICAL DATA:  Nausea, vomiting and chest pain EXAM: CHEST - 2 VIEW COMPARISON:  08/19/2018 FINDINGS: Remote median sternotomy for CABG. Diffuse interstitial coarsening. No pulmonary edema or focal airspace consolidation. No pleural effusion or pneumothorax. IMPRESSION: No active cardiopulmonary disease. Electronically Signed   By: Ulyses Jarred M.D.   On: 09/15/2018 22:48     Assessment and Recommendation  84 y.o. male with known coronary artery disease status post coronary bypass graft PCI and stent placement of right coronary artery essential hypertension mixed hyperlipidemia chronic kidney disease stage III and bundle branch block with chest pain and pressure and elevated troponin consistent with non-ST elevation myocardial infarction With cardiac catheterization showing mild LV systolic dysfunction patent grafts and stent with distal vessel coronary artery disease possibly consistent with symptoms although on maximal medication management not amenable to PCI 1.  Continue aspirin and Brilinta for previous stenting as well as non-ST elevation microinfarction 2.  Continue ACE inhibitor beta-blocker for cardiovascular disease treatment 3.  Continue high intensity cholesterol therapy 4.  Further evaluation and treatment options of possible other causes of chest discomfort including anemia chronic kidney disease treatment for multiple myeloma chemotherapy and/or gastroesophageal reflux 5.  Okay for discharge home from cardiac standpoint if tolerating ambulation well with follow-up next week for further evaluation and treatment options of medication management  Signed, Serafina Royals M.D. FACC

## 2018-09-18 NOTE — Progress Notes (Signed)
Dr. Nehemiah Massed at bedside, speaking with pt. & family re: cath results. Daughter & spouse verbalize understanding.

## 2018-09-18 NOTE — Progress Notes (Signed)
[place] for heparin Indication: chest pain/ACS  Allergies  Allergen Reactions  . Antihistamines, Chlorpheniramine-Type Other (See Comments)    Reaction: unknown  . Nitroglycerin Other (See Comments)    Reaction: hypotension    Patient Measurements: Height: 5\' 7"  (170.2 cm) Weight: 147 lb 4.8 oz (66.8 kg) IBW/kg (Calculated) : 66.1 Heparin Dosing Weight: 65.4 kg  Vital Signs: Temp: 98.3 F (36.8 C) (10/21 0253) Temp Source: Oral (10/21 0253) BP: 130/54 (10/21 0253) Pulse Rate: 63 (10/21 0253)  Labs: Recent Labs    09/15/18 2221 09/16/18 0732 09/16/18 1057 09/16/18 1437 09/16/18 1652 09/16/18 2257 09/17/18 0559 09/18/18 0423  HGB 9.4*  --   --  9.1*  --   --  8.3* 8.3*  HCT 29.0*  --   --  28.1*  --   --  26.1* 25.9*  PLT 118*  --   --  108*  --   --  87* 74*  APTT  --   --   --  24  --   --   --   --   LABPROT  --   --   --  14.1  --   --   --   --   INR  --   --   --  1.10  --   --   --   --   HEPARINUNFRC  --   --   --   --   --  0.38 0.37 0.32  CREATININE 1.35*  --   --   --   --   --   --   --   TROPONINI 0.05* 0.26* 0.24*  --  0.22*  --   --   --     Estimated Creatinine Clearance: 36.7 mL/min (A) (by C-G formula based on SCr of 1.35 mg/dL (H)).   Medical History: Past Medical History:  Diagnosis Date  . Cancer ([place])    bone marrow, prostate cancer  . Diabetes mellitus without complication ([place])   . Hyperlipidemia   . MI (mitral incompetence)   . MI (myocardial infarction) [facility])     Assessment: 84 year old male who presented with chest pain. Troponins elevated on admission. Patient taking ticagrelor and aspirin PTA, no anticoagulation. Pharmacy consulted for heparin dosing.  Goal of Therapy:  Heparin level 0.3-0.7 units/ml Monitor platelets by anticoagulation protocol: Yes   Plan:  Will give heparin 3000 unit bolus Start heparin drip at 750 units/hr Heparin level ordered for 2300 CBC with morning  labs  10/19 PM heparin level 0.38. Continue current regimen. Recheck heparin level and CBC with tomorrow AM labs.  10/20 AM heparin level 0.37. Continue current regimen. Recheck heparin level and CBC with tomorrow AM labs.  10/21 AM heparin level 0.32. Continue current regimen. Recheck heparin level and CBC with tomorrow AM labs.  Chapman Matteucci S, PharmD 09/18/2018 6:12 AM

## 2018-09-19 ENCOUNTER — Encounter: Payer: Self-pay | Admitting: *Deleted

## 2018-09-19 LAB — CBC
HEMATOCRIT: 26 % — AB (ref 39.0–52.0)
HEMOGLOBIN: 8.3 g/dL — AB (ref 13.0–17.0)
MCH: 27.6 pg (ref 26.0–34.0)
MCHC: 31.9 g/dL (ref 30.0–36.0)
MCV: 86.4 fL (ref 80.0–100.0)
NRBC: 0 % (ref 0.0–0.2)
Platelets: 81 10*3/uL — ABNORMAL LOW (ref 150–400)
RBC: 3.01 MIL/uL — ABNORMAL LOW (ref 4.22–5.81)
RDW: 15.9 % — AB (ref 11.5–15.5)
WBC: 8.7 10*3/uL (ref 4.0–10.5)

## 2018-09-19 LAB — HEMOGLOBIN A1C
Hgb A1c MFr Bld: 7.2 % — ABNORMAL HIGH (ref 4.8–5.6)
Mean Plasma Glucose: 160 mg/dL

## 2018-09-19 LAB — GLUCOSE, CAPILLARY: Glucose-Capillary: 92 mg/dL (ref 70–99)

## 2018-09-19 MED ORDER — TICAGRELOR 90 MG PO TABS: 90.0000 mg | ORAL_TABLET | Freq: Two times a day (BID) | ORAL | 0 refills | Status: AC

## 2018-09-19 NOTE — Progress Notes (Signed)
IVs and tele removed from patient. Discharge instructions given to patient along with hard copy prescription. Verbalized understanding. No acute distress. Wife at bedside and will transport patient home.

## 2018-09-19 NOTE — Care Management Note (Signed)
Case Management Note  Patient Details  Name: Dwayne Terry MRN: [id number] Date of Birth: 03-09-1930  Subjective/Objective:  Patient from home with wife with chest pain/NSTEMI.  Has been on Brilinta for a few months.  Has not used the 30 day free coupon; provided to patient with separate prescription.  He gets his medications filled through the [place].  Current with PCP.  Denies difficulty obtaining medications or with transportation.  [place], RN asked about patient being set up with home health.  Daughter states he seems weak and could benefit from RN, PT.  Referral made to [facility] after offering choice.  Accepted by Corene Cornea.  Orders placed by Dr. Estanislado Pandy.  Discharging today.                Action/Plan:   Expected Discharge Date:  09/19/18               Expected Discharge Plan:  [place]  In-House Referral:     Discharge planning Services  CM Consult  Post Acute Care Choice:    Choice offered to:  Patient  DME Arranged:    DME Agency:     HH Arranged:  RN, PT [place] Agency:  [place]  Status of Service:  Completed, signed off  If discussed at [place] of Stay Meetings, dates discussed:    Additional Comments:  Elza Rafter, RN 09/19/2018, 10:56 AM

## 2018-09-19 NOTE — Plan of Care (Signed)
Right groin cath site, at a level 0, no bleeding/bruising. Medically managing.  Problem: Activity: Goal: Risk for activity intolerance will decrease Outcome: Progressing   Problem: Nutrition: Goal: Adequate nutrition will be maintained Outcome: Progressing   Problem: Coping: Goal: Level of anxiety will decrease Outcome: Progressing   Problem: Elimination: Goal: Will not experience complications related to urinary retention Outcome: Progressing   Problem: Pain Managment: Goal: General experience of comfort will improve Outcome: Progressing Note:  No complaints of pain this shift   Problem: Safety: Goal: Ability to remain free from injury will improve Outcome: Progressing   Problem: Skin Integrity: Goal: Risk for impaired skin integrity will decrease Outcome: Progressing   Problem: Education: Goal: Knowledge of General Education information will improve Description Including pain rating scale, medication(s)/side effects and non-pharmacologic comfort measures Outcome: Completed/Met

## 2018-09-19 NOTE — Discharge Summary (Signed)
[name] NAME: Dwayne Terry    MR#:  [id number]  DATE OF BIRTH:  Jan 22, 1930  DATE OF ADMISSION:  09/15/2018 ADMITTING PHYSICIAN: Dwayne Foreman, MD  DATE OF DISCHARGE: 09/19/2018 11:41 AM  PRIMARY CARE PHYSICIAN: Dwayne Ruths, MD   ADMISSION DIAGNOSIS:  Chest pain, unspecified type [R07.9] Hyperlipidemia Coronary artery disease Diabetes mellitus type 2 Prostate cancer DISCHARGE DIAGNOSIS:  Active Problems:   Chest pain   NSTEMI (non-ST elevated myocardial infarction) ([place]) Coronary artery disease Type 2 diabetes mellitus Hyperlipidemia  SECONDARY DIAGNOSIS:   Past Medical History:  Diagnosis Date  . Cancer ([place])    bone marrow, prostate cancer  . Diabetes mellitus without complication ([place])   . Hyperlipidemia   . MI (mitral incompetence)   . MI (myocardial infarction) ([place])      ADMITTING HISTORY The patient with past medical history of metastatic prostate cancer, diabetes, hyperlipidemia and coronary artery disease status post CABG and multiple angioplasty and stent placement presents the emergency department complaining of chest pain.  The patient's pain occurred while he was walking around his room.  He has not been feeling well all day and has had some cough as well as one episode of nonbloody nonbilious emesis.  He denies fever.  His chest pain was associated with mild shortness of breath at onset has now improved.  First troponin was mildly elevated.  At the time of this dictation his second troponin has increased fourfold to 0.26 ng/mL.  Due to his cardiac risk factors as well as increase in troponin's emergency department staff, hospitalist service for admission.  HOSPITAL COURSE:  Patient admitted to telemetry.  Patient was evaluated by cardiology during hospitalization.  He received IV heparin drip for anticoagulation.  Troponins were significantly elevated.  Patient was treated for non-STEMI.  He  underwent cardiac catheterization and intervention.  Tolerated procedure well.  Ost LAD to Prox LAD lesion is 100% stenosed.  1st Diag lesion is 40% stenosed.  Ost Cx to Prox Cx lesion is 100% stenosed.  Mid RCA lesion is 75% stenosed.  Dist RCA lesion is 60% stenosed.  RPDA lesion is 75% stenosed.  SVG and is moderate in size.  Dist Graft lesion is 60% stenosed.  LIMA and is moderate in size.  Previously placed Ost RCA to Prox RCA stent (unknown type) is widely patent.  Previously placed Prox RCA to Mid RCA stent (unknown type) is widely patent. Left ventricle shows mild inferior hypokinesis with ejection fraction of 40%  Patient has a total occlusion of distal left main Patent LIMA to the LAD Patent SVG to diagonal artery Patent stent throughout the entire length of right coronary artery without further significant stenosis Stenosis of distal PDA of 75% unchanged from 2018  Plan Continue aggressive medical management of cardiovascular disease risk factors and coronary artery disease Dual antiplatelet therapy High intensity cholesterol therapy Hypertension control with beta-blocker and lisinopril Cardiac rehabilitation CONSULTS OBTAINED:  Treatment Team:  Corey Skains, MD  DRUG ALLERGIES:   Allergies  Allergen Reactions  . Antihistamines, Chlorpheniramine-Type Other (See Comments)    Reaction: unknown  . Nitroglycerin Other (See Comments)    Reaction: hypotension    DISCHARGE MEDICATIONS:   Allergies as of 09/19/2018      Reactions   Antihistamines, Chlorpheniramine-type Other (See Comments)   Reaction: unknown   Nitroglycerin Other (See Comments)   Reaction: hypotension      Medication List    TAKE these  medications   acyclovir 200 MG capsule Commonly known as:  ZOVIRAX Take 200 mg by mouth 2 (two) times daily.   ALPRAZolam 0.25 MG tablet Commonly known as:  XANAX Take 1 tablet (0.25 mg total) by mouth 2 (two) times daily as needed for  anxiety.   aspirin EC 81 MG tablet Take 1 tablet (81 mg total) by mouth daily.   atorvastatin 80 MG tablet Commonly known as:  LIPITOR Take 1 tablet by mouth daily.   carvedilol 3.125 MG tablet Commonly known as:  COREG Take 1 tablet (3.125 mg total) by mouth 2 (two) times daily with a meal.   dexamethasone 2 MG tablet Commonly known as:  DECADRON Take 8.5 mg by mouth See admin instructions. Take 8.5 mg on Thursday and Friday.   finasteride 5 MG tablet Commonly known as:  PROSCAR Take 2.5 mg by mouth daily.   isosorbide mononitrate 30 MG 24 hr tablet Commonly known as:  IMDUR Take 0.5 tablets (15 mg total) by mouth daily.   lansoprazole 30 MG capsule Commonly known as:  PREVACID Take 30 mg by mouth 2 (two) times daily as needed.   lisinopril 2.5 MG tablet Commonly known as:  PRINIVIL,ZESTRIL Take 1 tablet (2.5 mg total) by mouth daily.   MULTI-VITAMINS Tabs Take 1 tablet by mouth daily.   ticagrelor 90 MG Tabs tablet Commonly known as:  BRILINTA Take 1 tablet (90 mg total) by mouth 2 (two) times daily.       Today  Patient is seen and evaluated on day of discharge No chest pain Or shortness of breath VITAL SIGNS:  Blood pressure (!) 127/53, pulse 65, temperature (!) 97.3 F (36.3 C), temperature source Oral, resp. rate 18, height 5\' 7"  (1.702 m), weight 66.9 kg, SpO2 99 %.  I/O:    Intake/Output Summary (Last 24 hours) at 09/19/2018 1327 Last data filed at 09/19/2018 1002 Gross per 24 hour  Intake 843 ml  Output 1150 ml  Net -307 ml    PHYSICAL EXAMINATION:  Physical Exam  GENERAL:  84 y.o.-year-old patient lying in the bed with no acute distress.  LUNGS: Normal breath sounds bilaterally, no wheezing, rales,rhonchi or crepitation. No use of accessory muscles of respiration.  CARDIOVASCULAR: S1, S2 normal. No murmurs, rubs, or gallops.  ABDOMEN: Soft, non-tender, non-distended. Bowel sounds present. No organomegaly or mass.  NEUROLOGIC: Moves all 4  extremities. PSYCHIATRIC: The patient is alert and oriented x 3.  SKIN: No obvious rash, lesion, or ulcer.   DATA REVIEW:   CBC Recent Labs  Lab 09/19/18 0409  WBC 8.7  HGB 8.3*  HCT 26.0*  PLT 81*    Chemistries  Recent Labs  Lab 09/15/18 2221  NA 137  K 4.3  CL 108  CO2 21*  GLUCOSE 316*  BUN 28*  CREATININE 1.35*  CALCIUM 8.7*    Cardiac Enzymes Recent Labs  Lab 09/16/18 1652  TROPONINI 0.22*    Microbiology Results  No results found for this or any previous visit.  RADIOLOGY:  No results found.  Follow up with PCP in 1 week.  Management plans discussed with the patient, family and they are in agreement.  CODE STATUS:     Code Status Orders  (From admission, onward)         Start     Ordered   09/16/18 0500  Do not attempt resuscitation (DNR)  Continuous    Question Answer Comment  In the event of cardiac or respiratory ARREST Do not  call a "code blue"   In the event of cardiac or respiratory ARREST Do not perform Intubation, CPR, defibrillation or ACLS   In the event of cardiac or respiratory ARREST Use medication by any route, position, wound care, and other measures to relive pain and suffering. May use oxygen, suction and manual treatment of airway obstruction as needed for comfort.   Comments RN may pronounce      09/16/18 0459        Code Status History    Date Active Date Inactive Code Status Order ID Comments User Context   08/19/2018 1555 08/20/2018 1418 DNR [id number]  Nicholes Mango, MD Inpatient   08/19/2018 1512 08/19/2018 1555 DNR [id number]  Nicholes Mango, MD ED   04/07/2018 1620 04/08/2018 1645 Full Code [id number]  Saundra Shelling, MD ED   01/16/2018 1352 01/18/2018 1914 Full Code [id number]  Epifanio Lesches, MD ED   01/14/2018 0335 01/14/2018 1754 Full Code [id number]  Salary, Avel Peace, MD Inpatient   12/30/2017 0220 12/31/2017 1703 Full Code [id number]  Amelia Jo, MD Inpatient   10/29/2017 0228 10/31/2017 1900 Full Code [id number]   Lance Coon, MD ED   09/17/2017 0058 09/20/2017 1524 Full Code [id number]  Lance Coon, MD Inpatient   06/10/2017 0324 06/11/2017 1613 Full Code [id number]  Dwayne Foreman, MD Inpatient   04/29/2017 0512 04/30/2017 1437 Full Code [id number]  Dwayne Foreman, MD Inpatient   01/27/2017 2356 01/29/2017 1430 Full Code [id number]  Hugelmeyer, Ubaldo Glassing, DO Inpatient    Advance Directive Documentation     Most Recent Value  Type of Advance Directive  Healthcare Power of Attorney  Pre-existing out of facility DNR order (yellow form or pink MOST form)  -  "MOST" Form in Place?  -      TOTAL TIME TAKING CARE OF THIS PATIENT ON DAY OF DISCHARGE: more than 34 minutes.   Saundra Shelling M.D on 09/19/2018 at 1:27 PM  Between 7am to 6pm - Pager - [phone]  After 6pm go to [email or web address] - password [email or web address] [facility]  Office  [phone]  CC: Primary care physician; Dwayne Ruths, MD  Note: This dictation was prepared with Dragon dictation along with smaller phrase technology. Any transcriptional errors that result from this process are unintentional.

## 2018-09-22 ENCOUNTER — Telehealth: Payer: Self-pay

## 2018-09-22 NOTE — Telephone Encounter (Signed)
Flagged on EMMI report for having wrong number.  Called and spoke with patient's wife due to patient being unavailable.  They would like to opt out of receiving EMMI calls at this time as home health already checking in on patient.  I thanked her for her time.

## 2019-01-12 ENCOUNTER — Other Ambulatory Visit: Payer: Self-pay | Admitting: Gastroenterology

## 2019-01-12 DIAGNOSIS — R042 Hemoptysis: Secondary | ICD-10-CM

## 2019-01-16 ENCOUNTER — Ambulatory Visit: Payer: Medicare HMO

## 2019-02-02 IMAGING — DX DG CHEST 1V PORT
1 series · 1 of 1 positions shown · non-contrast
Comparison: Chest radiograph dated 04/29/2017

CLINICAL DATA: 86-year-old male with chest pain.

EXAM:
PORTABLE CHEST 1 VIEW

[chest ap]
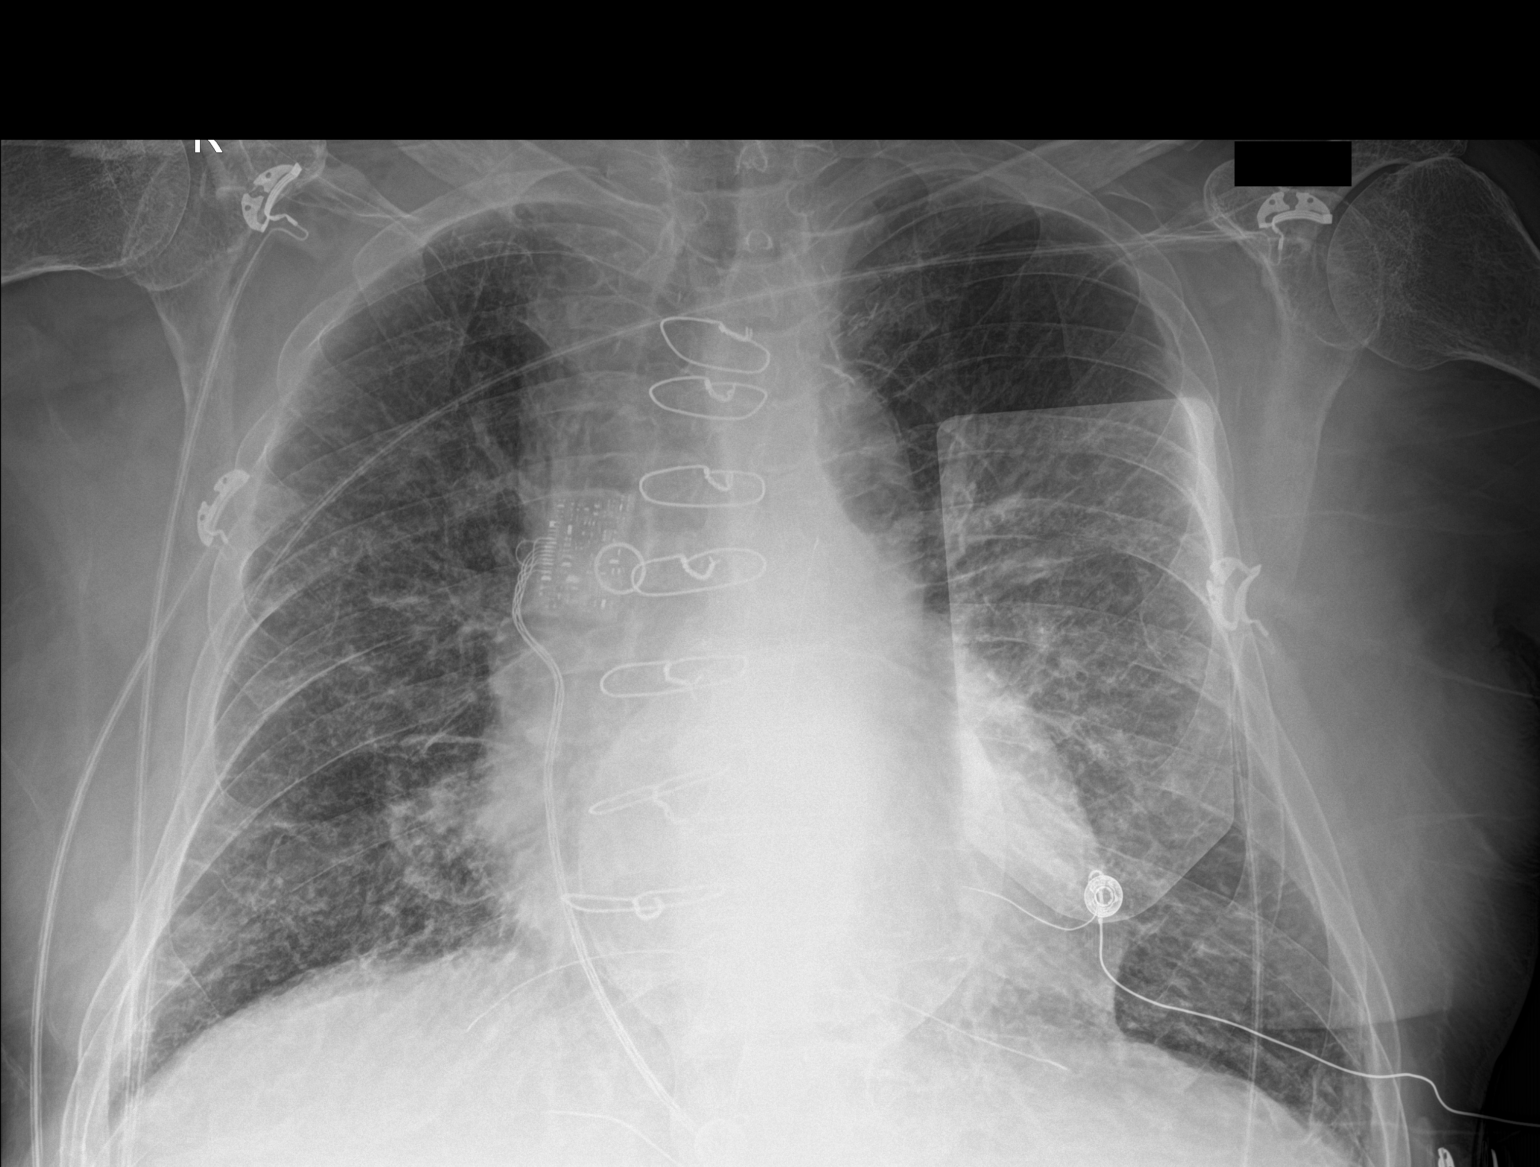

[1 of 1 positions shown; findings below may reference images not displayed]

FINDINGS: Interval increase in the interstitial and perihilar densities with
[place] B-lines likely representing worsening of interstitial edema.
Atypical pneumonia is not excluded. Clinical correlation is
recommended. There is no focal consolidation, pleural effusion, or
pneumothorax. Stable mild cardiomegaly. Median sternotomy wires and
CABG vascular clips noted. Osteopenia with degenerative changes of
the spine. No acute osseous pathology.
IMPRESSION: Probable mild interstitial edema slightly worsened compared to prior
study. Superimposed pneumonia is not excluded. Clinical correlation
is recommended.

## 2019-02-12 ENCOUNTER — Other Ambulatory Visit: Payer: Self-pay | Admitting: Gastroenterology

## 2019-02-12 DIAGNOSIS — R042 Hemoptysis: Secondary | ICD-10-CM

## 2019-02-17 ENCOUNTER — Inpatient Hospital Stay
Admission: EM | Admit: 2019-02-17 | Discharge: 2019-02-18 | DRG: 303 | Disposition: A | Discharge status: Home Health | Payer: Medicare Other | Attending: Internal Medicine | Admitting: Internal Medicine

## 2019-02-17 ENCOUNTER — Encounter: Payer: Self-pay | Admitting: Emergency Medicine

## 2019-02-17 ENCOUNTER — Encounter
Admission: EM | Disposition: A | Discharge status: Home Health | Payer: Self-pay | Source: Home / Self Care | Attending: Internal Medicine

## 2019-02-17 ENCOUNTER — Other Ambulatory Visit: Payer: Self-pay

## 2019-02-17 ENCOUNTER — Emergency Department: Payer: Medicare Other

## 2019-02-17 DIAGNOSIS — I4891 Unspecified atrial fibrillation: Secondary | ICD-10-CM

## 2019-02-17 DIAGNOSIS — Z8249 Family history of ischemic heart disease and other diseases of the circulatory system: Secondary | ICD-10-CM

## 2019-02-17 DIAGNOSIS — Z7982 Long term (current) use of aspirin: Secondary | ICD-10-CM | POA: Diagnosis not present

## 2019-02-17 DIAGNOSIS — E785 Hyperlipidemia, unspecified: Secondary | ICD-10-CM | POA: Diagnosis present

## 2019-02-17 DIAGNOSIS — T451X5A Adverse effect of antineoplastic and immunosuppressive drugs, initial encounter: Secondary | ICD-10-CM | POA: Diagnosis present

## 2019-02-17 DIAGNOSIS — I110 Hypertensive heart disease with heart failure: Secondary | ICD-10-CM | POA: Diagnosis present

## 2019-02-17 DIAGNOSIS — I454 Nonspecific intraventricular block: Secondary | ICD-10-CM | POA: Diagnosis present

## 2019-02-17 DIAGNOSIS — Z952 Presence of prosthetic heart valve: Secondary | ICD-10-CM | POA: Diagnosis not present

## 2019-02-17 DIAGNOSIS — R296 Repeated falls: Secondary | ICD-10-CM | POA: Diagnosis present

## 2019-02-17 DIAGNOSIS — Z8546 Personal history of malignant neoplasm of prostate: Secondary | ICD-10-CM | POA: Diagnosis not present

## 2019-02-17 DIAGNOSIS — Z951 Presence of aortocoronary bypass graft: Secondary | ICD-10-CM

## 2019-02-17 DIAGNOSIS — Z87891 Personal history of nicotine dependence: Secondary | ICD-10-CM | POA: Diagnosis not present

## 2019-02-17 DIAGNOSIS — I480 Paroxysmal atrial fibrillation: Secondary | ICD-10-CM | POA: Diagnosis present

## 2019-02-17 DIAGNOSIS — F0390 Unspecified dementia without behavioral disturbance: Secondary | ICD-10-CM | POA: Diagnosis present

## 2019-02-17 DIAGNOSIS — G622 Polyneuropathy due to other toxic agents: Secondary | ICD-10-CM | POA: Diagnosis present

## 2019-02-17 DIAGNOSIS — I25110 Atherosclerotic heart disease of native coronary artery with unstable angina pectoris: Principal | ICD-10-CM | POA: Diagnosis present

## 2019-02-17 DIAGNOSIS — E119 Type 2 diabetes mellitus without complications: Secondary | ICD-10-CM | POA: Diagnosis present

## 2019-02-17 DIAGNOSIS — Z888 Allergy status to other drugs, medicaments and biological substances status: Secondary | ICD-10-CM

## 2019-02-17 DIAGNOSIS — C9000 Multiple myeloma not having achieved remission: Secondary | ICD-10-CM | POA: Diagnosis present

## 2019-02-17 DIAGNOSIS — Z95810 Presence of automatic (implantable) cardiac defibrillator: Secondary | ICD-10-CM | POA: Diagnosis not present

## 2019-02-17 DIAGNOSIS — I252 Old myocardial infarction: Secondary | ICD-10-CM

## 2019-02-17 DIAGNOSIS — R079 Chest pain, unspecified: Secondary | ICD-10-CM | POA: Diagnosis not present

## 2019-02-17 DIAGNOSIS — I509 Heart failure, unspecified: Secondary | ICD-10-CM | POA: Diagnosis present

## 2019-02-17 DIAGNOSIS — Z955 Presence of coronary angioplasty implant and graft: Secondary | ICD-10-CM | POA: Diagnosis not present

## 2019-02-17 DIAGNOSIS — R778 Other specified abnormalities of plasma proteins: Secondary | ICD-10-CM

## 2019-02-17 DIAGNOSIS — Z7952 Long term (current) use of systemic steroids: Secondary | ICD-10-CM

## 2019-02-17 DIAGNOSIS — I200 Unstable angina: Secondary | ICD-10-CM

## 2019-02-17 DIAGNOSIS — R7989 Other specified abnormal findings of blood chemistry: Secondary | ICD-10-CM

## 2019-02-17 DIAGNOSIS — I340 Nonrheumatic mitral (valve) insufficiency: Secondary | ICD-10-CM | POA: Diagnosis present

## 2019-02-17 DIAGNOSIS — Z79899 Other long term (current) drug therapy: Secondary | ICD-10-CM

## 2019-02-17 DIAGNOSIS — I255 Ischemic cardiomyopathy: Secondary | ICD-10-CM | POA: Diagnosis present

## 2019-02-17 HISTORY — DX: Multiple myeloma not having achieved remission: C90.00

## 2019-02-17 HISTORY — DX: Atherosclerotic heart disease of native coronary artery without angina pectoris: I25.10

## 2019-02-17 HISTORY — DX: Ischemic cardiomyopathy: I25.5

## 2019-02-17 LAB — LIPID PANEL
Cholesterol: 96 mg/dL (ref 0–200)
HDL: 34 mg/dL — AB (ref >40)
LDL CALC: 42 mg/dL (ref 0–99)
Total CHOL/HDL Ratio: 2.8 RATIO
Triglycerides: 101 mg/dL (ref <150)
VLDL: 20 mg/dL (ref 0–40)

## 2019-02-17 LAB — CBC WITH DIFFERENTIAL/PLATELET
Abs Immature Granulocytes: 0.06 10*3/uL (ref 0.00–0.07)
BASOS ABS: 0.1 10*3/uL (ref 0.0–0.1)
BASOS PCT: 1 %
EOS ABS: 0.2 10*3/uL (ref 0.0–0.5)
EOS PCT: 2 %
HCT: 31 % — ABNORMAL LOW (ref 39.0–52.0)
HEMOGLOBIN: 10 g/dL — AB (ref 13.0–17.0)
Immature Granulocytes: 1 %
LYMPHS PCT: 10 %
Lymphs Abs: 0.9 10*3/uL (ref 0.7–4.0)
MCH: 27.9 pg (ref 26.0–34.0)
MCHC: 32.3 g/dL (ref 30.0–36.0)
MCV: 86.6 fL (ref 80.0–100.0)
MONO ABS: 1 10*3/uL (ref 0.1–1.0)
Monocytes Relative: 11 %
NRBC: 0 % (ref 0.0–0.2)
Neutro Abs: 6.9 10*3/uL (ref 1.7–7.7)
Neutrophils Relative %: 75 %
PLATELETS: 149 10*3/uL — AB (ref 150–400)
RBC: 3.58 MIL/uL — AB (ref 4.22–5.81)
RDW: 18 % — AB (ref 11.5–15.5)
WBC: 9.2 10*3/uL (ref 4.0–10.5)

## 2019-02-17 LAB — COMPREHENSIVE METABOLIC PANEL
ALT: 16 U/L (ref 0–44)
AST: 19 U/L (ref 15–41)
Albumin: 3.5 g/dL (ref 3.5–5.0)
Alkaline Phosphatase: 40 U/L (ref 38–126)
Anion gap: 10 (ref 5–15)
BUN: 13 mg/dL (ref 8–23)
CHLORIDE: 102 mmol/L (ref 98–111)
CO2: 19 mmol/L — AB (ref 22–32)
Calcium: 8.5 mg/dL — ABNORMAL LOW (ref 8.9–10.3)
Creatinine, Ser: 1.1 mg/dL (ref 0.61–1.24)
GFR calc Af Amer: >60 mL/min (ref >60)
Glucose, Bld: 131 mg/dL — ABNORMAL HIGH (ref 70–99)
Potassium: 3.8 mmol/L (ref 3.5–5.1)
SODIUM: 131 mmol/L — AB (ref 135–145)
Total Bilirubin: 0.9 mg/dL (ref 0.3–1.2)
Total Protein: 6.6 g/dL (ref 6.5–8.1)

## 2019-02-17 LAB — SAMPLE TO BLOOD BANK

## 2019-02-17 LAB — TROPONIN I
TROPONIN I: 0.03 ng/mL — AB (ref <0.03)
Troponin I: 0.04 ng/mL (ref <0.03)
Troponin I: 0.04 ng/mL (ref <0.03)
Troponin I: 0.04 ng/mL (ref <0.03)

## 2019-02-17 LAB — HEMOGLOBIN A1C
HEMOGLOBIN A1C: 6.4 % — AB (ref 4.8–5.6)
Mean Plasma Glucose: 136.98 mg/dL

## 2019-02-17 LAB — PROTIME-INR
INR: 1.1 (ref 0.8–1.2)
Prothrombin Time: 13.9 seconds (ref 11.4–15.2)

## 2019-02-17 LAB — HEPARIN LEVEL (UNFRACTIONATED)
Heparin Unfractionated: 0.51 IU/mL (ref 0.30–0.70)
Heparin Unfractionated: 0.46 IU/mL (ref 0.30–0.70)

## 2019-02-17 LAB — APTT: APTT: 28 s (ref 24–36)

## 2019-02-17 SURGERY — CORONARY/GRAFT ACUTE MI REVASCULARIZATION
Anesthesia: Moderate Sedation

## 2019-02-17 MED ORDER — OXYCODONE HCL 5 MG PO TABS
2.5000 mg | ORAL_TABLET | Freq: Four times a day (QID) | ORAL | Status: DC | PRN
  Administered 2019-02-17 – 2019-02-18 (×2): 2.5 mg via ORAL
  Filled 2019-02-17 (×2): qty 1

## 2019-02-17 MED ORDER — SODIUM CHLORIDE 0.9 % IV SOLN
INTRAVENOUS | Status: DC
  Administered 2019-02-17: 20 mL via INTRAVENOUS

## 2019-02-17 MED ORDER — ATORVASTATIN CALCIUM 80 MG PO TABS
80.0000 mg | ORAL_TABLET | Freq: Every day | ORAL | Status: DC
  Administered 2019-02-17: 80 mg via ORAL
  Filled 2019-02-17: qty 1
  Filled 2019-02-17: qty 4
  Filled 2019-02-17: qty 1

## 2019-02-17 MED ORDER — ADULT MULTIVITAMIN W/MINERALS CH
1.0000 | ORAL_TABLET | Freq: Every day | ORAL | Status: DC
  Administered 2019-02-17 – 2019-02-18 (×2): 1 via ORAL
  Filled 2019-02-17 (×2): qty 1

## 2019-02-17 MED ORDER — ACETAMINOPHEN 325 MG PO TABS: 650.0000 mg | ORAL_TABLET | Freq: Four times a day (QID) | ORAL | Status: DC | PRN

## 2019-02-17 MED ORDER — METOPROLOL TARTRATE 5 MG/5ML IV SOLN
5.0000 mg | Freq: Once | INTRAVENOUS | Status: AC
  Administered 2019-02-17: 5 mg via INTRAVENOUS
  Filled 2019-02-17: qty 5

## 2019-02-17 MED ORDER — ASPIRIN EC 81 MG PO TBEC
81.0000 mg | DELAYED_RELEASE_TABLET | Freq: Every day | ORAL | Status: DC
  Administered 2019-02-17 – 2019-02-18 (×2): 81 mg via ORAL
  Filled 2019-02-17 (×2): qty 1

## 2019-02-17 MED ORDER — LISINOPRIL 5 MG PO TABS
2.5000 mg | ORAL_TABLET | Freq: Every day | ORAL | Status: DC
  Administered 2019-02-17 – 2019-02-18 (×2): 2.5 mg via ORAL
  Filled 2019-02-17 (×2): qty 1

## 2019-02-17 MED ORDER — ACYCLOVIR 200 MG PO CAPS
400.0000 mg | ORAL_CAPSULE | Freq: Two times a day (BID) | ORAL | Status: DC
  Administered 2019-02-17 – 2019-02-18 (×3): 400 mg via ORAL
  Filled 2019-02-17 (×4): qty 2

## 2019-02-17 MED ORDER — HEPARIN SODIUM (PORCINE) 5000 UNIT/ML IJ SOLN: 4000.0000 [IU] | Freq: Once | INTRAMUSCULAR | Status: DC

## 2019-02-17 MED ORDER — VITAMIN D3 25 MCG (1000 UNIT) PO TABS
2000.0000 [IU] | ORAL_TABLET | Freq: Every day | ORAL | Status: DC
  Administered 2019-02-17 – 2019-02-18 (×2): 2000 [IU] via ORAL
  Filled 2019-02-17 (×2): qty 2

## 2019-02-17 MED ORDER — ACETAMINOPHEN 650 MG RE SUPP: 650.0000 mg | Freq: Four times a day (QID) | RECTAL | Status: DC | PRN

## 2019-02-17 MED ORDER — ONDANSETRON HCL 4 MG PO TABS: 4.0000 mg | ORAL_TABLET | Freq: Four times a day (QID) | ORAL | Status: DC | PRN

## 2019-02-17 MED ORDER — HEPARIN BOLUS VIA INFUSION
4000.0000 [IU] | Freq: Once | INTRAVENOUS | Status: AC
  Administered 2019-02-17: 4000 [IU] via INTRAVENOUS
  Filled 2019-02-17: qty 4000

## 2019-02-17 MED ORDER — HEPARIN (PORCINE) 25000 UT/250ML-% IV SOLN
850.0000 [IU]/h | INTRAVENOUS | Status: DC
  Administered 2019-02-17 – 2019-02-18 (×2): 850 [IU]/h via INTRAVENOUS
  Filled 2019-02-17 (×2): qty 250

## 2019-02-17 MED ORDER — ONDANSETRON HCL 4 MG/2ML IJ SOLN: 4.0000 mg | Freq: Four times a day (QID) | INTRAMUSCULAR | Status: DC | PRN

## 2019-02-17 MED ORDER — METOPROLOL SUCCINATE ER 25 MG PO TB24
12.5000 mg | ORAL_TABLET | Freq: Every day | ORAL | Status: DC
  Administered 2019-02-17 – 2019-02-18 (×2): 12.5 mg via ORAL
  Filled 2019-02-17 (×2): qty 1

## 2019-02-17 MED ORDER — TICAGRELOR 90 MG PO TABS
90.0000 mg | ORAL_TABLET | Freq: Two times a day (BID) | ORAL | Status: DC
  Administered 2019-02-17 – 2019-02-18 (×3): 90 mg via ORAL
  Filled 2019-02-17 (×3): qty 1

## 2019-02-17 NOTE — Progress Notes (Signed)
Repeat troponin is 0.04. Denies chest pain. Dr. Tressia Miners notified. Will continue to monitor.

## 2019-02-17 NOTE — Progress Notes (Signed)
ANTICOAGULATION CONSULT NOTE - Follow Up Consult  Pharmacy Consult for Heparin  Indication: chest pain/ACS  Allergies  Allergen Reactions  . Antihistamines, Chlorpheniramine-Type Other (See Comments)    Reaction: unknown  . Nitroglycerin Other (See Comments)    Reaction: hypotension    Patient Measurements: Height: 5\' 7"  (170.2 cm) Weight: 145 lb (65.8 kg) IBW/kg (Calculated) : 66.1 Heparin Dosing Weight:  65.8 kg   Vital Signs: Temp: 98.7 F (37.1 C) (03/21 1603) Temp Source: Oral (03/21 1603) BP: 113/68 (03/21 1603) Pulse Rate: 76 (03/21 1603)  Labs: Recent Labs    02/17/19 0550 02/17/19 1122 02/17/19 1526  HGB 10.0*  --   --   HCT 31.0*  --   --   PLT 149*  --   --   APTT 28  --   --   LABPROT 13.9  --   --   INR 1.1  --   --   HEPARINUNFRC  --   --  0.51  CREATININE 1.10  --   --   TROPONINI 0.03* 0.04*  --     Estimated Creatinine Clearance: 44.9 mL/min (by C-G formula based on SCr of 1.1 mg/dL).   Medications:  Medications Prior to Admission  Medication Sig Dispense Refill Last Dose  . acetaminophen (TYLENOL) 500 MG tablet Take 1,000 mg by mouth 3 (three) times daily.   02/16/2019 at Unknown time  . acyclovir (ZOVIRAX) 400 MG tablet Take 400 mg by mouth 2 (two) times daily.    02/16/2019 at Unknown time  . aspirin EC 81 MG tablet Take 1 tablet (81 mg total) by mouth daily.   02/16/2019 at Unknown time  . atorvastatin (LIPITOR) 80 MG tablet Take 80 mg by mouth at bedtime.    02/16/2019 at Unknown time  . cholecalciferol (VITAMIN D) 25 MCG (1000 UT) tablet Take 2,000 Units by mouth daily.   02/16/2019 at Unknown time  . lisinopril (PRINIVIL,ZESTRIL) 5 MG tablet Take 2.5 mg by mouth daily.   02/16/2019 at Unknown time  . metoprolol succinate (TOPROL-XL) 25 MG 24 hr tablet Take 12.5 mg by mouth daily.   02/16/2019 at 0800  . Multiple Vitamin (MULTIVITAMIN WITH MINERALS) TABS tablet Take 1 tablet by mouth daily.   02/16/2019 at Unknown time  . oxyCODONE (OXY  IR/ROXICODONE) 5 MG immediate release tablet Take 2.5 mg by mouth every 6 (six) hours as needed for severe pain.   prn at prn  . ticagrelor (BRILINTA) 90 MG TABS tablet Take 90 mg by mouth 2 (two) times daily.   02/16/2019 at 2200  . ALPRAZolam (XANAX) 0.25 MG tablet Take 1 tablet (0.25 mg total) by mouth 2 (two) times daily as needed for anxiety. (Patient not taking: Reported on 02/17/2019) 30 tablet 0 Not Taking at Unknown time  . carvedilol (COREG) 3.125 MG tablet Take 1 tablet (3.125 mg total) by mouth 2 (two) times daily with a meal. (Patient not taking: Reported on 02/17/2019) 30 tablet 0 Not Taking at Unknown time  . dexamethasone (DECADRON) 2 MG tablet Take 10 mg by mouth once a week.    Past Week at Unknown time  . isosorbide mononitrate (IMDUR) 30 MG 24 hr tablet Take 0.5 tablets (15 mg total) by mouth daily. (Patient not taking: Reported on 02/17/2019) 30 tablet 0 Not Taking at Unknown time    Assessment: Pharmacy consulted for heparin drip dosing and monitoring in 85 yo male admitted with ACS.    Goal of Therapy:  Heparin level 0.3-0.7 units/ml  Monitor platelets by anticoagulation protocol: Yes   Plan:  Baseline labs ordered Give 4000 units bolus x 1 Start heparin infusion at 850 units/hr Check anti-Xa level in 8 hours and daily while on heparin Continue to monitor H&H and platelets  3/21:  HL @ 1526 = 0.51 Will continue pt on current rate and recheck HL in 8 hrs on 3/21 @ 2330.   Andrew Soria D 02/17/2019,4:31 PM

## 2019-02-17 NOTE — ED Notes (Signed)
Heparin verified with Helene Kelp RN

## 2019-02-17 NOTE — Consult Note (Signed)
STEMI presentation Called code STEMI from the field arrived at the emergency room Dr. Karma Greaser called me and we reviewed EKGs After examining the patient at the bedside and reviewed EKGs I determined this was probably not a code STEMI and canceled the code STEMI and will continue ER evaluation. Left bundle branch block with A. fib which makes EKG interpretation difficult patient was pain-free by the time I saw him. Advise the daughter call me back if clinical condition changes

## 2019-02-17 NOTE — ED Notes (Signed)
Admitting MD at bedside.

## 2019-02-17 NOTE — ED Notes (Signed)
Dr. callwood at the bedside. 

## 2019-02-17 NOTE — ED Triage Notes (Signed)
Patient presents to Emergency Department via [facility] from HOME with complaints of CP with N/V and SOB   History of MI, CABG, STENT

## 2019-02-17 NOTE — Progress Notes (Signed)
   [place] [facility] Day: 0 days Dwayne Terry is a 85 y.o. male presenting with Chest Pain .   Advance care planning discussed with patient and his family Family at bedside.  His wife and daughter were present, patient is alert and oriented x3 - names his daughter as medical power of attorney. He has had changed living wills in the past. As of now- he wants to be a full code. All questions in regards to overall condition and expected prognosis answered. The decision was made to  continue current code status  CODE STATUS: Full Code Time spent: 18 minutes

## 2019-02-17 NOTE — Consult Note (Signed)
ANTICOAGULATION CONSULT NOTE - Initial Consult  Pharmacy Consult for Heparin Dosing and Monitoring  Indication: chest pain/ACS  Allergies  Allergen Reactions  . Antihistamines, Chlorpheniramine-Type Other (See Comments)    Reaction: unknown  . Nitroglycerin Other (See Comments)    Reaction: hypotension   Patient Measurements: Height: 5\' 7"  (170.2 cm) Weight: 156 lb 3.2 oz (70.9 kg) IBW/kg (Calculated) : 66.1  Vital Signs: Temp: 97.4 F (36.3 C) (03/21 0547) Temp Source: Oral (03/21 0547) BP: 130/71 (03/21 0547) Pulse Rate: 97 (03/21 0547)  Labs: Recent Labs    02/17/19 0550  HGB 10.0*  HCT 31.0*  PLT 149*  APTT 28  LABPROT 13.9  INR 1.1  CREATININE 1.10  TROPONINI 0.03*    Estimated Creatinine Clearance: 45.1 mL/min (by C-G formula based on SCr of 1.1 mg/dL).  Medical History: Past Medical History:  Diagnosis Date  . Cancer ([place])    bone marrow, prostate cancer  . Diabetes mellitus without complication ([place])   . Hyperlipidemia   . MI (mitral incompetence)   . MI (myocardial infarction) [facility])     Assessment: Pharmacy consulted for heparin drip dosing and monitoring in 85 yo male admitted with ACS.    Goal of Therapy:  Heparin level 0.3-0.7 units/ml Monitor platelets by anticoagulation protocol: Yes   Plan:  Baseline labs ordered Give 4000 units bolus x 1 Start heparin infusion at 850 units/hr Check anti-Xa level in 8 hours and daily while on heparin Continue to monitor H&H and platelets   Pernell Dupre, PharmD, BCPS Clinical Pharmacist 02/17/2019 6:58 AM

## 2019-02-17 NOTE — ED Notes (Signed)
Per Dr Clayborn Bigness: no cath lab activation, Dr Karma Greaser not available for cardiologist

## 2019-02-17 NOTE — Progress Notes (Signed)
ANTICOAGULATION CONSULT NOTE - Follow Up Consult  Pharmacy Consult for Heparin  Indication: chest pain/ACS  Allergies  Allergen Reactions  . Antihistamines, Chlorpheniramine-Type Other (See Comments)    Reaction: unknown  . Nitroglycerin Other (See Comments)    Reaction: hypotension    Patient Measurements: Height: 5\' 7"  (170.2 cm) Weight: 145 lb (65.8 kg) IBW/kg (Calculated) : 66.1 Heparin Dosing Weight:  65.8 kg   Vital Signs: Temp: 99 F (37.2 C) (03/21 1950) Temp Source: Oral (03/21 1950) BP: 109/54 (03/21 1950) Pulse Rate: 62 (03/21 1950)  Labs: Recent Labs    02/17/19 0550 02/17/19 1122 02/17/19 1526 02/17/19 1643 02/17/19 2256  HGB 10.0*  --   --   --   --   HCT 31.0*  --   --   --   --   PLT 149*  --   --   --   --   APTT 28  --   --   --   --   LABPROT 13.9  --   --   --   --   INR 1.1  --   --   --   --   HEPARINUNFRC  --   --  0.51  --  0.46  CREATININE 1.10  --   --   --   --   TROPONINI 0.03* 0.04*  --  0.04* 0.04*    Estimated Creatinine Clearance: 44.9 mL/min (by C-G formula based on SCr of 1.1 mg/dL).   Medications:  Medications Prior to Admission  Medication Sig Dispense Refill Last Dose  . acetaminophen (TYLENOL) 500 MG tablet Take 1,000 mg by mouth 3 (three) times daily.   02/16/2019 at Unknown time  . acyclovir (ZOVIRAX) 400 MG tablet Take 400 mg by mouth 2 (two) times daily.    02/16/2019 at Unknown time  . aspirin EC 81 MG tablet Take 1 tablet (81 mg total) by mouth daily.   02/16/2019 at Unknown time  . atorvastatin (LIPITOR) 80 MG tablet Take 80 mg by mouth at bedtime.    02/16/2019 at Unknown time  . cholecalciferol (VITAMIN D) 25 MCG (1000 UT) tablet Take 2,000 Units by mouth daily.   02/16/2019 at Unknown time  . lisinopril (PRINIVIL,ZESTRIL) 5 MG tablet Take 2.5 mg by mouth daily.   02/16/2019 at Unknown time  . metoprolol succinate (TOPROL-XL) 25 MG 24 hr tablet Take 12.5 mg by mouth daily.   02/16/2019 at 0800  . Multiple Vitamin  (MULTIVITAMIN WITH MINERALS) TABS tablet Take 1 tablet by mouth daily.   02/16/2019 at Unknown time  . oxyCODONE (OXY IR/ROXICODONE) 5 MG immediate release tablet Take 2.5 mg by mouth every 6 (six) hours as needed for severe pain.   prn at prn  . ticagrelor (BRILINTA) 90 MG TABS tablet Take 90 mg by mouth 2 (two) times daily.   02/16/2019 at 2200  . ALPRAZolam (XANAX) 0.25 MG tablet Take 1 tablet (0.25 mg total) by mouth 2 (two) times daily as needed for anxiety. (Patient not taking: Reported on 02/17/2019) 30 tablet 0 Not Taking at Unknown time  . carvedilol (COREG) 3.125 MG tablet Take 1 tablet (3.125 mg total) by mouth 2 (two) times daily with a meal. (Patient not taking: Reported on 02/17/2019) 30 tablet 0 Not Taking at Unknown time  . dexamethasone (DECADRON) 2 MG tablet Take 10 mg by mouth once a week.    Past Week at Unknown time  . isosorbide mononitrate (IMDUR) 30 MG 24 hr tablet Take  0.5 tablets (15 mg total) by mouth daily. (Patient not taking: Reported on 02/17/2019) 30 tablet 0 Not Taking at Unknown time    Assessment: Pharmacy consulted for heparin drip dosing and monitoring in 85 yo male admitted with ACS.    3/21 AM Heparin infusion started @ 850 units/hr 3/21 @ 1526 HL: 0.51- Level therapeutic x 1  Goal of Therapy:  Heparin level 0.3-0.7 units/ml Monitor platelets by anticoagulation protocol: Yes   Plan:  3/21 @ 2256 HL: 0.46. Level now therapeutic x2.  Will continue current infusion rate of 850 units/hr and continue to check heparin levels and CBCs with AM labs daily per protocol Continue to monitor H&H and platelets   Pernell Dupre, PharmD, BCPS Clinical Pharmacist 02/17/2019 11:51 PM

## 2019-02-17 NOTE — ED Notes (Signed)
ED Provider at bedside. 

## 2019-02-17 NOTE — ED Provider Notes (Addendum)
[facility] Emergency Department Provider Note  ____________________________________________   First MD Initiated Contact with Patient 02/17/19 [id number]     (approximate)  I have reviewed the triage vital signs and the nursing notes.   HISTORY  Chief Complaint Chest Pain  Level 5 caveat:  history/ROS limited by acute/critical illness  HPI Dwayne Terry is a 85 y.o. male with severe coronary artery disease as well as bone marrow and prostate cancer.  He has a history of prior CABG as well as what he says are 4 coronary stents placed in August 2019.  He presents by EMS with concern for STEMI.  He woke up just prior to her arrival with sharp chest pain as well as pressure, shortness of breath, nausea, and vomiting.  He says it felt similar to prior heart attacks of which she says he has had 20.  He called EMS and when they obtained an EKG they were concerned for STEMI so transmitted it and called me by phone.  My MD colleague and I reviewed the EKG as well as an old EKG and he had changes on the twelve-lead in the field that were concerning for STEMI based on Sgarbossa's criteria and the significant ST segment elevation in V4 as well as the elevation on aVR unchanged from prior.  We recommended that the paramedics go ahead and call a code STEMI to facilitate and expedite the work-up.  The patient arrived in no distress and said that all of his symptoms had completely resolved.  He is somewhat of a vague historian but said that he feels much better.  He has not had any recent respiratory illness and denies fever/chills and dysuria as well as shortness of breath when he was not having chest pain.  Nothing in particular made the symptoms better or worse except that he did receive a full dose aspirin prior to arrival and his pain seemed to get better after that.  His cardiologist is Dr. Saralyn Pilar.        Past Medical History:  Diagnosis Date   Cancer ([place])    bone  marrow, prostate cancer   Diabetes mellitus without complication ([place])    Hyperlipidemia    MI (mitral incompetence)    MI (myocardial infarction) [facility])     Patient Active Problem List   Diagnosis Date Noted   Acute renal failure (ARF) ([place]) 01/16/2018   Pulmonary edema 12/30/2017   HLD (hyperlipidemia) 09/16/2017   Diabetes ([place]) 09/16/2017   Chest pain 04/29/2017   NSTEMI (non-ST elevated myocardial infarction) ([place]) 04/29/2017   Presence of coronary angioplasty implant and graft 03/21/2017   Chest pain, rule out acute myocardial infarction 01/27/2017   Dyspnea on exertion 09/06/2014   Malignant neoplasm of prostate ([place]) 05/09/2014   Atherosclerotic heart disease of native coronary artery without angina pectoris 07/10/2013   Essential (primary) hypertension 07/10/2013    Past Surgical History:  Procedure Laterality Date   AORTIC VALVE REPLACEMENT (AVR)/CORONARY ARTERY BYPASS GRAFTING (CABG)     CORONARY ANGIOPLASTY WITH STENT PLACEMENT     CORONARY BALLOON ANGIOPLASTY N/A 06/10/2017   Procedure: Coronary Balloon Angioplasty;  Surgeon: Wellington Hampshire, MD;  Location: [place] CV LAB;  Service: Cardiovascular;  Laterality: N/A;   CORONARY STENT INTERVENTION N/A 01/28/2017   Procedure: Coronary Stent Intervention;  Surgeon: [name], MD;  Location: [place] CV LAB;  Service: Cardiovascular;  Laterality: N/A;   CORONARY STENT INTERVENTION N/A 04/29/2017   Procedure: Coronary Stent Intervention;  Surgeon:  [name], MD;  Location: [place] CV LAB;  Service: Cardiovascular;  Laterality: N/A;   CORONARY STENT INTERVENTION N/A 09/19/2017   Procedure: CORONARY/GRAFT ANGIOGRAPHY;  Surgeon: Corey Skains, MD;  Location: [place] CV LAB;  Service: Cardiovascular;  Laterality: N/A;   CORONARY STENT INTERVENTION N/A 09/19/2017   Procedure: CORONARY STENT INTERVENTION;  Surgeon: Wellington Hampshire, MD;  Location: [place]  CV LAB;  Service: Cardiovascular;  Laterality: N/A;   LEFT HEART CATH AND CORONARY ANGIOGRAPHY N/A 01/28/2017   Procedure: Left Heart Cath and Coronary Angiography;  Surgeon: [name], MD;  Location: [place] [facility] LAB;  Service: Cardiovascular;  Laterality: N/A;   LEFT HEART CATH AND CORONARY ANGIOGRAPHY N/A 10/31/2017   Procedure: LEFT HEART CATH AND CORONARY ANGIOGRAPHY;  Surgeon: Teodoro Spray, MD;  Location: [place] [facility];  Service: Cardiovascular;  Laterality: N/A;   LEFT HEART CATH AND CORS/GRAFTS ANGIOGRAPHY N/A 04/29/2017   Procedure: Left Heart Cath and Cors/Grafts Angiography;  Surgeon: [name], MD;  Location: [place] CV LAB;  Service: Cardiovascular;  Laterality: N/A;   LEFT HEART CATH AND CORS/GRAFTS ANGIOGRAPHY N/A 06/10/2017   Procedure: Left Heart Cath and Cors/Grafts Angiography;  Surgeon: Corey Skains, MD;  Location: [place] [facility];  Service: Cardiovascular;  Laterality: N/A;   LEFT HEART CATH AND CORS/GRAFTS ANGIOGRAPHY N/A 09/19/2017   Procedure: LEFT HEART CATH;  Surgeon: Corey Skains, MD;  Location: [place] [facility];  Service: Cardiovascular;  Laterality: N/A;   LEFT HEART CATH AND CORS/GRAFTS ANGIOGRAPHY N/A 09/18/2018   Procedure: LEFT HEART CATH AND CORS/GRAFTS ANGIOGRAPHY;  Surgeon: Corey Skains, MD;  Location: [place] [facility];  Service: Cardiovascular;  Laterality: N/A;    Prior to Admission medications   Medication Sig Start Date End Date Taking? Authorizing Provider  acetaminophen (TYLENOL) 500 MG tablet Take 1,000 mg by mouth 3 (three) times daily.   Yes [provider]  acyclovir (ZOVIRAX) 400 MG tablet Take 400 mg by mouth 2 (two) times daily.    Yes [provider]  aspirin EC 81 MG tablet Take 1 tablet (81 mg total) by mouth daily. 01/29/17  Yes Wieting, Richard, MD  atorvastatin (LIPITOR) 80 MG tablet Take 80 mg by mouth at bedtime.  05/18/18  Yes [provider]    cholecalciferol (VITAMIN D) 25 MCG (1000 UT) tablet Take 2,000 Units by mouth daily.   Yes [provider]  lisinopril (PRINIVIL,ZESTRIL) 5 MG tablet Take 2.5 mg by mouth daily.   Yes [provider]  metoprolol succinate (TOPROL-XL) 25 MG 24 hr tablet Take 12.5 mg by mouth daily.   Yes [provider]  Multiple Vitamin (MULTIVITAMIN WITH MINERALS) TABS tablet Take 1 tablet by mouth daily.   Yes [provider]  oxyCODONE (OXY IR/ROXICODONE) 5 MG immediate release tablet Take 2.5 mg by mouth every 6 (six) hours as needed for severe pain.   Yes [provider]  ticagrelor (BRILINTA) 90 MG TABS tablet Take 90 mg by mouth 2 (two) times daily.   Yes [provider]  ALPRAZolam (XANAX) 0.25 MG tablet Take 1 tablet (0.25 mg total) by mouth 2 (two) times daily as needed for anxiety. Patient not taking: Reported on 02/17/2019 01/14/18   Vaughan Basta, MD  carvedilol (COREG) 3.125 MG tablet Take 1 tablet (3.125 mg total) by mouth 2 (two) times daily with a meal. Patient not taking: Reported on 02/17/2019 12/31/17   Epifanio Lesches, MD  dexamethasone (DECADRON) 2  MG tablet Take 10 mg by mouth once a week.     [provider]  isosorbide mononitrate (IMDUR) 30 MG 24 hr tablet Take 0.5 tablets (15 mg total) by mouth daily. Patient not taking: Reported on 02/17/2019 04/08/18   Salary, Holly Bodily D, MD    Allergies Antihistamines, chlorpheniramine-type and Nitroglycerin  Family History  Problem Relation Age of Onset   Heart disease Other    Heart disease Father     Social History Social History   Tobacco Use   Smoking status: Former Smoker    Last attempt to quit: 2015    Years since quitting: 5.2   Smokeless tobacco: Former Systems developer  Substance Use Topics   Alcohol use: No   Drug use: No    Review of Systems Constitutional: No fever/chills Eyes: No visual changes. ENT: No sore throat. Cardiovascular: +chest  pain. Respiratory: +shortness of breath associated with chest pain. Gastrointestinal: No abdominal pain.  Nausea and vomiting once associated with the chest pain.  No diarrhea.  No constipation. Genitourinary: Negative for dysuria. Musculoskeletal: Negative for neck pain.  Negative for back pain. Integumentary: Negative for rash. Neurological: Negative for headaches, focal weakness or numbness.   ____________________________________________   PHYSICAL EXAM:  ED Triage Vitals  Enc Vitals Group     BP 02/17/19 0547 130/71     Pulse Rate 02/17/19 0547 97     Resp 02/17/19 0547 17     Temp 02/17/19 0547 (!) 97.4 F (36.3 C)     Temp Source 02/17/19 0547 Oral     SpO2 02/17/19 0547 98 %     Weight 02/17/19 0548 70.9 kg (156 lb 3.2 oz)     Height 02/17/19 0548 1.702 m (5\' 7" )     Head Circumference --      Peak Flow --      Pain Score 02/17/19 0548 0     Pain Loc --      Pain Edu? --      Excl. in [place]? --     Constitutional: Alert and oriented.  Conversant, no acute distress. Eyes: Conjunctivae are normal.  Head: Atraumatic. Nose: No congestion/rhinnorhea. Mouth/Throat: Mucous membranes are moist. Neck: No stridor.  No meningeal signs.   Cardiovascular: Varied and intermittent tachycardia with irregularly irregular rhythm. Good peripheral circulation. Grossly normal heart sounds. Respiratory: Normal respiratory effort.  No retractions. Lungs CTAB. Gastrointestinal: Soft and nontender. No distention.  Musculoskeletal: No lower extremity tenderness nor edema. No gross deformities of extremities. Neurologic:  Normal speech and language. No gross focal neurologic deficits are appreciated.  Skin:  Skin is warm, dry and intact. No rash noted. Psychiatric: Mood and affect are normal. Speech and behavior are normal.  ____________________________________________   LABS (all labs ordered are listed, but only abnormal results are displayed)  Labs Reviewed  CBC WITH  DIFFERENTIAL/PLATELET - Abnormal; Notable for the following components:      Result Value   RBC 3.58 (*)    Hemoglobin 10.0 (*)    HCT 31.0 (*)    RDW 18.0 (*)    Platelets 149 (*)    All other components within normal limits  COMPREHENSIVE METABOLIC PANEL - Abnormal; Notable for the following components:   Sodium 131 (*)    CO2 19 (*)    Glucose, Bld 131 (*)    Calcium 8.5 (*)    All other components within normal limits  TROPONIN I - Abnormal; Notable for the following components:   Troponin I 0.03 (*)  All other components within normal limits  LIPID PANEL - Abnormal; Notable for the following components:   HDL 34 (*)    All other components within normal limits  PROTIME-INR  APTT  SAMPLE TO BLOOD BANK   ____________________________________________  EKG  ED ECG REPORT I, Hinda Kehr, the attending physician, personally viewed and interpreted this ECG.  Date: 02/17/2019 EKG Time: 5:43 AM Rate: 120 Rhythm: A. fib with RVR QRS Axis: normal Intervals: Left bundle branch block ST/T Wave abnormalities: Non-specific ST segment / T-wave changes, but no clear evidence of acute ischemia. Narrative Interpretation: no definitive evidence of acute ischemia; does not meet STEMI criteria.   ____________________________________________  RADIOLOGY   ED MD interpretation: Questionable peribronchial cuffing and interstitial prominence but no other acute process identified.  Official radiology report(s): Dg Chest Port 1 View  Result Date: 02/17/2019 CLINICAL DATA:  85 year old male with history of chest pain, nausea, vomiting and shortness of breath. Former smoker (quit in 2015). EXAM: PORTABLE CHEST 1 VIEW COMPARISON:  Chest x-ray 09/15/2018. FINDINGS: Diffuse peribronchial cuffing. Widespread interstitial prominence, increased compared to prior examinations. No confluent consolidative airspace disease. No definite pleural effusions. Pulmonary vasculature does not appearing  origin. Heart size appears upper limits of normal. Upper mediastinal contours are within normal limits. Aortic atherosclerosis. Status post median sternotomy for CABG. Transcutaneous defibrillator pads project over the left hemithorax. Implantable loop recorder also noted projecting over the lower left hemithorax. IMPRESSION: 1. Increasing peribronchial cuffing and interstitial prominence throughout the lungs bilaterally. Although some of this may be attributable to the patient's underlying interstitial lung disease, a superimposed acute process is not excluded. Findings may reflect developing pulmonary edema, however, this is not favored to be cardiogenic edema at this time. The possibility of infection should also be considered if clinically appropriate. 2. Aortic atherosclerosis. Electronically Signed   By: Vinnie Langton M.D.   On: 02/17/2019 06:30    ____________________________________________   PROCEDURES   Procedure(s) performed (including Critical Care):  .Critical Care Performed by: Hinda Kehr, MD Authorized by: Hinda Kehr, MD   Critical care provider statement:    Critical care time (minutes):  30   Critical care time was exclusive of:  Separately billable procedures and treating other patients   Critical care was necessary to treat or prevent imminent or life-threatening deterioration of the following conditions:  Cardiac failure   Critical care was time spent personally by me on the following activities:  Development of treatment plan with patient or surrogate, discussions with consultants, evaluation of patient's response to treatment, examination of patient, obtaining history from patient or surrogate, ordering and performing treatments and interventions, ordering and review of laboratory studies, ordering and review of radiographic studies, pulse oximetry, re-evaluation of patient's condition and review of old  charts     ____________________________________________   [place] / MDM / [place] / ED COURSE  As part of my medical decision making, I reviewed the following data within the [place] notes reviewed and incorporated, Labs reviewed , EKG interpreted , Old chart reviewed, Radiograph reviewed , Discussed with admitting physician , A consult was requested and obtained from this/these consultant(s) Cardiology (Dr. Clayborn Bigness came to the ED and personally evaluated the patient and discussed the case with me) and Notes from prior ED visits         Differential diagnosis includes, but is not limited to, ACS, stable angina, infectious process, CHF exacerbation, PE.  The patient is now well-appearing and in no  distress with no symptoms.  Code STEMI was activated given his dynamic EKG changes, the history, and the concern of the paramedic EKG compared to the prior one in the computer system.  However upon arrival in the emergency department he is asymptomatic and has a reassuring EKG as documented above.  Dr. Clayborn Bigness the cardiologist on STEMI call came to the ED and evaluated the patient in person and discontinued the STEMI call and recommended a routine work-up for unstable angina.  I will start a heparin bolus and infusion and discuss the case with the hospitalist.  I am also giving metoprolol 5 mg IV for his slightly elevated and variable A. fib with RVR.  I discussed the case with Dr. Clayborn Bigness who agreed with the plan and I discussed the case with the patient and family who also agree.  Lab work is notable for a very mildly elevated troponin of 0.03 which may be his baseline or could represent an acute ischemic event.  Comprehensive metabolic panel is normal, coags are normal, CBC is normal.  Clinical Course as of Feb 17 743  Sat Feb 17, 2019  0643 Discussed the case in person just now with Dr. Marcille Blanco of the hospitalist service who will pass along the  case to the daytime admitting hospitalist.   [CF]    Clinical Course User Index [CF] Hinda Kehr, MD    ____________________________________________  FINAL CLINICAL IMPRESSION(S) / ED DIAGNOSES  Final diagnoses:  Unstable angina ([place])  Elevated troponin I level  Atrial fibrillation with RVR ([place])     MEDICATIONS GIVEN DURING THIS VISIT:  Medications  0.9 %  sodium chloride infusion ( Intravenous Rate/Dose Verify 02/17/19 0734)  heparin bolus via infusion 4,000 Units (4,000 Units Intravenous Bolus from Bag 02/17/19 0721)    Followed by  heparin ADULT infusion 100 units/mL (25000 units/224mL sodium chloride 0.45%) (850 Units/hr Intravenous Rate/Dose Verify 02/17/19 0734)  metoprolol tartrate (LOPRESSOR) injection 5 mg (5 mg Intravenous Given 02/17/19 2595)     ED Discharge Orders    None       Note:  This document was prepared using Dragon voice recognition software and may include unintentional dictation errors.   Hinda Kehr, MD 02/17/19 [id number]    Hinda Kehr, MD 02/17/19 [phone]

## 2019-02-17 NOTE — ED Notes (Signed)
Attempted to call report, was informed x 2 that RN is in an isolation room. Gave number to a different nurse and awaiting call back.

## 2019-02-17 NOTE — ED Notes (Signed)
CRITICAL LAB: TROPONIN is 0.03, [facility], Dr. Karma Greaser notified, orders received

## 2019-02-17 NOTE — H&P (Signed)
[facility] NAME: Dwayne Terry    MR#:  [id number]  DATE OF BIRTH:  1930/03/27  DATE OF ADMISSION:  02/17/2019  PRIMARY CARE PHYSICIAN: Kirk Ruths, MD   REQUESTING/REFERRING PHYSICIAN: Dr. Hinda Kehr  CHIEF COMPLAINT:   Chief Complaint  Patient presents with   Chest Pain    HISTORY OF PRESENT ILLNESS:  Dwayne Terry  is a 85 y.o. male with a known history of significant for CAD status post CABG, stents latest in 2018, sleep apnea, hypertension, diabetes, recurrent syncope status post implantable loop recorder, history of multiple myeloma currently on chemotherapy presents to hospital from home secondary to sudden onset of chest pain associated with nausea, vomiting and diaphoresis that woke him from sleep. Patient is a poor historian.  Wife and daughter at bedside.  Since his last stents, patient has been doing well.  He does have ischemic cardiomyopathy and has been getting worked up for recurrent syncope and has an implantable loop recorder.  He was doing well up until he went to bed last night.  Denied any chest pain.  Woke up in the middle of the night around 5 AM with sudden onset of heaviness in his chest associated with diaphoresis nauseous and one episode of vomiting.  He is allergic to nitro, but received aspirin with EMS.  By the time he presented to the emergency room he has been pain-free.  EKG showing bundle branch block, initially STEMI was alerted however cardiologist evaluated patient at bedside and looked at the EKG and canceled code STEMI. First troponin is slightly elevated at 0.03.  He is started on heparin drip and being admitted for NSTEMI/unstable angina.Marland Kitchen  PAST MEDICAL HISTORY:   Past Medical History:  Diagnosis Date   CAD (coronary artery disease)    s/p CABg and stents in 2019   Diabetes mellitus without complication (HCC)    Hyperlipidemia    Ischemic cardiomyopathy    last EF 25%    MI (mitral incompetence)    MI (myocardial infarction) (HCC)    Multiple myeloma (HCC)    bone marrow, prostate cancer    PAST SURGICAL HISTORY:   Past Surgical History:  Procedure Laterality Date   AORTIC VALVE REPLACEMENT (AVR)/CORONARY ARTERY BYPASS GRAFTING (CABG)     CORONARY ANGIOPLASTY WITH STENT PLACEMENT     CORONARY BALLOON ANGIOPLASTY N/A 06/10/2017   Procedure: Coronary Balloon Angioplasty;  Surgeon: Wellington Hampshire, MD;  Location: [place] [facility];  Service: Cardiovascular;  Laterality: N/A;   CORONARY STENT INTERVENTION N/A 01/28/2017   Procedure: Coronary Stent Intervention;  Surgeon: [name], MD;  Location: [place] [facility];  Service: Cardiovascular;  Laterality: N/A;   CORONARY STENT INTERVENTION N/A 04/29/2017   Procedure: Coronary Stent Intervention;  Surgeon: [name], MD;  Location: [place] [facility];  Service: Cardiovascular;  Laterality: N/A;   CORONARY STENT INTERVENTION N/A 09/19/2017   Procedure: CORONARY/GRAFT ANGIOGRAPHY;  Surgeon: Corey Skains, MD;  Location: [place] [facility];  Service: Cardiovascular;  Laterality: N/A;   CORONARY STENT INTERVENTION N/A 09/19/2017   Procedure: CORONARY STENT INTERVENTION;  Surgeon: Wellington Hampshire, MD;  Location: [place] [facility];  Service: Cardiovascular;  Laterality: N/A;   LEFT HEART CATH AND CORONARY ANGIOGRAPHY N/A 01/28/2017   Procedure: Left Heart Cath and Coronary Angiography;  Surgeon: [name], MD;  Location: [place] [facility];  Service: Cardiovascular;  Laterality: N/A;   LEFT HEART CATH AND CORONARY  ANGIOGRAPHY N/A 10/31/2017   Procedure: LEFT HEART CATH AND CORONARY ANGIOGRAPHY;  Surgeon: Teodoro Spray, MD;  Location: [place] [facility];  Service: Cardiovascular;  Laterality: N/A;   LEFT HEART CATH AND CORS/GRAFTS ANGIOGRAPHY N/A 04/29/2017   Procedure: Left Heart Cath and Cors/Grafts Angiography;  Surgeon: [name], MD;  Location:  [place] [facility];  Service: Cardiovascular;  Laterality: N/A;   LEFT HEART CATH AND CORS/GRAFTS ANGIOGRAPHY N/A 06/10/2017   Procedure: Left Heart Cath and Cors/Grafts Angiography;  Surgeon: Corey Skains, MD;  Location: [place] [facility];  Service: Cardiovascular;  Laterality: N/A;   LEFT HEART CATH AND CORS/GRAFTS ANGIOGRAPHY N/A 09/19/2017   Procedure: LEFT HEART CATH;  Surgeon: Corey Skains, MD;  Location: [place] [facility];  Service: Cardiovascular;  Laterality: N/A;   LEFT HEART CATH AND CORS/GRAFTS ANGIOGRAPHY N/A 09/18/2018   Procedure: LEFT HEART CATH AND CORS/GRAFTS ANGIOGRAPHY;  Surgeon: Corey Skains, MD;  Location: [place] [facility];  Service: Cardiovascular;  Laterality: N/A;    SOCIAL HISTORY:   Social History   Tobacco Use   Smoking status: Former Smoker    Last attempt to quit: 2015    Years since quitting: 5.2   Smokeless tobacco: Former Network engineer Use Topics   Alcohol use: No    FAMILY HISTORY:   Family History  Problem Relation Age of Onset   Heart disease Other    Heart disease Father     DRUG ALLERGIES:   Allergies  Allergen Reactions   Antihistamines, Chlorpheniramine-Type Other (See Comments)    Reaction: unknown   Nitroglycerin Other (See Comments)    Reaction: hypotension    REVIEW OF SYSTEMS:   Review of Systems  Constitutional: Positive for diaphoresis. Negative for chills, fever, malaise/fatigue and weight loss.  HENT: Negative for ear discharge, ear pain, hearing loss, nosebleeds and tinnitus.   Eyes: Negative for blurred vision, double vision and photophobia.  Respiratory: Negative for cough, hemoptysis, shortness of breath and wheezing.   Cardiovascular: Positive for chest pain. Negative for palpitations, orthopnea and leg swelling.  Gastrointestinal: Positive for nausea and vomiting. Negative for abdominal pain, constipation, diarrhea, heartburn and melena.  Genitourinary: Negative for dysuria,  frequency, hematuria and urgency.  Musculoskeletal: Negative for back pain, myalgias and neck pain.  Skin: Negative for rash.  Neurological: Negative for dizziness, tingling, tremors, sensory change, speech change, focal weakness and headaches.  Endo/Heme/Allergies: Does not bruise/bleed easily.  Psychiatric/Behavioral: Negative for depression.    MEDICATIONS AT HOME:   Prior to Admission medications   Medication Sig Start Date End Date Taking? Authorizing Provider  acetaminophen (TYLENOL) 500 MG tablet Take 1,000 mg by mouth 3 (three) times daily.   Yes [provider]  acyclovir (ZOVIRAX) 400 MG tablet Take 400 mg by mouth 2 (two) times daily.    Yes [provider]  aspirin EC 81 MG tablet Take 1 tablet (81 mg total) by mouth daily. 01/29/17  Yes Wieting, Richard, MD  atorvastatin (LIPITOR) 80 MG tablet Take 80 mg by mouth at bedtime.  05/18/18  Yes [provider]  cholecalciferol (VITAMIN D) 25 MCG (1000 UT) tablet Take 2,000 Units by mouth daily.   Yes [provider]  lisinopril (PRINIVIL,ZESTRIL) 5 MG tablet Take 2.5 mg by mouth daily.   Yes [provider]  metoprolol succinate (TOPROL-XL) 25 MG 24 hr tablet Take 12.5 mg by mouth daily.   Yes [provider]  Multiple Vitamin (MULTIVITAMIN WITH MINERALS) TABS tablet  Take 1 tablet by mouth daily.   Yes [provider]  oxyCODONE (OXY IR/ROXICODONE) 5 MG immediate release tablet Take 2.5 mg by mouth every 6 (six) hours as needed for severe pain.   Yes [provider]  ticagrelor (BRILINTA) 90 MG TABS tablet Take 90 mg by mouth 2 (two) times daily.   Yes [provider]  ALPRAZolam (XANAX) 0.25 MG tablet Take 1 tablet (0.25 mg total) by mouth 2 (two) times daily as needed for anxiety. Patient not taking: Reported on 02/17/2019 01/14/18   Vaughan Basta, MD  carvedilol (COREG) 3.125 MG tablet Take 1 tablet (3.125 mg total) by mouth 2 (two) times daily  with a meal. Patient not taking: Reported on 02/17/2019 12/31/17   Epifanio Lesches, MD  dexamethasone (DECADRON) 2 MG tablet Take 10 mg by mouth once a week.     [provider]  isosorbide mononitrate (IMDUR) 30 MG 24 hr tablet Take 0.5 tablets (15 mg total) by mouth daily. Patient not taking: Reported on 02/17/2019 04/08/18   Salary, Holly Bodily D, MD      VITAL SIGNS:  Blood pressure 132/75, pulse 82, temperature 97.7 F (36.5 C), temperature source Oral, resp. rate 18, height '5\' 7"'$  (1.702 m), weight 65.8 kg, SpO2 100 %.  PHYSICAL EXAMINATION:   Physical Exam  GENERAL:  85 y.o.-year-old patient lying in the bed with no acute distress.  EYES: Pupils equal, round, reactive to light and accommodation. No scleral icterus. Extraocular muscles intact.  HEENT: Head atraumatic, normocephalic. Oropharynx and nasopharynx clear.  NECK:  Supple, no jugular venous distention. No thyroid enlargement, no tenderness.  LUNGS: Normal breath sounds bilaterally, no wheezing, rales,rhonchi or crepitation. No use of accessory muscles of respiration. Decreased bibasilar breath sounds CARDIOVASCULAR: S1, S2 normal. No  rubs, or gallops. 3/6 systolic murmur present ABDOMEN: Soft, nontender, nondistended. Bowel sounds present. No organomegaly or mass.  EXTREMITIES: No pedal edema, cyanosis, or clubbing.  NEUROLOGIC: Cranial nerves II through XII are intact. Muscle strength 5/5 in all extremities. Sensation intact. Gait not checked.  PSYCHIATRIC: The patient is alert and oriented x 3.  SKIN: No obvious rash, lesion, or ulcer.   LABORATORY PANEL:   CBC Recent Labs  Lab 02/17/19 0550  WBC 9.2  HGB 10.0*  HCT 31.0*  PLT 149*   ------------------------------------------------------------------------------------------------------------------  Chemistries  Recent Labs  Lab 02/17/19 0550  NA 131*  K 3.8  CL 102  CO2 19*  GLUCOSE 131*  BUN 13  CREATININE 1.10  CALCIUM 8.5*  AST 19  ALT 16    ALKPHOS 40  BILITOT 0.9   ------------------------------------------------------------------------------------------------------------------  Cardiac Enzymes Recent Labs  Lab 02/17/19 0550  TROPONINI 0.03*   ------------------------------------------------------------------------------------------------------------------  RADIOLOGY:  Dg Chest Port 1 View  Result Date: 02/17/2019 CLINICAL DATA:  85 year old male with history of chest pain, nausea, vomiting and shortness of breath. Former smoker (quit in 2015). EXAM: PORTABLE CHEST 1 VIEW COMPARISON:  Chest x-ray 09/15/2018. FINDINGS: Diffuse peribronchial cuffing. Widespread interstitial prominence, increased compared to prior examinations. No confluent consolidative airspace disease. No definite pleural effusions. Pulmonary vasculature does not appearing origin. Heart size appears upper limits of normal. Upper mediastinal contours are within normal limits. Aortic atherosclerosis. Status post median sternotomy for CABG. Transcutaneous defibrillator pads project over the left hemithorax. Implantable loop recorder also noted projecting over the lower left hemithorax. IMPRESSION: 1. Increasing peribronchial cuffing and interstitial prominence throughout the lungs bilaterally. Although some of this may be attributable to the patient's underlying interstitial lung disease, a  superimposed acute process is not excluded. Findings may reflect developing pulmonary edema, however, this is not favored to be cardiogenic edema at this time. The possibility of infection should also be considered if clinically appropriate. 2. Aortic atherosclerosis. Electronically Signed   By: Vinnie Langton M.D.   On: 02/17/2019 06:30    EKG:   Orders placed or performed during the hospital encounter of 02/17/19   EKG 12-Lead   EKG 12-Lead   ED EKG   ED EKG    IMPRESSION AND PLAN:   85 year old male with past medical history significant for CAD status post  CABG, stents latest in 2019, sleep apnea, hypertension, diabetes, recurrent syncope status post implantable loop recorder, history of multiple myeloma currently on chemotherapy presents to hospital from home secondary to sudden onset of chest pain associated with nausea, vomiting and diaphoresis that woke him from sleep.  1.  Chest pain-unstable angina/non-STEMI -Admit to telemetry, recycle troponins -Started on heparin drip.  High risk patient given CAD status post CABG and prior stents -Cardiology has been consulted. -Continue cardiac medications with aspirin, Brilinta, statin.  2.  Ischemic cardiomyopathy-last known EF of 25 to 30%.  Continue cardiac medications.  Patient on lisinopril and Toprol.  Euvolemic.  Does not need diuretics at this time  3.  Multiple myeloma-follows with oncology at [facility].  Diagnosed almost 2 years ago and has had several rounds of chemotherapy.  Stable according to family.  4.  DVT prophylaxis-currently on heparin drip  Ambulates with a walker at baseline  All the records are reviewed and case discussed with ED provider. Management plans discussed with the patient, family and they are in agreement.  CODE STATUS: Full Code  TOTAL TIME TAKING CARE OF THIS PATIENT: 51 minutes.    Gladstone Lighter M.D on 02/17/2019 at 10:44 AM  Between 7am to 6pm - Pager - [phone]  After 6pm go to [email or web address] - password [email or web address]  Office  [phone]  CC: Primary care physician; Kirk Ruths, MD

## 2019-02-17 NOTE — ED Notes (Addendum)
ED TO INPATIENT HANDOFF REPORT  ED Nurse Name and Phone #:  Anda Kraft [phone]  S Name/Age/Gender Dwayne Terry 85 y.o. male Room/Bed: ED02A/ED02A  Code Status   Code Status: Prior  Home/SNF/Other Home Patient oriented to: self Is this baseline? Yes   Triage Complete: Triage complete  Chief Complaint code stemi  Triage Note Patient presents to Emergency Department via [facility] from HOME with complaints of CP with N/V and SOB   History of MI, CABG, STENT      Allergies Allergies  Allergen Reactions  . Antihistamines, Chlorpheniramine-Type Other (See Comments)    Reaction: unknown  . Nitroglycerin Other (See Comments)    Reaction: hypotension    Level of Care/Admitting Diagnosis ED Disposition    ED Disposition Condition Comment   Admit  Hospital Area: [facility] [[id number]]  Level of Care: Telemetry [5]  Diagnosis: NSTEMI (non-ST elevated myocardial infarction) [facility]) [[id number]]  Admitting Physician: Gladstone Lighter [[id number]]  Attending Physician: Gladstone Lighter [[id number]]  Estimated length of stay: past midnight tomorrow  Certification:: I certify this patient will need inpatient services for at least 2 midnights  PT Class (Do Not Modify): Inpatient [101]  PT Acc Code (Do Not Modify): Private [1]       B Medical/Surgery History Past Medical History:  Diagnosis Date  . CAD (coronary artery disease)    s/p CABg and stents in 2019  . Diabetes mellitus without complication ([place])   . Hyperlipidemia   . Ischemic cardiomyopathy    last EF 25%  . MI (mitral incompetence)   . MI (myocardial infarction) ([place])   . Multiple myeloma (HCC)    bone marrow, prostate cancer   Past Surgical History:  Procedure Laterality Date  . AORTIC VALVE REPLACEMENT (AVR)/CORONARY ARTERY BYPASS GRAFTING (CABG)    . CORONARY ANGIOPLASTY WITH STENT PLACEMENT    . CORONARY BALLOON ANGIOPLASTY N/A 06/10/2017   Procedure: Coronary Balloon Angioplasty;   Surgeon: Wellington Hampshire, MD;  Location: [place] [facility];  Service: Cardiovascular;  Laterality: N/A;  . CORONARY STENT INTERVENTION N/A 01/28/2017   Procedure: Coronary Stent Intervention;  Surgeon: [name], MD;  Location: [place] [facility];  Service: Cardiovascular;  Laterality: N/A;  . CORONARY STENT INTERVENTION N/A 04/29/2017   Procedure: Coronary Stent Intervention;  Surgeon: [name], MD;  Location: [place] [facility];  Service: Cardiovascular;  Laterality: N/A;  . CORONARY STENT INTERVENTION N/A 09/19/2017   Procedure: CORONARY/GRAFT ANGIOGRAPHY;  Surgeon: Corey Skains, MD;  Location: [place] [facility];  Service: Cardiovascular;  Laterality: N/A;  . CORONARY STENT INTERVENTION N/A 09/19/2017   Procedure: CORONARY STENT INTERVENTION;  Surgeon: Wellington Hampshire, MD;  Location: [place] [facility];  Service: Cardiovascular;  Laterality: N/A;  . LEFT HEART CATH AND CORONARY ANGIOGRAPHY N/A 01/28/2017   Procedure: Left Heart Cath and Coronary Angiography;  Surgeon: [name], MD;  Location: [place] [facility];  Service: Cardiovascular;  Laterality: N/A;  . LEFT HEART CATH AND CORONARY ANGIOGRAPHY N/A 10/31/2017   Procedure: LEFT HEART CATH AND CORONARY ANGIOGRAPHY;  Surgeon: Teodoro Spray, MD;  Location: [place] [facility];  Service: Cardiovascular;  Laterality: N/A;  . LEFT HEART CATH AND CORS/GRAFTS ANGIOGRAPHY N/A 04/29/2017   Procedure: Left Heart Cath and Cors/Grafts Angiography;  Surgeon: [name], MD;  Location: [place] [facility];  Service: Cardiovascular;  Laterality: N/A;  . LEFT HEART CATH AND CORS/GRAFTS ANGIOGRAPHY N/A 06/10/2017   Procedure: Left Heart Cath and Cors/Grafts Angiography;  Surgeon: Corey Skains, MD;  Location: [place] [facility];  Service: Cardiovascular;  Laterality: N/A;  . LEFT HEART CATH AND CORS/GRAFTS ANGIOGRAPHY N/A 09/19/2017   Procedure: LEFT HEART CATH;  Surgeon: Corey Skains, MD;   Location: [place] [facility];  Service: Cardiovascular;  Laterality: N/A;  . LEFT HEART CATH AND CORS/GRAFTS ANGIOGRAPHY N/A 09/18/2018   Procedure: LEFT HEART CATH AND CORS/GRAFTS ANGIOGRAPHY;  Surgeon: Corey Skains, MD;  Location: [place] [facility];  Service: Cardiovascular;  Laterality: N/A;     A IV Location/Drains/Wounds Patient Lines/Drains/Airways Status   Active Line/Drains/Airways    Name:   Placement date:   Placement time:   Site:   Days:   Peripheral IV 02/17/19 Right Wrist   02/17/19    0544    Wrist   less than 1   Peripheral IV 02/17/19 Left;Posterior Forearm   02/17/19    0547    Forearm   less than 1          Intake/Output Last 24 hours  Intake/Output Summary (Last 24 hours) at 02/17/2019 0824 Last data filed at 02/17/2019 0734 Gross per 24 hour  Intake 45.46 ml  Output -  Net 45.46 ml    Labs/Imaging Results for orders placed or performed during the hospital encounter of 02/17/19 (from the past 48 hour(s))  CBC with Differential/Platelet     Status: Abnormal   Collection Time: 02/17/19  5:50 AM  Result Value Ref Range   WBC 9.2 4.0 - 10.5 K/uL   RBC 3.58 (L) 4.22 - 5.81 MIL/uL   Hemoglobin 10.0 (L) 13.0 - 17.0 g/dL   HCT 31.0 (L) 39.0 - 52.0 %   MCV 86.6 80.0 - 100.0 fL   MCH 27.9 26.0 - 34.0 pg   MCHC 32.3 30.0 - 36.0 g/dL   RDW 18.0 (H) 11.5 - 15.5 %   Platelets 149 (L) 150 - 400 K/uL   nRBC 0.0 0.0 - 0.2 %   Neutrophils Relative % 75 %   Neutro Abs 6.9 1.7 - 7.7 K/uL   Lymphocytes Relative 10 %   Lymphs Abs 0.9 0.7 - 4.0 K/uL   Monocytes Relative 11 %   Monocytes Absolute 1.0 0.1 - 1.0 K/uL   Eosinophils Relative 2 %   Eosinophils Absolute 0.2 0.0 - 0.5 K/uL   Basophils Relative 1 %   Basophils Absolute 0.1 0.0 - 0.1 K/uL   Immature Granulocytes 1 %   Abs Immature Granulocytes 0.06 0.00 - 0.07 K/uL    Comment: Performed at [facility], [address] [place], [place] [address]  Protime-INR     Status: None   Collection  Time: 02/17/19  5:50 AM  Result Value Ref Range   Prothrombin Time 13.9 11.4 - 15.2 seconds   INR 1.1 0.8 - 1.2    Comment: (NOTE) INR goal varies based on device and disease states. Performed at [facility], [address] [place], [place] [address]   APTT     Status: None   Collection Time: 02/17/19  5:50 AM  Result Value Ref Range   aPTT 28 24 - 36 seconds    Comment: Performed at [facility], [address] [place], [place] [address]  Comprehensive metabolic panel     Status: Abnormal   Collection Time: 02/17/19  5:50 AM  Result Value Ref Range   Sodium 131 (L) 135 - 145 mmol/L   Potassium 3.8 3.5 - 5.1 mmol/L   Chloride  102 98 - 111 mmol/L   CO2 19 (L) 22 - 32 mmol/L   Glucose, Bld 131 (H) 70 - 99 mg/dL   BUN 13 8 - 23 mg/dL   Creatinine, Ser 1.10 0.61 - 1.24 mg/dL   Calcium 8.5 (L) 8.9 - 10.3 mg/dL   Total Protein 6.6 6.5 - 8.1 g/dL   Albumin 3.5 3.5 - 5.0 g/dL   AST 19 15 - 41 U/L   ALT 16 0 - 44 U/L   Alkaline Phosphatase 40 38 - 126 U/L   Total Bilirubin 0.9 0.3 - 1.2 mg/dL   GFR calc non Af Amer >60 >60 mL/min   GFR calc Af Amer >60 >60 mL/min   Anion gap 10 5 - 15    Comment: Performed at [facility], [address] [place], [place] [address]  Troponin I - ONCE - STAT     Status: Abnormal   Collection Time: 02/17/19  5:50 AM  Result Value Ref Range   Troponin I 0.03 (HH) <0.03 ng/mL    Comment: CRITICAL RESULT CALLED TO, READ BACK BY AND VERIFIED WITH NOEL WEBSTER '@0622'  02/17/19 AKT Performed at [facility], [address] [place], [place] [address]   Lipid panel     Status: Abnormal   Collection Time: 02/17/19  5:50 AM  Result Value Ref Range   Cholesterol 96 0 - 200 mg/dL   Triglycerides 101 <150 mg/dL   HDL 34 (L) >40 mg/dL   Total CHOL/HDL Ratio 2.8 RATIO   VLDL 20 0 - 40 mg/dL   LDL Cholesterol 42 0 - 99 mg/dL    Comment:        Total Cholesterol/HDL:CHD Risk Coronary Heart Disease Risk Table                      Men   Women  1/2 Average Risk   3.4   3.3  Average Risk       5.0   4.4  2 X Average Risk   9.6   7.1  3 X Average Risk  23.4   11.0        Use the calculated Patient Ratio above and the CHD Risk Table to determine the patient's CHD Risk.        ATP III CLASSIFICATION (LDL):  <100     mg/dL   Optimal  100-129  mg/dL   Near or Above                    Optimal  130-159  mg/dL   Borderline  160-189  mg/dL   High  >190     mg/dL   Very High Performed at [facility], [address] [place], [place] [address]   Sample to Blood Bank     Status: None   Collection Time: 02/17/19  5:51 AM  Result Value Ref Range   Blood Bank Specimen SAMPLE AVAILABLE FOR TESTING    Sample Expiration      02/20/2019 Performed at [facility], [address] [place], [place] [address]    Dg Chest [place] 1 View  Result Date: 02/17/2019 CLINICAL DATA:  85 year old male with history of chest pain, nausea, vomiting and shortness of breath. Former smoker (quit in 2015). EXAM: PORTABLE CHEST 1 VIEW COMPARISON:  Chest x-ray 09/15/2018. FINDINGS: Diffuse peribronchial cuffing. Widespread interstitial prominence, increased compared to prior examinations. No confluent consolidative airspace disease. No definite pleural effusions. Pulmonary vasculature does  not appearing origin. Heart size appears upper limits of normal. Upper mediastinal contours are within normal limits. Aortic atherosclerosis. Status post median sternotomy for CABG. Transcutaneous defibrillator pads project over the left hemithorax. Implantable loop recorder also noted projecting over the lower left hemithorax. IMPRESSION: 1. Increasing peribronchial cuffing and interstitial prominence throughout the lungs bilaterally. Although some of this may be attributable to the patient's underlying interstitial lung disease, a superimposed acute process is not excluded. Findings may reflect developing pulmonary edema, however, this is  not favored to be cardiogenic edema at this time. The possibility of infection should also be considered if clinically appropriate. 2. Aortic atherosclerosis. Electronically Signed   By: Vinnie Langton M.D.   On: 02/17/2019 06:30    Pending Labs [employer] (From admission, onward)    Start     Ordered   Signed and Occupational hygienist morning,   R     Signed and Held   Signed and Held  CBC  Tomorrow morning,   R     Signed and Held   Signed and Held  Hemoglobin A1c  Once,   R     Signed and Held   Signed and Held  Lipid panel  Add-on,   R     Signed and Held          Vitals/Pain Today's Vitals   02/17/19 0630 02/17/19 0700 02/17/19 0730 02/17/19 0800  BP: 91/62 (!) 102/58 (!) 93/59 105/65  Pulse: (!) 51 (!) 47 84 (!) 128  Resp: (!) 23 (!) 9 14 (!) 23  Temp:      TempSrc:      SpO2: 100% 98% 99% 99%  Weight:      Height:      PainSc:        Isolation Precautions No active isolations  Medications Medications  0.9 %  sodium chloride infusion ( Intravenous Rate/Dose Verify 02/17/19 0734)  heparin bolus via infusion 4,000 Units (4,000 Units Intravenous Bolus from Bag 02/17/19 0721)    Followed by  heparin ADULT infusion 100 units/mL (25000 units/241m sodium chloride 0.45%) (850 Units/hr Intravenous Rate/Dose Verify 02/17/19 0734)  metoprolol tartrate (LOPRESSOR) injection 5 mg (5 mg Intravenous Given 02/17/19 0724)    Mobility walks with person assist High fall risk   Focused Assessments   R Recommendations: See Admitting Provider Note  Report given to: TLovena LeRN 2A  Additional Notes:

## 2019-02-18 DIAGNOSIS — I200 Unstable angina: Secondary | ICD-10-CM | POA: Diagnosis present

## 2019-02-18 DIAGNOSIS — I4891 Unspecified atrial fibrillation: Secondary | ICD-10-CM

## 2019-02-18 LAB — CBC
HCT: 28.4 % — ABNORMAL LOW (ref 39.0–52.0)
HEMOGLOBIN: 9.2 g/dL — AB (ref 13.0–17.0)
MCH: 28.1 pg (ref 26.0–34.0)
MCHC: 32.4 g/dL (ref 30.0–36.0)
MCV: 86.9 fL (ref 80.0–100.0)
Platelets: 136 10*3/uL — ABNORMAL LOW (ref 150–400)
RBC: 3.27 MIL/uL — ABNORMAL LOW (ref 4.22–5.81)
RDW: 18.1 % — AB (ref 11.5–15.5)
WBC: 9.5 10*3/uL (ref 4.0–10.5)
nRBC: 0 % (ref 0.0–0.2)

## 2019-02-18 LAB — HEPARIN LEVEL (UNFRACTIONATED): Heparin Unfractionated: 0.44 IU/mL (ref 0.30–0.70)

## 2019-02-18 LAB — BASIC METABOLIC PANEL
Anion gap: 7 (ref 5–15)
BUN: 16 mg/dL (ref 8–23)
CO2: 21 mmol/L — ABNORMAL LOW (ref 22–32)
Calcium: 8.9 mg/dL (ref 8.9–10.3)
Chloride: 107 mmol/L (ref 98–111)
Creatinine, Ser: 0.91 mg/dL (ref 0.61–1.24)
GFR calc Af Amer: >60 mL/min (ref >60)
GFR calc non Af Amer: >60 mL/min (ref >60)
Glucose, Bld: 105 mg/dL — ABNORMAL HIGH (ref 70–99)
Potassium: 3.8 mmol/L (ref 3.5–5.1)
Sodium: 135 mmol/L (ref 135–145)

## 2019-02-18 NOTE — Progress Notes (Signed)
ANTICOAGULATION CONSULT NOTE - Follow Up Consult  Pharmacy Consult for Heparin  Indication: chest pain/ACS  Allergies  Allergen Reactions  . Antihistamines, Chlorpheniramine-Type Other (See Comments)    Reaction: unknown  . Nitroglycerin Other (See Comments)    Reaction: hypotension    Patient Measurements: Height: 5\' 7"  (170.2 cm) Weight: 145 lb (65.8 kg) IBW/kg (Calculated) : 66.1 Heparin Dosing Weight:  65.8 kg   Vital Signs: Temp: 98.6 F (37 C) (03/22 0436) Temp Source: Oral (03/22 0436) BP: 125/65 (03/22 0436) Pulse Rate: 83 (03/22 0436)  Labs: Recent Labs    02/17/19 0550 02/17/19 1122 02/17/19 1526 02/17/19 1643 02/17/19 2256 02/18/19 0433  HGB 10.0*  --   --   --   --  9.2*  HCT 31.0*  --   --   --   --  28.4*  PLT 149*  --   --   --   --  136*  APTT 28  --   --   --   --   --   LABPROT 13.9  --   --   --   --   --   INR 1.1  --   --   --   --   --   HEPARINUNFRC  --   --  0.51  --  0.46 0.44  CREATININE 1.10  --   --   --   --  0.91  TROPONINI 0.03* 0.04*  --  0.04* 0.04*  --     Estimated Creatinine Clearance: 54.2 mL/min (by C-G formula based on SCr of 0.91 mg/dL).   Medications:  Medications Prior to Admission  Medication Sig Dispense Refill Last Dose  . acetaminophen (TYLENOL) 500 MG tablet Take 1,000 mg by mouth 3 (three) times daily.   02/16/2019 at Unknown time  . acyclovir (ZOVIRAX) 400 MG tablet Take 400 mg by mouth 2 (two) times daily.    02/16/2019 at Unknown time  . aspirin EC 81 MG tablet Take 1 tablet (81 mg total) by mouth daily.   02/16/2019 at Unknown time  . atorvastatin (LIPITOR) 80 MG tablet Take 80 mg by mouth at bedtime.    02/16/2019 at Unknown time  . cholecalciferol (VITAMIN D) 25 MCG (1000 UT) tablet Take 2,000 Units by mouth daily.   02/16/2019 at Unknown time  . lisinopril (PRINIVIL,ZESTRIL) 5 MG tablet Take 2.5 mg by mouth daily.   02/16/2019 at Unknown time  . metoprolol succinate (TOPROL-XL) 25 MG 24 hr tablet Take 12.5 mg  by mouth daily.   02/16/2019 at 0800  . Multiple Vitamin (MULTIVITAMIN WITH MINERALS) TABS tablet Take 1 tablet by mouth daily.   02/16/2019 at Unknown time  . oxyCODONE (OXY IR/ROXICODONE) 5 MG immediate release tablet Take 2.5 mg by mouth every 6 (six) hours as needed for severe pain.   prn at prn  . ticagrelor (BRILINTA) 90 MG TABS tablet Take 90 mg by mouth 2 (two) times daily.   02/16/2019 at 2200  . ALPRAZolam (XANAX) 0.25 MG tablet Take 1 tablet (0.25 mg total) by mouth 2 (two) times daily as needed for anxiety. (Patient not taking: Reported on 02/17/2019) 30 tablet 0 Not Taking at Unknown time  . carvedilol (COREG) 3.125 MG tablet Take 1 tablet (3.125 mg total) by mouth 2 (two) times daily with a meal. (Patient not taking: Reported on 02/17/2019) 30 tablet 0 Not Taking at Unknown time  . dexamethasone (DECADRON) 2 MG tablet Take 10 mg by mouth once a week.  Past Week at Unknown time  . isosorbide mononitrate (IMDUR) 30 MG 24 hr tablet Take 0.5 tablets (15 mg total) by mouth daily. (Patient not taking: Reported on 02/17/2019) 30 tablet 0 Not Taking at Unknown time    Assessment: Pharmacy consulted for heparin drip dosing and monitoring in 85 yo male admitted with ACS.    3/21 AM Heparin infusion started @ 850 units/hr 3/21 @ 1526 HL: 0.51- Level therapeutic x 1 3/21 @ 2256 HL: 0.46. Level  therapeutic x2.   Goal of Therapy:  Heparin level 0.3-0.7 units/ml Monitor platelets by anticoagulation protocol: Yes   Plan:  3/22 @ 0433 HL: 0.44. Level remains therapeutic.  Will continue current infusion rate of 850 units/hr and continue to check heparin levels and CBCs with AM labs daily per protocol. Continue to monitor H&H and platelets   Pernell Dupre, PharmD, BCPS Clinical Pharmacist 02/18/2019 5:16 AM

## 2019-02-18 NOTE — TOC Transition Note (Addendum)
Transition of Care [facility]) - CM/SW Discharge Note   Patient Details  Name: Dwayne Terry MRN: [id number] Date of Birth: 1930/02/13  Transition of Care [facility]) CM/SW Contact:  Latanya Maudlin, RN Phone Number: 02/18/2019, 11:54 AM   Clinical Narrative:  Patient to be discharged per MD order. Orders in place for home health services. Patient is active with [facility] home health. Orders in place for resumption of care. Notified Malachy Mood of pending discharge. No DME needs. Outpatient palliative referral faxed to [facility]. Notified daugher, Pam who is HCPOA notified of discharge      Final next level of care: Home w Home Health Services Barriers to Discharge: No Barriers Identified   Patient Goals and CMS Choice   CMS Medicare.gov Compare Post Acute Care list provided to:: Patient Choice offered to / list presented to : Patient  Discharge Placement                       Discharge Plan and Services     Post Acute Care Choice: Home Health, Resumption of Svcs/PTA Provider              HH Arranged: RN, PT [facility] Agency: [place]   Social Determinants of Health (SDOH) Interventions     Readmission Risk Interventions No flowsheet data found.

## 2019-02-18 NOTE — Discharge Summary (Signed)
[facility] [place] NAME: Dwayne Terry    MR#:  [id number]  DATE OF BIRTH:  June 22, 1930  DATE OF ADMISSION:  02/17/2019   ADMITTING PHYSICIAN: Gladstone Lighter, MD  DATE OF DISCHARGE: 02/18/19  PRIMARY CARE PHYSICIAN: Kirk Ruths, MD   ADMISSION DIAGNOSIS:   Unstable angina ([place]) [I20.0] Elevated troponin I level [R79.89] Atrial fibrillation with RVR ([place]) [I48.91]  DISCHARGE DIAGNOSIS:   Principal Problem:   Unstable angina (HCC) Active Problems:   Atrial fibrillation with RVR ([place])   SECONDARY DIAGNOSIS:   Past Medical History:  Diagnosis Date  . CAD (coronary artery disease)    s/p CABg and stents in 2019  . Diabetes mellitus without complication ([place])   . Hyperlipidemia   . Ischemic cardiomyopathy    last EF 25%  . MI (mitral incompetence)   . MI (myocardial infarction) ([place])   . Multiple myeloma (HCC)    bone marrow, prostate cancer    HOSPITAL COURSE:   85 year old male with past medical history significant for CAD status post CABG, stents latest in 2019, sleep apnea, hypertension, diabetes, recurrent syncope status post implantable loop recorder, history of multiple myeloma currently on chemotherapy presents to hospital from home secondary to sudden onset of chest pain associated with nausea, vomiting and diaphoresis that woke him from sleep.  1.  Chest pain-unstable angina -Known history of significant CAD with prior CABG and PCI in 2018. -Significant ischemic cardiomyopathy.  Since first troponin was slightly elevated at 0.03, patient was started on heparin drip. -Symptoms have resolved in the hospital.  [place] cardiology consult. -No further elevation of troponins noted.  They have plateaued at 0.04 and patient denied any further chest pain.  No further interventions at this time.  He is being discharged home on his cardiac medications -Continue cardiac medications with aspirin, Brilinta,  statin.  2.  Atrial fibrillation while in the hospital.-Prior history of recurrent syncope and has an implantable loop recorder in place.  Noted to have paroxysmal A. fib with bundle branch block in the hospital.  Rate controlled.  On low-dose metoprolol. -Discussed with cardiology, recommended anticoagulation at discharge.  Extensive discussion with patient, and his daughter who is the healthcare power of attorney.  They would like to discuss it with patient's outpatient cardiologist.  Also daughter mentions that patient has had several falls and early dementia.  Patient mentioned that he does not want to be on anticoagulation at this time. - continue only aspirin at this time  3.  Ischemic cardiomyopathy-last known EF of 25 to 30%.  Continue cardiac medications.  Patient on lisinopril and Toprol.  Euvolemic.  Does not need diuretics at this time -Would be an ideal candidate for ICD.  However it seems patient has refused it in the past  4.  Multiple myeloma-follows with oncology at [facility].  Diagnosed almost 2 years ago and has had several rounds of chemotherapy.  Stable according to family.  5.  Severe peripheral neuropathy-side effect from his chemotherapy for multiple myeloma.  Patient takes low-dose oxycodone at home   Ambulates with a walker at baseline. Discharge with home health services.  Outpatient palliative care referral   DISCHARGE CONDITIONS:   Guarded  CONSULTS OBTAINED:   Treatment Team:  Yolonda Kida, MD  DRUG ALLERGIES:   Allergies  Allergen Reactions  . Antihistamines, Chlorpheniramine-Type Other (See Comments)    Reaction: unknown  . Nitroglycerin Other (See Comments)    Reaction: hypotension  DISCHARGE MEDICATIONS:   Allergies as of 02/18/2019      Reactions   Antihistamines, Chlorpheniramine-type Other (See Comments)   Reaction: unknown   Nitroglycerin Other (See Comments)   Reaction: hypotension      Medication List    STOP taking  these medications   ALPRAZolam 0.25 MG tablet Commonly known as:  XANAX   carvedilol 3.125 MG tablet Commonly known as:  COREG   dexamethasone 2 MG tablet Commonly known as:  DECADRON   isosorbide mononitrate 30 MG 24 hr tablet Commonly known as:  IMDUR     TAKE these medications   acetaminophen 500 MG tablet Commonly known as:  TYLENOL Take 1,000 mg by mouth 3 (three) times daily.   acyclovir 400 MG tablet Commonly known as:  ZOVIRAX Take 400 mg by mouth 2 (two) times daily.   aspirin EC 81 MG tablet Take 1 tablet (81 mg total) by mouth daily.   atorvastatin 80 MG tablet Commonly known as:  LIPITOR Take 80 mg by mouth at bedtime.   cholecalciferol 25 MCG (1000 UT) tablet Commonly known as:  VITAMIN D Take 2,000 Units by mouth daily.   lisinopril 5 MG tablet Commonly known as:  PRINIVIL,ZESTRIL Take 2.5 mg by mouth daily.   metoprolol succinate 25 MG 24 hr tablet Commonly known as:  TOPROL-XL Take 12.5 mg by mouth daily.   multivitamin with minerals Tabs tablet Take 1 tablet by mouth daily.   oxyCODONE 5 MG immediate release tablet Commonly known as:  Oxy IR/ROXICODONE Take 2.5 mg by mouth every 6 (six) hours as needed for severe pain.   ticagrelor 90 MG Tabs tablet Commonly known as:  BRILINTA Take 90 mg by mouth 2 (two) times daily.        DISCHARGE INSTRUCTIONS:   1. PCP f/u in 1-2 weeks 2. Cardiology f/u in 2 weeks 3. Palliative care referral as outpatient  DIET:   Cardiac diet  ACTIVITY:   Activity as tolerated  OXYGEN:   Home Oxygen: No.  Oxygen Delivery: room air  DISCHARGE LOCATION:   home   If you experience worsening of your admission symptoms, develop shortness of breath, life threatening emergency, suicidal or homicidal thoughts you must seek medical attention immediately by calling 911 or calling your MD immediately  if symptoms less severe.  You Must read complete instructions/literature along with all the possible  adverse reactions/side effects for all the Medicines you take and that have been prescribed to you. Take any new Medicines after you have completely understood and accpet all the possible adverse reactions/side effects.   Please note  You were cared for by a hospitalist during your hospital stay. If you have any questions about your discharge medications or the care you received while you were in the hospital after you are discharged, you can call the unit and asked to speak with the hospitalist on call if the hospitalist that took care of you is not available. Once you are discharged, your primary care physician will handle any further medical issues. Please note that NO REFILLS for any discharge medications will be authorized once you are discharged, as it is imperative that you return to your primary care physician (or establish a relationship with a primary care physician if you do not have one) for your aftercare needs so that they can reassess your need for medications and monitor your lab values.    On the day of Discharge:  VITAL SIGNS:   Blood pressure 125/74, pulse 89,  temperature 98.6 F (37 C), temperature source Oral, resp. rate 19, height '5\' 7"'$  (1.702 m), weight 65.8 kg, SpO2 100 %.  PHYSICAL EXAMINATION:    GENERAL:  85 y.o.-year-old patient lying in the bed with no acute distress.  EYES: Pupils equal, round, reactive to light and accommodation. No scleral icterus. Extraocular muscles intact.  HEENT: Head atraumatic, normocephalic. Oropharynx and nasopharynx clear.  NECK:  Supple, no jugular venous distention. No thyroid enlargement, no tenderness.  LUNGS: Normal breath sounds bilaterally, no wheezing, rales,rhonchi or crepitation. No use of accessory muscles of respiration. Decreased bibasilar breath sounds CARDIOVASCULAR: S1, S2 normal. No  rubs, or gallops. 3/6 systolic murmur present ABDOMEN: Soft, nontender, nondistended. Bowel sounds present. No organomegaly or mass.   EXTREMITIES: No pedal edema, cyanosis, or clubbing.  NEUROLOGIC: Cranial nerves II through XII are intact. Muscle strength 5/5 in all extremities. Sensation intact. Gait not checked.  PSYCHIATRIC: The patient is alert and oriented x 3. Mild cognitive deficits noted. SKIN: No obvious rash, lesion, or ulcer.   DATA REVIEW:   CBC Recent Labs  Lab 02/18/19 0433  WBC 9.5  HGB 9.2*  HCT 28.4*  PLT 136*    Chemistries  Recent Labs  Lab 02/17/19 0550 02/18/19 0433  NA 131* 135  K 3.8 3.8  CL 102 107  CO2 19* 21*  GLUCOSE 131* 105*  BUN 13 16  CREATININE 1.10 0.91  CALCIUM 8.5* 8.9  AST 19  --   ALT 16  --   ALKPHOS 40  --   BILITOT 0.9  --      Microbiology Results  No results found for this or any previous visit.  RADIOLOGY:  No results found.   Management plans discussed with the patient, family and they are in agreement.  CODE STATUS:     Code Status Orders  (From admission, onward)         Start     Ordered   02/17/19 0919  Full code  Continuous     02/17/19 0918        Code Status History    Date Active Date Inactive Code Status Order ID Comments User Context   09/16/2018 0459 09/19/2018 1450 DNR [id number]  Harrie Foreman, MD ED   08/19/2018 1555 08/20/2018 1418 DNR [id number]  Nicholes Mango, MD Inpatient   08/19/2018 1512 08/19/2018 1555 DNR [id number]  Nicholes Mango, MD ED   04/07/2018 1620 04/08/2018 1645 Full Code [id number]  Saundra Shelling, MD ED   01/16/2018 1352 01/18/2018 1914 Full Code [id number]  Epifanio Lesches, MD ED   01/14/2018 0335 01/14/2018 1754 Full Code [id number]  Salary, Avel Peace, MD Inpatient   12/30/2017 0220 12/31/2017 1703 Full Code [id number]  Amelia Jo, MD Inpatient   10/29/2017 0228 10/31/2017 1900 Full Code [id number]  Lance Coon, MD ED   09/17/2017 0058 09/20/2017 1524 Full Code [id number]  Lance Coon, MD Inpatient   06/10/2017 0324 06/11/2017 1613 Full Code [id number]  Harrie Foreman, MD Inpatient   04/29/2017 0512 04/30/2017  1437 Full Code [id number]  Harrie Foreman, MD Inpatient   01/27/2017 2356 01/29/2017 1430 Full Code [id number]  Hugelmeyer, Ubaldo Glassing, DO Inpatient    Advance Directive Documentation     Most Recent Value  Type of Advance Directive  Healthcare Power of Attorney  Pre-existing out of facility DNR order (yellow form or pink MOST form)  -  "MOST" Form in Place?  -      TOTAL TIME TAKING CARE OF THIS PATIENT: 82  minutes.    Gladstone Lighter M.D on 02/18/2019 at 11:59 AM  Between 7am to 6pm - Pager - [phone]  After 6pm go to [email or web address] - Technical brewer [facility]  Office  [phone]  CC: Primary care physician; Kirk Ruths, MD   Note: This dictation was prepared with Dragon dictation along with smaller phrase technology. Any transcriptional errors that result from this process are unintentional.

## 2019-02-18 NOTE — Discharge Instructions (Signed)
Angina ° °Angina is extreme discomfort in the chest, neck, arm, jaw or back. The discomfort is caused by a lack of blood in the middle layer of the heart wall (myocardium). °There are four types of angina: °· Stable angina. This is triggered by vigorous activity or exercise. It goes away when you rest or take angina medicine. °· Unstable angina. This is a warning sign and can lead to a heart attack (acute coronary syndrome). This is a medical emergency. Symptoms come at rest and last a long time. °· Microvascular angina. This affects the small coronary arteries. Symptoms include feeling tired and being short of breath. °· Prinzmetal or variant angina. This is caused by a tightening (spasm) of the arteries that go to your heart. °What are the causes? °This condition is caused by atherosclerosis. This is the buildup of fat and cholesterol (plaque) in your arteries. The plaque may narrow or block the artery. °Other causes include: °· Sudden tightening of the muscles of the arteries in the heart (coronary spasm). °· Small artery disease (microvascular dysfunction). °· Problems with any of your heart valves (heart valve disease). °· A tear in an artery in your heart (coronary artery dissection). °· Cardiomyopathy, or other heart disease. °What increases the risk? °You are more likely to develop this condition if you have: °· High cholesterol. °· High blood pressure. °· Diabetes. °· Family history of heart disease. °· Inactive (sedentary) lifestyle, or you do not exercise enough. °· Depression. °· Had radiation to the left side of your chest. °Other risk factors include: °· Using tobacco. °· Being obese. °· Eating a diet high in saturated fats. °· Being exposed to high stress or triggers of stress. °· Using drugs, such as cocaine. °Women have a greater risk for angina if: °· They are older than 55. °· They have gone through menopause (postmenopausal). °What are the signs or symptoms? °Common symptoms in both men and women  may include: °· Chest pain, which may: °? Feel like a crushing or squeezing in the chest, or a tightness, pressure, fullness, or heaviness in the chest. °? Last for more than a few minutes at a time, or it may stop and come back (recur) over the course of a few minutes. °· Pain in the neck, arm, jaw, or back. °· Unexplained heartburn or indigestion. °· Shortness of breath. °· Nausea. °· Sudden cold sweats. °Women and people with diabetes may have unusual (atypical) symptoms, such as: °· Fatigue. °· Unexplained feelings of nervousness or anxiety. °· Unexplained weakness. °· Dizziness or fainting. °How is this diagnosed? °This condition may be diagnosed based on: °· Your symptoms and medical history. °· Electrocardiogram (ECG) to measure the electrical activity in your heart. °· Blood tests. °· Stress test to look for signs of blockage when your heart is stressed. °· CT angiogram to examine your heart and the blood flow to it. °· Coronary angiogram to check your coronary arteries for blockage. °How is this treated? °Angina may be treated with: °· Medicines to: °? Prevent blood clots and heart attack. °? Relax blood vessels and improve blood flow to the heart (nitrates). °? Reduce blood pressure, improve the pumping action of the heart, and relax blood vessels that are spasming. °? Reduce cholesterol and help treat atherosclerosis. °· A procedure to widen a narrowed or blocked coronary artery (angioplasty). A mesh tube may be placed in a coronary artery to keep it open (coronary stenting). °· Surgery to allow blood to go around a blocked artery (  coronary artery bypass surgery). °Follow these instructions at home: °Medicines °· Take over-the-counter and prescription medicines only as told by your health care provider. °· Do not take the following medicines unless your health care provider approves: °? NSAIDs, such as ibuprofen, naproxen, or celecoxib. °? Vitamin supplements that contain vitamin A, vitamin E, or  both. °? Hormone replacement therapy that contains estrogen with or without progestin. °Eating and drinking ° °· Eat a heart-healthy diet. This includes plenty of fresh fruits and vegetables, whole grains, low-fat (lean) protein, and low-fat dairy products. °· Follow instructions from your health care provider about eating or drinking restrictions. °Activity °· Follow an exercise program approved by your health care provider. Join a cardiac rehabilitation program. °· Take a break when you feel fatigued. Plan rest periods in your daily activities. °Lifestyle ° °· Do not use any products that contain nicotine or tobacco, such as cigarettes and e-cigarettes. If you need help quitting, ask your health care provider. °· If your health care provider approves, limit alcohol intake to no more than 1 drink a day for women and 2 drinks a day for men. One drink equals 12 oz of beer, 5 oz of wine, or 1½ oz of hard liquor. °General instructions °· Maintain a healthy weight. °· Learn to manage stress. °· Keep your vaccinations up to date. Get the flu (influenza) vaccine every year. °· Talk to your health care provider if you feel depressed. Take a depression screening test to see if you are at risk for depression. °· Work with your health care provider to manage other health conditions, such as hypertension or diabetes. °· Keep all follow-up visits as told by your health care provider. This is important. °Get help right away if: °· You have pain in your chest, neck, arm, jaw, or back, and the pain: °? Lasts more than a few minutes. °? Is recurring. °? Is not relieved by taking medicines under the tongue (sublingual nitroglycerin). °? Increases in intensity or frequency. °· You have a lot of sweating without cause. °· You have unexplained: °? Heartburn or indigestion. °? Shortness of breath or difficulty breathing. °? Nausea or vomiting. °? Fatigue. °? Feelings of nervousness or anxiety. °? Weakness. °· You have sudden  light-headedness or dizziness. °· You faint. °These symptoms may represent a serious problem that is an emergency. Do not wait to see if the symptoms will go away. Get medical help right away. Call your local emergency services (911 in the [place] Do not drive yourself to the hospital. °Summary °· Angina is extreme discomfort in the chest, neck, or arm that is caused by a lack of blood in the heart wall. °· There are many symptoms of angina. They include chest pain or pain in the arms, neck, jaw, or back. °· Angina may be treated with behavioral changes, medicine, or surgery. °· Symptoms of angina may represent an emergency. Get medical help right away. Call your local emergency services (911 in the [place] Do not drive yourself to the hospital. °This information is not intended to replace advice given to you by your health care provider. Make sure you discuss any questions you have with your health care provider. °Document Released: 11/15/2005 Document Revised: 12/30/2017 Document Reviewed: 12/30/2017 °Elsevier Interactive Patient Education © 2019 [employer] ° °

## 2019-02-21 NOTE — Consult Note (Signed)
Reason for Consult: Chest pain abnormal EKG known coronary disease Referring Physician: Frazier Richards primary Dr. Les Pou ER physician   Dwayne Terry is an 85 y.o. male.  HPI: History of coronary disease status post coronary bypass surgery presented to the emergency room after having chest pain he was sent for further assessment it was thought that he may be having a STEMI but after evaluation of EKG it was determined that this was not a STEMI.  Patient was subsequently admitted for evaluation of possible angina coronary disease.  Patient has a history cardiomyopathy congestive heart failure AICD pacemaker in place ejection fraction around 25% he has history of diabetes hyperlipidemia multiple myeloma complains of generalized weakness now here for further cardiac assessment  Past Medical History:  Diagnosis Date  . CAD (coronary artery disease)    s/p CABg and stents in 2019  . Diabetes mellitus without complication ([place])   . Hyperlipidemia   . Ischemic cardiomyopathy    last EF 25%  . MI (mitral incompetence)   . MI (myocardial infarction) ([place])   . Multiple myeloma (H[facility])    bone marrow, prostate cancer    Past Surgical History:  Procedure Laterality Date  . AORTIC VALVE REPLACEMENT (AVR)/CORONARY ARTERY BYPASS GRAFTING (CABG)    . CORONARY ANGIOPLASTY WITH STENT PLACEMENT    . CORONARY BALLOON ANGIOPLASTY N/A 06/10/2017   Procedure: Coronary Balloon Angioplasty;  Surgeon: Wellington Hampshire, MD;  Location: [facility];  Service: Cardiovascular;  Laterality: N/A;  . CORONARY STENT INTERVENTION N/A 01/28/2017   Procedure: Coronary Stent Intervention;  Surgeon: [name], MD;  Location: [place] [facility];  Service: Cardiovascular;  Laterality: N/A;  . CORONARY STENT INTERVENTION N/A 04/29/2017   Procedure: Coronary Stent Intervention;  Surgeon: [name], MD;  Location: [place] [facility];  Service: Cardiovascular;  Laterality: N/A;  . CORONARY  STENT INTERVENTION N/A 09/19/2017   Procedure: CORONARY/GRAFT ANGIOGRAPHY;  Surgeon: Corey Skains, MD;  Location: [facility];  Service: Cardiovascular;  Laterality: N/A;  . CORONARY STENT INTERVENTION N/A 09/19/2017   Procedure: CORONARY STENT INTERVENTION;  Surgeon: Wellington Hampshire, MD;  Location: [facility];  Service: Cardiovascular;  Laterality: N/A;  . LEFT HEART CATH AND CORONARY ANGIOGRAPHY N/A 01/28/2017   Procedure: Left Heart Cath and Coronary Angiography;  Surgeon: [name], MD;  Location: [place] [facility];  Service: Cardiovascular;  Laterality: N/A;  . LEFT HEART CATH AND CORONARY ANGIOGRAPHY N/A 10/31/2017   Procedure: LEFT HEART CATH AND CORONARY ANGIOGRAPHY;  Surgeon: Teodoro Spray, MD;  Location: [facility] [place];  Service: Cardiovascular;  Laterality: N/A;  . LEFT HEART CATH AND CORS/GRAFTS ANGIOGRAPHY N/A 04/29/2017   Procedure: Left Heart Cath and Cors/Grafts Angiography;  Surgeon: [name], MD;  Location: [place] [facility];  Service: Cardiovascular;  Laterality: N/A;  . LEFT HEART CATH AND CORS/GRAFTS ANGIOGRAPHY N/A 06/10/2017   Procedure: Left Heart Cath and Cors/Grafts Angiography;  Surgeon: Corey Skains, MD;  Location: [facility];  Service: Cardiovascular;  Laterality: N/A;  . LEFT HEART CATH AND CORS/GRAFTS ANGIOGRAPHY N/A 09/19/2017   Procedure: LEFT HEART CATH;  Surgeon: Corey Skains, MD;  Location: [facility];  Service: Cardiovascular;  Laterality: N/A;  . LEFT HEART CATH AND CORS/GRAFTS ANGIOGRAPHY N/A 09/18/2018   Procedure: LEFT HEART CATH AND CORS/GRAFTS ANGIOGRAPHY;  Surgeon: Corey Skains, MD;  Location: [place];  Service: Cardiovascular;  Laterality: N/A;  Family History  Problem Relation Age of Onset  . Heart disease Other   . Heart disease Father     Social History:  reports that he quit smoking about 5 years ago. He has quit using smokeless tobacco. He  reports that he does not drink alcohol or use drugs.  Allergies:  Allergies  Allergen Reactions  . Antihistamines, Chlorpheniramine-Type Other (See Comments)    Reaction: unknown  . Nitroglycerin Other (See Comments)    Reaction: hypotension    Medications: I have reviewed the patient's current medications.  No results found for this or any previous visit (from the past 48 hour(s)).  No results found.  Review of Systems  Constitutional: Positive for diaphoresis and malaise/fatigue.  HENT: Positive for congestion and hearing loss.   Eyes: Negative.   Respiratory: Positive for shortness of breath.   Cardiovascular: Positive for chest pain and palpitations.  Gastrointestinal: Positive for heartburn.  Genitourinary: Negative.   Musculoskeletal: Positive for myalgias.  Neurological: Positive for dizziness and weakness.  Endo/Heme/Allergies: Negative.   Psychiatric/Behavioral: Negative.    Blood pressure 125/74, pulse 89, temperature 98.6 F (37 C), temperature source Oral, resp. rate 19, height '5\' 7"'$  (1.702 m), weight 65.8 kg, SpO2 100 %. Physical Exam  Nursing note and vitals reviewed. Constitutional: He is oriented to person, place, and time. He appears well-developed and well-nourished.  HENT:  Head: Normocephalic and atraumatic.  Eyes: Pupils are equal, round, and reactive to light. Conjunctivae and EOM are normal.  Neck: Normal range of motion. Neck supple.  Cardiovascular: Normal rate and regular rhythm.  Murmur heard. Respiratory: Effort normal and breath sounds normal.  GI: Soft. Bowel sounds are normal.  Musculoskeletal: Normal range of motion.  Neurological: He is alert and oriented to person, place, and time. He has normal reflexes.  Skin: Skin is warm and dry.  Psychiatric: He has a normal mood and affect.    Assessment/Plan: Chest Pain Angina CHF-s CM CAD HTN AICD/PPM Hx PCI stent Elevated Tropoinin Multiple Myeloma . PLAN Agree with admit for  rule out myocardial infarction Abnormal EKG with paced rhythm Cardiomyopathy heart failure appears to be reasonably compensated continue current therapy AICD permanent pacemaker in place continue pacer and AICD follow-up Former smoker advised to refrain from tobacco abuse Chest pain atypical features rule out for myocardial infarction consider functional study do not recommend cardiac cath at this point Borderline troponins doubt non-STEMI will recommend medical therapy       Tamsen Reist D Wm Sahagun 02/21/2019, 11:40 AM

## 2019-02-22 ENCOUNTER — Other Ambulatory Visit: Payer: Medicare Other | Admitting: Student

## 2019-02-22 ENCOUNTER — Other Ambulatory Visit: Payer: Self-pay

## 2019-02-22 DIAGNOSIS — Z515 Encounter for palliative care: Secondary | ICD-10-CM

## 2019-02-22 NOTE — Progress Notes (Signed)
[facility] Consult Note Telephone: [phone]  Fax: [phone]  PATIENT NAME: Dwayne Terry DOB: 1930/07/07 MRN: [id number]  PRIMARY CARE PROVIDER:   Kirk Ruths, MD  REFERRING PROVIDER:  Kirk Ruths, MD [facility] [place], [place] [address]  RESPONSIBLE PARTY:  Daughter, Dwayne Terry  ASSESSMENT:  Palliative NP met with Dwayne Terry, Dwayne Terry and daughter Dwayne Terry. Dwayne Terry does engage in conversation; some forgetfulness noted. No acute distress. Explained role of Palliative Medicine. We discussed goals of care. Family would like for patient to receive therapy for his weakness. We discussed symptom management; we discussed his pain, as well as mood; family declines any medicinal intervention for his mood at this time. We discussed code status; Dwayne Terry is uncertain of if he would like to have CPR performed. This will be an ongoing discussion. Palliative SW will be notified to discuss/assist to see what other benefits patient may be eligible for as a Veteran. Patient/family were screened for COVID-19 prior to home visit.     RECOMMENDATIONS and PLAN:  1. Code status: Full Code; this will be an ongoing discussion. 2. Medical goals of therapy: Dwayne Terry will continue chemotherapy at [facility] as ordered. He will follow up with PCP and Cardiology as scheduled. Will contact PCP to request Home Health Therapy. Palliative Medicine will make recommendations as needed. 3. Symptom management: pain-continue acetaminophen '1000mg'$  TID, oxycodone 2.'5mg'$  BID. [facility] health therapy recommended. 4. Discharge Planning: Dwayne Terry will continue to reside at home.  5. Discussed with Dwayne Terry and daughter Dwayne Terry; they are encouraged to call with questions.   Palliative Medicine will follow up in 2 weeks or sooner, if needed.   I spent 75 minutes providing this consultation,  from 11:00am  to  12:15pm. More than 50% of the time in this consultation was spent coordinating communication.   HISTORY OF PRESENT ILLNESS:  Dwayne Terry is a 85 y.o. year old male with multiple medical problems including CAD s/p CABG, stents 2019, diabetes, hyperlipidemia, ischemic cardiomyopathy, EF 25%, MI, multiple myeloma, prostate cancer, peripheral neuropathy. Palliative Care was asked to help address goals of care. Dwayne Terry was recently hospitalized 3/21-3/22/2020 due to unstable angina, elevated troponin level and atrial fibrillation with RVR. He has been receiving chemotherapy for his multiple myeloma and is followed by Oncology at the [facility]. His multiple myeloma has been stable per daughter Dwayne Terry; he is currently on a 3 week hiatus from his chemotherapy, scheduled to receive next treatment the first week of April. Dwayne Terry currently reside at home with his wife and daughter assists with needs. He is ambulatory with a walker. Dwayne Terry reports patient being weaker, more fatigued since hospitalization. A fall reported last month. She also reports patient being very socialable and "down" since not being able to go out as before. Sleep is fair. Good appetite reported; denies weight loss. He reports his pain being managed with the oxycontin. Denies any chest pain, palpitations, feeling light headed or dizziness. He reports "indigestion" and coughing up pink sputum for which he takes pepcid and has relief; last episode two weeks ago. He reports some shortness of breath with exertion; denies at rest. He denies nausea, constipation or diarrhea. He is having more episodes of bladder incontinence. Dwayne Terry is a [place] [employer] [place]. He has a Living Will. Daughter Dwayne Terry is HCPOA.  CODE STATUS: Full Code   PPS: 50% HOSPICE ELIGIBILITY/DIAGNOSIS: TBD  PAST MEDICAL HISTORY:  Past Medical History:  Diagnosis Date  . CAD (coronary artery disease)    s/p CABg and stents in 2019  . Diabetes mellitus without  complication ([place])   . Hyperlipidemia   . Ischemic cardiomyopathy    last EF 25%  . MI (mitral incompetence)   . MI (myocardial infarction) ([place])   . Multiple myeloma (HCC)    bone marrow, prostate cancer    SOCIAL HX:  Social History   Tobacco Use  . Smoking status: Former Smoker    Last attempt to quit: 2015    Years since quitting: 5.2  . Smokeless tobacco: Former Network engineer Use Topics  . Alcohol use: No    ALLERGIES:  Allergies  Allergen Reactions  . Antihistamines, Chlorpheniramine-Type Other (See Comments)    Reaction: unknown  . Nitroglycerin Other (See Comments)    Reaction: hypotension     PERTINENT MEDICATIONS:  Outpatient Encounter Medications as of 02/22/2019  Medication Sig  . acetaminophen (TYLENOL) 500 MG tablet Take 1,000 mg by mouth 3 (three) times daily.  Marland Kitchen acyclovir (ZOVIRAX) 400 MG tablet Take 400 mg by mouth 2 (two) times daily.   Marland Kitchen aspirin EC 81 MG tablet Take 1 tablet (81 mg total) by mouth daily.  Marland Kitchen atorvastatin (LIPITOR) 80 MG tablet Take 80 mg by mouth at bedtime.   . cholecalciferol (VITAMIN D) 25 MCG (1000 UT) tablet Take 2,000 Units by mouth daily.  Marland Kitchen lisinopril (PRINIVIL,ZESTRIL) 5 MG tablet Take 2.5 mg by mouth daily.  . metoprolol succinate (TOPROL-XL) 25 MG 24 hr tablet Take 12.5 mg by mouth daily.  . Multiple Vitamin (MULTIVITAMIN WITH MINERALS) TABS tablet Take 1 tablet by mouth daily.  Marland Kitchen oxyCODONE (OXY IR/ROXICODONE) 5 MG immediate release tablet Take 2.5 mg by mouth every 6 (six) hours as needed for severe pain.  . ticagrelor (BRILINTA) 90 MG TABS tablet Take 90 mg by mouth 2 (two) times daily.   No facility-administered encounter medications on file as of 02/22/2019.     PHYSICAL EXAM:   General: NAD, frail appearing Cardiovascular: regular rate and rhythm Pulmonary: clear ant fields Abdomen: soft, nontender, + bowel sounds GU: no suprapubic tenderness Extremities: no edema, no joint deformities Skin: no rashes  Neurological: Weakness but otherwise nonfocal  Dwayne Slocumb, NP

## 2019-02-26 ENCOUNTER — Telehealth: Payer: Self-pay

## 2019-02-26 NOTE — Telephone Encounter (Signed)
Phone call placed to PCP office. Spoke with April to request order for PT/OT sent to home health agency per recommendations of NP and request of patient's family

## 2019-03-06 ENCOUNTER — Emergency Department: Payer: Medicare Other

## 2019-03-06 ENCOUNTER — Emergency Department
Admission: EM | Admit: 2019-03-06 | Discharge: 2019-03-07 | Disposition: A | Discharge status: Other Acute Inpatient Hospital | Payer: Medicare Other | Attending: Emergency Medicine | Admitting: Emergency Medicine

## 2019-03-06 ENCOUNTER — Other Ambulatory Visit: Payer: Self-pay

## 2019-03-06 DIAGNOSIS — R4182 Altered mental status, unspecified: Secondary | ICD-10-CM | POA: Diagnosis not present

## 2019-03-06 DIAGNOSIS — I10 Essential (primary) hypertension: Secondary | ICD-10-CM | POA: Insufficient documentation

## 2019-03-06 DIAGNOSIS — Z7982 Long term (current) use of aspirin: Secondary | ICD-10-CM | POA: Diagnosis not present

## 2019-03-06 DIAGNOSIS — R634 Abnormal weight loss: Secondary | ICD-10-CM | POA: Insufficient documentation

## 2019-03-06 DIAGNOSIS — Z7902 Long term (current) use of antithrombotics/antiplatelets: Secondary | ICD-10-CM | POA: Diagnosis not present

## 2019-03-06 DIAGNOSIS — E119 Type 2 diabetes mellitus without complications: Secondary | ICD-10-CM | POA: Diagnosis not present

## 2019-03-06 DIAGNOSIS — Z87891 Personal history of nicotine dependence: Secondary | ICD-10-CM | POA: Insufficient documentation

## 2019-03-06 DIAGNOSIS — R531 Weakness: Secondary | ICD-10-CM | POA: Diagnosis present

## 2019-03-06 DIAGNOSIS — E871 Hypo-osmolality and hyponatremia: Secondary | ICD-10-CM | POA: Insufficient documentation

## 2019-03-06 DIAGNOSIS — Z79899 Other long term (current) drug therapy: Secondary | ICD-10-CM | POA: Diagnosis not present

## 2019-03-06 DIAGNOSIS — I259 Chronic ischemic heart disease, unspecified: Secondary | ICD-10-CM | POA: Insufficient documentation

## 2019-03-06 DIAGNOSIS — I252 Old myocardial infarction: Secondary | ICD-10-CM | POA: Diagnosis not present

## 2019-03-06 LAB — CBC WITH DIFFERENTIAL/PLATELET
Abs Immature Granulocytes: 0.02 10*3/uL (ref 0.00–0.07)
Basophils Absolute: 0 10*3/uL (ref 0.0–0.1)
Basophils Relative: 1 %
Eosinophils Absolute: 0.3 10*3/uL (ref 0.0–0.5)
Eosinophils Relative: 4 %
HCT: 31.2 % — ABNORMAL LOW (ref 39.0–52.0)
Hemoglobin: 10.1 g/dL — ABNORMAL LOW (ref 13.0–17.0)
Immature Granulocytes: 0 %
Lymphocytes Relative: 9 %
Lymphs Abs: 0.7 10*3/uL (ref 0.7–4.0)
MCH: 27.6 pg (ref 26.0–34.0)
MCHC: 32.4 g/dL (ref 30.0–36.0)
MCV: 85.2 fL (ref 80.0–100.0)
Monocytes Absolute: 0.8 10*3/uL (ref 0.1–1.0)
Monocytes Relative: 11 %
Neutro Abs: 5.8 10*3/uL (ref 1.7–7.7)
Neutrophils Relative %: 75 %
Platelets: 141 10*3/uL — ABNORMAL LOW (ref 150–400)
RBC: 3.66 MIL/uL — ABNORMAL LOW (ref 4.22–5.81)
RDW: 16.7 % — ABNORMAL HIGH (ref 11.5–15.5)
WBC: 7.8 10*3/uL (ref 4.0–10.5)
nRBC: 0 % (ref 0.0–0.2)

## 2019-03-06 LAB — COMPREHENSIVE METABOLIC PANEL
ALT: 18 U/L (ref 0–44)
AST: 22 U/L (ref 15–41)
Albumin: 3.7 g/dL (ref 3.5–5.0)
Alkaline Phosphatase: 60 U/L (ref 38–126)
Anion gap: 10 (ref 5–15)
BUN: 15 mg/dL (ref 8–23)
CO2: 21 mmol/L — ABNORMAL LOW (ref 22–32)
Calcium: 9 mg/dL (ref 8.9–10.3)
Chloride: 95 mmol/L — ABNORMAL LOW (ref 98–111)
Creatinine, Ser: 1.04 mg/dL (ref 0.61–1.24)
GFR calc Af Amer: >60 mL/min (ref >60)
GFR calc non Af Amer: >60 mL/min (ref >60)
Glucose, Bld: 167 mg/dL — ABNORMAL HIGH (ref 70–99)
Potassium: 4.1 mmol/L (ref 3.5–5.1)
Sodium: 126 mmol/L — ABNORMAL LOW (ref 135–145)
Total Bilirubin: 0.8 mg/dL (ref 0.3–1.2)
Total Protein: 7.5 g/dL (ref 6.5–8.1)

## 2019-03-06 LAB — URINALYSIS, COMPLETE (UACMP) WITH MICROSCOPIC
Bacteria, UA: NONE SEEN
Bilirubin Urine: NEGATIVE
Glucose, UA: NEGATIVE mg/dL
Hgb urine dipstick: NEGATIVE
Ketones, ur: NEGATIVE mg/dL
Leukocytes,Ua: NEGATIVE
Nitrite: NEGATIVE
Protein, ur: NEGATIVE mg/dL
Specific Gravity, Urine: 1.014 (ref 1.005–1.030)
Squamous Epithelial / HPF: NONE SEEN (ref 0–5)
pH: 6 (ref 5.0–8.0)

## 2019-03-06 MED ORDER — SODIUM CHLORIDE 0.9 % IV SOLN
Freq: Once | INTRAVENOUS | Status: AC
  Administered 2019-03-06: 21:00:00 via INTRAVENOUS

## 2019-03-06 MED ORDER — IOHEXOL 300 MG/ML  SOLN
100.0000 mL | Freq: Once | INTRAMUSCULAR | Status: AC | PRN
  Administered 2019-03-06: 19:00:00 100 mL via INTRAVENOUS

## 2019-03-06 NOTE — ED Notes (Signed)
[facility] Dr[facility] Cinda Quest

## 2019-03-06 NOTE — ED Provider Notes (Signed)
[facility] Emergency Department Provider Note   ____________________________________________   First MD Initiated Contact with Patient 03/06/19 1719     (approximate)  I have reviewed the triage vital signs and the nursing notes.   HISTORY  Chief Complaint Weakness and Weight Loss  HPI Dwayne Terry is a 85 y.o. male who has a history of multiple myeloma coronary artery disease diabetes and hyperlipidemia who has been having weight loss recently he is not feeling hungry is not eating and his belly is kind of hurting.  He is lost 6 pounds in the last week.  He seems to be a little confused at home.  Here he is not.  He gets most of his care at the [place].  He was sent here by the cancer center though.         Past Medical History:  Diagnosis Date   CAD (coronary artery disease)    s/p CABg and stents in 2019   Diabetes mellitus without complication (HCC)    Hyperlipidemia    Ischemic cardiomyopathy    last EF 25%   MI (mitral incompetence)    MI (myocardial infarction) ([place])    Multiple myeloma ([place])    bone marrow, prostate cancer    Patient Active Problem List   Diagnosis Date Noted   Unstable angina ([place]) 02/18/2019   Atrial fibrillation with RVR ([place]) 02/18/2019   Pulmonary edema 12/30/2017   HLD (hyperlipidemia) 09/16/2017   Diabetes ([place]) 09/16/2017   Chest pain 04/29/2017   NSTEMI (non-ST elevated myocardial infarction) ([place]) 04/29/2017   Presence of coronary angioplasty implant and graft 03/21/2017   Chest pain, rule out acute myocardial infarction 01/27/2017   Dyspnea on exertion 09/06/2014   Malignant neoplasm of prostate ([place]) 05/09/2014   Atherosclerotic heart disease of native coronary artery without angina pectoris 07/10/2013   Essential (primary) hypertension 07/10/2013    Past Surgical History:  Procedure Laterality Date   AORTIC VALVE REPLACEMENT (AVR)/CORONARY ARTERY BYPASS GRAFTING (CABG)      CORONARY ANGIOPLASTY WITH STENT PLACEMENT     CORONARY BALLOON ANGIOPLASTY N/A 06/10/2017   Procedure: Coronary Balloon Angioplasty;  Surgeon: Wellington Hampshire, MD;  Location: [place] [facility];  Service: Cardiovascular;  Laterality: N/A;   CORONARY STENT INTERVENTION N/A 01/28/2017   Procedure: Coronary Stent Intervention;  Surgeon: [name], MD;  Location: [place] [facility];  Service: Cardiovascular;  Laterality: N/A;   CORONARY STENT INTERVENTION N/A 04/29/2017   Procedure: Coronary Stent Intervention;  Surgeon: [name], MD;  Location: [place] [facility];  Service: Cardiovascular;  Laterality: N/A;   CORONARY STENT INTERVENTION N/A 09/19/2017   Procedure: CORONARY/GRAFT ANGIOGRAPHY;  Surgeon: Corey Skains, MD;  Location: [place] [facility];  Service: Cardiovascular;  Laterality: N/A;   CORONARY STENT INTERVENTION N/A 09/19/2017   Procedure: CORONARY STENT INTERVENTION;  Surgeon: Wellington Hampshire, MD;  Location: [place] [facility];  Service: Cardiovascular;  Laterality: N/A;   LEFT HEART CATH AND CORONARY ANGIOGRAPHY N/A 01/28/2017   Procedure: Left Heart Cath and Coronary Angiography;  Surgeon: [name], MD;  Location: [place] [facility];  Service: Cardiovascular;  Laterality: N/A;   LEFT HEART CATH AND CORONARY ANGIOGRAPHY N/A 10/31/2017   Procedure: LEFT HEART CATH AND CORONARY ANGIOGRAPHY;  Surgeon: Teodoro Spray, MD;  Location: [place] [facility];  Service: Cardiovascular;  Laterality: N/A;   LEFT HEART CATH AND CORS/GRAFTS ANGIOGRAPHY N/A 04/29/2017   Procedure: Left Heart Cath and Cors/Grafts Angiography;  Surgeon: [name], MD;  Location: [place] [facility];  Service: Cardiovascular;  Laterality: N/A;   LEFT HEART CATH AND CORS/GRAFTS ANGIOGRAPHY N/A 06/10/2017   Procedure: Left Heart Cath and Cors/Grafts Angiography;  Surgeon: Corey Skains, MD;  Location: [place] [facility];  Service: Cardiovascular;   Laterality: N/A;   LEFT HEART CATH AND CORS/GRAFTS ANGIOGRAPHY N/A 09/19/2017   Procedure: LEFT HEART CATH;  Surgeon: Corey Skains, MD;  Location: [place] [facility];  Service: Cardiovascular;  Laterality: N/A;   LEFT HEART CATH AND CORS/GRAFTS ANGIOGRAPHY N/A 09/18/2018   Procedure: LEFT HEART CATH AND CORS/GRAFTS ANGIOGRAPHY;  Surgeon: Corey Skains, MD;  Location: [place] [facility];  Service: Cardiovascular;  Laterality: N/A;    Prior to Admission medications   Medication Sig Start Date End Date Taking? Authorizing Provider  acetaminophen (TYLENOL) 500 MG tablet Take 1,000 mg by mouth 3 (three) times daily.     [provider]  acyclovir (ZOVIRAX) 400 MG tablet Take 400 mg by mouth 2 (two) times daily.     [provider]  aspirin EC 81 MG tablet Take 1 tablet (81 mg total) by mouth daily. 01/29/17   Loletha Grayer, MD  atorvastatin (LIPITOR) 80 MG tablet Take 80 mg by mouth at bedtime.  05/18/18   [provider]  cholecalciferol (VITAMIN D) 25 MCG (1000 UT) tablet Take 2,000 Units by mouth daily.    [provider]  lisinopril (PRINIVIL,ZESTRIL) 5 MG tablet Take 2.5 mg by mouth daily.    [provider]  metoprolol succinate (TOPROL-XL) 25 MG 24 hr tablet Take 12.5 mg by mouth daily.    [provider]  Multiple Vitamin (MULTIVITAMIN WITH MINERALS) TABS tablet Take 1 tablet by mouth daily.    [provider]  oxyCODONE (OXY IR/ROXICODONE) 5 MG immediate release tablet Take 2.5 mg by mouth every 6 (six) hours as needed for severe pain. Twice daily    [provider]  ticagrelor (BRILINTA) 90 MG TABS tablet Take 90 mg by mouth 2 (two) times daily.    [provider]    Allergies Antihistamines, chlorpheniramine-type and Nitroglycerin  Family History  Problem Relation Age of Onset   Heart disease Other    Heart disease Father     Social History Social History   Tobacco Use   Smoking  status: Former Smoker    Last attempt to quit: 2015    Years since quitting: 5.2   Smokeless tobacco: Former Systems developer  Substance Use Topics   Alcohol use: No   Drug use: No    Review of Systems  Constitutional: No fever/chills Eyes: No visual changes. ENT: No sore throat. Cardiovascular: Denies chest pain. Respiratory: Denies shortness of breath. Gastrointestinal see HPI Genitourinary: Negative for dysuria. Musculoskeletal: Negative for back pain. Skin: Negative for rash. Neurological: Negative for headaches, focal weakness  ____________________________________________   PHYSICAL EXAM:  VITAL SIGNS: ED Triage Vitals  Enc Vitals Group     BP 03/06/19 1730 119/62     Pulse Rate 03/06/19 1730 80     Resp 03/06/19 1730 17     Temp 03/06/19 1730 97.8 F (36.6 C)     Temp Source 03/06/19 1730 Oral     SpO2 03/06/19 1730 100 %     Weight 03/06/19 1731 132 lb 4.4 oz (60 kg)     Height 03/06/19 1731 '5\' 7"'$  (1.702 m)     Head Circumference --      Peak Flow --  Pain Score 03/06/19 1731 4     Pain Loc --      Pain Edu? --      Excl. in [place]? --     Constitutional: Alert and oriented. Well appearing and in no acute distress. Eyes: Conjunctivae are normal. . Head: Atraumatic. Nose: No congestion/rhinnorhea. Mouth/Throat: Mucous membranes are moist.  Oropharynx non-erythematous. Neck: No stridor.   Cardiovascular: Normal rate, regular rhythm. Grossly normal heart sounds.  Good peripheral circulation. Respiratory: Normal respiratory effort.  No retractions. Lungs CTAB. Gastrointestinal: Soft and nontender. No distention. No abdominal bruits. No CVA tenderness. Musculoskeletal: No lower extremity tenderness nor edema.   Neurologic:  Normal speech and language. No gross focal neurologic deficits are appreciated.  Skin:  Skin is warm, dry and intact. No rash noted.   ____________________________________________   LABS (all labs ordered are listed, but only abnormal  results are displayed)  Labs Reviewed  COMPREHENSIVE METABOLIC PANEL - Abnormal; Notable for the following components:      Result Value   Sodium 126 (*)    Chloride 95 (*)    CO2 21 (*)    Glucose, Bld 167 (*)    All other components within normal limits  URINALYSIS, COMPLETE (UACMP) WITH MICROSCOPIC - Abnormal; Notable for the following components:   Color, Urine YELLOW (*)    APPearance CLEAR (*)    All other components within normal limits  CBC WITH DIFFERENTIAL/PLATELET - Abnormal; Notable for the following components:   RBC 3.66 (*)    Hemoglobin 10.1 (*)    HCT 31.2 (*)    RDW 16.7 (*)    Platelets 141 (*)    All other components within normal limits   ____________________________________________  EKG  EKG read interpreted by me shows A. fib at a rate of 78 normal axis left bundle branch block KG looks similar to prior EKGs. ____________________________________________  RADIOLOGY  ED MD interpretation: X-ray read by radiology reviewed by me shows no new problems. CT of the head and abdomen show no acute disease.  Films were read by radiology reviewed by me.  Official radiology report(s): Ct Head Wo Contrast  Result Date: 03/06/2019 CLINICAL DATA:  Altered level of consciousness. History of multiple myeloma, prostate cancer EXAM: CT HEAD WITHOUT CONTRAST TECHNIQUE: Contiguous axial images were obtained from the base of the skull through the vertex without intravenous contrast. COMPARISON:  MRI head 07/10/2013 FINDINGS: Brain: Moderate atrophy. Negative for acute infarct, hemorrhage, mass. Mild white matter disease bilaterally appears chronic. Vascular: Negative for hyperdense vessel Skull: No skull lesions.  Negative for skull fracture Sinuses/Orbits: Negative Other: None IMPRESSION: Generalized atrophy and chronic microvascular ischemic change in the white matter. No acute abnormality. Electronically Signed   By: Franchot Gallo M.D.   On: 03/06/2019 19:20   Ct Abdomen  Pelvis W Contrast  Result Date: 03/06/2019 CLINICAL DATA:  85 year old with weakness and 10 pound weight loss over the past 2 weeks. Not eating. Abdominal pain. History of multiple myeloma. EXAM: CT ABDOMEN AND PELVIS WITH CONTRAST TECHNIQUE: Multidetector CT imaging of the abdomen and pelvis was performed using the standard protocol following bolus administration of intravenous contrast. CONTRAST:  122m OMNIPAQUE IOHEXOL 300 MG/ML  SOLN COMPARISON:  Chest, abdomen, pelvis CT 12/29/2017 FINDINGS: Lower chest: Subpleural reticulation in the lung bases suspicious for pulmonary fibrosis, as seen on prior CT. No confluent or new airspace disease. No pleural fluid. Mild cardiomegaly with dense coronary artery calcifications versus stents. Hepatobiliary: Small cyst in the left lobe of the  liver. No new hepatic lesion. Calcified gallstone in the gallbladder without CT findings of gallbladder inflammation. No biliary dilatation. Pancreas: Fatty atrophy.  No ductal dilatation or inflammation. Spleen: Normal in size without focal abnormality. Adrenals/Urinary Tract: Normal adrenal glands. No hydronephrosis or perinephric edema. Homogeneous renal enhancement with symmetric excretion on delayed phase imaging. Mild cortical scarring in the lower right kidney. Exophytic 2.2 cm cyst in the lower left kidney. Urinary bladder is physiologically distended. Mild to moderate bladder wall thickening. Stomach/Bowel: Bowel evaluation is limited in the absence of enteric contrast. Stomach physiologically distended without gastric wall thickening. Duodenal diverticulum without inflammation. No small bowel obstruction, wall thickening, or inflammatory change. Colonic diverticulosis, prominent in the distal descending and sigmoid colon. Moderate colonic stool burden. No colonic wall thickening or inflammatory change. Normal appendix tentatively identified. Vascular/Lymphatic: Advanced aortic and branch atherosclerosis with calcified and  noncalcified atheromatous plaque. Short segment dissection in the infrarenal aorta image 35 series 2, partially calcified and appears chronic, with similar aortic calcifications on prior noncontrast exam. No periaortic stranding. No aneurysm or severe stenosis. Portal vein and mesenteric vessels are patent. No enlarged lymph nodes in the abdomen or pelvis. Reproductive: Enlarged prostate gland spanning 5.2 cm Other: No ascites or free air. Musculoskeletal: Bones are diffusely under mineralized. Remote right rib fractures. The degree of osteopenia/osteoporosis limits evaluation for lytic lesion in this patient with multiple myeloma. No evidence of acute fracture. IMPRESSION: 1. Bladder wall thickening. This may be due to cystitis or chronic bladder outlet obstruction secondary to enlarged prostate gland. 2. No other acute findings or explanation for weight loss. 3. Colonic diverticulosis without diverticulitis. Gallstone without CT findings of acute cholecystitis. 4. Advanced Aortic Atherosclerosis (ICD10-I70.0). 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 Electronically Signed   By: Keith Rake M.D.   On: 03/06/2019 19:28   Dg Chest Portable 1 View  Result Date: 03/06/2019 CLINICAL DATA:  Weakness and weight loss. History of multiple myeloma EXAM: PORTABLE CHEST 1 VIEW COMPARISON:  February 17, 2019; January 14, 2018 FINDINGS: There is no frank edema or consolidation. There is interstitial thickening bilaterally, similar to recent prior study. Heart is upper normal in size with pulmonary vascularity normal. There is aortic atherosclerosis. Patient is status post coronary artery bypass grafting. No focal bone lesions evident. A loop recorder is noted on the left, unchanged in position. No evident adenopathy. IMPRESSION: Stable interstitial thickening which may represent chronic inflammatory type change. No new opacity is evident compared to most recent study. No frank consolidation. Stable cardiac  silhouette. Patient is status post coronary artery bypass grafting. Aortic Atherosclerosis (ICD10-I70.0). Electronically Signed   By: Lowella Grip III M.D.   On: 03/06/2019 18:26    ____________________________________________   PROCEDURES  Procedure(s) performed (including Critical Care):  Procedures   ____________________________________________   INITIAL IMPRESSION / ASSESSMENT AND PLAN / ED COURSE  Patient is only problem that I can find is hyponatremia.  This could easily explain his lack of mental alertness.  [facility]             ____________________________________________   FINAL CLINICAL IMPRESSION(S) / ED DIAGNOSES  Final diagnoses:  Weakness  Altered mental status, unspecified altered mental status type  Hyponatremia     ED Discharge Orders    None       Note:  This document was prepared using Dragon voice recognition software and may include unintentional dictation errors.  Nena Polio, MD 03/07/19 [phone]

## 2019-03-06 NOTE — ED Notes (Signed)
Patient's daughter would like to be informed when or if patient is transported to the [place].

## 2019-03-06 NOTE — ED Triage Notes (Signed)
Pt BIB [facility] for weakness and 10lb weight loss over the past 2 weeks. Pt with hx of multiple myloma, stopped chemo 3 weeks ago due to weight loss. Poor PO Intake reported, Pt denies any pain or discomfort. Afebrile. HX of Multiple MI's.

## 2019-03-06 NOTE — ED Notes (Signed)
ED TO INPATIENT HANDOFF REPORT  ED Nurse Name and Phone #:  S Name/Age/Gender [name] 85 y.o. male Room/Bed: ED14A/ED14A  Code Status   Code Status: Prior  Home/SNF/Other Home Patient oriented to: self, place, time and situation Is this baseline? Yes   Triage Complete: Triage complete  Chief Complaint weakness  Triage Note Pt BIB [facility] for weakness and 10lb weight loss over the past 2 weeks. Pt with hx of multiple myloma, stopped chemo 3 weeks ago due to weight loss. Poor PO Intake reported, Pt denies any pain or discomfort. Afebrile. HX of Multiple MI's.    Allergies Allergies  Allergen Reactions  . Antihistamines, Chlorpheniramine-Type Other (See Comments)    Reaction: unknown  . Nitroglycerin Other (See Comments)    Reaction: hypotension    Level of Care/Admitting Diagnosis ED Disposition    ED Disposition Condition Comment   Transfer to Another Facility  The patient appears reasonably stabilized for transfer considering the current resources, flow, and capabilities available in the ED at this time, and I doubt any other [facility] requiring further screening and/or treatment in the ED prior to transfer is p resent.       B Medical/Surgery History Past Medical History:  Diagnosis Date  . CAD (coronary artery disease)    s/p CABg and stents in 2019  . Diabetes mellitus without complication ([place])   . Hyperlipidemia   . Ischemic cardiomyopathy    last EF 25%  . MI (mitral incompetence)   . MI (myocardial infarction) ([place])   . Multiple myeloma (H[facility])    bone marrow, prostate cancer   Past Surgical History:  Procedure Laterality Date  . AORTIC VALVE REPLACEMENT (AVR)/CORONARY ARTERY BYPASS GRAFTING (CABG)    . CORONARY ANGIOPLASTY WITH STENT PLACEMENT    . CORONARY BALLOON ANGIOPLASTY N/A 06/10/2017   Procedure: Coronary Balloon Angioplasty;  Surgeon: Wellington Hampshire, MD;  Location: [place] [facility];  Service: Cardiovascular;  Laterality: N/A;  .  CORONARY STENT INTERVENTION N/A 01/28/2017   Procedure: Coronary Stent Intervention;  Surgeon: [name], MD;  Location: [place] [facility];  Service: Cardiovascular;  Laterality: N/A;  . CORONARY STENT INTERVENTION N/A 04/29/2017   Procedure: Coronary Stent Intervention;  Surgeon: [name], MD;  Location: [place] [facility];  Service: Cardiovascular;  Laterality: N/A;  . CORONARY STENT INTERVENTION N/A 09/19/2017   Procedure: CORONARY/GRAFT ANGIOGRAPHY;  Surgeon: Corey Skains, MD;  Location: [place] [facility];  Service: Cardiovascular;  Laterality: N/A;  . CORONARY STENT INTERVENTION N/A 09/19/2017   Procedure: CORONARY STENT INTERVENTION;  Surgeon: Wellington Hampshire, MD;  Location: [place] [facility];  Service: Cardiovascular;  Laterality: N/A;  . LEFT HEART CATH AND CORONARY ANGIOGRAPHY N/A 01/28/2017   Procedure: Left Heart Cath and Coronary Angiography;  Surgeon: [name], MD;  Location: [place] [facility];  Service: Cardiovascular;  Laterality: N/A;  . LEFT HEART CATH AND CORONARY ANGIOGRAPHY N/A 10/31/2017   Procedure: LEFT HEART CATH AND CORONARY ANGIOGRAPHY;  Surgeon: Teodoro Spray, MD;  Location: [place] [facility];  Service: Cardiovascular;  Laterality: N/A;  . LEFT HEART CATH AND CORS/GRAFTS ANGIOGRAPHY N/A 04/29/2017   Procedure: Left Heart Cath and Cors/Grafts Angiography;  Surgeon: [name], MD;  Location: [place] [facility];  Service: Cardiovascular;  Laterality: N/A;  . LEFT HEART CATH AND CORS/GRAFTS ANGIOGRAPHY N/A 06/10/2017   Procedure: Left Heart Cath and Cors/Grafts Angiography;  Surgeon: Corey Skains, MD;  Location: [place] [facility];  Service:  Cardiovascular;  Laterality: N/A;  . LEFT HEART CATH AND CORS/GRAFTS ANGIOGRAPHY N/A 09/19/2017   Procedure: LEFT HEART CATH;  Surgeon: Corey Skains, MD;  Location: [place] [facility];  Service: Cardiovascular;  Laterality: N/A;  . LEFT HEART CATH AND CORS/GRAFTS  ANGIOGRAPHY N/A 09/18/2018   Procedure: LEFT HEART CATH AND CORS/GRAFTS ANGIOGRAPHY;  Surgeon: Corey Skains, MD;  Location: [place] [facility];  Service: Cardiovascular;  Laterality: N/A;     A IV Location/Drains/Wounds Patient Lines/Drains/Airways Status   Active Line/Drains/Airways    Name:   Placement date:   Placement time:   Site:   Days:   Peripheral IV 03/06/19 Right Antecubital   03/06/19    1744    Antecubital   less than 1          Intake/Output Last 24 hours No intake or output data in the 24 hours ending 03/06/19 2332  Labs/Imaging Results for orders placed or performed during the hospital encounter of 03/06/19 (from the past 48 hour(s))  Comprehensive metabolic panel     Status: Abnormal   Collection Time: 03/06/19  5:43 PM  Result Value Ref Range   Sodium 126 (L) 135 - 145 mmol/L   Potassium 4.1 3.5 - 5.1 mmol/L   Chloride 95 (L) 98 - 111 mmol/L   CO2 21 (L) 22 - 32 mmol/L   Glucose, Bld 167 (H) 70 - 99 mg/dL   BUN 15 8 - 23 mg/dL   Creatinine, Ser 1.04 0.61 - 1.24 mg/dL   Calcium 9.0 8.9 - 10.3 mg/dL   Total Protein 7.5 6.5 - 8.1 g/dL   Albumin 3.7 3.5 - 5.0 g/dL   AST 22 15 - 41 U/L   ALT 18 0 - 44 U/L   Alkaline Phosphatase 60 38 - 126 U/L   Total Bilirubin 0.8 0.3 - 1.2 mg/dL   GFR calc non Af Amer >60 >60 mL/min   GFR calc Af Amer >60 >60 mL/min   Anion gap 10 5 - 15    Comment: Performed at [facility], [address] [place], [place] [address]  CBC with Differential     Status: Abnormal   Collection Time: 03/06/19  5:43 PM  Result Value Ref Range   WBC 7.8 4.0 - 10.5 K/uL   RBC 3.66 (L) 4.22 - 5.81 MIL/uL   Hemoglobin 10.1 (L) 13.0 - 17.0 g/dL   HCT 31.2 (L) 39.0 - 52.0 %   MCV 85.2 80.0 - 100.0 fL   MCH 27.6 26.0 - 34.0 pg   MCHC 32.4 30.0 - 36.0 g/dL   RDW 16.7 (H) 11.5 - 15.5 %   Platelets 141 (L) 150 - 400 K/uL   nRBC 0.0 0.0 - 0.2 %   Neutrophils Relative % 75 %   Neutro Abs 5.8 1.7 - 7.7 K/uL   Lymphocytes Relative  9 %   Lymphs Abs 0.7 0.7 - 4.0 K/uL   Monocytes Relative 11 %   Monocytes Absolute 0.8 0.1 - 1.0 K/uL   Eosinophils Relative 4 %   Eosinophils Absolute 0.3 0.0 - 0.5 K/uL   Basophils Relative 1 %   Basophils Absolute 0.0 0.0 - 0.1 K/uL   Immature Granulocytes 0 %   Abs Immature Granulocytes 0.02 0.00 - 0.07 K/uL    Comment: Performed at [facility], [address] [place], [place] [address]  Urinalysis, Complete w Microscopic     Status: Abnormal   Collection Time: 03/06/19  8:23 PM  Result  Value Ref Range   Color, Urine YELLOW (A) YELLOW   APPearance CLEAR (A) CLEAR   Specific Gravity, Urine 1.014 1.005 - 1.030   pH 6.0 5.0 - 8.0   Glucose, UA NEGATIVE NEGATIVE mg/dL   Hgb urine dipstick NEGATIVE NEGATIVE   Bilirubin Urine NEGATIVE NEGATIVE   Ketones, ur NEGATIVE NEGATIVE mg/dL   Protein, ur NEGATIVE NEGATIVE mg/dL   Nitrite NEGATIVE NEGATIVE   Leukocytes,Ua NEGATIVE NEGATIVE   RBC / HPF 0-5 0 - 5 RBC/hpf   WBC, UA 0-5 0 - 5 WBC/hpf   Bacteria, UA NONE SEEN NONE SEEN   Squamous Epithelial / LPF NONE SEEN 0 - 5   Mucus PRESENT     Comment: Performed at [facility], [address] [place], [place] [address]   Ct Head Wo Contrast  Result Date: 03/06/2019 CLINICAL DATA:  Altered level of consciousness. History of multiple myeloma, prostate cancer EXAM: CT HEAD WITHOUT CONTRAST TECHNIQUE: Contiguous axial images were obtained from the base of the skull through the vertex without intravenous contrast. COMPARISON:  MRI head 07/10/2013 FINDINGS: Brain: Moderate atrophy. Negative for acute infarct, hemorrhage, mass. Mild white matter disease bilaterally appears chronic. Vascular: Negative for hyperdense vessel Skull: No skull lesions.  Negative for skull fracture Sinuses/Orbits: Negative Other: None IMPRESSION: Generalized atrophy and chronic microvascular ischemic change in the white matter. No acute abnormality. Electronically Signed   By: Franchot Gallo M.D.   On:  03/06/2019 19:20   Ct Abdomen Pelvis W Contrast  Result Date: 03/06/2019 CLINICAL DATA:  85 year old with weakness and 10 pound weight loss over the past 2 weeks. Not eating. Abdominal pain. History of multiple myeloma. EXAM: CT ABDOMEN AND PELVIS WITH CONTRAST TECHNIQUE: Multidetector CT imaging of the abdomen and pelvis was performed using the standard protocol following bolus administration of intravenous contrast. CONTRAST:  1107m OMNIPAQUE IOHEXOL 300 MG/ML  SOLN COMPARISON:  Chest, abdomen, pelvis CT 12/29/2017 FINDINGS: Lower chest: Subpleural reticulation in the lung bases suspicious for pulmonary fibrosis, as seen on prior CT. No confluent or new airspace disease. No pleural fluid. Mild cardiomegaly with dense coronary artery calcifications versus stents. Hepatobiliary: Small cyst in the left lobe of the liver. No new hepatic lesion. Calcified gallstone in the gallbladder without CT findings of gallbladder inflammation. No biliary dilatation. Pancreas: Fatty atrophy.  No ductal dilatation or inflammation. Spleen: Normal in size without focal abnormality. Adrenals/Urinary Tract: Normal adrenal glands. No hydronephrosis or perinephric edema. Homogeneous renal enhancement with symmetric excretion on delayed phase imaging. Mild cortical scarring in the lower right kidney. Exophytic 2.2 cm cyst in the lower left kidney. Urinary bladder is physiologically distended. Mild to moderate bladder wall thickening. Stomach/Bowel: Bowel evaluation is limited in the absence of enteric contrast. Stomach physiologically distended without gastric wall thickening. Duodenal diverticulum without inflammation. No small bowel obstruction, wall thickening, or inflammatory change. Colonic diverticulosis, prominent in the distal descending and sigmoid colon. Moderate colonic stool burden. No colonic wall thickening or inflammatory change. Normal appendix tentatively identified. Vascular/Lymphatic: Advanced aortic and branch  atherosclerosis with calcified and noncalcified atheromatous plaque. Short segment dissection in the infrarenal aorta image 35 series 2, partially calcified and appears chronic, with similar aortic calcifications on prior noncontrast exam. No periaortic stranding. No aneurysm or severe stenosis. Portal vein and mesenteric vessels are patent. No enlarged lymph nodes in the abdomen or pelvis. Reproductive: Enlarged prostate gland spanning 5.2 cm Other: No ascites or free air. Musculoskeletal: Bones are diffusely under mineralized. Remote right rib fractures. The degree  of osteopenia/osteoporosis limits evaluation for lytic lesion in this patient with multiple myeloma. No evidence of acute fracture. IMPRESSION: 1. Bladder wall thickening. This may be due to cystitis or chronic bladder outlet obstruction secondary to enlarged prostate gland. 2. No other acute findings or explanation for weight loss. 3. Colonic diverticulosis without diverticulitis. Gallstone without CT findings of acute cholecystitis. 4. Advanced Aortic Atherosclerosis (ICD10-I70.0). 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 Electronically Signed   By: Keith Rake M.D.   On: 03/06/2019 19:28   Dg Chest Portable 1 View  Result Date: 03/06/2019 CLINICAL DATA:  Weakness and weight loss. History of multiple myeloma EXAM: PORTABLE CHEST 1 VIEW COMPARISON:  February 17, 2019; January 14, 2018 FINDINGS: There is no frank edema or consolidation. There is interstitial thickening bilaterally, similar to recent prior study. Heart is upper normal in size with pulmonary vascularity normal. There is aortic atherosclerosis. Patient is status post coronary artery bypass grafting. No focal bone lesions evident. A loop recorder is noted on the left, unchanged in position. No evident adenopathy. IMPRESSION: Stable interstitial thickening which may represent chronic inflammatory type change. No new opacity is evident compared to most recent study. No  frank consolidation. Stable cardiac silhouette. Patient is status post coronary artery bypass grafting. Aortic Atherosclerosis (ICD10-I70.0). Electronically Signed   By: Lowella Grip III M.D.   On: 03/06/2019 18:26    Pending Labs Unresulted Labs (From admission, onward)   None      Vitals/Pain Today's Vitals   03/06/19 2230 03/06/19 2300 03/06/19 2320 03/06/19 2326  BP: 118/73 (!) 124/56 136/69 136/69  Pulse:   81 82  Resp: 12 (!) '21 18 18  ' Temp:    97.8 F (36.6 C)  TempSrc:      SpO2:   100% 100%  Weight:      Height:      PainSc:   0-No pain     Isolation Precautions No active isolations  Medications Medications  iohexol (OMNIPAQUE) 300 MG/ML solution 100 mL (100 mLs Intravenous Contrast Given 03/06/19 1840)  0.9 %  sodium chloride infusion ( Intravenous New Bag/Given 03/06/19 2104)    Mobility walks Moderate fall risk   Focused Assessments weakness   R Recommendations: See Admitting Provider Note  Report given to:   Additional Notes:

## 2019-03-07 NOTE — ED Notes (Signed)
[facility] is here to take the Pt to the [place].

## 2019-03-28 ENCOUNTER — Telehealth: Payer: Self-pay | Admitting: Student

## 2019-03-28 NOTE — Telephone Encounter (Signed)
Palliative NP called to follow up on patient. Daughter Olin Hauser states patient had been admitted to a different Hospice. Referral had been sent in by the [facility] after recent hospitalization. She was unsure which hospice he was admitted to.

## 2019-04-03 ENCOUNTER — Telehealth: Payer: Self-pay

## 2019-04-03 NOTE — Telephone Encounter (Signed)
VM left at [facility] to make them aware that this patient is no longer under this Palliative Care. Call back information given

## 2019-04-03 NOTE — Telephone Encounter (Signed)
Received message to call Thurmond Butts with [employer] as patient's grandson is in the [employer]. Patient was d/c from Palliative care and admitted to a hospice agency and into a [place] facility. Unsure of specific hospice or of [place] facility. Phone call placed to daughter, Jeannene Patella, to inquire of details to update [employer]. VM left.

## 2019-04-03 NOTE — Telephone Encounter (Signed)
Confirmed with Dwayne Terry at [facility] that patient is under their services. [employer] Information given to [place] to follow up

## 2019-04-30 DEATH — deceased

## 2020-10-28 IMAGING — DX PORTABLE CHEST - 1 VIEW
1 series · 1 of 1 positions shown · non-contrast
Comparison: [DATE] [DATE], [DATE]; [DATE] [DATE], [DATE]

CLINICAL DATA: Weakness and weight loss. History of multiple
myeloma

EXAM:
PORTABLE CHEST 1 VIEW

[chest ap]
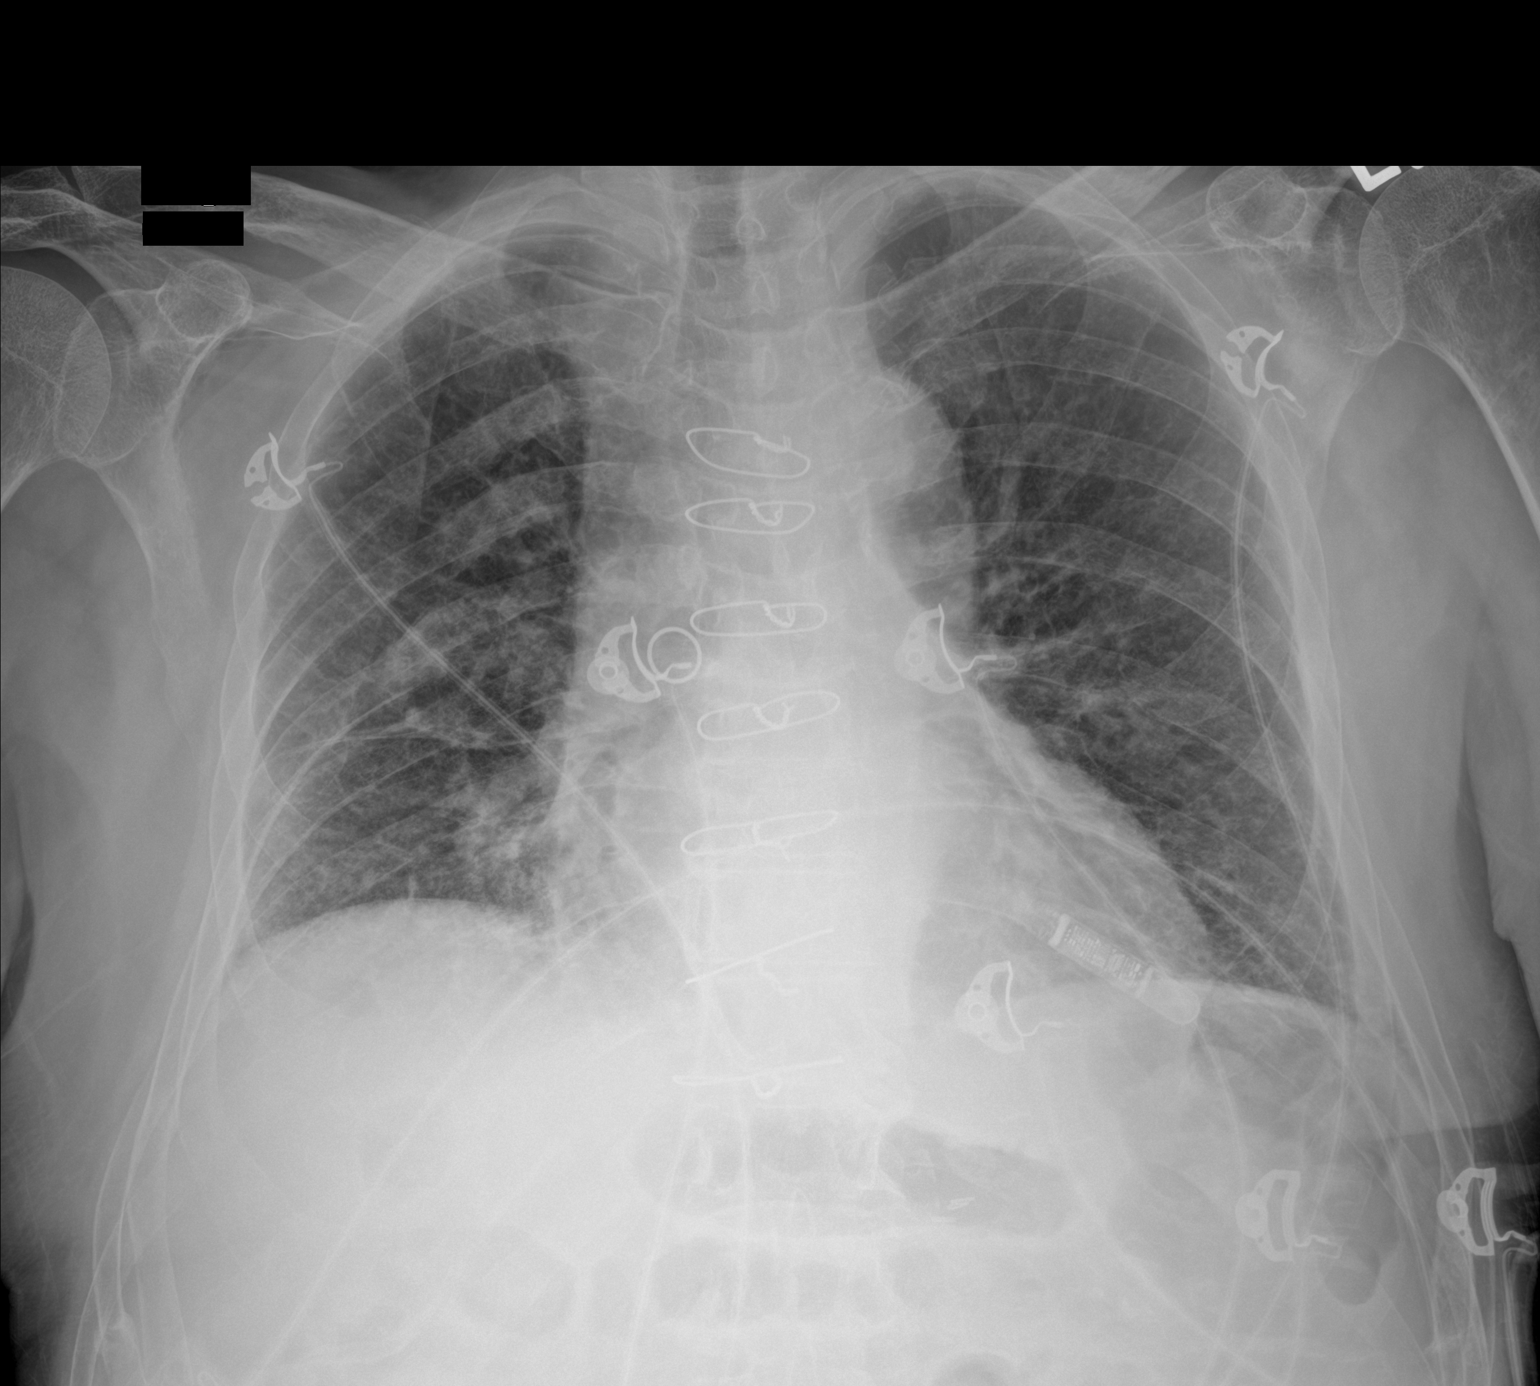

[1 of 1 positions shown; findings below may reference images not displayed]

FINDINGS: There is no frank edema or consolidation. There is interstitial
thickening bilaterally, similar to recent prior study. Heart is
upper normal in size with pulmonary vascularity normal. There is
aortic atherosclerosis. Patient is status post coronary artery
bypass grafting. No focal bone lesions evident. A loop recorder is
noted on the left, unchanged in position. No evident adenopathy.
IMPRESSION: Stable interstitial thickening which may represent chronic
inflammatory type change. No new opacity is evident compared to most
recent study. No frank consolidation. Stable cardiac silhouette.
Patient is status post coronary artery bypass grafting. Aortic
Atherosclerosis ([id number]).
